# Patient Record
Sex: Female | Born: 1935 | Race: White | Hispanic: No | Smoking: Never smoker
Health system: Southern US, Community
[De-identification: ages and names within clinical notes are randomized; demographics above are authoritative.]

## PROBLEM LIST (undated history)

## (undated) DIAGNOSIS — I10 Essential (primary) hypertension: Secondary | ICD-10-CM

## (undated) DIAGNOSIS — L03116 Cellulitis of left lower limb: Secondary | ICD-10-CM

## (undated) DIAGNOSIS — C3490 Malignant neoplasm of unspecified part of unspecified bronchus or lung: Secondary | ICD-10-CM

## (undated) DIAGNOSIS — C801 Malignant (primary) neoplasm, unspecified: Secondary | ICD-10-CM

## (undated) HISTORY — DX: Essential (primary) hypertension: I10

## (undated) HISTORY — DX: Cellulitis of left lower limb: L03.116

## (undated) HISTORY — PX: OTHER SURGICAL HISTORY: SHX169

## (undated) HISTORY — DX: Malignant neoplasm of unspecified part of unspecified bronchus or lung: C34.90

## (undated) HISTORY — PX: CHOLECYSTECTOMY: SHX55

## (undated) HISTORY — PX: BREAST SURGERY: SHX581

## (undated) HISTORY — PX: ABDOMINAL HYSTERECTOMY: SHX81

---

## 1998-02-12 ENCOUNTER — Other Ambulatory Visit: Admission: RE | Admit: 1998-02-12 | Discharge: 1998-02-12 | Payer: Self-pay | Admitting: Gynecology

## 1998-08-17 ENCOUNTER — Ambulatory Visit (HOSPITAL_COMMUNITY): Admission: RE | Admit: 1998-08-17 | Discharge: 1998-08-17 | Payer: Self-pay | Admitting: Gastroenterology

## 1999-02-23 ENCOUNTER — Other Ambulatory Visit: Admission: RE | Admit: 1999-02-23 | Discharge: 1999-02-23 | Payer: Self-pay | Admitting: Gynecology

## 2000-03-09 ENCOUNTER — Other Ambulatory Visit: Admission: RE | Admit: 2000-03-09 | Discharge: 2000-03-09 | Payer: Self-pay | Admitting: Gynecology

## 2001-03-22 ENCOUNTER — Other Ambulatory Visit: Admission: RE | Admit: 2001-03-22 | Discharge: 2001-03-22 | Payer: Self-pay | Admitting: Gynecology

## 2002-03-25 ENCOUNTER — Other Ambulatory Visit: Admission: RE | Admit: 2002-03-25 | Discharge: 2002-03-25 | Payer: Self-pay | Admitting: Gynecology

## 2002-05-14 ENCOUNTER — Encounter: Admission: RE | Admit: 2002-05-14 | Discharge: 2002-05-14 | Payer: Self-pay | Admitting: Orthopedic Surgery

## 2002-05-14 ENCOUNTER — Encounter: Payer: Self-pay | Admitting: Orthopedic Surgery

## 2003-11-06 ENCOUNTER — Encounter (INDEPENDENT_AMBULATORY_CARE_PROVIDER_SITE_OTHER): Payer: Self-pay | Admitting: Specialist

## 2003-11-06 ENCOUNTER — Ambulatory Visit (HOSPITAL_COMMUNITY): Admission: RE | Admit: 2003-11-06 | Discharge: 2003-11-06 | Payer: Self-pay | Admitting: Gastroenterology

## 2004-03-29 ENCOUNTER — Other Ambulatory Visit: Admission: RE | Admit: 2004-03-29 | Discharge: 2004-03-29 | Payer: Self-pay | Admitting: Gynecology

## 2004-04-26 ENCOUNTER — Encounter: Admission: RE | Admit: 2004-04-26 | Discharge: 2004-05-25 | Payer: Self-pay | Admitting: Orthopedic Surgery

## 2004-07-15 ENCOUNTER — Emergency Department (HOSPITAL_COMMUNITY): Admission: EM | Admit: 2004-07-15 | Discharge: 2004-07-15 | Payer: Self-pay | Admitting: Emergency Medicine

## 2004-07-24 ENCOUNTER — Encounter: Admission: RE | Admit: 2004-07-24 | Discharge: 2004-07-24 | Payer: Self-pay | Admitting: Neurology

## 2004-07-29 ENCOUNTER — Encounter: Admission: RE | Admit: 2004-07-29 | Discharge: 2004-07-29 | Payer: Self-pay | Admitting: Neurology

## 2004-08-25 ENCOUNTER — Ambulatory Visit (HOSPITAL_COMMUNITY): Admission: RE | Admit: 2004-08-25 | Discharge: 2004-08-25 | Payer: Self-pay | Admitting: Family Medicine

## 2004-08-26 ENCOUNTER — Ambulatory Visit (HOSPITAL_COMMUNITY): Admission: RE | Admit: 2004-08-26 | Discharge: 2004-08-26 | Payer: Self-pay | Admitting: Interventional Radiology

## 2004-08-26 ENCOUNTER — Encounter (INDEPENDENT_AMBULATORY_CARE_PROVIDER_SITE_OTHER): Payer: Self-pay | Admitting: *Deleted

## 2004-09-02 ENCOUNTER — Ambulatory Visit: Admission: RE | Admit: 2004-09-02 | Discharge: 2004-09-24 | Payer: Self-pay | Admitting: Radiation Oncology

## 2004-09-02 ENCOUNTER — Ambulatory Visit: Payer: Self-pay | Admitting: Internal Medicine

## 2004-09-07 ENCOUNTER — Ambulatory Visit (HOSPITAL_COMMUNITY): Admission: RE | Admit: 2004-09-07 | Discharge: 2004-09-07 | Payer: Self-pay | Admitting: Internal Medicine

## 2004-10-07 ENCOUNTER — Ambulatory Visit (HOSPITAL_COMMUNITY): Admission: RE | Admit: 2004-10-07 | Discharge: 2004-10-07 | Payer: Self-pay | Admitting: Internal Medicine

## 2004-10-13 ENCOUNTER — Ambulatory Visit: Admission: RE | Admit: 2004-10-13 | Discharge: 2004-10-13 | Payer: Self-pay | Admitting: Radiation Oncology

## 2004-10-18 ENCOUNTER — Encounter (HOSPITAL_COMMUNITY): Admission: RE | Admit: 2004-10-18 | Discharge: 2004-10-18 | Payer: Self-pay | Admitting: Internal Medicine

## 2004-10-18 ENCOUNTER — Ambulatory Visit: Payer: Self-pay | Admitting: Internal Medicine

## 2004-11-10 ENCOUNTER — Ambulatory Visit (HOSPITAL_COMMUNITY): Admission: RE | Admit: 2004-11-10 | Discharge: 2004-11-10 | Payer: Self-pay | Admitting: Internal Medicine

## 2004-12-08 ENCOUNTER — Ambulatory Visit (HOSPITAL_COMMUNITY): Admission: RE | Admit: 2004-12-08 | Discharge: 2004-12-08 | Payer: Self-pay | Admitting: Internal Medicine

## 2004-12-08 ENCOUNTER — Ambulatory Visit: Payer: Self-pay | Admitting: Internal Medicine

## 2005-01-20 ENCOUNTER — Ambulatory Visit (HOSPITAL_COMMUNITY): Admission: RE | Admit: 2005-01-20 | Discharge: 2005-01-20 | Payer: Self-pay | Admitting: Internal Medicine

## 2005-01-26 ENCOUNTER — Ambulatory Visit: Payer: Self-pay | Admitting: Internal Medicine

## 2005-03-15 ENCOUNTER — Ambulatory Visit: Payer: Self-pay | Admitting: Internal Medicine

## 2005-03-17 ENCOUNTER — Ambulatory Visit (HOSPITAL_COMMUNITY): Admission: RE | Admit: 2005-03-17 | Discharge: 2005-03-17 | Payer: Self-pay | Admitting: Internal Medicine

## 2005-05-04 ENCOUNTER — Ambulatory Visit: Payer: Self-pay | Admitting: Internal Medicine

## 2005-06-17 ENCOUNTER — Ambulatory Visit (HOSPITAL_COMMUNITY): Admission: RE | Admit: 2005-06-17 | Discharge: 2005-06-17 | Payer: Self-pay | Admitting: Internal Medicine

## 2005-06-22 ENCOUNTER — Ambulatory Visit: Payer: Self-pay | Admitting: Internal Medicine

## 2005-08-25 ENCOUNTER — Ambulatory Visit: Payer: Self-pay | Admitting: Internal Medicine

## 2005-09-16 ENCOUNTER — Ambulatory Visit (HOSPITAL_COMMUNITY): Admission: RE | Admit: 2005-09-16 | Discharge: 2005-09-16 | Payer: Self-pay | Admitting: Internal Medicine

## 2005-09-22 LAB — BASIC METABOLIC PANEL
CO2: 28 mEq/L (ref 19–32)
Calcium: 8.5 mg/dL (ref 8.4–10.5)
Chloride: 105 mEq/L (ref 96–112)
Creatinine, Ser: 0.8 mg/dL (ref 0.4–1.2)
Glucose, Bld: 89 mg/dL (ref 70–99)

## 2005-09-22 LAB — CBC WITH DIFFERENTIAL/PLATELET
BASO%: 0.5 % (ref 0.0–2.0)
Eosinophils Absolute: 0 10*3/uL (ref 0.0–0.5)
HCT: 28.5 % — ABNORMAL LOW (ref 34.8–46.6)
HGB: 10.2 g/dL — ABNORMAL LOW (ref 11.6–15.9)
LYMPH%: 20.7 % (ref 14.0–48.0)
MCHC: 35.7 g/dL (ref 32.0–36.0)
MONO#: 0.4 10*3/uL (ref 0.1–0.9)
NEUT#: 2.9 10*3/uL (ref 1.5–6.5)
NEUT%: 68.3 % (ref 39.6–76.8)
Platelets: 210 10*3/uL (ref 145–400)
WBC: 4.3 10*3/uL (ref 3.9–10.0)
lymph#: 0.9 10*3/uL (ref 0.9–3.3)

## 2005-10-06 LAB — CBC WITH DIFFERENTIAL/PLATELET
Eosinophils Absolute: 0.1 10*3/uL (ref 0.0–0.5)
MONO#: 0.6 10*3/uL (ref 0.1–0.9)
MONO%: 8.7 % (ref 0.0–13.0)
NEUT#: 5 10*3/uL (ref 1.5–6.5)
RBC: 3.11 10*6/uL — ABNORMAL LOW (ref 3.70–5.32)
RDW: 15.8 % — ABNORMAL HIGH (ref 11.3–14.5)
WBC: 6.4 10*3/uL (ref 3.9–10.0)
lymph#: 0.8 10*3/uL — ABNORMAL LOW (ref 0.9–3.3)

## 2005-10-18 ENCOUNTER — Ambulatory Visit: Payer: Self-pay | Admitting: Internal Medicine

## 2005-10-20 LAB — CBC WITH DIFFERENTIAL/PLATELET
BASO%: 0.7 % (ref 0.0–2.0)
EOS%: 1.7 % (ref 0.0–7.0)
LYMPH%: 14.2 % (ref 14.0–48.0)
MCH: 33.9 pg (ref 26.0–34.0)
MCHC: 35.2 g/dL (ref 32.0–36.0)
MCV: 96.2 fL (ref 81.0–101.0)
MONO#: 0.5 10*3/uL (ref 0.1–0.9)
MONO%: 7.6 % (ref 0.0–13.0)
NEUT%: 75.8 % (ref 39.6–76.8)
Platelets: 201 10*3/uL (ref 145–400)
RBC: 3.9 10*6/uL (ref 3.70–5.32)
WBC: 6 10*3/uL (ref 3.9–10.0)

## 2005-10-20 LAB — COMPREHENSIVE METABOLIC PANEL
CO2: 23 mEq/L (ref 19–32)
Calcium: 8.6 mg/dL (ref 8.4–10.5)
Chloride: 107 mEq/L (ref 96–112)
Creatinine, Ser: 0.8 mg/dL (ref 0.4–1.2)
Glucose, Bld: 128 mg/dL — ABNORMAL HIGH (ref 70–99)
Total Bilirubin: 1 mg/dL (ref 0.3–1.2)

## 2005-11-03 LAB — CBC WITH DIFFERENTIAL/PLATELET
Eosinophils Absolute: 0.1 10*3/uL (ref 0.0–0.5)
HCT: 34.6 % — ABNORMAL LOW (ref 34.8–46.6)
LYMPH%: 14.6 % (ref 14.0–48.0)
MONO#: 0.4 10*3/uL (ref 0.1–0.9)
NEUT#: 4.9 10*3/uL (ref 1.5–6.5)
NEUT%: 76.6 % (ref 39.6–76.8)
Platelets: 213 10*3/uL (ref 145–400)
RBC: 3.63 10*6/uL — ABNORMAL LOW (ref 3.70–5.32)
WBC: 6.4 10*3/uL (ref 3.9–10.0)
lymph#: 0.9 10*3/uL (ref 0.9–3.3)

## 2005-11-17 LAB — CBC WITH DIFFERENTIAL/PLATELET
BASO%: 0.3 % (ref 0.0–2.0)
Basophils Absolute: 0 10*3/uL (ref 0.0–0.1)
HCT: 34.9 % (ref 34.8–46.6)
HGB: 11.7 g/dL (ref 11.6–15.9)
LYMPH%: 14.2 % (ref 14.0–48.0)
MCHC: 33.6 g/dL (ref 32.0–36.0)
MONO#: 0.5 10*3/uL (ref 0.1–0.9)
NEUT%: 72.7 % (ref 39.6–76.8)
Platelets: 224 10*3/uL (ref 145–400)
WBC: 5.1 10*3/uL (ref 3.9–10.0)
lymph#: 0.7 10*3/uL — ABNORMAL LOW (ref 0.9–3.3)

## 2005-11-17 LAB — COMPREHENSIVE METABOLIC PANEL
ALT: 11 U/L (ref 0–40)
BUN: 18 mg/dL (ref 6–23)
CO2: 27 mEq/L (ref 19–32)
Calcium: 8.4 mg/dL (ref 8.4–10.5)
Chloride: 105 mEq/L (ref 96–112)
Creatinine, Ser: 0.9 mg/dL (ref 0.4–1.2)
Glucose, Bld: 85 mg/dL (ref 70–99)
Total Bilirubin: 1.1 mg/dL (ref 0.3–1.2)

## 2005-12-01 LAB — CBC WITH DIFFERENTIAL/PLATELET
Basophils Absolute: 0 10*3/uL (ref 0.0–0.1)
Eosinophils Absolute: 0.1 10*3/uL (ref 0.0–0.5)
HGB: 12.9 g/dL (ref 11.6–15.9)
NEUT#: 4.2 10*3/uL (ref 1.5–6.5)
RDW: 14.7 % — ABNORMAL HIGH (ref 11.3–14.5)
WBC: 5.6 10*3/uL (ref 3.9–10.0)
lymph#: 0.9 10*3/uL (ref 0.9–3.3)

## 2005-12-12 ENCOUNTER — Ambulatory Visit: Payer: Self-pay | Admitting: Internal Medicine

## 2005-12-15 ENCOUNTER — Ambulatory Visit: Admission: RE | Admit: 2005-12-15 | Discharge: 2005-12-15 | Payer: Self-pay | Admitting: Internal Medicine

## 2005-12-15 LAB — CBC WITH DIFFERENTIAL/PLATELET
Basophils Absolute: 0.1 10*3/uL (ref 0.0–0.1)
Eosinophils Absolute: 0.1 10*3/uL (ref 0.0–0.5)
HGB: 12.1 g/dL (ref 11.6–15.9)
LYMPH%: 18.3 % (ref 14.0–48.0)
MCV: 95.6 fL (ref 81.0–101.0)
MONO#: 0.6 10*3/uL (ref 0.1–0.9)
MONO%: 11 % (ref 0.0–13.0)
NEUT#: 3.5 10*3/uL (ref 1.5–6.5)
Platelets: 155 10*3/uL (ref 145–400)
RDW: 13.4 % (ref 11.3–14.5)

## 2005-12-15 LAB — BASIC METABOLIC PANEL
BUN: 18 mg/dL (ref 6–23)
CO2: 27 mEq/L (ref 19–32)
Glucose, Bld: 80 mg/dL (ref 70–99)
Potassium: 4.1 mEq/L (ref 3.5–5.3)

## 2005-12-19 LAB — CBC WITH DIFFERENTIAL/PLATELET
Basophils Absolute: 0 10*3/uL (ref 0.0–0.1)
EOS%: 1.9 % (ref 0.0–7.0)
Eosinophils Absolute: 0.1 10*3/uL (ref 0.0–0.5)
LYMPH%: 15 % (ref 14.0–48.0)
MCH: 33.3 pg (ref 26.0–34.0)
MCV: 96 fL (ref 81.0–101.0)
MONO%: 8.3 % (ref 0.0–13.0)
NEUT#: 4.2 10*3/uL (ref 1.5–6.5)
Platelets: 201 10*3/uL (ref 145–400)
RBC: 3.53 10*6/uL — ABNORMAL LOW (ref 3.70–5.32)
RDW: 15.1 % — ABNORMAL HIGH (ref 11.3–14.5)

## 2005-12-19 LAB — COMPREHENSIVE METABOLIC PANEL
AST: 17 U/L (ref 0–37)
Alkaline Phosphatase: 63 U/L (ref 39–117)
BUN: 15 mg/dL (ref 6–23)
Glucose, Bld: 100 mg/dL — ABNORMAL HIGH (ref 70–99)
Potassium: 5.2 mEq/L (ref 3.5–5.3)
Sodium: 142 mEq/L (ref 135–145)
Total Bilirubin: 0.9 mg/dL (ref 0.3–1.2)

## 2006-01-12 LAB — CBC WITH DIFFERENTIAL/PLATELET
Basophils Absolute: 0 10*3/uL (ref 0.0–0.1)
EOS%: 1.7 % (ref 0.0–7.0)
Eosinophils Absolute: 0.1 10*3/uL (ref 0.0–0.5)
HGB: 10.4 g/dL — ABNORMAL LOW (ref 11.6–15.9)
LYMPH%: 14.6 % (ref 14.0–48.0)
MCH: 32.5 pg (ref 26.0–34.0)
MCV: 94.8 fL (ref 81.0–101.0)
MONO%: 7.1 % (ref 0.0–13.0)
NEUT#: 4.3 10*3/uL (ref 1.5–6.5)
NEUT%: 76.5 % (ref 39.6–76.8)
Platelets: 209 10*3/uL (ref 145–400)

## 2006-01-12 LAB — COMPREHENSIVE METABOLIC PANEL
Albumin: 3.9 g/dL (ref 3.5–5.2)
BUN: 21 mg/dL (ref 6–23)
Calcium: 8.6 mg/dL (ref 8.4–10.5)
Chloride: 106 mEq/L (ref 96–112)
Glucose, Bld: 103 mg/dL — ABNORMAL HIGH (ref 70–99)
Potassium: 3.5 mEq/L (ref 3.5–5.3)

## 2006-01-24 ENCOUNTER — Ambulatory Visit: Payer: Self-pay | Admitting: Internal Medicine

## 2006-02-09 LAB — CBC WITH DIFFERENTIAL/PLATELET
BASO%: 0.1 % (ref 0.0–2.0)
Basophils Absolute: 0 10*3/uL (ref 0.0–0.1)
EOS%: 1.5 % (ref 0.0–7.0)
HCT: 28.6 % — ABNORMAL LOW (ref 34.8–46.6)
HGB: 9.8 g/dL — ABNORMAL LOW (ref 11.6–15.9)
LYMPH%: 15.8 % (ref 14.0–48.0)
MCH: 33.4 pg (ref 26.0–34.0)
MCHC: 34.5 g/dL (ref 32.0–36.0)
MONO#: 0.4 10*3/uL (ref 0.1–0.9)
NEUT%: 75.1 % (ref 39.6–76.8)
Platelets: 231 10*3/uL (ref 145–400)

## 2006-02-09 LAB — COMPREHENSIVE METABOLIC PANEL
ALT: 12 U/L (ref 0–40)
Albumin: 4 g/dL (ref 3.5–5.2)
Alkaline Phosphatase: 53 U/L (ref 39–117)
Glucose, Bld: 95 mg/dL (ref 70–99)
Potassium: 3.9 mEq/L (ref 3.5–5.3)
Sodium: 142 mEq/L (ref 135–145)
Total Bilirubin: 0.9 mg/dL (ref 0.3–1.2)
Total Protein: 6.4 g/dL (ref 6.0–8.3)

## 2006-03-02 ENCOUNTER — Ambulatory Visit (HOSPITAL_COMMUNITY): Admission: RE | Admit: 2006-03-02 | Discharge: 2006-03-02 | Payer: Self-pay | Admitting: Internal Medicine

## 2006-03-09 ENCOUNTER — Ambulatory Visit: Payer: Self-pay | Admitting: Internal Medicine

## 2006-03-09 LAB — CBC WITH DIFFERENTIAL/PLATELET
BASO%: 0.1 % (ref 0.0–2.0)
EOS%: 1.9 % (ref 0.0–7.0)
LYMPH%: 13 % — ABNORMAL LOW (ref 14.0–48.0)
MCH: 34 pg (ref 26.0–34.0)
MCHC: 34.7 g/dL (ref 32.0–36.0)
MCV: 98.1 fL (ref 81.0–101.0)
MONO%: 7.5 % (ref 0.0–13.0)
NEUT#: 4.7 10*3/uL (ref 1.5–6.5)
Platelets: 227 10*3/uL (ref 145–400)
RBC: 2.85 10*6/uL — ABNORMAL LOW (ref 3.70–5.32)
RDW: 14.8 % — ABNORMAL HIGH (ref 11.3–14.5)

## 2006-03-09 LAB — COMPREHENSIVE METABOLIC PANEL
AST: 16 U/L (ref 0–37)
Albumin: 4 g/dL (ref 3.5–5.2)
Alkaline Phosphatase: 52 U/L (ref 39–117)
Potassium: 3.6 mEq/L (ref 3.5–5.3)
Sodium: 142 mEq/L (ref 135–145)
Total Bilirubin: 0.7 mg/dL (ref 0.3–1.2)
Total Protein: 6.5 g/dL (ref 6.0–8.3)

## 2006-04-06 LAB — CBC WITH DIFFERENTIAL/PLATELET
BASO%: 0.2 % (ref 0.0–2.0)
EOS%: 1.1 % (ref 0.0–7.0)
HCT: 34.3 % — ABNORMAL LOW (ref 34.8–46.6)
LYMPH%: 11.4 % — ABNORMAL LOW (ref 14.0–48.0)
MCH: 33.6 pg (ref 26.0–34.0)
MCHC: 33.6 g/dL (ref 32.0–36.0)
MCV: 99.9 fL (ref 81.0–101.0)
MONO%: 6.5 % (ref 0.0–13.0)
NEUT%: 80.8 % — ABNORMAL HIGH (ref 39.6–76.8)
Platelets: 234 10*3/uL (ref 145–400)
lymph#: 0.8 10*3/uL — ABNORMAL LOW (ref 0.9–3.3)

## 2006-04-06 LAB — COMPREHENSIVE METABOLIC PANEL
ALT: 9 U/L (ref 0–40)
AST: 15 U/L (ref 0–37)
Alkaline Phosphatase: 67 U/L (ref 39–117)
Creatinine, Ser: 0.85 mg/dL (ref 0.40–1.20)
Total Bilirubin: 1 mg/dL (ref 0.3–1.2)

## 2006-04-18 ENCOUNTER — Ambulatory Visit: Payer: Self-pay | Admitting: Internal Medicine

## 2006-04-20 LAB — CBC WITH DIFFERENTIAL/PLATELET
Basophils Absolute: 0 10*3/uL (ref 0.0–0.1)
Eosinophils Absolute: 0.1 10*3/uL (ref 0.0–0.5)
HCT: 37.7 % (ref 34.8–46.6)
HGB: 12.9 g/dL (ref 11.6–15.9)
LYMPH%: 13 % — ABNORMAL LOW (ref 14.0–48.0)
MCH: 32.8 pg (ref 26.0–34.0)
MCV: 96.2 fL (ref 81.0–101.0)
MONO%: 7.3 % (ref 0.0–13.0)
NEUT#: 4.6 10*3/uL (ref 1.5–6.5)
NEUT%: 76.8 % (ref 39.6–76.8)
Platelets: 181 10*3/uL (ref 145–400)
RDW: 13.7 % (ref 11.3–14.5)

## 2006-05-03 LAB — CBC WITH DIFFERENTIAL/PLATELET
BASO%: 0.1 % (ref 0.0–2.0)
Basophils Absolute: 0 10*3/uL (ref 0.0–0.1)
EOS%: 1.3 % (ref 0.0–7.0)
Eosinophils Absolute: 0.1 10*3/uL (ref 0.0–0.5)
HCT: 36.5 % (ref 34.8–46.6)
HGB: 12.4 g/dL (ref 11.6–15.9)
LYMPH%: 12.5 % — ABNORMAL LOW (ref 14.0–48.0)
MCH: 33.2 pg (ref 26.0–34.0)
MCHC: 34.1 g/dL (ref 32.0–36.0)
MCV: 97.3 fL (ref 81.0–101.0)
MONO#: 0.5 10*3/uL (ref 0.1–0.9)
MONO%: 7 % (ref 0.0–13.0)
NEUT#: 5.4 10*3/uL (ref 1.5–6.5)
NEUT%: 79.1 % — ABNORMAL HIGH (ref 39.6–76.8)
Platelets: 196 10*3/uL (ref 145–400)
RBC: 3.75 10*6/uL (ref 3.70–5.32)
RDW: 14.8 % — ABNORMAL HIGH (ref 11.3–14.5)
WBC: 6.8 10*3/uL (ref 3.9–10.0)
lymph#: 0.8 10*3/uL — ABNORMAL LOW (ref 0.9–3.3)

## 2006-05-03 LAB — URINALYSIS, MICROSCOPIC - CHCC
Bilirubin (Urine): NEGATIVE
Blood: NEGATIVE
Glucose: NEGATIVE g/dL
Ketones: NEGATIVE mg/dL
Nitrite: NEGATIVE
Protein: NEGATIVE mg/dL
RBC count: NEGATIVE (ref 0–2)
Specific Gravity, Urine: 1.005 (ref 1.003–1.035)
pH: 6 (ref 4.6–8.0)

## 2006-05-03 LAB — COMPREHENSIVE METABOLIC PANEL
ALT: 10 U/L (ref 0–35)
AST: 17 U/L (ref 0–37)
Albumin: 3.9 g/dL (ref 3.5–5.2)
Alkaline Phosphatase: 75 U/L (ref 39–117)
BUN: 12 mg/dL (ref 6–23)
CO2: 29 mEq/L (ref 19–32)
Calcium: 8.7 mg/dL (ref 8.4–10.5)
Chloride: 103 mEq/L (ref 96–112)
Creatinine, Ser: 0.8 mg/dL (ref 0.40–1.20)
Glucose, Bld: 101 mg/dL — ABNORMAL HIGH (ref 70–99)
Potassium: 3.7 mEq/L (ref 3.5–5.3)
Sodium: 140 mEq/L (ref 135–145)
Total Bilirubin: 0.9 mg/dL (ref 0.3–1.2)
Total Protein: 6.6 g/dL (ref 6.0–8.3)

## 2006-05-18 LAB — CBC WITH DIFFERENTIAL/PLATELET
BASO%: 1.2 % (ref 0.0–2.0)
HCT: 35.9 % (ref 34.8–46.6)
LYMPH%: 16.1 % (ref 14.0–48.0)
MCHC: 34.3 g/dL (ref 32.0–36.0)
MONO#: 0.6 10*3/uL (ref 0.1–0.9)
NEUT%: 71.6 % (ref 39.6–76.8)
Platelets: 166 10*3/uL (ref 145–400)
WBC: 6.6 10*3/uL (ref 3.9–10.0)

## 2006-06-02 ENCOUNTER — Ambulatory Visit: Payer: Self-pay | Admitting: Internal Medicine

## 2006-06-05 ENCOUNTER — Ambulatory Visit (HOSPITAL_COMMUNITY): Admission: RE | Admit: 2006-06-05 | Discharge: 2006-06-05 | Payer: Self-pay | Admitting: Internal Medicine

## 2006-06-07 LAB — COMPREHENSIVE METABOLIC PANEL
AST: 16 U/L (ref 0–37)
Alkaline Phosphatase: 72 U/L (ref 39–117)
BUN: 14 mg/dL (ref 6–23)
Creatinine, Ser: 0.82 mg/dL (ref 0.40–1.20)
Glucose, Bld: 105 mg/dL — ABNORMAL HIGH (ref 70–99)
Total Bilirubin: 0.6 mg/dL (ref 0.3–1.2)

## 2006-06-07 LAB — CBC WITH DIFFERENTIAL/PLATELET
Basophils Absolute: 0 10*3/uL (ref 0.0–0.1)
EOS%: 1.6 % (ref 0.0–7.0)
Eosinophils Absolute: 0.1 10*3/uL (ref 0.0–0.5)
HGB: 10.6 g/dL — ABNORMAL LOW (ref 11.6–15.9)
LYMPH%: 17 % (ref 14.0–48.0)
MCH: 32.3 pg (ref 26.0–34.0)
MCV: 95.2 fL (ref 81.0–101.0)
MONO%: 7.5 % (ref 0.0–13.0)
NEUT#: 3.7 10*3/uL (ref 1.5–6.5)
Platelets: 231 10*3/uL (ref 145–400)
RDW: 14.9 % — ABNORMAL HIGH (ref 11.3–14.5)

## 2006-07-05 LAB — COMPREHENSIVE METABOLIC PANEL
Albumin: 4.3 g/dL (ref 3.5–5.2)
CO2: 28 mEq/L (ref 19–32)
Calcium: 8.8 mg/dL (ref 8.4–10.5)
Glucose, Bld: 94 mg/dL (ref 70–99)
Sodium: 143 mEq/L (ref 135–145)
Total Bilirubin: 1.2 mg/dL (ref 0.3–1.2)
Total Protein: 6.9 g/dL (ref 6.0–8.3)

## 2006-07-05 LAB — CBC WITH DIFFERENTIAL/PLATELET
Eosinophils Absolute: 0.1 10*3/uL (ref 0.0–0.5)
HCT: 31.9 % — ABNORMAL LOW (ref 34.8–46.6)
LYMPH%: 14.7 % (ref 14.0–48.0)
MONO#: 0.5 10*3/uL (ref 0.1–0.9)
NEUT#: 4.2 10*3/uL (ref 1.5–6.5)
Platelets: 233 10*3/uL (ref 145–400)
RBC: 3.33 10*6/uL — ABNORMAL LOW (ref 3.70–5.32)
WBC: 5.6 10*3/uL (ref 3.9–10.0)

## 2006-07-18 ENCOUNTER — Ambulatory Visit: Payer: Self-pay | Admitting: Internal Medicine

## 2006-08-09 LAB — CBC WITH DIFFERENTIAL/PLATELET
Eosinophils Absolute: 0.1 10*3/uL (ref 0.0–0.5)
MONO#: 0.4 10*3/uL (ref 0.1–0.9)
NEUT#: 4.1 10*3/uL (ref 1.5–6.5)
Platelets: 225 10*3/uL (ref 145–400)
RBC: 3.11 10*6/uL — ABNORMAL LOW (ref 3.70–5.32)
RDW: 14.9 % — ABNORMAL HIGH (ref 11.3–14.5)
WBC: 5.5 10*3/uL (ref 3.9–10.0)
lymph#: 0.9 10*3/uL (ref 0.9–3.3)

## 2006-08-09 LAB — COMPREHENSIVE METABOLIC PANEL
ALT: 14 U/L (ref 0–35)
Albumin: 4.1 g/dL (ref 3.5–5.2)
CO2: 28 mEq/L (ref 19–32)
Chloride: 108 mEq/L (ref 96–112)
Glucose, Bld: 95 mg/dL (ref 70–99)
Potassium: 3.2 mEq/L — ABNORMAL LOW (ref 3.5–5.3)
Sodium: 145 mEq/L (ref 135–145)
Total Protein: 6.7 g/dL (ref 6.0–8.3)

## 2006-08-22 ENCOUNTER — Ambulatory Visit (HOSPITAL_COMMUNITY): Admission: RE | Admit: 2006-08-22 | Discharge: 2006-08-22 | Payer: Self-pay | Admitting: Internal Medicine

## 2006-08-24 LAB — COMPREHENSIVE METABOLIC PANEL
ALT: 11 U/L (ref 0–35)
AST: 16 U/L (ref 0–37)
CO2: 26 mEq/L (ref 19–32)
Chloride: 106 mEq/L (ref 96–112)
Sodium: 143 mEq/L (ref 135–145)
Total Bilirubin: 0.9 mg/dL (ref 0.3–1.2)
Total Protein: 7 g/dL (ref 6.0–8.3)

## 2006-08-24 LAB — CBC WITH DIFFERENTIAL/PLATELET
BASO%: 0.4 % (ref 0.0–2.0)
LYMPH%: 17.1 % (ref 14.0–48.0)
MCHC: 35.2 g/dL (ref 32.0–36.0)
MONO#: 0.4 10*3/uL (ref 0.1–0.9)
RBC: 3.18 10*6/uL — ABNORMAL LOW (ref 3.70–5.32)
WBC: 5.1 10*3/uL (ref 3.9–10.0)
lymph#: 0.9 10*3/uL (ref 0.9–3.3)

## 2006-09-18 ENCOUNTER — Ambulatory Visit: Payer: Self-pay | Admitting: Internal Medicine

## 2006-09-21 LAB — CBC WITH DIFFERENTIAL/PLATELET
BASO%: 0.3 % (ref 0.0–2.0)
Basophils Absolute: 0 10*3/uL (ref 0.0–0.1)
EOS%: 2.5 % (ref 0.0–7.0)
Eosinophils Absolute: 0.1 10*3/uL (ref 0.0–0.5)
HCT: 31 % — ABNORMAL LOW (ref 34.8–46.6)
HGB: 10.9 g/dL — ABNORMAL LOW (ref 11.6–15.9)
LYMPH%: 22.8 % (ref 14.0–48.0)
MCH: 34 pg (ref 26.0–34.0)
MCHC: 35.3 g/dL (ref 32.0–36.0)
MCV: 96.4 fL (ref 81.0–101.0)
MONO#: 0.4 10*3/uL (ref 0.1–0.9)
MONO%: 9.2 % (ref 0.0–13.0)
NEUT#: 3 10*3/uL (ref 1.5–6.5)
NEUT%: 65.2 % (ref 39.6–76.8)
Platelets: 232 10*3/uL (ref 145–400)
RBC: 3.22 10*6/uL — ABNORMAL LOW (ref 3.70–5.32)
RDW: 13.5 % (ref 11.3–14.5)
WBC: 4.5 10*3/uL (ref 3.9–10.0)
lymph#: 1 10*3/uL (ref 0.9–3.3)

## 2006-09-21 LAB — COMPREHENSIVE METABOLIC PANEL
ALT: 12 U/L (ref 0–35)
AST: 16 U/L (ref 0–37)
Albumin: 3.9 g/dL (ref 3.5–5.2)
Alkaline Phosphatase: 72 U/L (ref 39–117)
BUN: 23 mg/dL (ref 6–23)
CO2: 26 mEq/L (ref 19–32)
Calcium: 8.4 mg/dL (ref 8.4–10.5)
Chloride: 106 mEq/L (ref 96–112)
Creatinine, Ser: 0.89 mg/dL (ref 0.40–1.20)
Glucose, Bld: 90 mg/dL (ref 70–99)
Potassium: 3.4 mEq/L — ABNORMAL LOW (ref 3.5–5.3)
Sodium: 142 mEq/L (ref 135–145)
Total Bilirubin: 0.7 mg/dL (ref 0.3–1.2)
Total Protein: 6.5 g/dL (ref 6.0–8.3)

## 2006-10-19 LAB — CBC WITH DIFFERENTIAL/PLATELET
BASO%: 0.2 % (ref 0.0–2.0)
HCT: 31.1 % — ABNORMAL LOW (ref 34.8–46.6)
LYMPH%: 17.8 % (ref 14.0–48.0)
MCHC: 35.2 g/dL (ref 32.0–36.0)
MCV: 96.5 fL (ref 81.0–101.0)
MONO%: 7.6 % (ref 0.0–13.0)
NEUT%: 72.1 % (ref 39.6–76.8)
Platelets: 224 10*3/uL (ref 145–400)
RBC: 3.23 10*6/uL — ABNORMAL LOW (ref 3.70–5.32)

## 2006-10-19 LAB — COMPREHENSIVE METABOLIC PANEL
ALT: 10 U/L (ref 0–35)
Alkaline Phosphatase: 75 U/L (ref 39–117)
Creatinine, Ser: 0.81 mg/dL (ref 0.40–1.20)
Glucose, Bld: 83 mg/dL (ref 70–99)
Sodium: 143 mEq/L (ref 135–145)
Total Bilirubin: 0.7 mg/dL (ref 0.3–1.2)
Total Protein: 6.5 g/dL (ref 6.0–8.3)

## 2006-11-10 ENCOUNTER — Ambulatory Visit (HOSPITAL_COMMUNITY): Admission: RE | Admit: 2006-11-10 | Discharge: 2006-11-10 | Payer: Self-pay | Admitting: Internal Medicine

## 2006-11-14 ENCOUNTER — Ambulatory Visit: Payer: Self-pay | Admitting: Internal Medicine

## 2006-11-16 LAB — CBC WITH DIFFERENTIAL/PLATELET
BASO%: 0.2 % (ref 0.0–2.0)
EOS%: 1.7 % (ref 0.0–7.0)
HCT: 30.5 % — ABNORMAL LOW (ref 34.8–46.6)
LYMPH%: 15.1 % (ref 14.0–48.0)
MCH: 34 pg (ref 26.0–34.0)
MCHC: 35.2 g/dL (ref 32.0–36.0)
MCV: 96.7 fL (ref 81.0–101.0)
NEUT%: 76.7 % (ref 39.6–76.8)
Platelets: 222 10*3/uL (ref 145–400)

## 2006-11-16 LAB — COMPREHENSIVE METABOLIC PANEL
ALT: 11 U/L (ref 0–35)
AST: 16 U/L (ref 0–37)
CO2: 27 mEq/L (ref 19–32)
Creatinine, Ser: 0.88 mg/dL (ref 0.40–1.20)
Total Bilirubin: 0.7 mg/dL (ref 0.3–1.2)

## 2006-12-14 LAB — COMPREHENSIVE METABOLIC PANEL
ALT: 11 U/L (ref 0–35)
BUN: 18 mg/dL (ref 6–23)
CO2: 26 mEq/L (ref 19–32)
Calcium: 8.5 mg/dL (ref 8.4–10.5)
Creatinine, Ser: 0.79 mg/dL (ref 0.40–1.20)
Glucose, Bld: 108 mg/dL — ABNORMAL HIGH (ref 70–99)
Total Bilirubin: 0.8 mg/dL (ref 0.3–1.2)

## 2006-12-14 LAB — CBC WITH DIFFERENTIAL/PLATELET
BASO%: 0.8 % (ref 0.0–2.0)
Basophils Absolute: 0 10*3/uL (ref 0.0–0.1)
HCT: 31.5 % — ABNORMAL LOW (ref 34.8–46.6)
HGB: 11 g/dL — ABNORMAL LOW (ref 11.6–15.9)
LYMPH%: 16.9 % (ref 14.0–48.0)
MCHC: 34.8 g/dL (ref 32.0–36.0)
MONO#: 0.5 10*3/uL (ref 0.1–0.9)
NEUT%: 71.5 % (ref 39.6–76.8)
Platelets: 208 10*3/uL (ref 145–400)
WBC: 5.7 10*3/uL (ref 3.9–10.0)

## 2007-01-08 ENCOUNTER — Ambulatory Visit: Payer: Self-pay | Admitting: Internal Medicine

## 2007-01-11 LAB — COMPREHENSIVE METABOLIC PANEL
Albumin: 4 g/dL (ref 3.5–5.2)
Alkaline Phosphatase: 74 U/L (ref 39–117)
Calcium: 8.1 mg/dL — ABNORMAL LOW (ref 8.4–10.5)
Chloride: 105 mEq/L (ref 96–112)
Glucose, Bld: 92 mg/dL (ref 70–99)
Potassium: 4.5 mEq/L (ref 3.5–5.3)
Sodium: 139 mEq/L (ref 135–145)
Total Protein: 6.7 g/dL (ref 6.0–8.3)

## 2007-01-11 LAB — CBC WITH DIFFERENTIAL/PLATELET
Basophils Absolute: 0 10*3/uL (ref 0.0–0.1)
Eosinophils Absolute: 0.1 10*3/uL (ref 0.0–0.5)
HCT: 31.2 % — ABNORMAL LOW (ref 34.8–46.6)
HGB: 10.7 g/dL — ABNORMAL LOW (ref 11.6–15.9)
LYMPH%: 16.3 % (ref 14.0–48.0)
MONO#: 0.4 10*3/uL (ref 0.1–0.9)
NEUT#: 4.1 10*3/uL (ref 1.5–6.5)
NEUT%: 73.4 % (ref 39.6–76.8)
Platelets: 198 10*3/uL (ref 145–400)
WBC: 5.6 10*3/uL (ref 3.9–10.0)
lymph#: 0.9 10*3/uL (ref 0.9–3.3)

## 2007-02-05 ENCOUNTER — Ambulatory Visit (HOSPITAL_COMMUNITY): Admission: RE | Admit: 2007-02-05 | Discharge: 2007-02-05 | Payer: Self-pay | Admitting: Internal Medicine

## 2007-02-08 LAB — BASIC METABOLIC PANEL
BUN: 19 mg/dL (ref 6–23)
Creatinine, Ser: 0.78 mg/dL (ref 0.40–1.20)
Potassium: 4.4 mEq/L (ref 3.5–5.3)

## 2007-03-06 ENCOUNTER — Ambulatory Visit: Payer: Self-pay | Admitting: Internal Medicine

## 2007-03-08 LAB — CBC WITH DIFFERENTIAL/PLATELET
Eosinophils Absolute: 0.1 10*3/uL (ref 0.0–0.5)
HGB: 10.8 g/dL — ABNORMAL LOW (ref 11.6–15.9)
MONO#: 0.5 10*3/uL (ref 0.1–0.9)
NEUT#: 3.7 10*3/uL (ref 1.5–6.5)
Platelets: 201 10*3/uL (ref 145–400)
RBC: 3.27 10*6/uL — ABNORMAL LOW (ref 3.70–5.32)
RDW: 11.4 % (ref 11.3–14.5)
WBC: 5.2 10*3/uL (ref 3.9–10.0)

## 2007-03-08 LAB — COMPREHENSIVE METABOLIC PANEL
Albumin: 4 g/dL (ref 3.5–5.2)
CO2: 23 mEq/L (ref 19–32)
Calcium: 8.3 mg/dL — ABNORMAL LOW (ref 8.4–10.5)
Glucose, Bld: 87 mg/dL (ref 70–99)
Potassium: 4.1 mEq/L (ref 3.5–5.3)
Sodium: 141 mEq/L (ref 135–145)
Total Protein: 6.5 g/dL (ref 6.0–8.3)

## 2007-04-05 LAB — CBC WITH DIFFERENTIAL/PLATELET
BASO%: 0.6 % (ref 0.0–2.0)
Basophils Absolute: 0 10*3/uL (ref 0.0–0.1)
EOS%: 1.6 % (ref 0.0–7.0)
LYMPH%: 20.8 % (ref 14.0–48.0)
MCH: 33.4 pg (ref 26.0–34.0)
MCHC: 35.3 g/dL (ref 32.0–36.0)
NEUT#: 3.4 10*3/uL (ref 1.5–6.5)
NEUT%: 68.8 % (ref 39.6–76.8)
WBC: 4.9 10*3/uL (ref 3.9–10.0)
lymph#: 1 10*3/uL (ref 0.9–3.3)

## 2007-04-05 LAB — COMPREHENSIVE METABOLIC PANEL
ALT: 9 U/L (ref 0–35)
AST: 15 U/L (ref 0–37)
BUN: 19 mg/dL (ref 6–23)
CO2: 24 mEq/L (ref 19–32)
Calcium: 8.4 mg/dL (ref 8.4–10.5)
Chloride: 108 mEq/L (ref 96–112)
Creatinine, Ser: 0.76 mg/dL (ref 0.40–1.20)
Total Bilirubin: 0.7 mg/dL (ref 0.3–1.2)

## 2007-04-17 ENCOUNTER — Ambulatory Visit: Payer: Self-pay | Admitting: Internal Medicine

## 2007-04-19 LAB — CBC WITH DIFFERENTIAL/PLATELET
BASO%: 0.1 % (ref 0.0–2.0)
Eosinophils Absolute: 0.1 10*3/uL (ref 0.0–0.5)
LYMPH%: 16.6 % (ref 14.0–48.0)
MCHC: 34.4 g/dL (ref 32.0–36.0)
MCV: 97.8 fL (ref 81.0–101.0)
MONO%: 9.5 % (ref 0.0–13.0)
NEUT#: 3.2 10*3/uL (ref 1.5–6.5)
RBC: 3.27 10*6/uL — ABNORMAL LOW (ref 3.70–5.32)
RDW: 15.2 % — ABNORMAL HIGH (ref 11.3–14.5)
WBC: 4.5 10*3/uL (ref 3.9–10.0)

## 2007-04-27 ENCOUNTER — Ambulatory Visit (HOSPITAL_COMMUNITY): Admission: RE | Admit: 2007-04-27 | Discharge: 2007-04-27 | Payer: Self-pay | Admitting: Internal Medicine

## 2007-05-03 LAB — COMPREHENSIVE METABOLIC PANEL
ALT: 10 U/L (ref 0–35)
Albumin: 3.9 g/dL (ref 3.5–5.2)
CO2: 26 mEq/L (ref 19–32)
Calcium: 8.4 mg/dL (ref 8.4–10.5)
Chloride: 107 mEq/L (ref 96–112)
Glucose, Bld: 93 mg/dL (ref 70–99)
Potassium: 4.1 mEq/L (ref 3.5–5.3)
Sodium: 141 mEq/L (ref 135–145)
Total Bilirubin: 1 mg/dL (ref 0.3–1.2)
Total Protein: 6.5 g/dL (ref 6.0–8.3)

## 2007-05-03 LAB — CBC WITH DIFFERENTIAL/PLATELET
BASO%: 0.1 % (ref 0.0–2.0)
Eosinophils Absolute: 0.1 10*3/uL (ref 0.0–0.5)
LYMPH%: 17.3 % (ref 14.0–48.0)
MCHC: 34.5 g/dL (ref 32.0–36.0)
MONO#: 0.4 10*3/uL (ref 0.1–0.9)
NEUT#: 4.1 10*3/uL (ref 1.5–6.5)
Platelets: 254 10*3/uL (ref 145–400)
RBC: 3.52 10*6/uL — ABNORMAL LOW (ref 3.70–5.32)
RDW: 16.5 % — ABNORMAL HIGH (ref 11.3–14.5)
WBC: 5.6 10*3/uL (ref 3.9–10.0)
lymph#: 1 10*3/uL (ref 0.9–3.3)

## 2007-05-18 LAB — CBC WITH DIFFERENTIAL/PLATELET
BASO%: 0.2 % (ref 0.0–2.0)
Eosinophils Absolute: 0.1 10*3/uL (ref 0.0–0.5)
LYMPH%: 18 % (ref 14.0–48.0)
MCHC: 34.4 g/dL (ref 32.0–36.0)
MONO#: 0.4 10*3/uL (ref 0.1–0.9)
NEUT#: 4.1 10*3/uL (ref 1.5–6.5)
Platelets: 204 10*3/uL (ref 145–400)
RBC: 3.48 10*6/uL — ABNORMAL LOW (ref 3.70–5.32)
RDW: 15.2 % — ABNORMAL HIGH (ref 11.3–14.5)
WBC: 5.7 10*3/uL (ref 3.9–10.0)

## 2007-05-29 ENCOUNTER — Ambulatory Visit: Payer: Self-pay | Admitting: Internal Medicine

## 2007-05-31 LAB — CBC WITH DIFFERENTIAL/PLATELET
BASO%: 0.1 % (ref 0.0–2.0)
Eosinophils Absolute: 0.1 10*3/uL (ref 0.0–0.5)
HCT: 31.7 % — ABNORMAL LOW (ref 34.8–46.6)
MCHC: 34.8 g/dL (ref 32.0–36.0)
MONO#: 0.4 10*3/uL (ref 0.1–0.9)
NEUT#: 3.8 10*3/uL (ref 1.5–6.5)
RBC: 3.28 10*6/uL — ABNORMAL LOW (ref 3.70–5.32)
WBC: 5.3 10*3/uL (ref 3.9–10.0)
lymph#: 1 10*3/uL (ref 0.9–3.3)

## 2007-05-31 LAB — COMPREHENSIVE METABOLIC PANEL
ALT: 11 U/L (ref 0–35)
AST: 18 U/L (ref 0–37)
CO2: 27 mEq/L (ref 19–32)
Calcium: 8.2 mg/dL — ABNORMAL LOW (ref 8.4–10.5)
Chloride: 107 mEq/L (ref 96–112)
Creatinine, Ser: 0.79 mg/dL (ref 0.40–1.20)
Potassium: 3.7 mEq/L (ref 3.5–5.3)
Sodium: 143 mEq/L (ref 135–145)
Total Protein: 6.3 g/dL (ref 6.0–8.3)

## 2007-07-05 LAB — CBC WITH DIFFERENTIAL/PLATELET
BASO%: 0.1 % (ref 0.0–2.0)
EOS%: 1.1 % (ref 0.0–7.0)
HCT: 32.8 % — ABNORMAL LOW (ref 34.8–46.6)
LYMPH%: 17.3 % (ref 14.0–48.0)
MCH: 32.8 pg (ref 26.0–34.0)
MCHC: 33.7 g/dL (ref 32.0–36.0)
NEUT%: 74.7 % (ref 39.6–76.8)
RBC: 3.38 10*6/uL — ABNORMAL LOW (ref 3.70–5.32)
lymph#: 0.9 10*3/uL (ref 0.9–3.3)

## 2007-07-05 LAB — COMPREHENSIVE METABOLIC PANEL
ALT: 9 U/L (ref 0–35)
AST: 16 U/L (ref 0–37)
Chloride: 105 mEq/L (ref 96–112)
Creatinine, Ser: 0.83 mg/dL (ref 0.40–1.20)
Sodium: 142 mEq/L (ref 135–145)
Total Bilirubin: 0.7 mg/dL (ref 0.3–1.2)
Total Protein: 6.7 g/dL (ref 6.0–8.3)

## 2007-07-19 ENCOUNTER — Ambulatory Visit: Payer: Self-pay | Admitting: Surgery

## 2007-07-19 ENCOUNTER — Ambulatory Visit (HOSPITAL_COMMUNITY): Admission: RE | Admit: 2007-07-19 | Discharge: 2007-07-19 | Payer: Self-pay | Admitting: Family Medicine

## 2007-07-19 ENCOUNTER — Encounter: Payer: Self-pay | Admitting: Family Medicine

## 2007-07-19 ENCOUNTER — Ambulatory Visit: Admission: RE | Admit: 2007-07-19 | Discharge: 2007-07-19 | Payer: Self-pay | Admitting: Family Medicine

## 2007-07-31 ENCOUNTER — Ambulatory Visit (HOSPITAL_COMMUNITY): Admission: RE | Admit: 2007-07-31 | Discharge: 2007-07-31 | Payer: Self-pay | Admitting: Internal Medicine

## 2007-08-02 ENCOUNTER — Ambulatory Visit: Payer: Self-pay | Admitting: Internal Medicine

## 2007-08-06 LAB — CBC WITH DIFFERENTIAL/PLATELET
BASO%: 0.9 % (ref 0.0–2.0)
EOS%: 1.4 % (ref 0.0–7.0)
HCT: 31.6 % — ABNORMAL LOW (ref 34.8–46.6)
MCHC: 35.1 g/dL (ref 32.0–36.0)
MONO#: 0.3 10*3/uL (ref 0.1–0.9)
NEUT%: 68.9 % (ref 39.6–76.8)
RDW: 14.5 % (ref 11.3–14.5)
WBC: 4.4 10*3/uL (ref 3.9–10.0)
lymph#: 1 10*3/uL (ref 0.9–3.3)

## 2007-08-06 LAB — COMPREHENSIVE METABOLIC PANEL
ALT: 12 U/L (ref 0–35)
AST: 17 U/L (ref 0–37)
Albumin: 4.2 g/dL (ref 3.5–5.2)
CO2: 27 mEq/L (ref 19–32)
Calcium: 9 mg/dL (ref 8.4–10.5)
Chloride: 105 mEq/L (ref 96–112)
Creatinine, Ser: 0.89 mg/dL (ref 0.40–1.20)
Potassium: 4.3 mEq/L (ref 3.5–5.3)
Sodium: 141 mEq/L (ref 135–145)
Total Protein: 6.9 g/dL (ref 6.0–8.3)

## 2007-09-05 LAB — CBC WITH DIFFERENTIAL/PLATELET
BASO%: 0.5 % (ref 0.0–2.0)
Basophils Absolute: 0 10*3/uL (ref 0.0–0.1)
HCT: 30.1 % — ABNORMAL LOW (ref 34.8–46.6)
HGB: 10.7 g/dL — ABNORMAL LOW (ref 11.6–15.9)
MONO#: 0.4 10*3/uL (ref 0.1–0.9)
NEUT%: 66.7 % (ref 39.6–76.8)
WBC: 5.3 10*3/uL (ref 3.9–10.0)
lymph#: 1.2 10*3/uL (ref 0.9–3.3)

## 2007-09-05 LAB — COMPREHENSIVE METABOLIC PANEL
ALT: 12 U/L (ref 0–35)
Albumin: 4 g/dL (ref 3.5–5.2)
BUN: 22 mg/dL (ref 6–23)
CO2: 25 mEq/L (ref 19–32)
Calcium: 8.7 mg/dL (ref 8.4–10.5)
Chloride: 106 mEq/L (ref 96–112)
Creatinine, Ser: 0.74 mg/dL (ref 0.40–1.20)
Potassium: 4.1 mEq/L (ref 3.5–5.3)

## 2007-09-27 ENCOUNTER — Ambulatory Visit: Payer: Self-pay | Admitting: Internal Medicine

## 2007-10-03 LAB — COMPREHENSIVE METABOLIC PANEL
ALT: 11 U/L (ref 0–35)
AST: 15 U/L (ref 0–37)
Albumin: 3.9 g/dL (ref 3.5–5.2)
Alkaline Phosphatase: 61 U/L (ref 39–117)
BUN: 21 mg/dL (ref 6–23)
CO2: 26 mEq/L (ref 19–32)
Calcium: 8.4 mg/dL (ref 8.4–10.5)
Chloride: 107 mEq/L (ref 96–112)
Creatinine, Ser: 0.84 mg/dL (ref 0.40–1.20)
Glucose, Bld: 94 mg/dL (ref 70–99)
Potassium: 3.7 mEq/L (ref 3.5–5.3)
Sodium: 144 mEq/L (ref 135–145)
Total Bilirubin: 0.6 mg/dL (ref 0.3–1.2)
Total Protein: 6.5 g/dL (ref 6.0–8.3)

## 2007-10-03 LAB — CBC WITH DIFFERENTIAL/PLATELET
BASO%: 0.2 % (ref 0.0–2.0)
LYMPH%: 17.3 % (ref 14.0–48.0)
MCHC: 34.9 g/dL (ref 32.0–36.0)
MONO#: 0.4 10*3/uL (ref 0.1–0.9)
Platelets: 225 10*3/uL (ref 145–400)
RBC: 3.15 10*6/uL — ABNORMAL LOW (ref 3.70–5.32)
WBC: 5.5 10*3/uL (ref 3.9–10.0)

## 2007-10-25 ENCOUNTER — Ambulatory Visit (HOSPITAL_COMMUNITY): Admission: RE | Admit: 2007-10-25 | Discharge: 2007-10-25 | Payer: Self-pay | Admitting: Internal Medicine

## 2007-11-01 LAB — CBC WITH DIFFERENTIAL/PLATELET
BASO%: 0.7 % (ref 0.0–2.0)
Eosinophils Absolute: 0.1 10*3/uL (ref 0.0–0.5)
LYMPH%: 13.2 % — ABNORMAL LOW (ref 14.0–48.0)
MCH: 33.9 pg (ref 26.0–34.0)
MCHC: 34.2 g/dL (ref 32.0–36.0)
MCV: 98.9 fL (ref 81.0–101.0)
MONO%: 6.8 % (ref 0.0–13.0)
Platelets: 177 10*3/uL (ref 145–400)
RBC: 3.42 10*6/uL — ABNORMAL LOW (ref 3.70–5.32)

## 2007-12-03 ENCOUNTER — Ambulatory Visit: Payer: Self-pay | Admitting: Internal Medicine

## 2007-12-05 LAB — CBC WITH DIFFERENTIAL/PLATELET
Basophils Absolute: 0 10*3/uL (ref 0.0–0.1)
Eosinophils Absolute: 0.1 10*3/uL (ref 0.0–0.5)
HGB: 10.7 g/dL — ABNORMAL LOW (ref 11.6–15.9)
LYMPH%: 20.8 % (ref 14.0–48.0)
MCV: 96.6 fL (ref 81.0–101.0)
MONO%: 10 % (ref 0.0–13.0)
NEUT#: 3.5 10*3/uL (ref 1.5–6.5)
NEUT%: 66.5 % (ref 39.6–76.8)
Platelets: 196 10*3/uL (ref 145–400)

## 2007-12-05 LAB — COMPREHENSIVE METABOLIC PANEL
AST: 17 U/L (ref 0–37)
Alkaline Phosphatase: 56 U/L (ref 39–117)
BUN: 21 mg/dL (ref 6–23)
Glucose, Bld: 95 mg/dL (ref 70–99)
Sodium: 137 mEq/L (ref 135–145)
Total Bilirubin: 0.5 mg/dL (ref 0.3–1.2)
Total Protein: 6.4 g/dL (ref 6.0–8.3)

## 2008-01-03 LAB — CBC WITH DIFFERENTIAL/PLATELET
BASO%: 0.2 % (ref 0.0–2.0)
Eosinophils Absolute: 0.1 10*3/uL (ref 0.0–0.5)
LYMPH%: 15.5 % (ref 14.0–48.0)
MCHC: 34.3 g/dL (ref 32.0–36.0)
MONO#: 0.3 10*3/uL (ref 0.1–0.9)
NEUT#: 4 10*3/uL (ref 1.5–6.5)
Platelets: 238 10*3/uL (ref 145–400)
RBC: 3.1 10*6/uL — ABNORMAL LOW (ref 3.70–5.32)
RDW: 14.5 % (ref 11.3–14.5)
WBC: 5.3 10*3/uL (ref 3.9–10.0)
lymph#: 0.8 10*3/uL — ABNORMAL LOW (ref 0.9–3.3)

## 2008-01-03 LAB — COMPREHENSIVE METABOLIC PANEL
ALT: 12 U/L (ref 0–35)
Albumin: 3.8 g/dL (ref 3.5–5.2)
CO2: 27 mEq/L (ref 19–32)
Potassium: 4 mEq/L (ref 3.5–5.3)
Sodium: 143 mEq/L (ref 135–145)
Total Bilirubin: 0.8 mg/dL (ref 0.3–1.2)
Total Protein: 6.4 g/dL (ref 6.0–8.3)

## 2008-01-18 ENCOUNTER — Ambulatory Visit: Payer: Self-pay | Admitting: Internal Medicine

## 2008-01-29 ENCOUNTER — Ambulatory Visit (HOSPITAL_COMMUNITY): Admission: RE | Admit: 2008-01-29 | Discharge: 2008-01-29 | Payer: Self-pay | Admitting: Internal Medicine

## 2008-03-03 LAB — COMPREHENSIVE METABOLIC PANEL
AST: 18 U/L (ref 0–37)
Albumin: 4 g/dL (ref 3.5–5.2)
BUN: 17 mg/dL (ref 6–23)
CO2: 28 mEq/L (ref 19–32)
Calcium: 8.9 mg/dL (ref 8.4–10.5)
Chloride: 105 mEq/L (ref 96–112)
Creatinine, Ser: 0.94 mg/dL (ref 0.40–1.20)
Potassium: 3.8 mEq/L (ref 3.5–5.3)

## 2008-03-03 LAB — CBC WITH DIFFERENTIAL/PLATELET
Basophils Absolute: 0 10*3/uL (ref 0.0–0.1)
Eosinophils Absolute: 0.1 10*3/uL (ref 0.0–0.5)
HCT: 31.1 % — ABNORMAL LOW (ref 34.8–46.6)
HGB: 10.7 g/dL — ABNORMAL LOW (ref 11.6–15.9)
MCH: 33.8 pg (ref 26.0–34.0)
MONO#: 0.4 10*3/uL (ref 0.1–0.9)
NEUT#: 5.3 10*3/uL (ref 1.5–6.5)
NEUT%: 78.3 % — ABNORMAL HIGH (ref 39.6–76.8)
RDW: 13.9 % (ref 11.3–14.5)
WBC: 6.8 10*3/uL (ref 3.9–10.0)
lymph#: 1 10*3/uL (ref 0.9–3.3)

## 2008-03-14 ENCOUNTER — Ambulatory Visit: Payer: Self-pay | Admitting: Internal Medicine

## 2008-03-31 LAB — COMPREHENSIVE METABOLIC PANEL
AST: 19 U/L (ref 0–37)
Albumin: 3.8 g/dL (ref 3.5–5.2)
BUN: 20 mg/dL (ref 6–23)
Calcium: 8.7 mg/dL (ref 8.4–10.5)
Chloride: 106 mEq/L (ref 96–112)
Creatinine, Ser: 0.96 mg/dL (ref 0.40–1.20)
Glucose, Bld: 90 mg/dL (ref 70–99)

## 2008-03-31 LAB — CBC WITH DIFFERENTIAL/PLATELET
Basophils Absolute: 0 10*3/uL (ref 0.0–0.1)
EOS%: 1.7 % (ref 0.0–7.0)
Eosinophils Absolute: 0.1 10*3/uL (ref 0.0–0.5)
HCT: 30.2 % — ABNORMAL LOW (ref 34.8–46.6)
HGB: 10.4 g/dL — ABNORMAL LOW (ref 11.6–15.9)
MCH: 33.4 pg (ref 26.0–34.0)
MCV: 97.2 fL (ref 81.0–101.0)
NEUT#: 3.6 10*3/uL (ref 1.5–6.5)
NEUT%: 75.9 % (ref 39.6–76.8)
lymph#: 0.7 10*3/uL — ABNORMAL LOW (ref 0.9–3.3)

## 2008-04-28 ENCOUNTER — Ambulatory Visit (HOSPITAL_COMMUNITY): Admission: RE | Admit: 2008-04-28 | Discharge: 2008-04-28 | Payer: Self-pay | Admitting: Internal Medicine

## 2008-04-29 ENCOUNTER — Ambulatory Visit: Payer: Self-pay | Admitting: Internal Medicine

## 2008-05-01 LAB — CBC WITH DIFFERENTIAL/PLATELET
BASO%: 0.2 % (ref 0.0–2.0)
LYMPH%: 19.1 % (ref 14.0–48.0)
MCHC: 34.5 g/dL (ref 32.0–36.0)
MONO#: 0.3 10*3/uL (ref 0.1–0.9)
Platelets: 211 10*3/uL (ref 145–400)
RBC: 3.13 10*6/uL — ABNORMAL LOW (ref 3.70–5.32)
RDW: 14.2 % (ref 11.3–14.5)
WBC: 4.4 10*3/uL (ref 3.9–10.0)
lymph#: 0.8 10*3/uL — ABNORMAL LOW (ref 0.9–3.3)

## 2008-05-01 LAB — COMPREHENSIVE METABOLIC PANEL
ALT: 15 U/L (ref 0–35)
CO2: 28 mEq/L (ref 19–32)
Sodium: 142 mEq/L (ref 135–145)
Total Bilirubin: 0.8 mg/dL (ref 0.3–1.2)
Total Protein: 6.4 g/dL (ref 6.0–8.3)

## 2008-05-01 LAB — URINALYSIS, MICROSCOPIC - CHCC
Blood: NEGATIVE
Glucose: NEGATIVE g/dL
Leukocyte Esterase: NEGATIVE
Nitrite: NEGATIVE
pH: 6.5 (ref 4.6–8.0)

## 2008-05-06 LAB — BASIC METABOLIC PANEL
CO2: 24 mEq/L (ref 19–32)
Calcium: 8.6 mg/dL (ref 8.4–10.5)
Chloride: 107 mEq/L (ref 96–112)
Sodium: 139 mEq/L (ref 135–145)

## 2008-05-29 LAB — CBC WITH DIFFERENTIAL/PLATELET
BASO%: 0.3 % (ref 0.0–2.0)
Eosinophils Absolute: 0 10*3/uL (ref 0.0–0.5)
HCT: 31.1 % — ABNORMAL LOW (ref 34.8–46.6)
MCHC: 34.1 g/dL (ref 32.0–36.0)
MONO#: 0.3 10*3/uL (ref 0.1–0.9)
NEUT#: 3.3 10*3/uL (ref 1.5–6.5)
RBC: 3.18 10*6/uL — ABNORMAL LOW (ref 3.70–5.32)
WBC: 4.6 10*3/uL (ref 3.9–10.0)
lymph#: 0.9 10*3/uL (ref 0.9–3.3)

## 2008-05-29 LAB — COMPREHENSIVE METABOLIC PANEL
ALT: 11 U/L (ref 0–35)
Albumin: 3.9 g/dL (ref 3.5–5.2)
CO2: 26 mEq/L (ref 19–32)
Calcium: 8.3 mg/dL — ABNORMAL LOW (ref 8.4–10.5)
Chloride: 105 mEq/L (ref 96–112)
Potassium: 3.8 mEq/L (ref 3.5–5.3)
Sodium: 140 mEq/L (ref 135–145)
Total Protein: 6.5 g/dL (ref 6.0–8.3)

## 2008-06-24 ENCOUNTER — Ambulatory Visit: Payer: Self-pay | Admitting: Internal Medicine

## 2008-06-26 LAB — CBC WITH DIFFERENTIAL/PLATELET
Eosinophils Absolute: 0.1 10*3/uL (ref 0.0–0.5)
MONO#: 0.3 10*3/uL (ref 0.1–0.9)
NEUT#: 2.8 10*3/uL (ref 1.5–6.5)
RBC: 3.08 10*6/uL — ABNORMAL LOW (ref 3.70–5.32)
RDW: 14.6 % — ABNORMAL HIGH (ref 11.3–14.5)
WBC: 4 10*3/uL (ref 3.9–10.0)
lymph#: 0.8 10*3/uL — ABNORMAL LOW (ref 0.9–3.3)

## 2008-06-26 LAB — COMPREHENSIVE METABOLIC PANEL
Albumin: 3.8 g/dL (ref 3.5–5.2)
CO2: 25 mEq/L (ref 19–32)
Glucose, Bld: 110 mg/dL — ABNORMAL HIGH (ref 70–99)
Potassium: 3.6 mEq/L (ref 3.5–5.3)
Sodium: 143 mEq/L (ref 135–145)
Total Protein: 6.1 g/dL (ref 6.0–8.3)

## 2008-07-24 ENCOUNTER — Ambulatory Visit (HOSPITAL_COMMUNITY): Admission: RE | Admit: 2008-07-24 | Discharge: 2008-07-24 | Payer: Self-pay | Admitting: Internal Medicine

## 2008-07-28 LAB — CBC WITH DIFFERENTIAL/PLATELET
Eosinophils Absolute: 0.1 10*3/uL (ref 0.0–0.5)
HCT: 31.3 % — ABNORMAL LOW (ref 34.8–46.6)
HGB: 10.8 g/dL — ABNORMAL LOW (ref 11.6–15.9)
LYMPH%: 16.4 % (ref 14.0–48.0)
MONO#: 0.4 10*3/uL (ref 0.1–0.9)
NEUT#: 4 10*3/uL (ref 1.5–6.5)
Platelets: 185 10*3/uL (ref 145–400)
RBC: 3.22 10*6/uL — ABNORMAL LOW (ref 3.70–5.32)
WBC: 5.3 10*3/uL (ref 3.9–10.0)

## 2008-07-28 LAB — COMPREHENSIVE METABOLIC PANEL
Albumin: 3.5 g/dL (ref 3.5–5.2)
CO2: 29 mEq/L (ref 19–32)
Glucose, Bld: 109 mg/dL — ABNORMAL HIGH (ref 70–99)
Potassium: 3.7 mEq/L (ref 3.5–5.3)
Sodium: 142 mEq/L (ref 135–145)
Total Bilirubin: 1.1 mg/dL (ref 0.3–1.2)
Total Protein: 6.2 g/dL (ref 6.0–8.3)

## 2008-08-22 ENCOUNTER — Ambulatory Visit: Payer: Self-pay | Admitting: Internal Medicine

## 2008-08-26 LAB — CBC WITH DIFFERENTIAL/PLATELET
BASO%: 0.3 % (ref 0.0–2.0)
EOS%: 1 % (ref 0.0–7.0)
LYMPH%: 16.3 % (ref 14.0–49.7)
MCHC: 33.7 g/dL (ref 31.5–36.0)
MCV: 98.3 fL (ref 79.5–101.0)
MONO%: 7.3 % (ref 0.0–14.0)
Platelets: 246 10*3/uL (ref 145–400)
RBC: 3.11 10*6/uL — ABNORMAL LOW (ref 3.70–5.45)
WBC: 5.5 10*3/uL (ref 3.9–10.3)

## 2008-08-26 LAB — COMPREHENSIVE METABOLIC PANEL
ALT: 10 U/L (ref 0–35)
Alkaline Phosphatase: 63 U/L (ref 39–117)
Sodium: 144 mEq/L (ref 135–145)
Total Bilirubin: 0.6 mg/dL (ref 0.3–1.2)
Total Protein: 6.3 g/dL (ref 6.0–8.3)

## 2008-08-26 LAB — URINALYSIS, MICROSCOPIC - CHCC
Nitrite: POSITIVE
pH: 6 (ref 4.6–8.0)

## 2008-09-16 LAB — URINALYSIS, MICROSCOPIC - CHCC
Bilirubin (Urine): NEGATIVE
Glucose: NEGATIVE g/dL
Ketones: NEGATIVE mg/dL
pH: 6.5 (ref 4.6–8.0)

## 2008-09-23 LAB — COMPREHENSIVE METABOLIC PANEL
ALT: 10 U/L (ref 0–35)
Alkaline Phosphatase: 55 U/L (ref 39–117)
CO2: 26 mEq/L (ref 19–32)
Sodium: 142 mEq/L (ref 135–145)
Total Bilirubin: 0.7 mg/dL (ref 0.3–1.2)
Total Protein: 6.2 g/dL (ref 6.0–8.3)

## 2008-09-23 LAB — CBC WITH DIFFERENTIAL/PLATELET
Basophils Absolute: 0 10*3/uL (ref 0.0–0.1)
EOS%: 2.4 % (ref 0.0–7.0)
HGB: 10.3 g/dL — ABNORMAL LOW (ref 11.6–15.9)
MCH: 33.5 pg (ref 25.1–34.0)
MCV: 97.9 fL (ref 79.5–101.0)
MONO%: 7.6 % (ref 0.0–14.0)
RDW: 14.1 % (ref 11.2–14.5)

## 2008-10-16 ENCOUNTER — Ambulatory Visit (HOSPITAL_COMMUNITY): Admission: RE | Admit: 2008-10-16 | Discharge: 2008-10-16 | Payer: Self-pay | Admitting: Internal Medicine

## 2008-10-20 ENCOUNTER — Ambulatory Visit: Payer: Self-pay | Admitting: Internal Medicine

## 2008-10-22 LAB — CBC WITH DIFFERENTIAL/PLATELET
BASO%: 0.3 % (ref 0.0–2.0)
EOS%: 1.5 % (ref 0.0–7.0)
MCH: 33.3 pg (ref 25.1–34.0)
MCHC: 34.4 g/dL (ref 31.5–36.0)
MCV: 96.9 fL (ref 79.5–101.0)
MONO%: 7.1 % (ref 0.0–14.0)
RBC: 3.23 10*6/uL — ABNORMAL LOW (ref 3.70–5.45)
RDW: 13.8 % (ref 11.2–14.5)

## 2008-10-22 LAB — COMPREHENSIVE METABOLIC PANEL
ALT: 12 U/L (ref 0–35)
AST: 17 U/L (ref 0–37)
Albumin: 3.8 g/dL (ref 3.5–5.2)
Alkaline Phosphatase: 63 U/L (ref 39–117)
BUN: 20 mg/dL (ref 6–23)
Potassium: 3.5 mEq/L (ref 3.5–5.3)
Sodium: 144 mEq/L (ref 135–145)
Total Protein: 6.2 g/dL (ref 6.0–8.3)

## 2008-11-24 LAB — CBC WITH DIFFERENTIAL/PLATELET
EOS%: 2.8 % (ref 0.0–7.0)
Eosinophils Absolute: 0.1 10*3/uL (ref 0.0–0.5)
MCV: 97 fL (ref 79.5–101.0)
MONO%: 8.7 % (ref 0.0–14.0)
NEUT#: 2.3 10*3/uL (ref 1.5–6.5)
RBC: 3.08 10*6/uL — ABNORMAL LOW (ref 3.70–5.45)
RDW: 14.3 % (ref 11.2–14.5)
lymph#: 0.9 10*3/uL (ref 0.9–3.3)

## 2008-11-24 LAB — COMPREHENSIVE METABOLIC PANEL
AST: 16 U/L (ref 0–37)
Albumin: 3.8 g/dL (ref 3.5–5.2)
Alkaline Phosphatase: 70 U/L (ref 39–117)
Calcium: 8.3 mg/dL — ABNORMAL LOW (ref 8.4–10.5)
Chloride: 108 mEq/L (ref 96–112)
Glucose, Bld: 82 mg/dL (ref 70–99)
Potassium: 3.5 mEq/L (ref 3.5–5.3)
Sodium: 142 mEq/L (ref 135–145)
Total Protein: 6.2 g/dL (ref 6.0–8.3)

## 2008-12-05 ENCOUNTER — Ambulatory Visit: Payer: Self-pay | Admitting: Internal Medicine

## 2008-12-23 LAB — COMPREHENSIVE METABOLIC PANEL
AST: 18 U/L (ref 0–37)
Albumin: 3.8 g/dL (ref 3.5–5.2)
BUN: 23 mg/dL (ref 6–23)
Calcium: 8.4 mg/dL (ref 8.4–10.5)
Chloride: 107 mEq/L (ref 96–112)
Glucose, Bld: 90 mg/dL (ref 70–99)
Potassium: 3.7 mEq/L (ref 3.5–5.3)

## 2008-12-23 LAB — CBC WITH DIFFERENTIAL/PLATELET
Basophils Absolute: 0 10*3/uL (ref 0.0–0.1)
Eosinophils Absolute: 0.1 10*3/uL (ref 0.0–0.5)
HGB: 11 g/dL — ABNORMAL LOW (ref 11.6–15.9)
NEUT#: 3.7 10*3/uL (ref 1.5–6.5)
RDW: 14.1 % (ref 11.2–14.5)
lymph#: 1 10*3/uL (ref 0.9–3.3)

## 2009-01-16 ENCOUNTER — Ambulatory Visit (HOSPITAL_COMMUNITY): Admission: RE | Admit: 2009-01-16 | Discharge: 2009-01-16 | Payer: Self-pay | Admitting: Internal Medicine

## 2009-01-16 ENCOUNTER — Ambulatory Visit: Payer: Self-pay | Admitting: Internal Medicine

## 2009-01-20 LAB — CBC WITH DIFFERENTIAL/PLATELET
Basophils Absolute: 0 10*3/uL (ref 0.0–0.1)
Eosinophils Absolute: 0.1 10*3/uL (ref 0.0–0.5)
HCT: 30.8 % — ABNORMAL LOW (ref 34.8–46.6)
HGB: 10.6 g/dL — ABNORMAL LOW (ref 11.6–15.9)
MCV: 96.9 fL (ref 79.5–101.0)
MONO%: 7.6 % (ref 0.0–14.0)
NEUT#: 3.4 10*3/uL (ref 1.5–6.5)
RDW: 14.2 % (ref 11.2–14.5)
lymph#: 0.9 10*3/uL (ref 0.9–3.3)

## 2009-01-20 LAB — COMPREHENSIVE METABOLIC PANEL
Albumin: 3.9 g/dL (ref 3.5–5.2)
BUN: 20 mg/dL (ref 6–23)
Calcium: 8.5 mg/dL (ref 8.4–10.5)
Chloride: 106 mEq/L (ref 96–112)
Glucose, Bld: 88 mg/dL (ref 70–99)
Potassium: 4.1 mEq/L (ref 3.5–5.3)

## 2009-02-17 ENCOUNTER — Ambulatory Visit: Payer: Self-pay | Admitting: Internal Medicine

## 2009-02-19 LAB — CBC WITH DIFFERENTIAL/PLATELET
Basophils Absolute: 0 10*3/uL (ref 0.0–0.1)
Eosinophils Absolute: 0.1 10*3/uL (ref 0.0–0.5)
HCT: 29.7 % — ABNORMAL LOW (ref 34.8–46.6)
HGB: 10.3 g/dL — ABNORMAL LOW (ref 11.6–15.9)
LYMPH%: 18.4 % (ref 14.0–49.7)
MCH: 33.7 pg (ref 25.1–34.0)
MCV: 97.1 fL (ref 79.5–101.0)
MONO%: 6 % (ref 0.0–14.0)
NEUT#: 3.3 10*3/uL (ref 1.5–6.5)
NEUT%: 74.1 % (ref 38.4–76.8)
Platelets: 195 10*3/uL (ref 145–400)

## 2009-02-19 LAB — COMPREHENSIVE METABOLIC PANEL
Albumin: 3.8 g/dL (ref 3.5–5.2)
Alkaline Phosphatase: 57 U/L (ref 39–117)
BUN: 12 mg/dL (ref 6–23)
Creatinine, Ser: 0.95 mg/dL (ref 0.40–1.20)
Glucose, Bld: 95 mg/dL (ref 70–99)
Potassium: 3.9 mEq/L (ref 3.5–5.3)
Total Bilirubin: 0.6 mg/dL (ref 0.3–1.2)

## 2009-03-24 ENCOUNTER — Ambulatory Visit: Payer: Self-pay | Admitting: Internal Medicine

## 2009-03-26 LAB — CBC WITH DIFFERENTIAL/PLATELET
Eosinophils Absolute: 0.1 10*3/uL (ref 0.0–0.5)
LYMPH%: 18.1 % (ref 14.0–49.7)
MCHC: 34.3 g/dL (ref 31.5–36.0)
MCV: 98.7 fL (ref 79.5–101.0)
MONO%: 7.7 % (ref 0.0–14.0)
Platelets: 203 10*3/uL (ref 145–400)
RBC: 3.22 10*6/uL — ABNORMAL LOW (ref 3.70–5.45)

## 2009-03-26 LAB — COMPREHENSIVE METABOLIC PANEL
Alkaline Phosphatase: 58 U/L (ref 39–117)
Glucose, Bld: 98 mg/dL (ref 70–99)
Sodium: 142 mEq/L (ref 135–145)
Total Bilirubin: 0.8 mg/dL (ref 0.3–1.2)
Total Protein: 6.4 g/dL (ref 6.0–8.3)

## 2009-04-17 ENCOUNTER — Ambulatory Visit (HOSPITAL_COMMUNITY): Admission: RE | Admit: 2009-04-17 | Discharge: 2009-04-17 | Payer: Self-pay | Admitting: Internal Medicine

## 2009-04-21 LAB — COMPREHENSIVE METABOLIC PANEL
AST: 18 U/L (ref 0–37)
Albumin: 3.9 g/dL (ref 3.5–5.2)
BUN: 25 mg/dL — ABNORMAL HIGH (ref 6–23)
CO2: 22 mEq/L (ref 19–32)
Calcium: 8.3 mg/dL — ABNORMAL LOW (ref 8.4–10.5)
Chloride: 108 mEq/L (ref 96–112)
Glucose, Bld: 113 mg/dL — ABNORMAL HIGH (ref 70–99)
Potassium: 3.7 mEq/L (ref 3.5–5.3)

## 2009-04-21 LAB — CBC WITH DIFFERENTIAL/PLATELET
Basophils Absolute: 0 10*3/uL (ref 0.0–0.1)
EOS%: 1.6 % (ref 0.0–7.0)
Eosinophils Absolute: 0.1 10*3/uL (ref 0.0–0.5)
LYMPH%: 18.5 % (ref 14.0–49.7)
MCH: 32.6 pg (ref 25.1–34.0)
MCV: 98.6 fL (ref 79.5–101.0)
MONO%: 8.6 % (ref 0.0–14.0)
NEUT#: 4.5 10*3/uL (ref 1.5–6.5)
Platelets: 186 10*3/uL (ref 145–400)
RBC: 3.5 10*6/uL — ABNORMAL LOW (ref 3.70–5.45)

## 2009-05-12 ENCOUNTER — Ambulatory Visit: Payer: Self-pay | Admitting: Internal Medicine

## 2009-05-18 LAB — COMPREHENSIVE METABOLIC PANEL
ALT: 11 U/L (ref 0–35)
AST: 17 U/L (ref 0–37)
Alkaline Phosphatase: 63 U/L (ref 39–117)
Sodium: 143 mEq/L (ref 135–145)
Total Bilirubin: 0.6 mg/dL (ref 0.3–1.2)
Total Protein: 6.4 g/dL (ref 6.0–8.3)

## 2009-05-18 LAB — CBC WITH DIFFERENTIAL/PLATELET
BASO%: 0.4 % (ref 0.0–2.0)
LYMPH%: 14.3 % (ref 14.0–49.7)
MCHC: 33.6 g/dL (ref 31.5–36.0)
MCV: 99.7 fL (ref 79.5–101.0)
MONO%: 6.1 % (ref 0.0–14.0)
Platelets: 221 10*3/uL (ref 145–400)
RBC: 3.12 10*6/uL — ABNORMAL LOW (ref 3.70–5.45)
WBC: 5.3 10*3/uL (ref 3.9–10.3)

## 2009-06-10 ENCOUNTER — Ambulatory Visit: Payer: Self-pay | Admitting: Internal Medicine

## 2009-06-17 LAB — COMPREHENSIVE METABOLIC PANEL
Albumin: 3.8 g/dL (ref 3.5–5.2)
Alkaline Phosphatase: 66 U/L (ref 39–117)
BUN: 25 mg/dL — ABNORMAL HIGH (ref 6–23)
Creatinine, Ser: 0.92 mg/dL (ref 0.40–1.20)
Glucose, Bld: 103 mg/dL — ABNORMAL HIGH (ref 70–99)
Total Bilirubin: 0.6 mg/dL (ref 0.3–1.2)

## 2009-06-17 LAB — CBC WITH DIFFERENTIAL/PLATELET
Basophils Absolute: 0 10*3/uL (ref 0.0–0.1)
Eosinophils Absolute: 0 10*3/uL (ref 0.0–0.5)
HGB: 11.4 g/dL — ABNORMAL LOW (ref 11.6–15.9)
LYMPH%: 13.4 % — ABNORMAL LOW (ref 14.0–49.7)
MCH: 33.8 pg (ref 25.1–34.0)
MCV: 99.7 fL (ref 79.5–101.0)
MONO%: 6.7 % (ref 0.0–14.0)
NEUT#: 4.8 10*3/uL (ref 1.5–6.5)
NEUT%: 78.9 % — ABNORMAL HIGH (ref 38.4–76.8)
Platelets: 222 10*3/uL (ref 145–400)

## 2009-07-14 ENCOUNTER — Ambulatory Visit (HOSPITAL_COMMUNITY): Admission: RE | Admit: 2009-07-14 | Discharge: 2009-07-14 | Payer: Self-pay | Admitting: Internal Medicine

## 2009-07-17 ENCOUNTER — Ambulatory Visit: Payer: Self-pay | Admitting: Internal Medicine

## 2009-07-21 LAB — BASIC METABOLIC PANEL
CO2: 24 mEq/L (ref 19–32)
Chloride: 106 mEq/L (ref 96–112)
Sodium: 140 mEq/L (ref 135–145)

## 2009-08-14 ENCOUNTER — Ambulatory Visit: Payer: Self-pay | Admitting: Internal Medicine

## 2009-08-18 LAB — CBC WITH DIFFERENTIAL/PLATELET
BASO%: 0.1 % (ref 0.0–2.0)
Basophils Absolute: 0 10*3/uL (ref 0.0–0.1)
EOS%: 1.9 % (ref 0.0–7.0)
HCT: 31 % — ABNORMAL LOW (ref 34.8–46.6)
HGB: 10.7 g/dL — ABNORMAL LOW (ref 11.6–15.9)
LYMPH%: 17.1 % (ref 14.0–49.7)
MCH: 33.9 pg (ref 25.1–34.0)
MCHC: 34.5 g/dL (ref 31.5–36.0)
MCV: 98.1 fL (ref 79.5–101.0)
MONO%: 8.4 % (ref 0.0–14.0)
NEUT%: 72.5 % (ref 38.4–76.8)
Platelets: 232 10*3/uL (ref 145–400)

## 2009-08-18 LAB — COMPREHENSIVE METABOLIC PANEL
ALT: 9 U/L (ref 0–35)
AST: 19 U/L (ref 0–37)
BUN: 24 mg/dL — ABNORMAL HIGH (ref 6–23)
Calcium: 7.9 mg/dL — ABNORMAL LOW (ref 8.4–10.5)
Chloride: 106 mEq/L (ref 96–112)
Creatinine, Ser: 0.93 mg/dL (ref 0.40–1.20)
Total Bilirubin: 0.8 mg/dL (ref 0.3–1.2)

## 2009-09-11 ENCOUNTER — Ambulatory Visit: Payer: Self-pay | Admitting: Internal Medicine

## 2009-09-15 LAB — CBC WITH DIFFERENTIAL/PLATELET
Basophils Absolute: 0 10*3/uL (ref 0.0–0.1)
EOS%: 1 % (ref 0.0–7.0)
HCT: 31.3 % — ABNORMAL LOW (ref 34.8–46.6)
HGB: 10.7 g/dL — ABNORMAL LOW (ref 11.6–15.9)
LYMPH%: 16.6 % (ref 14.0–49.7)
MCH: 33.9 pg (ref 25.1–34.0)
MONO#: 0.3 10*3/uL (ref 0.1–0.9)
NEUT%: 76.3 % (ref 38.4–76.8)
Platelets: 217 10*3/uL (ref 145–400)
lymph#: 1 10*3/uL (ref 0.9–3.3)

## 2009-09-15 LAB — COMPREHENSIVE METABOLIC PANEL
BUN: 19 mg/dL (ref 6–23)
CO2: 23 mEq/L (ref 19–32)
Calcium: 8.4 mg/dL (ref 8.4–10.5)
Chloride: 106 mEq/L (ref 96–112)
Creatinine, Ser: 0.96 mg/dL (ref 0.40–1.20)
Total Bilirubin: 0.6 mg/dL (ref 0.3–1.2)

## 2009-10-08 ENCOUNTER — Ambulatory Visit (HOSPITAL_COMMUNITY): Admission: RE | Admit: 2009-10-08 | Discharge: 2009-10-08 | Payer: Self-pay | Admitting: Internal Medicine

## 2009-10-12 ENCOUNTER — Ambulatory Visit: Payer: Self-pay | Admitting: Internal Medicine

## 2009-10-13 LAB — CBC WITH DIFFERENTIAL/PLATELET
BASO%: 0.3 % (ref 0.0–2.0)
Basophils Absolute: 0 10*3/uL (ref 0.0–0.1)
EOS%: 1.6 % (ref 0.0–7.0)
HCT: 31.7 % — ABNORMAL LOW (ref 34.8–46.6)
MCH: 32.3 pg (ref 25.1–34.0)
MCHC: 32.8 g/dL (ref 31.5–36.0)
MCV: 98.4 fL (ref 79.5–101.0)
MONO%: 8.5 % (ref 0.0–14.0)
NEUT%: 70 % (ref 38.4–76.8)
RDW: 13.8 % (ref 11.2–14.5)
lymph#: 1.2 10*3/uL (ref 0.9–3.3)

## 2009-10-13 LAB — COMPREHENSIVE METABOLIC PANEL
ALT: 15 U/L (ref 0–35)
AST: 21 U/L (ref 0–37)
Alkaline Phosphatase: 61 U/L (ref 39–117)
BUN: 22 mg/dL (ref 6–23)
Calcium: 8.2 mg/dL — ABNORMAL LOW (ref 8.4–10.5)
Chloride: 105 mEq/L (ref 96–112)
Creatinine, Ser: 0.9 mg/dL (ref 0.40–1.20)

## 2009-10-27 LAB — COMPREHENSIVE METABOLIC PANEL
ALT: 11 U/L (ref 0–35)
AST: 17 U/L (ref 0–37)
Albumin: 3.8 g/dL (ref 3.5–5.2)
Alkaline Phosphatase: 69 U/L (ref 39–117)
Calcium: 8 mg/dL — ABNORMAL LOW (ref 8.4–10.5)
Chloride: 105 mEq/L (ref 96–112)
Potassium: 4 mEq/L (ref 3.5–5.3)

## 2009-10-27 LAB — CBC WITH DIFFERENTIAL/PLATELET
BASO%: 0.1 % (ref 0.0–2.0)
EOS%: 1.3 % (ref 0.0–7.0)
MCH: 33.9 pg (ref 25.1–34.0)
MCHC: 34.8 g/dL (ref 31.5–36.0)
MONO%: 8.2 % (ref 0.0–14.0)
RBC: 2.9 10*6/uL — ABNORMAL LOW (ref 3.70–5.45)
RDW: 13.9 % (ref 11.2–14.5)
lymph#: 1.2 10*3/uL (ref 0.9–3.3)

## 2009-11-17 ENCOUNTER — Ambulatory Visit: Payer: Self-pay | Admitting: Internal Medicine

## 2009-11-24 LAB — CBC WITH DIFFERENTIAL/PLATELET
Basophils Absolute: 0 10*3/uL (ref 0.0–0.1)
Eosinophils Absolute: 0.1 10*3/uL (ref 0.0–0.5)
HGB: 10.1 g/dL — ABNORMAL LOW (ref 11.6–15.9)
NEUT#: 5.7 10*3/uL (ref 1.5–6.5)
RDW: 13.9 % (ref 11.2–14.5)
lymph#: 0.9 10*3/uL (ref 0.9–3.3)

## 2009-11-24 LAB — COMPREHENSIVE METABOLIC PANEL
AST: 16 U/L (ref 0–37)
Albumin: 3.5 g/dL (ref 3.5–5.2)
BUN: 21 mg/dL (ref 6–23)
Calcium: 8 mg/dL — ABNORMAL LOW (ref 8.4–10.5)
Chloride: 106 mEq/L (ref 96–112)
Glucose, Bld: 108 mg/dL — ABNORMAL HIGH (ref 70–99)
Potassium: 3.5 mEq/L (ref 3.5–5.3)

## 2009-12-15 LAB — CBC WITH DIFFERENTIAL/PLATELET
Basophils Absolute: 0 10*3/uL (ref 0.0–0.1)
EOS%: 1.2 % (ref 0.0–7.0)
HGB: 10.3 g/dL — ABNORMAL LOW (ref 11.6–15.9)
MCH: 33.7 pg (ref 25.1–34.0)
NEUT#: 3.8 10*3/uL (ref 1.5–6.5)
RBC: 3.04 10*6/uL — ABNORMAL LOW (ref 3.70–5.45)
RDW: 13.8 % (ref 11.2–14.5)
lymph#: 1 10*3/uL (ref 0.9–3.3)

## 2009-12-15 LAB — COMPREHENSIVE METABOLIC PANEL
ALT: 12 U/L (ref 0–35)
AST: 18 U/L (ref 0–37)
Albumin: 3.6 g/dL (ref 3.5–5.2)
BUN: 16 mg/dL (ref 6–23)
Calcium: 8.2 mg/dL — ABNORMAL LOW (ref 8.4–10.5)
Chloride: 106 mEq/L (ref 96–112)
Potassium: 3.7 mEq/L (ref 3.5–5.3)
Sodium: 143 mEq/L (ref 135–145)
Total Protein: 6.3 g/dL (ref 6.0–8.3)

## 2010-01-06 ENCOUNTER — Ambulatory Visit (HOSPITAL_COMMUNITY): Admission: RE | Admit: 2010-01-06 | Discharge: 2010-01-06 | Payer: Self-pay | Admitting: Internal Medicine

## 2010-01-08 ENCOUNTER — Ambulatory Visit: Payer: Self-pay | Admitting: Internal Medicine

## 2010-01-12 LAB — CBC WITH DIFFERENTIAL/PLATELET
BASO%: 0.2 % (ref 0.0–2.0)
EOS%: 1.6 % (ref 0.0–7.0)
HGB: 10.5 g/dL — ABNORMAL LOW (ref 11.6–15.9)
MCH: 31.9 pg (ref 25.1–34.0)
MCHC: 32.9 g/dL (ref 31.5–36.0)
RDW: 13.9 % (ref 11.2–14.5)
lymph#: 1.2 10*3/uL (ref 0.9–3.3)

## 2010-01-12 LAB — COMPREHENSIVE METABOLIC PANEL
ALT: 8 U/L (ref 0–35)
AST: 19 U/L (ref 0–37)
Albumin: 3.8 g/dL (ref 3.5–5.2)
BUN: 23 mg/dL (ref 6–23)
CO2: 20 mEq/L (ref 19–32)
Calcium: 8.4 mg/dL (ref 8.4–10.5)
Chloride: 105 mEq/L (ref 96–112)
Potassium: 4.3 mEq/L (ref 3.5–5.3)

## 2010-02-08 ENCOUNTER — Ambulatory Visit: Payer: Self-pay | Admitting: Internal Medicine

## 2010-02-10 LAB — CBC WITH DIFFERENTIAL/PLATELET
Basophils Absolute: 0 10*3/uL (ref 0.0–0.1)
Eosinophils Absolute: 0.1 10*3/uL (ref 0.0–0.5)
HCT: 30.6 % — ABNORMAL LOW (ref 34.8–46.6)
HGB: 10.5 g/dL — ABNORMAL LOW (ref 11.6–15.9)
LYMPH%: 16.2 % (ref 14.0–49.7)
MCH: 33.5 pg (ref 25.1–34.0)
MCV: 97.4 fL (ref 79.5–101.0)
MONO%: 7.1 % (ref 0.0–14.0)
NEUT#: 3.7 10*3/uL (ref 1.5–6.5)
NEUT%: 74.7 % (ref 38.4–76.8)
Platelets: 219 10*3/uL (ref 145–400)

## 2010-02-10 LAB — COMPREHENSIVE METABOLIC PANEL
Albumin: 3.9 g/dL (ref 3.5–5.2)
Alkaline Phosphatase: 59 U/L (ref 39–117)
BUN: 17 mg/dL (ref 6–23)
Creatinine, Ser: 0.92 mg/dL (ref 0.40–1.20)
Glucose, Bld: 95 mg/dL (ref 70–99)
Potassium: 3.6 mEq/L (ref 3.5–5.3)
Total Bilirubin: 0.6 mg/dL (ref 0.3–1.2)

## 2010-03-10 ENCOUNTER — Ambulatory Visit: Payer: Self-pay | Admitting: Internal Medicine

## 2010-03-10 LAB — CBC WITH DIFFERENTIAL/PLATELET
Basophils Absolute: 0 10*3/uL (ref 0.0–0.1)
EOS%: 1.5 % (ref 0.0–7.0)
Eosinophils Absolute: 0.1 10*3/uL (ref 0.0–0.5)
HGB: 10.4 g/dL — ABNORMAL LOW (ref 11.6–15.9)
LYMPH%: 14.1 % (ref 14.0–49.7)
MCH: 32.4 pg (ref 25.1–34.0)
MCV: 98 fL (ref 79.5–101.0)
MONO%: 6.9 % (ref 0.0–14.0)
NEUT#: 4.8 10*3/uL (ref 1.5–6.5)
Platelets: 260 10*3/uL (ref 145–400)
RBC: 3.2 10*6/uL — ABNORMAL LOW (ref 3.70–5.45)
RDW: 14.4 % (ref 11.2–14.5)

## 2010-03-10 LAB — COMPREHENSIVE METABOLIC PANEL
AST: 20 U/L (ref 0–37)
Alkaline Phosphatase: 64 U/L (ref 39–117)
BUN: 19 mg/dL (ref 6–23)
Glucose, Bld: 90 mg/dL (ref 70–99)
Total Bilirubin: 0.7 mg/dL (ref 0.3–1.2)

## 2010-04-05 ENCOUNTER — Ambulatory Visit (HOSPITAL_COMMUNITY): Admission: RE | Admit: 2010-04-05 | Discharge: 2010-04-05 | Payer: Self-pay | Admitting: Internal Medicine

## 2010-04-12 ENCOUNTER — Ambulatory Visit: Payer: Self-pay | Admitting: Internal Medicine

## 2010-04-12 LAB — COMPREHENSIVE METABOLIC PANEL
AST: 20 U/L (ref 0–37)
Albumin: 3.7 g/dL (ref 3.5–5.2)
Alkaline Phosphatase: 67 U/L (ref 39–117)
Glucose, Bld: 103 mg/dL — ABNORMAL HIGH (ref 70–99)
Potassium: 3.9 mEq/L (ref 3.5–5.3)
Sodium: 139 mEq/L (ref 135–145)
Total Bilirubin: 0.6 mg/dL (ref 0.3–1.2)
Total Protein: 6.5 g/dL (ref 6.0–8.3)

## 2010-05-11 LAB — CBC WITH DIFFERENTIAL/PLATELET
BASO%: 0.2 % (ref 0.0–2.0)
Basophils Absolute: 0 10e3/uL (ref 0.0–0.1)
EOS%: 1.8 % (ref 0.0–7.0)
Eosinophils Absolute: 0.1 10e3/uL (ref 0.0–0.5)
HCT: 28.4 % — ABNORMAL LOW (ref 34.8–46.6)
HGB: 9.8 g/dL — ABNORMAL LOW (ref 11.6–15.9)
LYMPH%: 20 % (ref 14.0–49.7)
MCH: 33.6 pg (ref 25.1–34.0)
MCHC: 34.5 g/dL (ref 31.5–36.0)
MCV: 97.4 fL (ref 79.5–101.0)
MONO#: 0.4 10e3/uL (ref 0.1–0.9)
MONO%: 7.8 % (ref 0.0–14.0)
NEUT#: 3.5 10e3/uL (ref 1.5–6.5)
NEUT%: 70.2 % (ref 38.4–76.8)
Platelets: 186 10e3/uL (ref 145–400)
RBC: 2.92 10e6/uL — ABNORMAL LOW (ref 3.70–5.45)
RDW: 14.3 % (ref 11.2–14.5)
WBC: 5 10e3/uL (ref 3.9–10.3)
lymph#: 1 10e3/uL (ref 0.9–3.3)

## 2010-05-11 LAB — COMPREHENSIVE METABOLIC PANEL
BUN: 15 mg/dL (ref 6–23)
CO2: 28 mEq/L (ref 19–32)
Calcium: 8.4 mg/dL (ref 8.4–10.5)
Chloride: 107 mEq/L (ref 96–112)
Creatinine, Ser: 0.96 mg/dL (ref 0.40–1.20)

## 2010-05-14 ENCOUNTER — Ambulatory Visit: Payer: Self-pay | Admitting: Internal Medicine

## 2010-06-08 LAB — CBC WITH DIFFERENTIAL/PLATELET
Basophils Absolute: 0 10*3/uL (ref 0.0–0.1)
EOS%: 1.9 % (ref 0.0–7.0)
HCT: 31.4 % — ABNORMAL LOW (ref 34.8–46.6)
HGB: 10.8 g/dL — ABNORMAL LOW (ref 11.6–15.9)
LYMPH%: 17.8 % (ref 14.0–49.7)
MCH: 33.6 pg (ref 25.1–34.0)
MCV: 97.5 fL (ref 79.5–101.0)
MONO%: 7.7 % (ref 0.0–14.0)
NEUT%: 72.2 % (ref 38.4–76.8)
Platelets: 213 10*3/uL (ref 145–400)

## 2010-06-08 LAB — COMPREHENSIVE METABOLIC PANEL
AST: 19 U/L (ref 0–37)
BUN: 19 mg/dL (ref 6–23)
Calcium: 8.5 mg/dL (ref 8.4–10.5)
Chloride: 105 mEq/L (ref 96–112)
Creatinine, Ser: 0.93 mg/dL (ref 0.40–1.20)

## 2010-07-01 ENCOUNTER — Ambulatory Visit (HOSPITAL_COMMUNITY)
Admission: RE | Admit: 2010-07-01 | Discharge: 2010-07-01 | Payer: Self-pay | Source: Home / Self Care | Attending: Internal Medicine | Admitting: Internal Medicine

## 2010-07-06 ENCOUNTER — Ambulatory Visit: Payer: Self-pay | Admitting: Internal Medicine

## 2010-07-08 LAB — CBC WITH DIFFERENTIAL/PLATELET
BASO%: 0.3 % (ref 0.0–2.0)
Basophils Absolute: 0 10*3/uL (ref 0.0–0.1)
EOS%: 1.7 % (ref 0.0–7.0)
Eosinophils Absolute: 0.1 10*3/uL (ref 0.0–0.5)
HCT: 31.9 % — ABNORMAL LOW (ref 34.8–46.6)
HGB: 10.9 g/dL — ABNORMAL LOW (ref 11.6–15.9)
LYMPH%: 15.5 % (ref 14.0–49.7)
MCH: 33.1 pg (ref 25.1–34.0)
MCHC: 34.3 g/dL (ref 31.5–36.0)
MCV: 96.4 fL (ref 79.5–101.0)
MONO#: 0.3 10*3/uL (ref 0.1–0.9)
MONO%: 5.8 % (ref 0.0–14.0)
NEUT#: 4.3 10*3/uL (ref 1.5–6.5)
NEUT%: 76.7 % (ref 38.4–76.8)
Platelets: 232 10*3/uL (ref 145–400)
RBC: 3.31 10*6/uL — ABNORMAL LOW (ref 3.70–5.45)
RDW: 14 % (ref 11.2–14.5)
WBC: 5.7 10*3/uL (ref 3.9–10.3)
lymph#: 0.9 10*3/uL (ref 0.9–3.3)

## 2010-07-08 LAB — COMPREHENSIVE METABOLIC PANEL
ALT: 11 U/L (ref 0–35)
AST: 17 U/L (ref 0–37)
Albumin: 3.9 g/dL (ref 3.5–5.2)
Alkaline Phosphatase: 73 U/L (ref 39–117)
BUN: 19 mg/dL (ref 6–23)
CO2: 26 mEq/L (ref 19–32)
Calcium: 8.5 mg/dL (ref 8.4–10.5)
Chloride: 102 mEq/L (ref 96–112)
Creatinine, Ser: 1.04 mg/dL (ref 0.40–1.20)
Glucose, Bld: 108 mg/dL — ABNORMAL HIGH (ref 70–99)
Potassium: 3.5 mEq/L (ref 3.5–5.3)
Sodium: 140 mEq/L (ref 135–145)
Total Bilirubin: 0.6 mg/dL (ref 0.3–1.2)
Total Protein: 6.7 g/dL (ref 6.0–8.3)

## 2010-07-11 ENCOUNTER — Encounter: Payer: Self-pay | Admitting: Internal Medicine

## 2010-07-11 ENCOUNTER — Encounter: Payer: Self-pay | Admitting: Interventional Radiology

## 2010-08-19 ENCOUNTER — Other Ambulatory Visit: Payer: Self-pay | Admitting: Internal Medicine

## 2010-08-19 ENCOUNTER — Encounter (HOSPITAL_BASED_OUTPATIENT_CLINIC_OR_DEPARTMENT_OTHER): Payer: Medicare Other | Admitting: Internal Medicine

## 2010-08-19 DIAGNOSIS — J984 Other disorders of lung: Secondary | ICD-10-CM

## 2010-08-19 DIAGNOSIS — C7952 Secondary malignant neoplasm of bone marrow: Secondary | ICD-10-CM

## 2010-08-19 DIAGNOSIS — Z452 Encounter for adjustment and management of vascular access device: Secondary | ICD-10-CM

## 2010-08-19 DIAGNOSIS — C3410 Malignant neoplasm of upper lobe, unspecified bronchus or lung: Secondary | ICD-10-CM

## 2010-08-19 LAB — COMPREHENSIVE METABOLIC PANEL
ALT: 12 U/L (ref 0–35)
Albumin: 3.8 g/dL (ref 3.5–5.2)
BUN: 18 mg/dL (ref 6–23)
CO2: 28 mEq/L (ref 19–32)
Calcium: 8.4 mg/dL (ref 8.4–10.5)
Chloride: 103 mEq/L (ref 96–112)
Creatinine, Ser: 0.95 mg/dL (ref 0.40–1.20)

## 2010-08-19 LAB — CBC WITH DIFFERENTIAL/PLATELET
Basophils Absolute: 0 10*3/uL (ref 0.0–0.1)
HCT: 31.3 % — ABNORMAL LOW (ref 34.8–46.6)
HGB: 10.7 g/dL — ABNORMAL LOW (ref 11.6–15.9)
MONO#: 0.4 10*3/uL (ref 0.1–0.9)
NEUT#: 4 10*3/uL (ref 1.5–6.5)
NEUT%: 76.6 % (ref 38.4–76.8)
WBC: 5.2 10*3/uL (ref 3.9–10.3)
lymph#: 0.7 10*3/uL — ABNORMAL LOW (ref 0.9–3.3)

## 2010-09-30 ENCOUNTER — Other Ambulatory Visit: Payer: Self-pay | Admitting: Internal Medicine

## 2010-09-30 ENCOUNTER — Encounter (HOSPITAL_BASED_OUTPATIENT_CLINIC_OR_DEPARTMENT_OTHER): Payer: Medicare Other | Admitting: Internal Medicine

## 2010-09-30 DIAGNOSIS — J984 Other disorders of lung: Secondary | ICD-10-CM

## 2010-09-30 DIAGNOSIS — C3410 Malignant neoplasm of upper lobe, unspecified bronchus or lung: Secondary | ICD-10-CM

## 2010-09-30 DIAGNOSIS — C3490 Malignant neoplasm of unspecified part of unspecified bronchus or lung: Secondary | ICD-10-CM

## 2010-09-30 DIAGNOSIS — C7951 Secondary malignant neoplasm of bone: Secondary | ICD-10-CM

## 2010-09-30 LAB — COMPREHENSIVE METABOLIC PANEL
ALT: 12 U/L (ref 0–35)
AST: 18 U/L (ref 0–37)
Albumin: 3.7 g/dL (ref 3.5–5.2)
CO2: 26 mEq/L (ref 19–32)
Calcium: 8.6 mg/dL (ref 8.4–10.5)
Chloride: 105 mEq/L (ref 96–112)
Creatinine, Ser: 0.96 mg/dL (ref 0.40–1.20)
Potassium: 3.7 mEq/L (ref 3.5–5.3)
Sodium: 141 mEq/L (ref 135–145)
Total Protein: 6.9 g/dL (ref 6.0–8.3)

## 2010-09-30 LAB — CBC WITH DIFFERENTIAL/PLATELET
BASO%: 0.5 % (ref 0.0–2.0)
HCT: 31 % — ABNORMAL LOW (ref 34.8–46.6)
HGB: 10.7 g/dL — ABNORMAL LOW (ref 11.6–15.9)
MCHC: 34.6 g/dL (ref 31.5–36.0)
MONO#: 0.4 10*3/uL (ref 0.1–0.9)
NEUT%: 82.3 % — ABNORMAL HIGH (ref 38.4–76.8)
RDW: 14.1 % (ref 11.2–14.5)
WBC: 7.5 10*3/uL (ref 3.9–10.3)
lymph#: 0.8 10*3/uL — ABNORMAL LOW (ref 0.9–3.3)

## 2010-10-04 ENCOUNTER — Encounter (HOSPITAL_BASED_OUTPATIENT_CLINIC_OR_DEPARTMENT_OTHER): Payer: Medicare Other | Admitting: Internal Medicine

## 2010-10-04 DIAGNOSIS — C3410 Malignant neoplasm of upper lobe, unspecified bronchus or lung: Secondary | ICD-10-CM

## 2010-10-04 DIAGNOSIS — C7952 Secondary malignant neoplasm of bone marrow: Secondary | ICD-10-CM

## 2010-11-03 ENCOUNTER — Ambulatory Visit (HOSPITAL_COMMUNITY)
Admission: RE | Admit: 2010-11-03 | Discharge: 2010-11-03 | Disposition: A | Discharge status: Home / Self Care | Payer: Medicare Other | Source: Ambulatory Visit | Attending: Internal Medicine | Admitting: Internal Medicine

## 2010-11-03 DIAGNOSIS — S32009A Unspecified fracture of unspecified lumbar vertebra, initial encounter for closed fracture: Secondary | ICD-10-CM | POA: Insufficient documentation

## 2010-11-03 DIAGNOSIS — C3490 Malignant neoplasm of unspecified part of unspecified bronchus or lung: Secondary | ICD-10-CM | POA: Insufficient documentation

## 2010-11-03 DIAGNOSIS — Z90710 Acquired absence of both cervix and uterus: Secondary | ICD-10-CM | POA: Insufficient documentation

## 2010-11-03 DIAGNOSIS — Z9089 Acquired absence of other organs: Secondary | ICD-10-CM | POA: Insufficient documentation

## 2010-11-03 DIAGNOSIS — X58XXXA Exposure to other specified factors, initial encounter: Secondary | ICD-10-CM | POA: Insufficient documentation

## 2010-11-03 MED ORDER — IOHEXOL 300 MG/ML  SOLN
100.0000 mL | Freq: Once | INTRAMUSCULAR | Status: AC | PRN
  Administered 2010-11-03: 100 mL via INTRAVENOUS

## 2010-11-05 NOTE — Op Note (Signed)
NAME:  Jamie Munoz, Jamie Munoz                           ACCOUNT NO.:  [id number]   MEDICAL RECORD NO.:  [id number]                   PATIENT TYPE:  AMB   LOCATION:  ENDO                                 FACILITY:  [facility]   PHYSICIAN:  Petra Kuba, M.D.                 DATE OF BIRTH:  1926-12-26   DATE OF PROCEDURE:  11/06/2003  DATE OF DISCHARGE:                                 OPERATIVE REPORT   PROCEDURE:  Colonoscopy with biopsy.   INDICATIONS:  Family history of colon cancer.  Due for colonic screening.  Consent was signed after risks, benefits, methods, and options were  thoroughly discussed in the past.   PREMEDICATIONS:  Demerol 70 mg, Versed 10 mg.   DESCRIPTION OF PROCEDURE:  Rectal inspection pertinent for external  hemorrhoids, small.  Digital exam was negative.  Video pediatric adjustable  colonoscope was inserted with some difficulty due to a tortuous sigmoid,  advanced around the colon to the cecum.  This did require rolling her on her  back and some abdominal pressure.  On insertion some left-sided diverticula  were seen, but no other abnormality.  The cecum was identified by the  appendiceal orifice and the ileocecal valve.  The scope was slowly  withdrawn.  Prep was adequate.  There was some liquid stool that required  washing and suctioning.  In the cecum there was a questionable, tiny, 1 mm  polyp seen and was cold biopsied x2.  The scope was slowly withdrawn.  Other  than occasional left-sided diverticula and some tortuosity, no other  abnormalities were seen.  Once back in the rectum, anal rectal pull-through  and retroflexion confirmed some small hemorrhoids.  The scope was reinserted  a short ways up the left side of the colon.  Air was suctioned and the scope  removed.  The patient tolerated the procedure well.  There were no obvious  immediate complications.   ENDOSCOPIC DIAGNOSES:  1. Internal and external small hemorrhoids.  2. Left-sided diverticula and  tortuosity.  3. Cecal questionable tiny polyp.  4. Otherwise within normal limits to the cecum.   PLAN:  Await pathology.  Probably recheck colon screening in five years.  If  widely available at that time, may consider a virtual colonoscopy.  Happy to  see back p.r.n.  Otherwise return care to the above-mentioned doctors for  the customary health care maintenance to include yearly rectals and guaiacs.                                               Petra Kuba, M.D.    MEM/MEDQ  D:  11/06/2003  T:  11/06/2003  Job:  [id number]   cc:   Gloriajean Dell. Andrey Campanile, M.D.  [address]  [place]  [place] [address]  Fax: [phone]   Gretta Cool, M.D.  [address] [place]  [place] [address]  Fax: [phone]

## 2010-11-10 ENCOUNTER — Other Ambulatory Visit: Payer: Self-pay | Admitting: Internal Medicine

## 2010-11-10 ENCOUNTER — Encounter (HOSPITAL_BASED_OUTPATIENT_CLINIC_OR_DEPARTMENT_OTHER): Payer: Medicare Other | Admitting: Internal Medicine

## 2010-11-10 DIAGNOSIS — C3410 Malignant neoplasm of upper lobe, unspecified bronchus or lung: Secondary | ICD-10-CM

## 2010-11-10 DIAGNOSIS — J984 Other disorders of lung: Secondary | ICD-10-CM

## 2010-11-10 DIAGNOSIS — C7951 Secondary malignant neoplasm of bone: Secondary | ICD-10-CM

## 2010-11-10 LAB — CBC WITH DIFFERENTIAL/PLATELET
BASO%: 0.2 % (ref 0.0–2.0)
EOS%: 1.1 % (ref 0.0–7.0)
MCH: 32.8 pg (ref 25.1–34.0)
MCHC: 33.8 g/dL (ref 31.5–36.0)
MONO#: 0.5 10*3/uL (ref 0.1–0.9)
RBC: 3.04 10*6/uL — ABNORMAL LOW (ref 3.70–5.45)
RDW: 14.7 % — ABNORMAL HIGH (ref 11.2–14.5)
WBC: 6.4 10*3/uL (ref 3.9–10.3)
lymph#: 1.1 10*3/uL (ref 0.9–3.3)

## 2010-11-10 LAB — COMPREHENSIVE METABOLIC PANEL
ALT: 11 U/L (ref 0–35)
AST: 16 U/L (ref 0–37)
CO2: 23 mEq/L (ref 19–32)
Calcium: 8.3 mg/dL — ABNORMAL LOW (ref 8.4–10.5)
Chloride: 104 mEq/L (ref 96–112)
Creatinine, Ser: 0.8 mg/dL (ref 0.40–1.20)
Sodium: 140 mEq/L (ref 135–145)
Total Protein: 6.2 g/dL (ref 6.0–8.3)

## 2010-11-22 ENCOUNTER — Other Ambulatory Visit: Payer: Self-pay | Admitting: Radiation Oncology

## 2010-11-22 ENCOUNTER — Ambulatory Visit
Admission: RE | Admit: 2010-11-22 | Discharge: 2010-11-22 | Disposition: A | Discharge status: Home / Self Care | Payer: Medicare Other | Source: Ambulatory Visit | Attending: Radiation Oncology | Admitting: Radiation Oncology

## 2010-11-22 DIAGNOSIS — C3410 Malignant neoplasm of upper lobe, unspecified bronchus or lung: Secondary | ICD-10-CM | POA: Insufficient documentation

## 2010-11-22 DIAGNOSIS — Z510 Encounter for antineoplastic radiation therapy: Secondary | ICD-10-CM | POA: Insufficient documentation

## 2010-11-22 DIAGNOSIS — C3490 Malignant neoplasm of unspecified part of unspecified bronchus or lung: Secondary | ICD-10-CM

## 2010-11-26 ENCOUNTER — Encounter (HOSPITAL_COMMUNITY)
Admission: RE | Admit: 2010-11-26 | Discharge: 2010-11-26 | Disposition: A | Discharge status: Home / Self Care | Payer: Medicare Other | Source: Ambulatory Visit | Attending: Radiation Oncology | Admitting: Radiation Oncology

## 2010-11-26 ENCOUNTER — Encounter (HOSPITAL_COMMUNITY): Payer: Self-pay

## 2010-11-26 DIAGNOSIS — C3490 Malignant neoplasm of unspecified part of unspecified bronchus or lung: Secondary | ICD-10-CM | POA: Insufficient documentation

## 2010-11-26 DIAGNOSIS — C7951 Secondary malignant neoplasm of bone: Secondary | ICD-10-CM | POA: Insufficient documentation

## 2010-11-26 DIAGNOSIS — C7952 Secondary malignant neoplasm of bone marrow: Secondary | ICD-10-CM | POA: Insufficient documentation

## 2010-11-26 DIAGNOSIS — Z9221 Personal history of antineoplastic chemotherapy: Secondary | ICD-10-CM | POA: Insufficient documentation

## 2010-11-26 HISTORY — DX: Malignant (primary) neoplasm, unspecified: C80.1

## 2010-11-26 MED ORDER — FLUDEOXYGLUCOSE F - 18 (FDG) INJECTION
18.3000 | Freq: Once | INTRAVENOUS | Status: AC | PRN
  Administered 2010-11-26: 18.3 via INTRAVENOUS

## 2010-12-23 ENCOUNTER — Other Ambulatory Visit: Payer: Self-pay | Admitting: Internal Medicine

## 2010-12-23 ENCOUNTER — Encounter (HOSPITAL_BASED_OUTPATIENT_CLINIC_OR_DEPARTMENT_OTHER): Payer: Medicare Other | Admitting: Internal Medicine

## 2010-12-23 DIAGNOSIS — J984 Other disorders of lung: Secondary | ICD-10-CM

## 2010-12-23 DIAGNOSIS — C7952 Secondary malignant neoplasm of bone marrow: Secondary | ICD-10-CM

## 2010-12-23 DIAGNOSIS — C3410 Malignant neoplasm of upper lobe, unspecified bronchus or lung: Secondary | ICD-10-CM

## 2010-12-23 DIAGNOSIS — Z452 Encounter for adjustment and management of vascular access device: Secondary | ICD-10-CM

## 2010-12-23 LAB — COMPREHENSIVE METABOLIC PANEL
ALT: 13 U/L (ref 0–35)
AST: 17 U/L (ref 0–37)
Alkaline Phosphatase: 67 U/L (ref 39–117)
CO2: 26 mEq/L (ref 19–32)
Creatinine, Ser: 0.98 mg/dL (ref 0.50–1.10)
Total Bilirubin: 0.5 mg/dL (ref 0.3–1.2)

## 2010-12-23 LAB — CBC WITH DIFFERENTIAL/PLATELET
BASO%: 0.3 % (ref 0.0–2.0)
EOS%: 1.1 % (ref 0.0–7.0)
HCT: 29.5 % — ABNORMAL LOW (ref 34.8–46.6)
LYMPH%: 18.1 % (ref 14.0–49.7)
MCH: 33.5 pg (ref 25.1–34.0)
MCHC: 34.8 g/dL (ref 31.5–36.0)
MCV: 96.3 fL (ref 79.5–101.0)
MONO%: 12 % (ref 0.0–14.0)
NEUT%: 68.5 % (ref 38.4–76.8)
Platelets: 182 10*3/uL (ref 145–400)

## 2010-12-24 ENCOUNTER — Encounter (HOSPITAL_BASED_OUTPATIENT_CLINIC_OR_DEPARTMENT_OTHER): Payer: Medicare Other | Admitting: Internal Medicine

## 2010-12-24 DIAGNOSIS — C3410 Malignant neoplasm of upper lobe, unspecified bronchus or lung: Secondary | ICD-10-CM

## 2010-12-24 DIAGNOSIS — C7952 Secondary malignant neoplasm of bone marrow: Secondary | ICD-10-CM

## 2010-12-24 DIAGNOSIS — C7951 Secondary malignant neoplasm of bone: Secondary | ICD-10-CM

## 2011-01-24 ENCOUNTER — Other Ambulatory Visit: Payer: Self-pay | Admitting: Internal Medicine

## 2011-01-24 ENCOUNTER — Encounter (HOSPITAL_BASED_OUTPATIENT_CLINIC_OR_DEPARTMENT_OTHER): Payer: Medicare Other | Admitting: Internal Medicine

## 2011-01-24 DIAGNOSIS — C7951 Secondary malignant neoplasm of bone: Secondary | ICD-10-CM

## 2011-01-24 DIAGNOSIS — C3410 Malignant neoplasm of upper lobe, unspecified bronchus or lung: Secondary | ICD-10-CM

## 2011-01-24 LAB — CBC WITH DIFFERENTIAL/PLATELET
EOS%: 0.8 % (ref 0.0–7.0)
Eosinophils Absolute: 0 10*3/uL (ref 0.0–0.5)
LYMPH%: 8.8 % — ABNORMAL LOW (ref 14.0–49.7)
MCH: 34.1 pg — ABNORMAL HIGH (ref 25.1–34.0)
MCV: 97.5 fL (ref 79.5–101.0)
MONO%: 6.2 % (ref 0.0–14.0)
NEUT#: 4.9 10*3/uL (ref 1.5–6.5)
Platelets: 218 10*3/uL (ref 145–400)
RBC: 2.99 10*6/uL — ABNORMAL LOW (ref 3.70–5.45)
RDW: 14.4 % (ref 11.2–14.5)

## 2011-01-24 LAB — COMPREHENSIVE METABOLIC PANEL
AST: 17 U/L (ref 0–37)
Alkaline Phosphatase: 66 U/L (ref 39–117)
BUN: 21 mg/dL (ref 6–23)
Glucose, Bld: 107 mg/dL — ABNORMAL HIGH (ref 70–99)
Sodium: 139 mEq/L (ref 135–145)
Total Bilirubin: 0.7 mg/dL (ref 0.3–1.2)
Total Protein: 6.4 g/dL (ref 6.0–8.3)

## 2011-02-10 ENCOUNTER — Ambulatory Visit
Admission: RE | Admit: 2011-02-10 | Discharge: 2011-02-10 | Disposition: A | Discharge status: Home / Self Care | Payer: Medicare Other | Source: Ambulatory Visit | Attending: Radiation Oncology | Admitting: Radiation Oncology

## 2011-02-10 ENCOUNTER — Other Ambulatory Visit: Payer: Self-pay | Admitting: Radiation Oncology

## 2011-02-10 DIAGNOSIS — C3490 Malignant neoplasm of unspecified part of unspecified bronchus or lung: Secondary | ICD-10-CM

## 2011-02-22 ENCOUNTER — Other Ambulatory Visit: Payer: Self-pay | Admitting: Internal Medicine

## 2011-02-22 ENCOUNTER — Encounter (HOSPITAL_BASED_OUTPATIENT_CLINIC_OR_DEPARTMENT_OTHER): Payer: Medicare Other | Admitting: Internal Medicine

## 2011-02-22 DIAGNOSIS — C7951 Secondary malignant neoplasm of bone: Secondary | ICD-10-CM

## 2011-02-22 DIAGNOSIS — C3410 Malignant neoplasm of upper lobe, unspecified bronchus or lung: Secondary | ICD-10-CM

## 2011-02-22 LAB — COMPREHENSIVE METABOLIC PANEL
AST: 22 U/L (ref 0–37)
Alkaline Phosphatase: 63 U/L (ref 39–117)
BUN: 16 mg/dL (ref 6–23)
Calcium: 8.3 mg/dL — ABNORMAL LOW (ref 8.4–10.5)
Creatinine, Ser: 0.88 mg/dL (ref 0.50–1.10)
Total Bilirubin: 0.7 mg/dL (ref 0.3–1.2)

## 2011-02-22 LAB — CBC WITH DIFFERENTIAL/PLATELET
BASO%: 0.2 % (ref 0.0–2.0)
EOS%: 2.6 % (ref 0.0–7.0)
HCT: 30.1 % — ABNORMAL LOW (ref 34.8–46.6)
LYMPH%: 16.5 % (ref 14.0–49.7)
MCH: 31.8 pg (ref 25.1–34.0)
MCHC: 33.2 g/dL (ref 31.5–36.0)
MONO%: 10 % (ref 0.0–14.0)
NEUT%: 70.7 % (ref 38.4–76.8)
Platelets: 179 10*3/uL (ref 145–400)

## 2011-03-14 ENCOUNTER — Ambulatory Visit (HOSPITAL_COMMUNITY)
Admission: RE | Admit: 2011-03-14 | Discharge: 2011-03-14 | Disposition: A | Discharge status: Home / Self Care | Payer: Medicare Other | Source: Ambulatory Visit | Attending: Radiation Oncology | Admitting: Radiation Oncology

## 2011-03-14 DIAGNOSIS — C3410 Malignant neoplasm of upper lobe, unspecified bronchus or lung: Secondary | ICD-10-CM | POA: Insufficient documentation

## 2011-03-14 DIAGNOSIS — C3490 Malignant neoplasm of unspecified part of unspecified bronchus or lung: Secondary | ICD-10-CM

## 2011-03-17 ENCOUNTER — Ambulatory Visit
Admission: RE | Admit: 2011-03-17 | Discharge: 2011-03-17 | Disposition: A | Discharge status: Home / Self Care | Payer: Medicare Other | Source: Ambulatory Visit | Attending: Radiation Oncology | Admitting: Radiation Oncology

## 2011-03-22 ENCOUNTER — Encounter (HOSPITAL_BASED_OUTPATIENT_CLINIC_OR_DEPARTMENT_OTHER): Payer: Medicare Other | Admitting: Internal Medicine

## 2011-03-22 ENCOUNTER — Other Ambulatory Visit: Payer: Self-pay | Admitting: Internal Medicine

## 2011-03-22 DIAGNOSIS — C7951 Secondary malignant neoplasm of bone: Secondary | ICD-10-CM

## 2011-03-22 DIAGNOSIS — C3410 Malignant neoplasm of upper lobe, unspecified bronchus or lung: Secondary | ICD-10-CM

## 2011-03-22 LAB — CBC WITH DIFFERENTIAL/PLATELET
BASO%: 0.2 % (ref 0.0–2.0)
HCT: 31.6 % — ABNORMAL LOW (ref 34.8–46.6)
LYMPH%: 11.7 % — ABNORMAL LOW (ref 14.0–49.7)
MCHC: 32.9 g/dL (ref 31.5–36.0)
MCV: 97.2 fL (ref 79.5–101.0)
MONO#: 0.6 10*3/uL (ref 0.1–0.9)
MONO%: 9.8 % (ref 0.0–14.0)
NEUT%: 76.7 % (ref 38.4–76.8)
Platelets: 154 10*3/uL (ref 145–400)
RBC: 3.25 10*6/uL — ABNORMAL LOW (ref 3.70–5.45)
WBC: 5.6 10*3/uL (ref 3.9–10.3)
nRBC: 0 % (ref 0–0)

## 2011-03-22 LAB — COMPREHENSIVE METABOLIC PANEL
AST: 18 U/L (ref 0–37)
BUN: 14 mg/dL (ref 6–23)
CO2: 25 mEq/L (ref 19–32)
Calcium: 8.5 mg/dL (ref 8.4–10.5)
Chloride: 104 mEq/L (ref 96–112)
Creatinine, Ser: 0.93 mg/dL (ref 0.50–1.10)
Total Bilirubin: 0.6 mg/dL (ref 0.3–1.2)

## 2011-04-04 ENCOUNTER — Encounter: Payer: Self-pay | Admitting: Internal Medicine

## 2011-04-26 NOTE — Progress Notes (Signed)
This encounter was created in error - please disregard.

## 2011-05-05 ENCOUNTER — Encounter: Payer: Self-pay | Admitting: Physician Assistant

## 2011-05-05 ENCOUNTER — Other Ambulatory Visit: Payer: Self-pay | Admitting: Internal Medicine

## 2011-05-05 ENCOUNTER — Other Ambulatory Visit (HOSPITAL_BASED_OUTPATIENT_CLINIC_OR_DEPARTMENT_OTHER): Payer: Medicare Other

## 2011-05-05 ENCOUNTER — Ambulatory Visit (HOSPITAL_BASED_OUTPATIENT_CLINIC_OR_DEPARTMENT_OTHER): Payer: Medicare Other | Admitting: Physician Assistant

## 2011-05-05 ENCOUNTER — Telehealth: Payer: Self-pay | Admitting: Internal Medicine

## 2011-05-05 VITALS — BP 127/58 | HR 75 | Temp 97.9°F | Ht 66.0 in | Wt 140.0 lb

## 2011-05-05 DIAGNOSIS — C3490 Malignant neoplasm of unspecified part of unspecified bronchus or lung: Secondary | ICD-10-CM

## 2011-05-05 DIAGNOSIS — C3410 Malignant neoplasm of upper lobe, unspecified bronchus or lung: Secondary | ICD-10-CM

## 2011-05-05 DIAGNOSIS — C7951 Secondary malignant neoplasm of bone: Secondary | ICD-10-CM

## 2011-05-05 DIAGNOSIS — C7952 Secondary malignant neoplasm of bone marrow: Secondary | ICD-10-CM

## 2011-05-05 DIAGNOSIS — R918 Other nonspecific abnormal finding of lung field: Secondary | ICD-10-CM

## 2011-05-05 DIAGNOSIS — R52 Pain, unspecified: Secondary | ICD-10-CM

## 2011-05-05 LAB — COMPREHENSIVE METABOLIC PANEL
ALT: 12 U/L (ref 0–35)
AST: 19 U/L (ref 0–37)
Albumin: 3.7 g/dL (ref 3.5–5.2)
Alkaline Phosphatase: 65 U/L (ref 39–117)
Glucose, Bld: 109 mg/dL — ABNORMAL HIGH (ref 70–99)
Potassium: 3.8 mEq/L (ref 3.5–5.3)
Sodium: 142 mEq/L (ref 135–145)
Total Bilirubin: 0.5 mg/dL (ref 0.3–1.2)
Total Protein: 6.3 g/dL (ref 6.0–8.3)

## 2011-05-05 LAB — CBC WITH DIFFERENTIAL/PLATELET
EOS%: 1.4 % (ref 0.0–7.0)
Eosinophils Absolute: 0.1 10*3/uL (ref 0.0–0.5)
LYMPH%: 12.9 % — ABNORMAL LOW (ref 14.0–49.7)
MCH: 31.7 pg (ref 25.1–34.0)
MCHC: 33.1 g/dL (ref 31.5–36.0)
MCV: 95.8 fL (ref 79.5–101.0)
MONO%: 9 % (ref 0.0–14.0)
Platelets: 188 10*3/uL (ref 145–400)
RBC: 3.12 10*6/uL — ABNORMAL LOW (ref 3.70–5.45)
RDW: 13.6 % (ref 11.2–14.5)
nRBC: 0 % (ref 0–0)

## 2011-05-05 MED ORDER — OXYCODONE HCL 20 MG PO TB12: 20.0000 mg | ORAL_TABLET | Freq: Two times a day (BID) | ORAL | Status: DC

## 2011-05-05 NOTE — Progress Notes (Signed)
No images are attached to the encounter. No scans are attached to the encounter. No scans are attached to the encounter. [facility] OFFICE PROGRESS NOTE   DIAGNOSIS: Metastatic non-small cell lung cancer (T2 NX M1). Presented with right upper lobe lung mass as well as multiple pulmonary nodules and bone metastasis at L3 vertebral body. This was diagnosed in March of 2006.  PRIOR THERAPY: 1. Status post palliative radiotherapy to the L3 lesion under the care of Dr. Kathrynn Running completed September 17, 2004. 2. Status post 6 cycles of systemic chemotherapy with carboplatin and paclitaxel.  Last dose was given January 01, 2005. 3. Status post palliative radiotherapy to the right upper lobe lung mass under the care of Dr. Kathrynn Running completed on 12/29/2010. 4. Status post treatment with Tarceva 150 mg p.o. daily started April 2007 through July 2012.   CURRENT THERAPY: 1. Tarceva 100 mg p.o. daily started July 2012. 2.  Zometa 4 mg IV q. 3 months for bone metastasis  INTERVAL HISTORY: Jamie Munoz 77 y.o. female returns for scheduled regular six-week visit for followup of metastatic non-small cell lung cancer. Overall she's tolerating her Tarceva at 100 mg by mouth daily relatively well. She continues to have some scalp lesions as well as mild facial rash related to the Tarceva. She is getting reasonable control with a topical clindamycin. She does occasionally have an odd feeling in her chest when bending over. She describes it as a fleeting choking or tightness type sensation. She requests a refill for her OxyContin.   MEDICAL HISTORY: Past Medical History  Diagnosis Date  . Cancer   . Lung cancer   . Hypertension     ALLERGIES:  is allergic to codeine.  MEDICATIONS:  Current Outpatient Prescriptions  Medication Sig Dispense Refill  . docusate sodium (COLACE) 100 MG capsule Take 100 mg by mouth 2 (two) times daily.        Marland Kitchen erlotinib (TARCEVA) 100 MG tablet Take 100 mg by mouth daily.  Take on an empty stomach 1 hour before meals or 2 hours after       . Ferrous Fum-Iron Polysacch-FA 162-115.2-1 MG CAPS Take 1 capsule by mouth as directed.        . Gabapentin (NEURONTIN PO) Take 200 mg by mouth 3 (three) times daily.        Marland Kitchen lidocaine-prilocaine (EMLA) cream Apply topically as needed.        Marland Kitchen olmesartan (BENICAR) 20 MG tablet Take 20 mg by mouth daily.        Marland Kitchen oxyCODONE (OXYCONTIN) 20 MG 12 hr tablet Take 1 tablet (20 mg total) by mouth every 12 (twelve) hours.  60 tablet  0  . pantoprazole (PROTONIX) 40 MG tablet Take 40 mg by mouth daily.        . prochlorperazine (COMPAZINE) 10 MG tablet Take 10 mg by mouth every 6 (six) hours as needed.        . temazepam (RESTORIL) 15 MG capsule Take 15 mg by mouth at bedtime as needed.        . clindamycin (CLEOCIN T) 1 % lotion         SURGICAL HISTORY: History reviewed. No pertinent past surgical history.  REVIEW OF SYSTEMS:  A comprehensive review of systems was negative except for: Grade 1 skin rash associated with the Tarceva affecting the scalp and face, brief tightness in the chest when bending over.   PHYSICAL EXAMINATION: General appearance: alert, cooperative, appears stated age and no  distress Head: Normocephalic, without obvious abnormality, atraumatic Neck: no adenopathy, no carotid bruit, no JVD, supple, symmetrical, trachea midline and thyroid not enlarged, symmetric, no tenderness/mass/nodules Lymph nodes: Cervical, supraclavicular, and axillary nodes normal. Resp: clear to auscultation bilaterally Cardio: regular rate and rhythm, S1, S2 normal, no murmur, click, rub or gallop GI: soft, non-tender; bowel sounds normal; no masses,  no organomegaly Extremities: extremities normal, atraumatic, no cyanosis or edema  ECOG PERFORMANCE STATUS: 1 - Symptomatic but completely ambulatory  Blood pressure 127/58, pulse 75, temperature 97.9 F (36.6 C), temperature source Oral, height 5\' 6"  (1.676 m), weight 140 lb (63.504  kg).  LABORATORY DATA: Lab Results  Component Value Date   WBC 5.0 05/05/2011   HGB 9.9* 05/05/2011   HCT 29.9* 05/05/2011   MCV 95.8 05/05/2011   PLT 188 05/05/2011      Chemistry      Component Value Date/Time   NA 140 03/22/2011 0859   K 4.1 03/22/2011 0859   CL 104 03/22/2011 0859   CO2 25 03/22/2011 0859   BUN 14 03/22/2011 0859   CREATININE 0.93 03/22/2011 0859      Component Value Date/Time   CALCIUM 8.5 03/22/2011 0859   ALKPHOS 66 03/22/2011 0859   AST 18 03/22/2011 0859   ALT 12 03/22/2011 0859   BILITOT 0.6 03/22/2011 0859       RADIOGRAPHIC STUDIES:  No results found.  ASSESSMENT/PLAN: This is a very pleasant 77 year old white female with metastatic non-small cell lung cancer currently on treatment with Tarceva at 100 mg by mouth daily. Overall she's tolerating this therapy without difficulty. The patient was discussed with Dr. Gwenyth Bouillon. She will be due for Zometa as well as restaging scans when she returns in 6 weeks with repeat CBC differential and C. met. She will have a restaging CT of the chest abdomen and pelvis with contrast when she returns in 6 weeks. She will see Dr. Gwenyth Bouillon at that visit she will discuss the results of the restaging CT scans. She is given a prescription for her OxyContin 20 mg tablets one by mouth every 12 hours as needed for pain a total of 60 with no refill. Of note she usually only takes 1 oxycodone tablet at bedtime.     Conni Slipper, PA-C     All questions were answered. The patient knows to call the clinic with any problems, questions or concerns. We can certainly see the patient much sooner if necessary.

## 2011-05-05 NOTE — Telephone Encounter (Signed)
gve the pt her dec 2012 appt calendar along with the ct scan appt °

## 2011-05-09 ENCOUNTER — Other Ambulatory Visit: Payer: Self-pay | Admitting: Physician Assistant

## 2011-05-09 DIAGNOSIS — C3490 Malignant neoplasm of unspecified part of unspecified bronchus or lung: Secondary | ICD-10-CM

## 2011-05-15 ENCOUNTER — Other Ambulatory Visit: Payer: Self-pay | Admitting: Internal Medicine

## 2011-05-15 DIAGNOSIS — C3490 Malignant neoplasm of unspecified part of unspecified bronchus or lung: Secondary | ICD-10-CM

## 2011-05-16 ENCOUNTER — Ambulatory Visit: Payer: Medicare Other

## 2011-05-17 ENCOUNTER — Other Ambulatory Visit: Payer: Self-pay | Admitting: Internal Medicine

## 2011-05-25 ENCOUNTER — Encounter: Payer: Self-pay | Admitting: *Deleted

## 2011-05-25 ENCOUNTER — Other Ambulatory Visit: Payer: Self-pay | Admitting: *Deleted

## 2011-05-25 DIAGNOSIS — C7951 Secondary malignant neoplasm of bone: Secondary | ICD-10-CM

## 2011-05-25 DIAGNOSIS — C3410 Malignant neoplasm of upper lobe, unspecified bronchus or lung: Secondary | ICD-10-CM

## 2011-05-25 MED ORDER — TEMAZEPAM 15 MG PO CAPS: 15.0000 mg | ORAL_CAPSULE | Freq: Every evening | ORAL | Status: DC | PRN

## 2011-05-25 NOTE — Progress Notes (Signed)
Refill request for Temazepam received from [facility].  Request to providers for review.

## 2011-06-10 ENCOUNTER — Ambulatory Visit (HOSPITAL_COMMUNITY)
Admission: RE | Admit: 2011-06-10 | Discharge: 2011-06-10 | Disposition: A | Discharge status: Home / Self Care | Payer: Medicare Other | Source: Ambulatory Visit | Attending: Physician Assistant | Admitting: Physician Assistant

## 2011-06-10 ENCOUNTER — Other Ambulatory Visit (HOSPITAL_BASED_OUTPATIENT_CLINIC_OR_DEPARTMENT_OTHER): Payer: Medicare Other | Admitting: Lab

## 2011-06-10 ENCOUNTER — Ambulatory Visit (HOSPITAL_BASED_OUTPATIENT_CLINIC_OR_DEPARTMENT_OTHER): Payer: Medicare Other

## 2011-06-10 ENCOUNTER — Other Ambulatory Visit: Payer: Self-pay | Admitting: Physician Assistant

## 2011-06-10 VITALS — BP 146/58 | HR 89 | Temp 96.5°F

## 2011-06-10 DIAGNOSIS — Q441 Other congenital malformations of gallbladder: Secondary | ICD-10-CM | POA: Insufficient documentation

## 2011-06-10 DIAGNOSIS — C3490 Malignant neoplasm of unspecified part of unspecified bronchus or lung: Secondary | ICD-10-CM

## 2011-06-10 DIAGNOSIS — J984 Other disorders of lung: Secondary | ICD-10-CM | POA: Insufficient documentation

## 2011-06-10 DIAGNOSIS — Z9089 Acquired absence of other organs: Secondary | ICD-10-CM | POA: Insufficient documentation

## 2011-06-10 DIAGNOSIS — K5730 Diverticulosis of large intestine without perforation or abscess without bleeding: Secondary | ICD-10-CM | POA: Insufficient documentation

## 2011-06-10 DIAGNOSIS — M899 Disorder of bone, unspecified: Secondary | ICD-10-CM | POA: Insufficient documentation

## 2011-06-10 DIAGNOSIS — Q4479 Other congenital malformations of liver: Secondary | ICD-10-CM | POA: Insufficient documentation

## 2011-06-10 DIAGNOSIS — M949 Disorder of cartilage, unspecified: Secondary | ICD-10-CM | POA: Insufficient documentation

## 2011-06-10 LAB — CBC WITH DIFFERENTIAL/PLATELET
EOS%: 1.3 % (ref 0.0–7.0)
Eosinophils Absolute: 0.1 10*3/uL (ref 0.0–0.5)
LYMPH%: 16.3 % (ref 14.0–49.7)
MCH: 32.3 pg (ref 25.1–34.0)
MCHC: 34.1 g/dL (ref 31.5–36.0)
MCV: 94.9 fL (ref 79.5–101.0)
MONO%: 7.7 % (ref 0.0–14.0)
NEUT#: 4 10*3/uL (ref 1.5–6.5)
Platelets: 190 10*3/uL (ref 145–400)
RBC: 3.15 10*6/uL — ABNORMAL LOW (ref 3.70–5.45)
RDW: 14.4 % (ref 11.2–14.5)

## 2011-06-10 LAB — CMP (CANCER CENTER ONLY)
ALT(SGPT): 19 U/L (ref 10–47)
CO2: 30 mEq/L (ref 18–33)
Chloride: 105 mEq/L (ref 98–108)
Sodium: 140 mEq/L (ref 128–145)
Total Bilirubin: 0.6 mg/dl (ref 0.20–1.60)
Total Protein: 6.6 g/dL (ref 6.4–8.1)

## 2011-06-10 MED ORDER — HEPARIN SOD (PORK) LOCK FLUSH 100 UNIT/ML IV SOLN
500.0000 [IU] | Freq: Once | INTRAVENOUS | Status: AC
  Administered 2011-06-10: 500 [IU] via INTRAVENOUS
  Filled 2011-06-10: qty 5

## 2011-06-10 MED ORDER — SODIUM CHLORIDE 0.9 % IJ SOLN
10.0000 mL | INTRAMUSCULAR | Status: DC | PRN
  Administered 2011-06-10: 10 mL via INTRAVENOUS
  Filled 2011-06-10: qty 10

## 2011-06-10 MED ORDER — IOHEXOL 300 MG/ML  SOLN
100.0000 mL | Freq: Once | INTRAMUSCULAR | Status: AC | PRN
  Administered 2011-06-10: 100 mL via INTRAVENOUS

## 2011-06-10 NOTE — Patient Instructions (Signed)
Call MD for problems 

## 2011-06-10 NOTE — Progress Notes (Signed)
Lab drawn for patient's CT for today. Did not leave accessed since patient does not have a power port. (checked by 2 RN's

## 2011-06-15 ENCOUNTER — Telehealth: Payer: Self-pay | Admitting: Internal Medicine

## 2011-06-15 ENCOUNTER — Encounter: Payer: Self-pay | Admitting: Internal Medicine

## 2011-06-15 ENCOUNTER — Ambulatory Visit (HOSPITAL_BASED_OUTPATIENT_CLINIC_OR_DEPARTMENT_OTHER): Payer: Medicare Other | Admitting: Internal Medicine

## 2011-06-15 DIAGNOSIS — C3490 Malignant neoplasm of unspecified part of unspecified bronchus or lung: Secondary | ICD-10-CM

## 2011-06-15 DIAGNOSIS — Z79899 Other long term (current) drug therapy: Secondary | ICD-10-CM

## 2011-06-15 DIAGNOSIS — F411 Generalized anxiety disorder: Secondary | ICD-10-CM

## 2011-06-15 DIAGNOSIS — F419 Anxiety disorder, unspecified: Secondary | ICD-10-CM

## 2011-06-15 MED ORDER — ALPRAZOLAM 0.25 MG PO TABS: 0.2500 mg | ORAL_TABLET | Freq: Every evening | ORAL | Status: AC | PRN

## 2011-06-15 NOTE — Progress Notes (Signed)
Talked to patient about skin care and tarceva and gave gave her a notebook including managing side effects of tarceva.

## 2011-06-15 NOTE — Telephone Encounter (Signed)
appt made  and printed for 07/14/11    aom

## 2011-06-15 NOTE — Progress Notes (Signed)
Jamie Munoz completed September 17, 2004. 2. Status post 6 cycles of systemic chemotherapy with carboplatin and paclitaxel. Last dose was given January 01, 2005. 3. Status post palliative radiotherapy to the right upper lobe lung mass under the care of Dr. Kathrynn Munoz completed on 12/29/2010. 4. Status post treatment with Tarceva 150 mg p.o. daily started April 2007 through July 2012. 5.  CURRENT THERAPY:  1. Tarceva 100 mg p.o. daily started July 2012. 2. Zometa 4 mg IV q. 3 months for bone metastasis   INTERVAL HISTORY: Jamie Munoz 77 y.o. female returns to the clinic today for followup visit. The patient has no complaints today except for some anxiety. She denied having any significant chest pain or shortness of breath, no cough or hemoptysis, no nausea or vomiting. She is tolerating her treatment was Tarceva fairly well. She has repeat CT scan of the chest, abdomen and pelvis performed recently and she is here for evaluation and discussion of her scan results.  MEDICAL HISTORY: Past Medical History  Diagnosis Date  . Cancer   . Lung cancer   . Hypertension     ALLERGIES:  is allergic to codeine.  MEDICATIONS:  Current Outpatient Prescriptions  Medication Sig Dispense Refill  . CLEOCIN-T 1 % lotion APPLY TO AFFECTED AREAS AS NEEDED AS DIRECTED  1 Bottle  3  . docusate sodium (COLACE) 100 MG capsule Take 100 mg by mouth 2 (two) times daily.        Marland Kitchen erlotinib (TARCEVA) 100 MG tablet Take 100 mg by mouth daily. Take on an empty stomach 1 hour before meals or 2 hours after       . Ferrous Fum-Iron Polysacch-FA 162-115.2-1 MG CAPS Take  1 capsule by mouth as directed.        . Gabapentin (NEURONTIN PO) Take 200 mg by mouth 3 (three) times daily.        Marland Kitchen lidocaine-prilocaine (EMLA) cream Apply topically as needed.        Marland Kitchen NEURONTIN 100 MG capsule TAKE (2) CAPSULES THREE TIMES DAILY.  180 each  1  . olmesartan (BENICAR) 20 MG tablet Take 20 mg by mouth daily.        Marland Kitchen oxyCODONE (OXYCONTIN) 20 MG 12 hr tablet Take 1 tablet (20 mg total) by mouth every 12 (twelve) hours.  60 tablet  0  . pantoprazole (PROTONIX) 40 MG tablet Take 40 mg by mouth daily.        . prochlorperazine (COMPAZINE) 10 MG tablet Take 10 mg by mouth every 6 (six) hours as needed.        . temazepam (RESTORIL) 15 MG capsule Take 1 capsule (15 mg total) by mouth at bedtime as needed.  30 capsule  0  . ALPRAZolam (XANAX) 0.25 MG tablet Take 1 tablet (0.25 mg total) by mouth at bedtime as needed for sleep.  30 tablet  0    REVIEW OF SYSTEMS:  A comprehensive review of systems was negative except for: Constitutional: positive for fatigue Behavioral/Psych: positive for anxiety   PHYSICAL EXAMINATION: General appearance: alert, cooperative and no distress Head: Normocephalic, without obvious abnormality, atraumatic Neck:  no adenopathy Lymph nodes: Cervical, supraclavicular, and axillary nodes normal. Resp: clear to auscultation bilaterally Cardio: regular rate and rhythm, S1, S2 normal, no murmur, click, rub or gallop GI: soft, non-tender; bowel sounds normal; no masses,  no organomegaly Extremities: extremities normal, atraumatic, no cyanosis or edema Neurologic: Alert and oriented X 3, normal strength and tone. Normal symmetric reflexes. Normal coordination and gait  ECOG PERFORMANCE STATUS: 1 - Symptomatic but completely ambulatory  Blood pressure 146/74, pulse 86, temperature 97.1 F (36.2 C), temperature source Oral, height 5\' 6"  (1.676 m), weight 141 lb 3.2 oz (64.048 kg).  LABORATORY DATA: Lab Results  Component Value Date   WBC 5.4 06/10/2011    HGB 10.2* 06/10/2011   HCT 29.9* 06/10/2011   MCV 94.9 06/10/2011   PLT 190 06/10/2011      Chemistry      Component Value Date/Time   NA 142 05/05/2011 0930   NA 142 05/05/2011 0930   K 3.8 05/05/2011 0930   K 3.8 05/05/2011 0930   CL 106 05/05/2011 0930   CL 106 05/05/2011 0930   CO2 28 05/05/2011 0930   CO2 28 05/05/2011 0930   BUN 15 05/05/2011 0930   BUN 15 05/05/2011 0930   CREATININE 0.93 05/05/2011 0930   CREATININE 0.93 05/05/2011 0930      Component Value Date/Time   CALCIUM 8.4 05/05/2011 0930   CALCIUM 8.4 05/05/2011 0930   ALKPHOS 65 05/05/2011 0930   ALKPHOS 65 05/05/2011 0930   AST 19 05/05/2011 0930   AST 19 05/05/2011 0930   ALT 12 05/05/2011 0930   ALT 12 05/05/2011 0930   BILITOT 0.5 05/05/2011 0930   BILITOT 0.5 05/05/2011 0930       RADIOGRAPHIC STUDIES: Ct Chest W Contrast  06/10/2011  *RADIOLOGY REPORT*  Clinical Data:  Restaging lung cancer  CT CHEST, ABDOMEN AND PELVIS WITH CONTRAST  Technique:  Multidetector CT imaging of the chest, abdomen and pelvis was performed following the standard protocol during bolus administration of intravenous contrast.  Contrast: OMNIPAQUE IOHEXOL 300 MG/ML IV SOLN  Comparison:  11/26/2010   CT CHEST  Findings:  No enlarged axillary or supraclavicular lymph nodes.  No enlarged mediastinal or hilar lymph nodes identified.  No pericardial or pleural effusions identified.  This measures 3.2 x 2.6 cm, image 52 of the coronal series. Previously this measured 3.0 x 2.9 cm.  Surrounding scarring and bronchiectasis is identified which may reflect changes of external beam radiation.  Left lung appears clear.  Review of the visualized osseous structures demonstrates a peripherally sclerotic lesion within the proximal sternum measuring 1.1 cm, image 14.  Unchanged from previous exam.  Chronic lesion involving the L1 vertebra is stable measuring 1.4 cm.  IMPRESSION: 1.  Little change compared with previous exam.  Lesion within  the right upper lobe is not significantly changed in size. 2.  Right upper lobe radiation change. 3.  Stable bone lesions.   CT ABDOMEN AND PELVIS  Findings:  No focal liver abnormality identified.  Prior cholecystectomy.  Increased caliber of the CBD appears similar to previous exam.  There is mild intrahepatic biliary dilatation.  The pancreas appears within normal limits.  The adrenal glands are both normal.  Spleen is normal.  Normal appearance of the right kidney.  The left kidney is also normal.  No upper abdominal adenopathy.  There is no pelvic or inguinal adenopathy.  Urinary bladder appears normal.  The stomach and the small bowel loops are all negative.  Sigmoid diverticula noted without acute inflammation.  Review of the visualized osseous structures is significant for mild scoliosis and degenerative disc disease.  Compression fracture involving the L3 vertebra is again noted.  This has been treated with bone cement.  IMPRESSION:  1.  No specific features identified to suggest metastatic disease within the abdomen or pelvis.  Original Report Authenticated By: Rosealee Albee, M.D.     ASSESSMENT: This is a very pleasant 77 years old white female with metastatic non-small cell lung cancer currently on treatment with Tarceva 100 mg by mouth daily. The patient is tolerating her treatment well and she has no evidence for disease progression. I discussed the scan results with the patient.   PLAN: I recommend for her to continue her treatment was Tarceva at the same dose. She would also continue on Zometa 4 mg IV every 3 months. She would come back for followup visit in one month's for reevaluation. For anxiety I started the patient on Xanax 0.25 mg by mouth each bedtime as needed.   All questions were answered. The patient knows to call the clinic with any problems, questions or concerns. We can certainly see the patient much sooner if necessary.

## 2011-06-16 ENCOUNTER — Ambulatory Visit: Payer: Medicare Other

## 2011-07-14 ENCOUNTER — Encounter: Payer: Self-pay | Admitting: Physician Assistant

## 2011-07-14 ENCOUNTER — Other Ambulatory Visit (HOSPITAL_BASED_OUTPATIENT_CLINIC_OR_DEPARTMENT_OTHER): Payer: Medicare Other | Admitting: Lab

## 2011-07-14 ENCOUNTER — Ambulatory Visit (HOSPITAL_BASED_OUTPATIENT_CLINIC_OR_DEPARTMENT_OTHER): Payer: Medicare Other | Admitting: Physician Assistant

## 2011-07-14 VITALS — BP 129/64 | HR 73 | Temp 96.8°F | Ht 66.0 in | Wt 140.6 lb

## 2011-07-14 DIAGNOSIS — C7951 Secondary malignant neoplasm of bone: Secondary | ICD-10-CM

## 2011-07-14 DIAGNOSIS — C3490 Malignant neoplasm of unspecified part of unspecified bronchus or lung: Secondary | ICD-10-CM

## 2011-07-14 DIAGNOSIS — F419 Anxiety disorder, unspecified: Secondary | ICD-10-CM

## 2011-07-14 LAB — COMPREHENSIVE METABOLIC PANEL
Albumin: 3.7 g/dL (ref 3.5–5.2)
BUN: 21 mg/dL (ref 6–23)
Calcium: 8.1 mg/dL — ABNORMAL LOW (ref 8.4–10.5)
Chloride: 108 mEq/L (ref 96–112)
Creatinine, Ser: 1.07 mg/dL (ref 0.50–1.10)
Glucose, Bld: 89 mg/dL (ref 70–99)
Potassium: 3.8 mEq/L (ref 3.5–5.3)

## 2011-07-14 LAB — CBC WITH DIFFERENTIAL/PLATELET
Basophils Absolute: 0 10*3/uL (ref 0.0–0.1)
Eosinophils Absolute: 0.1 10*3/uL (ref 0.0–0.5)
HCT: 28.2 % — ABNORMAL LOW (ref 34.8–46.6)
HGB: 9.6 g/dL — ABNORMAL LOW (ref 11.6–15.9)
MCH: 32.6 pg (ref 25.1–34.0)
MCV: 95.5 fL (ref 79.5–101.0)
NEUT#: 3.6 10*3/uL (ref 1.5–6.5)
NEUT%: 76.8 % (ref 38.4–76.8)
RDW: 15.5 % — ABNORMAL HIGH (ref 11.2–14.5)
lymph#: 0.6 10*3/uL — ABNORMAL LOW (ref 0.9–3.3)

## 2011-07-14 MED ORDER — OXYCODONE HCL 20 MG PO TB12: 20.0000 mg | ORAL_TABLET | Freq: Two times a day (BID) | ORAL | Status: DC

## 2011-07-17 NOTE — Progress Notes (Signed)
[facility] OFFICE PROGRESS NOTE   DIAGNOSIS: Metastatic non-small cell lung cancer (T2 NX M1). Presented with right upper lobe lung mass as well as multiple pulmonary nodules and bone metastasis at L3 vertebral body. This was diagnosed in March of 2006.   PRIOR THERAPY:  1. Status post palliative radiotherapy to the L3 lesion under the care of Dr. Kathrynn Running completed September 17, 2004. 2. Status post 6 cycles of systemic chemotherapy with carboplatin and paclitaxel. Last dose was given January 01, 2005. 3. Status post palliative radiotherapy to the right upper lobe lung mass under the care of Dr. Kathrynn Running completed on 12/29/2010. 4. Status post treatment with Tarceva 150 mg p.o. daily started April 2007 through July 2012. 5.  CURRENT THERAPY:  1. Tarceva 100 mg p.o. daily started July 2012. 2. Zometa 4 mg IV q. 3 months for bone metastasis   INTERVAL HISTORY: Jamie Munoz 78 y.o. female returns to the clinic today for followup visit. The patient has no complaints today except for right thumb pain related to some arthritic changes.. She denied having any significant chest pain or shortness of breath, no cough or hemoptysis, no nausea or vomiting. She is tolerating her treatment was Tarceva fairly well except for mild skin rash affecting her face and scalp. She does get good relief from clindamycin topical solution however may have problems getting this covered by her insurance in the future. She will let us know if we need to come up with an alternative for her. She's had some issues with constipation but not with diarrhea. She requests a refill for her OxyContin.  MEDICAL HISTORY: Past Medical History  Diagnosis Date  . Cancer   . Lung cancer   . Hypertension     ALLERGIES:  is allergic to codeine.  MEDICATIONS:  Current Outpatient Prescriptions  Medication Sig Dispense Refill  . CLEOCIN-T 1 % lotion APPLY TO AFFECTED AREAS AS NEEDED AS DIRECTED  1 Bottle  3  . docusate  sodium (COLACE) 100 MG capsule Take 100 mg by mouth 2 (two) times daily.        Marland Kitchen erlotinib (TARCEVA) 100 MG tablet Take 100 mg by mouth daily. Take on an empty stomach 1 hour before meals or 2 hours after       . Ferrous Fum-Iron Polysacch-FA 162-115.2-1 MG CAPS Take 1 capsule by mouth as directed.        . Gabapentin (NEURONTIN PO) Take 200 mg by mouth 3 (three) times daily.        Marland Kitchen lidocaine-prilocaine (EMLA) cream Apply topically as needed.        Marland Kitchen NEURONTIN 100 MG capsule TAKE (2) CAPSULES THREE TIMES DAILY.  180 each  1  . olmesartan (BENICAR) 20 MG tablet Take 20 mg by mouth daily.        Marland Kitchen oxyCODONE (OXYCONTIN) 20 MG 12 hr tablet Take 1 tablet (20 mg total) by mouth every 12 (twelve) hours.  60 tablet  0  . pantoprazole (PROTONIX) 40 MG tablet Take 40 mg by mouth daily.        . prochlorperazine (COMPAZINE) 10 MG tablet Take 10 mg by mouth every 6 (six) hours as needed.        . temazepam (RESTORIL) 15 MG capsule Take 1 capsule (15 mg total) by mouth at bedtime as needed.  30 capsule  0    REVIEW OF SYSTEMS:  A comprehensive review of systems was negative except for: Constitutional: positive for fatigue Gastrointestinal: positive  for constipation Musculoskeletal: positive for arthralgias and stiff joints   PHYSICAL EXAMINATION: General appearance: alert, cooperative and no distress Head: Normocephalic, without obvious abnormality, atraumatic Neck: no adenopathy Lymph nodes: Cervical, supraclavicular, and axillary nodes normal. Resp: clear to auscultation bilaterally Cardio: regular rate and rhythm, S1, S2 normal, no murmur, click, rub or gallop GI: soft, non-tender; bowel sounds normal; no masses,  no organomegaly Extremities: extremities normal, atraumatic, no cyanosis or edema , right thumb with DJD type deformities Neurologic: Alert and oriented X 3, normal strength and tone. Normal symmetric reflexes. Normal coordination and gait  ECOG PERFORMANCE STATUS: 1 - Symptomatic but  completely ambulatory  Blood pressure 129/64, pulse 73, temperature 96.8 F (36 C), height 5\' 6"  (1.676 m), weight 140 lb 9.6 oz (63.776 kg).  LABORATORY DATA: Lab Results  Component Value Date   WBC 4.7 07/14/2011   HGB 9.6* 07/14/2011   HCT 28.2* 07/14/2011   MCV 95.5 07/14/2011   PLT 188 07/14/2011      Chemistry      Component Value Date/Time   NA 143 07/14/2011 0959   NA 140 06/10/2011 1051   K 3.8 07/14/2011 0959   K 4.1 06/10/2011 1051   CL 108 07/14/2011 0959   CL 105 06/10/2011 1051   CO2 27 07/14/2011 0959   CO2 30 06/10/2011 1051   BUN 21 07/14/2011 0959   BUN 23* 06/10/2011 1051   CREATININE 1.07 07/14/2011 0959   CREATININE 1.1 06/10/2011 1051      Component Value Date/Time   CALCIUM 8.1* 07/14/2011 0959   CALCIUM 7.9* 06/10/2011 1051   ALKPHOS 61 07/14/2011 0959   ALKPHOS 72 06/10/2011 1051   AST 21 07/14/2011 0959   AST 29 06/10/2011 1051   ALT 11 07/14/2011 0959   BILITOT 0.5 07/14/2011 0959   BILITOT 0.60 06/10/2011 1051       RADIOGRAPHIC STUDIES: Ct Chest W Contrast  06/10/2011  *RADIOLOGY REPORT*  Clinical Data:  Restaging lung cancer  CT CHEST, ABDOMEN AND PELVIS WITH CONTRAST  Technique:  Multidetector CT imaging of the chest, abdomen and pelvis was performed following the standard protocol during bolus administration of intravenous contrast.  Contrast: OMNIPAQUE IOHEXOL 300 MG/ML IV SOLN  Comparison:  11/26/2010   CT CHEST  Findings:  No enlarged axillary or supraclavicular lymph nodes.  No enlarged mediastinal or hilar lymph nodes identified.  No pericardial or pleural effusions identified.  This measures 3.2 x 2.6 cm, image 52 of the coronal series. Previously this measured 3.0 x 2.9 cm.  Surrounding scarring and bronchiectasis is identified which may reflect changes of external beam radiation.  Left lung appears clear.  Review of the visualized osseous structures demonstrates a peripherally sclerotic lesion within the proximal sternum measuring 1.1 cm,  image 14.  Unchanged from previous exam.  Chronic lesion involving the L1 vertebra is stable measuring 1.4 cm.  IMPRESSION: 1.  Little change compared with previous exam.  Lesion within the right upper lobe is not significantly changed in size. 2.  Right upper lobe radiation change. 3.  Stable bone lesions.   CT ABDOMEN AND PELVIS  Findings:  No focal liver abnormality identified.  Prior cholecystectomy.  Increased caliber of the CBD appears similar to previous exam.  There is mild intrahepatic biliary dilatation.  The pancreas appears within normal limits.  The adrenal glands are both normal.  Spleen is normal.  Normal appearance of the right kidney.  The left kidney is also normal.  No upper abdominal adenopathy.  There is no pelvic or inguinal adenopathy.  Urinary bladder appears normal.  The stomach and the small bowel loops are all negative.  Sigmoid diverticula noted without acute inflammation.  Review of the visualized osseous structures is significant for mild scoliosis and degenerative disc disease.  Compression fracture involving the L3 vertebra is again noted.  This has been treated with bone cement.  IMPRESSION:  1.  No specific features identified to suggest metastatic disease within the abdomen or pelvis.  Original Report Authenticated By: Rosealee Albee, M.D.     ASSESSMENT: This is a very pleasant 78 years old white female with metastatic non-small cell lung cancer currently on treatment with Tarceva 100 mg by mouth daily. The patient is tolerating her treatment well. She does not have any evidence of disease progression as of her last CT scan. The patient was discussed with Dr. Arbutus Ped. She will continue on Tarceva at 100 mg by mouth daily. She will be due for Zometa when she returns in one month with a repeat symptom management visit with a CBC differential and C. met. She will let us know if we need to change her topical skin treatment from clindamycin to an alternative to comply with her  insurance coverage. To address her joint discomfort she may take ibuprofen or Aleve in the evenings with her PPI.  Conni Slipper, PA-C    All questions were answered. The patient knows to call the clinic with any problems, questions or concerns. We can certainly see the patient much sooner if necessary.

## 2011-07-20 ENCOUNTER — Other Ambulatory Visit: Payer: Self-pay | Admitting: Physician Assistant

## 2011-07-20 DIAGNOSIS — C3490 Malignant neoplasm of unspecified part of unspecified bronchus or lung: Secondary | ICD-10-CM

## 2011-07-22 ENCOUNTER — Telehealth: Payer: Self-pay | Admitting: Internal Medicine

## 2011-07-22 NOTE — Telephone Encounter (Signed)
Pt states she was checking her cell phone and received a voice mail from [place] , but did not understand the message. I told her i do not know why she received call and I will forward to [facility]

## 2011-07-25 ENCOUNTER — Telehealth: Payer: Self-pay | Admitting: *Deleted

## 2011-07-25 NOTE — Telephone Encounter (Signed)
Called patient last week due to warning with Tarceva refill.  Interaction with protonix appeared so this nurse called patient for clarification.  Ms. Sochacki reviewed her med bottles and does have protonix.  Instructed her o stop this and to try zantac or pepcid but she may not take protonix due to being on Tarceva.  Reports Dr. Arlyce Dice prescribed the protonix and she will stop this.  Does not recall any reflux or indigestion problems.

## 2011-07-26 ENCOUNTER — Other Ambulatory Visit: Payer: Self-pay | Admitting: Internal Medicine

## 2011-07-26 DIAGNOSIS — C7951 Secondary malignant neoplasm of bone: Secondary | ICD-10-CM

## 2011-08-11 ENCOUNTER — Ambulatory Visit (HOSPITAL_BASED_OUTPATIENT_CLINIC_OR_DEPARTMENT_OTHER): Payer: Medicare Other

## 2011-08-11 ENCOUNTER — Ambulatory Visit: Payer: Medicare Other | Admitting: Physician Assistant

## 2011-08-11 ENCOUNTER — Other Ambulatory Visit: Payer: Medicare Other

## 2011-08-11 ENCOUNTER — Encounter: Payer: Self-pay | Admitting: Physician Assistant

## 2011-08-11 VITALS — BP 118/59 | HR 78 | Temp 96.8°F | Ht 66.0 in | Wt 139.0 lb

## 2011-08-11 DIAGNOSIS — C3410 Malignant neoplasm of upper lobe, unspecified bronchus or lung: Secondary | ICD-10-CM

## 2011-08-11 DIAGNOSIS — C3490 Malignant neoplasm of unspecified part of unspecified bronchus or lung: Secondary | ICD-10-CM

## 2011-08-11 LAB — COMPREHENSIVE METABOLIC PANEL
Albumin: 3.7 g/dL (ref 3.5–5.2)
Alkaline Phosphatase: 72 U/L (ref 39–117)
CO2: 25 mEq/L (ref 19–32)
Calcium: 8.4 mg/dL (ref 8.4–10.5)
Chloride: 105 mEq/L (ref 96–112)
Glucose, Bld: 87 mg/dL (ref 70–99)
Potassium: 4.3 mEq/L (ref 3.5–5.3)
Sodium: 139 mEq/L (ref 135–145)
Total Protein: 6.4 g/dL (ref 6.0–8.3)

## 2011-08-11 LAB — CBC WITH DIFFERENTIAL/PLATELET
Basophils Absolute: 0 10*3/uL (ref 0.0–0.1)
EOS%: 1.2 % (ref 0.0–7.0)
Eosinophils Absolute: 0.1 10*3/uL (ref 0.0–0.5)
HCT: 31.1 % — ABNORMAL LOW (ref 34.8–46.6)
HGB: 10.4 g/dL — ABNORMAL LOW (ref 11.6–15.9)
MONO#: 0.5 10*3/uL (ref 0.1–0.9)
NEUT#: 5.8 10*3/uL (ref 1.5–6.5)
RDW: 14.5 % (ref 11.2–14.5)
WBC: 7.2 10*3/uL (ref 3.9–10.3)
lymph#: 0.8 10*3/uL — ABNORMAL LOW (ref 0.9–3.3)

## 2011-08-11 MED ORDER — HEPARIN SOD (PORK) LOCK FLUSH 100 UNIT/ML IV SOLN
250.0000 [IU] | Freq: Once | INTRAVENOUS | Status: DC | PRN
  Filled 2011-08-11: qty 5

## 2011-08-11 MED ORDER — ZOLEDRONIC ACID 4 MG/100ML IV SOLN
4.0000 mg | Freq: Once | INTRAVENOUS | Status: AC
  Administered 2011-08-11: 4 mg via INTRAVENOUS
  Filled 2011-08-11: qty 100

## 2011-08-11 MED ORDER — SODIUM CHLORIDE 0.9 % IV SOLN
Freq: Once | INTRAVENOUS | Status: AC
  Administered 2011-08-11: 12:00:00 via INTRAVENOUS

## 2011-08-11 MED ORDER — SODIUM CHLORIDE 0.9 % IJ SOLN
10.0000 mL | INTRAMUSCULAR | Status: DC | PRN
  Filled 2011-08-11: qty 10

## 2011-08-11 MED ORDER — HEPARIN SOD (PORK) LOCK FLUSH 100 UNIT/ML IV SOLN
500.0000 [IU] | Freq: Once | INTRAVENOUS | Status: AC | PRN
  Administered 2011-08-11: 500 [IU]
  Filled 2011-08-11: qty 5

## 2011-08-12 ENCOUNTER — Ambulatory Visit: Payer: Medicare Other | Admitting: Physician Assistant

## 2011-08-12 ENCOUNTER — Other Ambulatory Visit: Payer: Medicare Other | Admitting: Lab

## 2011-08-12 NOTE — Progress Notes (Signed)
[facility] OFFICE PROGRESS NOTE   DIAGNOSIS: Metastatic non-small cell lung cancer (T2 NX M1). Presented with right upper lobe lung mass as well as multiple pulmonary nodules and bone metastasis at L3 vertebral body. This was diagnosed in March of 2006.   PRIOR THERAPY:  1. Status post palliative radiotherapy to the L3 lesion under the care of Dr. Kathrynn Running completed September 17, 2004. 2. Status post 6 cycles of systemic chemotherapy with carboplatin and paclitaxel. Last dose was given January 01, 2005. 3. Status post palliative radiotherapy to the right upper lobe lung mass under the care of Dr. Kathrynn Running completed on 12/29/2010. 4. Status post treatment with Tarceva 150 mg p.o. daily started April 2007 through July 2012. 5.  CURRENT THERAPY:  1. Tarceva 100 mg p.o. daily started July 2012. 2. Zometa 4 mg IV q. 3 months for bone metastasis   INTERVAL HISTORY: Jamie Munoz 78 y.o. female returns to the clinic today for followup visit. The patient has no complaints today except for some lesions in her scalp again related to the Tarceva.. She denied having any significant chest pain or shortness of breath, no cough or hemoptysis, no nausea or vomiting. She is tolerating her treatment was Tarceva fairly well except for mild skin rash affecting her face and scalp. She does get good relief from clindamycin topical solution.  MEDICAL HISTORY: Past Medical History  Diagnosis Date  . Cancer   . Lung cancer   . Hypertension     ALLERGIES:  is allergic to codeine.  MEDICATIONS:  Current Outpatient Prescriptions  Medication Sig Dispense Refill  . CLEOCIN-T 1 % lotion APPLY TO AFFECTED AREAS AS NEEDED AS DIRECTED  1 Bottle  3  . docusate sodium (COLACE) 100 MG capsule Take 100 mg by mouth 2 (two) times daily.        . Ferrous Fum-Iron Polysacch-FA 162-115.2-1 MG CAPS Take 1 capsule by mouth as directed.        . Gabapentin (NEURONTIN PO) Take 200 mg by mouth 3 (three) times daily.         Marland Kitchen lidocaine-prilocaine (EMLA) cream Apply topically as needed.        Marland Kitchen NEURONTIN 100 MG capsule TAKE (2) CAPSULES THREE TIMES DAILY.  180 each  2  . olmesartan (BENICAR) 20 MG tablet Take 20 mg by mouth daily.        Marland Kitchen oxyCODONE (OXYCONTIN) 20 MG 12 hr tablet Take 1 tablet (20 mg total) by mouth every 12 (twelve) hours.  60 tablet  0  . prochlorperazine (COMPAZINE) 10 MG tablet Take 10 mg by mouth every 6 (six) hours as needed.        Marland Kitchen TARCEVA 100 MG tablet TAKE 1 TABLET DAILY 1 TO 2 HOURS BEFORE A MEAL  30 each  1  . temazepam (RESTORIL) 15 MG capsule Take 1 capsule (15 mg total) by mouth at bedtime as needed.  30 capsule  0    REVIEW OF SYSTEMS:  A comprehensive review of systems was negative except for: Constitutional: positive for fatigue Integument/breast: positive for rash   PHYSICAL EXAMINATION: General appearance: alert, cooperative and no distress Head: Normocephalic, without obvious abnormality, atraumatic Neck: no adenopathy Lymph nodes: Cervical, supraclavicular, and axillary nodes normal. Resp: clear to auscultation bilaterally Cardio: regular rate and rhythm, S1, S2 normal, no murmur, click, rub or gallop GI: soft, non-tender; bowel sounds normal; no masses,  no organomegaly Extremities: extremities normal, atraumatic, no cyanosis or edema , right thumb with  DJD type deformities Neurologic: Alert and oriented X 3, normal strength and tone. Normal symmetric reflexes. Normal coordination and gait  ECOG PERFORMANCE STATUS: 1 - Symptomatic but completely ambulatory  Blood pressure 118/59, pulse 78, temperature 96.8 F (36 C), temperature source Oral, height 5\' 6"  (1.676 m), weight 139 lb (63.05 kg).  LABORATORY DATA: Lab Results  Component Value Date   WBC 7.2 08/11/2011   HGB 10.4* 08/11/2011   HCT 31.1* 08/11/2011   MCV 94.2 08/11/2011   PLT 197 08/11/2011      Chemistry      Component Value Date/Time   NA 139 08/11/2011 1000   NA 140 06/10/2011 1051   K 4.3  08/11/2011 1000   K 4.1 06/10/2011 1051   CL 105 08/11/2011 1000   CL 105 06/10/2011 1051   CO2 25 08/11/2011 1000   CO2 30 06/10/2011 1051   BUN 25* 08/11/2011 1000   BUN 23* 06/10/2011 1051   CREATININE 1.03 08/11/2011 1000   CREATININE 1.1 06/10/2011 1051      Component Value Date/Time   CALCIUM 8.4 08/11/2011 1000   CALCIUM 7.9* 06/10/2011 1051   ALKPHOS 72 08/11/2011 1000   ALKPHOS 72 06/10/2011 1051   AST 16 08/11/2011 1000   AST 29 06/10/2011 1051   ALT 10 08/11/2011 1000   BILITOT 0.6 08/11/2011 1000   BILITOT 0.60 06/10/2011 1051       RADIOGRAPHIC STUDIES: Ct Chest W Contrast  06/10/2011  *RADIOLOGY REPORT*  Clinical Data:  Restaging lung cancer  CT CHEST, ABDOMEN AND PELVIS WITH CONTRAST  Technique:  Multidetector CT imaging of the chest, abdomen and pelvis was performed following the standard protocol during bolus administration of intravenous contrast.  Contrast: OMNIPAQUE IOHEXOL 300 MG/ML IV SOLN  Comparison:  11/26/2010   CT CHEST  Findings:  No enlarged axillary or supraclavicular lymph nodes.  No enlarged mediastinal or hilar lymph nodes identified.  No pericardial or pleural effusions identified.  This measures 3.2 x 2.6 cm, image 52 of the coronal series. Previously this measured 3.0 x 2.9 cm.  Surrounding scarring and bronchiectasis is identified which may reflect changes of external beam radiation.  Left lung appears clear.  Review of the visualized osseous structures demonstrates a peripherally sclerotic lesion within the proximal sternum measuring 1.1 cm, image 14.  Unchanged from previous exam.  Chronic lesion involving the L1 vertebra is stable measuring 1.4 cm.  IMPRESSION: 1.  Little change compared with previous exam.  Lesion within the right upper lobe is not significantly changed in size. 2.  Right upper lobe radiation change. 3.  Stable bone lesions.   CT ABDOMEN AND PELVIS  Findings:  No focal liver abnormality identified.  Prior cholecystectomy.  Increased  caliber of the CBD appears similar to previous exam.  There is mild intrahepatic biliary dilatation.  The pancreas appears within normal limits.  The adrenal glands are both normal.  Spleen is normal.  Normal appearance of the right kidney.  The left kidney is also normal.  No upper abdominal adenopathy.  There is no pelvic or inguinal adenopathy.  Urinary bladder appears normal.  The stomach and the small bowel loops are all negative.  Sigmoid diverticula noted without acute inflammation.  Review of the visualized osseous structures is significant for mild scoliosis and degenerative disc disease.  Compression fracture involving the L3 vertebra is again noted.  This has been treated with bone cement.  IMPRESSION:  1.  No specific features identified to suggest metastatic disease  within the abdomen or pelvis.  Original Report Authenticated By: Rosealee Albee, M.D.     ASSESSMENT: This is a very pleasant 78 years old white female with metastatic non-small cell lung cancer currently on treatment with Tarceva 100 mg by mouth daily. The patient is tolerating her treatment well. She does not have any evidence of disease progression as of her last CT scan. The patient was discussed with Dr. Arbutus Ped. She will continue on Tarceva at 100 mg by mouth daily. She will proceed with her scheduled  Zometa infusion today. She will followup with Dr. Arbutus Ped in one month with a repeat CBC differential C. met and CT of the chest abdomen and pelvis with contrast to reevaluate her disease. She's not scarring prescription refills today   Conni Slipper, PA-C    All questions were answered. The patient knows to call the clinic with any problems, questions or concerns. We can certainly see the patient much sooner if necessary.

## 2011-09-02 ENCOUNTER — Ambulatory Visit (HOSPITAL_COMMUNITY)
Admission: RE | Admit: 2011-09-02 | Discharge: 2011-09-02 | Disposition: A | Discharge status: Home / Self Care | Payer: Medicare Other | Source: Ambulatory Visit | Attending: Physician Assistant | Admitting: Physician Assistant

## 2011-09-02 ENCOUNTER — Other Ambulatory Visit (HOSPITAL_BASED_OUTPATIENT_CLINIC_OR_DEPARTMENT_OTHER): Payer: Medicare Other

## 2011-09-02 DIAGNOSIS — C3490 Malignant neoplasm of unspecified part of unspecified bronchus or lung: Secondary | ICD-10-CM

## 2011-09-02 DIAGNOSIS — C7951 Secondary malignant neoplasm of bone: Secondary | ICD-10-CM | POA: Insufficient documentation

## 2011-09-02 DIAGNOSIS — C7952 Secondary malignant neoplasm of bone marrow: Secondary | ICD-10-CM | POA: Insufficient documentation

## 2011-09-02 DIAGNOSIS — Z90710 Acquired absence of both cervix and uterus: Secondary | ICD-10-CM | POA: Insufficient documentation

## 2011-09-02 DIAGNOSIS — C3410 Malignant neoplasm of upper lobe, unspecified bronchus or lung: Secondary | ICD-10-CM

## 2011-09-02 LAB — CMP (CANCER CENTER ONLY)
AST: 25 U/L (ref 11–38)
Albumin: 3.1 g/dL — ABNORMAL LOW (ref 3.3–5.5)
Alkaline Phosphatase: 71 U/L (ref 26–84)
Glucose, Bld: 109 mg/dL (ref 73–118)
Potassium: 3.9 mEq/L (ref 3.3–4.7)
Sodium: 139 mEq/L (ref 128–145)
Total Protein: 6.7 g/dL (ref 6.4–8.1)

## 2011-09-02 LAB — CBC WITH DIFFERENTIAL/PLATELET
EOS%: 2.3 % (ref 0.0–7.0)
Eosinophils Absolute: 0.1 10*3/uL (ref 0.0–0.5)
MCV: 96.7 fL (ref 79.5–101.0)
MONO%: 8.9 % (ref 0.0–14.0)
NEUT#: 3.3 10*3/uL (ref 1.5–6.5)
RBC: 3.18 10*6/uL — ABNORMAL LOW (ref 3.70–5.45)
RDW: 15.4 % — ABNORMAL HIGH (ref 11.2–14.5)

## 2011-09-02 MED ORDER — IOHEXOL 300 MG/ML  SOLN
100.0000 mL | Freq: Once | INTRAMUSCULAR | Status: AC | PRN
  Administered 2011-09-02: 100 mL via INTRAVENOUS

## 2011-09-08 ENCOUNTER — Ambulatory Visit: Payer: Medicare Other

## 2011-09-08 ENCOUNTER — Other Ambulatory Visit: Payer: Self-pay | Admitting: Medical Oncology

## 2011-09-08 ENCOUNTER — Ambulatory Visit (HOSPITAL_BASED_OUTPATIENT_CLINIC_OR_DEPARTMENT_OTHER): Payer: Medicare Other | Admitting: Internal Medicine

## 2011-09-08 VITALS — BP 189/64 | HR 89 | Temp 97.0°F | Ht 66.0 in | Wt 139.8 lb

## 2011-09-08 DIAGNOSIS — C3490 Malignant neoplasm of unspecified part of unspecified bronchus or lung: Secondary | ICD-10-CM

## 2011-09-08 DIAGNOSIS — R911 Solitary pulmonary nodule: Secondary | ICD-10-CM

## 2011-09-08 DIAGNOSIS — C7951 Secondary malignant neoplasm of bone: Secondary | ICD-10-CM

## 2011-09-08 DIAGNOSIS — C3410 Malignant neoplasm of upper lobe, unspecified bronchus or lung: Secondary | ICD-10-CM

## 2011-09-08 NOTE — Telephone Encounter (Signed)
gv pt appt schedule for April thru nov.

## 2011-09-08 NOTE — Progress Notes (Signed)
Pt saw MD today but did not receive a flush.

## 2011-09-09 NOTE — Progress Notes (Signed)
[facility] [phone]   Fax:([phone]  OFFICE PROGRESS NOTE  [name], MD, MD [address] [place] [address]  DIAGNOSIS: Metastatic non-small cell lung cancer (T2 NX M1). Presented with right upper lobe lung mass as well as multiple pulmonary nodules and bone metastasis at L3 vertebral body. This was diagnosed in March of 2006.   PRIOR THERAPY:  1. Status post palliative radiotherapy to the L3 lesion under the care of Dr. Kathrynn Running completed September 17, 2004. 2. Status post 6 cycles of systemic chemotherapy with carboplatin and paclitaxel. Last dose was given January 01, 2005. 3. Status post palliative radiotherapy to the right upper lobe lung mass under the care of Dr. Kathrynn Running completed on 12/29/2010. 4. Status post treatment with Tarceva 150 mg p.o. daily started April 2007 through July 2012. CUCURRENT THERAPY:  1. Tarceva 100 mg p.o. daily started July 2012. 2. Zometa 4 mg IV q. 3 months for bone metastasis  INTERVAL HISTORY: Jamie Munoz 78 y.o. female returns to the clinic today for followup visit. The patient is tolerating her treatment with Tarceva fairly well. She denied having any significant skin rash except for a few areas in her scalp. She denied having any significant diarrhea. The patient denied having any significant chest pain or shortness of breath, no cough or hemoptysis. No weight loss or night sweats. She has repeat CT scan of the chest, abdomen and pelvis performed recently and she is here today for evaluation and discussion of her scan results.  MEDICAL HISTORY: Past Medical History  Diagnosis Date  . Cancer   . Lung cancer   . Hypertension     ALLERGIES:  is allergic to codeine.  MEDICATIONS:  Current Outpatient Prescriptions  Medication Sig Dispense Refill  . ALPRAZolam (XANAX) 0.25 MG tablet Take 0.25 mg by mouth at bedtime as needed.      Marland Kitchen CLEOCIN-T 1 % lotion APPLY TO AFFECTED AREAS AS NEEDED AS DIRECTED  1  Bottle  3  . docusate sodium (COLACE) 100 MG capsule Take 100 mg by mouth 2 (two) times daily.        . Gabapentin (NEURONTIN PO) Take 200 mg by mouth 3 (three) times daily.        Marland Kitchen lidocaine-prilocaine (EMLA) cream Apply topically as needed.        Marland Kitchen NEURONTIN 100 MG capsule TAKE (2) CAPSULES THREE TIMES DAILY.  180 each  2  . olmesartan (BENICAR) 20 MG tablet Take 20 mg by mouth daily.        Marland Kitchen oxyCODONE (OXYCONTIN) 20 MG 12 hr tablet Take 1 tablet (20 mg total) by mouth every 12 (twelve) hours.  60 tablet  0  . TARCEVA 100 MG tablet TAKE 1 TABLET DAILY 1 TO 2 HOURS BEFORE A MEAL  30 each  1  . temazepam (RESTORIL) 15 MG capsule Take 1 capsule (15 mg total) by mouth at bedtime as needed.  30 capsule  0    REVIEW OF SYSTEMS:  A comprehensive review of systems was negative.   PHYSICAL EXAMINATION: General appearance: alert, cooperative and no distress Head: Normocephalic, without obvious abnormality, atraumatic Neck: no adenopathy Lymph nodes: Cervical, supraclavicular, and axillary nodes normal. Resp: clear to auscultation bilaterally Cardio: regular rate and rhythm, S1, S2 normal, no murmur, click, rub or gallop GI: soft, non-tender; bowel sounds normal; no masses,  no organomegaly Extremities: extremities normal, atraumatic, no cyanosis or edema Neurologic: Alert and oriented X 3, normal  strength and tone. Normal symmetric reflexes. Normal coordination and gait  ECOG PERFORMANCE STATUS: 0 - Asymptomatic  Blood pressure 189/64, pulse 89, temperature 97 F (36.1 C), height 5\' 6"  (1.676 m), weight 139 lb 12.8 oz (63.413 kg).  LABORATORY DATA: Lab Results  Component Value Date   WBC 4.6 09/02/2011   HGB 10.5* 09/02/2011   HCT 30.7* 09/02/2011   MCV 96.7 09/02/2011   PLT 224 09/02/2011      Chemistry      Component Value Date/Time   NA 139 09/02/2011 1406   NA 139 08/11/2011 1000   K 3.9 09/02/2011 1406   K 4.3 08/11/2011 1000   CL 97* 09/02/2011 1406   CL 105 08/11/2011 1000   CO2  29 09/02/2011 1406   CO2 25 08/11/2011 1000   BUN 18 09/02/2011 1406   BUN 25* 08/11/2011 1000   CREATININE 1.2 09/02/2011 1406   CREATININE 1.03 08/11/2011 1000      Component Value Date/Time   CALCIUM 8.0 09/02/2011 1406   CALCIUM 8.4 08/11/2011 1000   ALKPHOS 71 09/02/2011 1406   ALKPHOS 72 08/11/2011 1000   AST 25 09/02/2011 1406   AST 16 08/11/2011 1000   ALT 10 08/11/2011 1000   BILITOT 0.60 09/02/2011 1406   BILITOT 0.6 08/11/2011 1000       RADIOGRAPHIC STUDIES: Ct Chest W Contrast  09/02/2011  *RADIOLOGY REPORT*  Clinical Data:  Lung cancer.  Oral chemotherapy ongoing.  CT CHEST, ABDOMEN AND PELVIS WITH CONTRAST  Technique:  Multidetector CT imaging of the chest, abdomen and pelvis was performed following the standard protocol during bolus administration of intravenous contrast.  Contrast: OMNIPAQUE IOHEXOL 300 MG/ML IJ SOLN  Comparison:  CT 06/10/2011, PET CT 11/26/2010  CT CHEST  Findings:  In the right upper lobe, the lobular mass measures 37 mm in craniocaudad dimension compared to 35 mm on prior.  The inferior portion of this mass appears increased in size measuring 27 x 17 mm compared to 23 x 16 mm on prior.  The superior portion is similar measuring 29 x 15 mm (image 12) compared to 26 by 15 mm on prior.  There is no new pulmonary lesions are present.  No axillary or supraclavicular lymphadenopathy.  No mediastinal lymphadenopathy.  No pericardial fluid.  Esophagus is normal.  IMPRESSION:  1. Concern for an increase in size of the inferior portion of the lobular right upper lobe mass suggesting mild disease progression. 2.  No new lesions in the thorax. 3.  No evidence of mediastinal metastasis.  CT ABDOMEN AND PELVIS  Findings:  No focal hepatic lesion.  Mild biliary ductal dilatation is  similar to prior and likely relates to cholecystectomy.  The pancreas, spleen, adrenal glands, and kidneys are normal.  The stomach, small bowel, and colon are normal.  There is a moderate volume  stool right colon is similar to prior.  No evidence of obstructing lesion.  No retroperitoneal periportal lymphadenopathy.  No free fluid the pelvis.  Post hysterectomy anatomy.  No pelvic lymphadenopathy.  There is a sclerotic lesions within the sacrum and spine which are not changed from prior.  IMPRESSION:  1.  No evidence metastasis within the abdomen or pelvis soft tissues. 2.  Stable sclerotic metastasis in the spine and sternum.  Original Report Authenticated By: Genevive Bi, M.D.   Ct Abdomen Pelvis W Contrast  09/02/2011  *RADIOLOGY REPORT*  Clinical Data:  Lung cancer.  Oral chemotherapy ongoing.  CT CHEST, ABDOMEN AND PELVIS  WITH CONTRAST  Technique:  Multidetector CT imaging of the chest, abdomen and pelvis was performed following the standard protocol during bolus administration of intravenous contrast.  Contrast: OMNIPAQUE IOHEXOL 300 MG/ML IJ SOLN  Comparison:  CT 06/10/2011, PET CT 11/26/2010  CT CHEST  Findings:  In the right upper lobe, the lobular mass measures 37 mm in craniocaudad dimension compared to 35 mm on prior.  The inferior portion of this mass appears increased in size measuring 27 x 17 mm compared to 23 x 16 mm on prior.  The superior portion is similar measuring 29 x 15 mm (image 12) compared to 26 by 15 mm on prior.  There is no new pulmonary lesions are present.  No axillary or supraclavicular lymphadenopathy.  No mediastinal lymphadenopathy.  No pericardial fluid.  Esophagus is normal.  IMPRESSION:  1. Concern for an increase in size of the inferior portion of the lobular right upper lobe mass suggesting mild disease progression. 2.  No new lesions in the thorax. 3.  No evidence of mediastinal metastasis.  CT ABDOMEN AND PELVIS  Findings:  No focal hepatic lesion.  Mild biliary ductal dilatation is  similar to prior and likely relates to cholecystectomy.  The pancreas, spleen, adrenal glands, and kidneys are normal.  The stomach, small bowel, and colon are normal.   There is a moderate volume stool right colon is similar to prior.  No evidence of obstructing lesion.  No retroperitoneal periportal lymphadenopathy.  No free fluid the pelvis.  Post hysterectomy anatomy.  No pelvic lymphadenopathy.  There is a sclerotic lesions within the sacrum and spine which are not changed from prior.  IMPRESSION:  1.  No evidence metastasis within the abdomen or pelvis soft tissues. 2.  Stable sclerotic metastasis in the spine and sternum.  Original Report Authenticated By: Genevive Bi, M.D.    ASSESSMENT: This is a very pleasant 78 years old white female with metastatic non-small cell lung cancer currently on treatment with oral Tarceva 100 mg by mouth daily. The patient is tolerating her treatment fairly well she has no significant evidence for disease progression but mild increase in the right upper lobe mass.   PLAN: I discussed the scan results with the patient. I recommended for her to continue on treatment with Tarceva at the current dose for now. I would monitor her closely with repeat CT scan of the chest, abdomen and pelvis in 3 months.  She will also continue her treatment with Zometa every 3 months. The patient will have a flush of her Port-A-Cath every 6 weeks. She would come back for followup visit in one month for reevaluation. All questions were answered. The patient knows to call the clinic with any problems, questions or concerns. We can certainly see the patient much sooner if necessary.

## 2011-09-12 ENCOUNTER — Other Ambulatory Visit: Payer: Self-pay | Admitting: Medical Oncology

## 2011-09-12 DIAGNOSIS — C3490 Malignant neoplasm of unspecified part of unspecified bronchus or lung: Secondary | ICD-10-CM

## 2011-09-12 MED ORDER — OXYCODONE HCL 20 MG PO TB12: 20.0000 mg | ORAL_TABLET | Freq: Two times a day (BID) | ORAL | Status: DC

## 2011-09-12 NOTE — Telephone Encounter (Signed)
Pt called to mail her oxycontin refill. Done

## 2011-09-16 ENCOUNTER — Telehealth: Payer: Self-pay | Admitting: *Deleted

## 2011-09-16 NOTE — Telephone Encounter (Signed)
Pt called stating she has a head cold and wants to know what Dr Donnald Garre recommends while on tarceva.  Per AJ, okay to take tylenol cold and sinus or robitussen.  Pt verbalized understanding.  SLJ

## 2011-09-19 ENCOUNTER — Other Ambulatory Visit: Payer: Self-pay | Admitting: Physician Assistant

## 2011-09-19 DIAGNOSIS — C3490 Malignant neoplasm of unspecified part of unspecified bronchus or lung: Secondary | ICD-10-CM

## 2011-10-06 ENCOUNTER — Ambulatory Visit (HOSPITAL_BASED_OUTPATIENT_CLINIC_OR_DEPARTMENT_OTHER): Payer: Medicare Other | Admitting: Physician Assistant

## 2011-10-06 ENCOUNTER — Telehealth: Payer: Self-pay | Admitting: Internal Medicine

## 2011-10-06 ENCOUNTER — Other Ambulatory Visit (HOSPITAL_BASED_OUTPATIENT_CLINIC_OR_DEPARTMENT_OTHER): Payer: Medicare Other | Admitting: Lab

## 2011-10-06 ENCOUNTER — Encounter: Payer: Self-pay | Admitting: Physician Assistant

## 2011-10-06 VITALS — BP 150/62 | HR 76 | Temp 96.9°F | Ht 66.0 in | Wt 142.8 lb

## 2011-10-06 DIAGNOSIS — C3490 Malignant neoplasm of unspecified part of unspecified bronchus or lung: Secondary | ICD-10-CM

## 2011-10-06 DIAGNOSIS — C7951 Secondary malignant neoplasm of bone: Secondary | ICD-10-CM

## 2011-10-06 DIAGNOSIS — K5900 Constipation, unspecified: Secondary | ICD-10-CM

## 2011-10-06 LAB — CBC WITH DIFFERENTIAL/PLATELET
EOS%: 2 % (ref 0.0–7.0)
MCH: 32.8 pg (ref 25.1–34.0)
MCHC: 33.6 g/dL (ref 31.5–36.0)
MCV: 97.6 fL (ref 79.5–101.0)
MONO%: 8.8 % (ref 0.0–14.0)
RBC: 3.07 10*6/uL — ABNORMAL LOW (ref 3.70–5.45)
RDW: 14.7 % — ABNORMAL HIGH (ref 11.2–14.5)
nRBC: 0 % (ref 0–0)

## 2011-10-06 LAB — COMPREHENSIVE METABOLIC PANEL
ALT: 12 U/L (ref 0–35)
Alkaline Phosphatase: 64 U/L (ref 39–117)
Glucose, Bld: 91 mg/dL (ref 70–99)
Sodium: 144 mEq/L (ref 135–145)
Total Bilirubin: 0.7 mg/dL (ref 0.3–1.2)
Total Protein: 6.3 g/dL (ref 6.0–8.3)

## 2011-10-06 NOTE — Telephone Encounter (Signed)
Pt given appt schedule for may thru nov prior to leaving today (rose).

## 2011-10-06 NOTE — Progress Notes (Signed)
[facility] [phone]   Fax:([phone]  OFFICE PROGRESS NOTE  [name], MD, MD [address] [place] [address]  DIAGNOSIS: Metastatic non-small cell lung cancer (T2 NX M1). Presented with right upper lobe lung mass as well as multiple pulmonary nodules and bone metastasis at L3 vertebral body. This was diagnosed in March of 2006.   PRIOR THERAPY:  1. Status post palliative radiotherapy to the L3 lesion under the care of Dr. Kathrynn Running completed September 17, 2004. 2. Status post 6 cycles of systemic chemotherapy with carboplatin and paclitaxel. Last dose was given January 01, 2005. 3. Status post palliative radiotherapy to the right upper lobe lung mass under the care of Dr. Kathrynn Running completed on 12/29/2010. 4. Status post treatment with Tarceva 150 mg p.o. daily started April 2007 through July 2012. CUCURRENT THERAPY:  1. Tarceva 100 mg p.o. daily started July 2012. 2. Zometa 4 mg IV q. 3 months for bone metastasis  INTERVAL HISTORY: Jamie Munoz 78 y.o. female returns to the clinic today for followup visit. She is tolerating her treatment with Tarceva fairly well, except for a few areas of skin rash in her scalp. She denied having any significant diarrhea but is having some constipation. The patient denied having any significant chest pain or shortness of breath, no cough or hemoptysis. No weight loss or night sweats. Her Port-A-Cath was last flushed 09/08/2011.  MEDICAL HISTORY: Past Medical History  Diagnosis Date  . Cancer   . Lung cancer   . Hypertension     ALLERGIES:  is allergic to codeine.  MEDICATIONS:  Current Outpatient Prescriptions  Medication Sig Dispense Refill  . ALPRAZolam (XANAX) 0.25 MG tablet Take 0.25 mg by mouth at bedtime as needed.      Marland Kitchen CLEOCIN-T 1 % lotion APPLY TO AFFECTED AREAS AS NEEDED AS DIRECTED  1 Bottle  3  . docusate sodium (COLACE) 100 MG capsule Take 100 mg by mouth 2 (two) times daily.        .  Gabapentin (NEURONTIN PO) Take 200 mg by mouth 3 (three) times daily.        Marland Kitchen lidocaine-prilocaine (EMLA) cream Apply topically as needed.        Marland Kitchen NEURONTIN 100 MG capsule TAKE (2) CAPSULES THREE TIMES DAILY.  180 each  2  . olmesartan (BENICAR) 20 MG tablet Take 20 mg by mouth daily.        Marland Kitchen oxyCODONE (OXYCONTIN) 20 MG 12 hr tablet Take 1 tablet (20 mg total) by mouth every 12 (twelve) hours.  60 tablet  0  . TARCEVA 100 MG tablet TAKE 1 TABLET DAILY 1 TO 2 HOURS BEFORE A MEAL  30 each  2  . temazepam (RESTORIL) 15 MG capsule Take 1 capsule (15 mg total) by mouth at bedtime as needed.  30 capsule  0  . DISCONTD: prochlorperazine (COMPAZINE) 10 MG tablet Take 10 mg by mouth every 6 (six) hours as needed.          REVIEW OF SYSTEMS:  A comprehensive review of systems was negative.   PHYSICAL EXAMINATION: General appearance: alert, cooperative and no distress Head: Normocephalic, without obvious abnormality, atraumatic Neck: no adenopathy Lymph nodes: Cervical, supraclavicular, and axillary nodes normal. Resp: clear to auscultation bilaterally Cardio: regular rate and rhythm, S1, S2 normal, no murmur, click, rub or gallop GI: soft, non-tender; bowel sounds normal; no masses,  no organomegaly Extremities: extremities normal, atraumatic, no cyanosis or edema Neurologic: Alert  and oriented X 3, normal strength and tone. Normal symmetric reflexes. Normal coordination and gait  ECOG PERFORMANCE STATUS: 0 - Asymptomatic  Blood pressure 150/62, pulse 76, temperature 96.9 F (36.1 C), temperature source Oral, height 5\' 6"  (1.676 m), weight 142 lb 12.8 oz (64.774 kg).  LABORATORY DATA: Lab Results  Component Value Date   WBC 4.4 10/06/2011   HGB 10.1* 10/06/2011   HCT 30.0* 10/06/2011   MCV 97.6 10/06/2011   PLT 192 10/06/2011      Chemistry      Component Value Date/Time   NA 139 09/02/2011 1406   NA 139 08/11/2011 1000   K 3.9 09/02/2011 1406   K 4.3 08/11/2011 1000   CL 97* 09/02/2011  1406   CL 105 08/11/2011 1000   CO2 29 09/02/2011 1406   CO2 25 08/11/2011 1000   BUN 18 09/02/2011 1406   BUN 25* 08/11/2011 1000   CREATININE 1.2 09/02/2011 1406   CREATININE 1.03 08/11/2011 1000      Component Value Date/Time   CALCIUM 8.0 09/02/2011 1406   CALCIUM 8.4 08/11/2011 1000   ALKPHOS 71 09/02/2011 1406   ALKPHOS 72 08/11/2011 1000   AST 25 09/02/2011 1406   AST 16 08/11/2011 1000   ALT 10 08/11/2011 1000   BILITOT 0.60 09/02/2011 1406   BILITOT 0.6 08/11/2011 1000       RADIOGRAPHIC STUDIES: Ct Chest W Contrast  09/02/2011  *RADIOLOGY REPORT*  Clinical Data:  Lung cancer.  Oral chemotherapy ongoing.  CT CHEST, ABDOMEN AND PELVIS WITH CONTRAST  Technique:  Multidetector CT imaging of the chest, abdomen and pelvis was performed following the standard protocol during bolus administration of intravenous contrast.  Contrast: OMNIPAQUE IOHEXOL 300 MG/ML IJ SOLN  Comparison:  CT 06/10/2011, PET CT 11/26/2010  CT CHEST  Findings:  In the right upper lobe, the lobular mass measures 37 mm in craniocaudad dimension compared to 35 mm on prior.  The inferior portion of this mass appears increased in size measuring 27 x 17 mm compared to 23 x 16 mm on prior.  The superior portion is similar measuring 29 x 15 mm (image 12) compared to 26 by 15 mm on prior.  There is no new pulmonary lesions are present.  No axillary or supraclavicular lymphadenopathy.  No mediastinal lymphadenopathy.  No pericardial fluid.  Esophagus is normal.  IMPRESSION:  1. Concern for an increase in size of the inferior portion of the lobular right upper lobe mass suggesting mild disease progression. 2.  No new lesions in the thorax. 3.  No evidence of mediastinal metastasis.  CT ABDOMEN AND PELVIS  Findings:  No focal hepatic lesion.  Mild biliary ductal dilatation is  similar to prior and likely relates to cholecystectomy.  The pancreas, spleen, adrenal glands, and kidneys are normal.  The stomach, small bowel, and colon are  normal.  There is a moderate volume stool right colon is similar to prior.  No evidence of obstructing lesion.  No retroperitoneal periportal lymphadenopathy.  No free fluid the pelvis.  Post hysterectomy anatomy.  No pelvic lymphadenopathy.  There is a sclerotic lesions within the sacrum and spine which are not changed from prior.  IMPRESSION:  1.  No evidence metastasis within the abdomen or pelvis soft tissues. 2.  Stable sclerotic metastasis in the spine and sternum.  Original Report Authenticated By: Genevive Bi, M.D.   Ct Abdomen Pelvis W Contrast  09/02/2011  *RADIOLOGY REPORT*  Clinical Data:  Lung cancer.  Oral  chemotherapy ongoing.  CT CHEST, ABDOMEN AND PELVIS WITH CONTRAST  Technique:  Multidetector CT imaging of the chest, abdomen and pelvis was performed following the standard protocol during bolus administration of intravenous contrast.  Contrast: OMNIPAQUE IOHEXOL 300 MG/ML IJ SOLN  Comparison:  CT 06/10/2011, PET CT 11/26/2010  CT CHEST  Findings:  In the right upper lobe, the lobular mass measures 37 mm in craniocaudad dimension compared to 35 mm on prior.  The inferior portion of this mass appears increased in size measuring 27 x 17 mm compared to 23 x 16 mm on prior.  The superior portion is similar measuring 29 x 15 mm (image 12) compared to 26 by 15 mm on prior.  There is no new pulmonary lesions are present.  No axillary or supraclavicular lymphadenopathy.  No mediastinal lymphadenopathy.  No pericardial fluid.  Esophagus is normal.  IMPRESSION:  1. Concern for an increase in size of the inferior portion of the lobular right upper lobe mass suggesting mild disease progression. 2.  No new lesions in the thorax. 3.  No evidence of mediastinal metastasis.  CT ABDOMEN AND PELVIS  Findings:  No focal hepatic lesion.  Mild biliary ductal dilatation is  similar to prior and likely relates to cholecystectomy.  The pancreas, spleen, adrenal glands, and kidneys are normal.  The stomach,  small bowel, and colon are normal.  There is a moderate volume stool right colon is similar to prior.  No evidence of obstructing lesion.  No retroperitoneal periportal lymphadenopathy.  No free fluid the pelvis.  Post hysterectomy anatomy.  No pelvic lymphadenopathy.  There is a sclerotic lesions within the sacrum and spine which are not changed from prior.  IMPRESSION:  1.  No evidence metastasis within the abdomen or pelvis soft tissues. 2.  Stable sclerotic metastasis in the spine and sternum.  Original Report Authenticated By: Genevive Bi, M.D.    ASSESSMENT/PLAN: This is a very pleasant 77 years old white female with metastatic non-small cell lung cancer currently on treatment with oral Tarceva 100 mg by mouth daily. The patient is tolerating her treatment fairly well she has no significant evidence for disease progression but mild increase in the right upper lobe mass. The patient was discussed with Dr. Arbutus Ped. She will continue on Tarceva at 100 mg by mouth daily. She will also continue her treatment with Zometa given every 3 months. We will arrange to have her Port-A-Cath flushed again when she returns in one month. She does not requiring prescription refills today. She'll return in one month with a repeat CBC differential and C. met as well as another symptom management visit  Jamie Munoz, Dalyah Pla E, PA-C   All questions were answered. The patient knows to call the clinic with any problems, questions or concerns. We can certainly see the patient much sooner if necessary.

## 2011-11-03 ENCOUNTER — Other Ambulatory Visit (HOSPITAL_BASED_OUTPATIENT_CLINIC_OR_DEPARTMENT_OTHER): Payer: Medicare Other | Admitting: Lab

## 2011-11-03 ENCOUNTER — Ambulatory Visit: Payer: Medicare Other

## 2011-11-03 ENCOUNTER — Encounter: Payer: Self-pay | Admitting: Physician Assistant

## 2011-11-03 ENCOUNTER — Ambulatory Visit (HOSPITAL_BASED_OUTPATIENT_CLINIC_OR_DEPARTMENT_OTHER): Payer: Medicare Other | Admitting: Physician Assistant

## 2011-11-03 ENCOUNTER — Telehealth: Payer: Self-pay | Admitting: Internal Medicine

## 2011-11-03 VITALS — BP 145/65 | HR 78 | Temp 98.5°F | Ht 66.0 in | Wt 136.1 lb

## 2011-11-03 DIAGNOSIS — R21 Rash and other nonspecific skin eruption: Secondary | ICD-10-CM

## 2011-11-03 DIAGNOSIS — C3490 Malignant neoplasm of unspecified part of unspecified bronchus or lung: Secondary | ICD-10-CM

## 2011-11-03 DIAGNOSIS — C3410 Malignant neoplasm of upper lobe, unspecified bronchus or lung: Secondary | ICD-10-CM

## 2011-11-03 DIAGNOSIS — C7951 Secondary malignant neoplasm of bone: Secondary | ICD-10-CM

## 2011-11-03 DIAGNOSIS — R911 Solitary pulmonary nodule: Secondary | ICD-10-CM

## 2011-11-03 LAB — COMPREHENSIVE METABOLIC PANEL
ALT: <8 U/L (ref 0–35)
BUN: 15 mg/dL (ref 6–23)
CO2: 25 mEq/L (ref 19–32)
Creatinine, Ser: 0.86 mg/dL (ref 0.50–1.10)
Glucose, Bld: 85 mg/dL (ref 70–99)
Total Bilirubin: 0.6 mg/dL (ref 0.3–1.2)

## 2011-11-03 LAB — CBC WITH DIFFERENTIAL/PLATELET
Eosinophils Absolute: 0.1 10*3/uL (ref 0.0–0.5)
LYMPH%: 13.7 % — ABNORMAL LOW (ref 14.0–49.7)
MCV: 95.1 fL (ref 79.5–101.0)
MONO%: 9.7 % (ref 0.0–14.0)
NEUT#: 4.7 10*3/uL (ref 1.5–6.5)
NEUT%: 75 % (ref 38.4–76.8)
Platelets: 248 10*3/uL (ref 145–400)
RBC: 3.29 10*6/uL — ABNORMAL LOW (ref 3.70–5.45)
nRBC: 0 % (ref 0–0)

## 2011-11-03 MED ORDER — HEPARIN SOD (PORK) LOCK FLUSH 100 UNIT/ML IV SOLN
500.0000 [IU] | Freq: Once | INTRAVENOUS | Status: AC
  Administered 2011-11-03: 500 [IU] via INTRAVENOUS
  Filled 2011-11-03: qty 5

## 2011-11-03 MED ORDER — OXYCODONE HCL 20 MG PO TB12: 20.0000 mg | ORAL_TABLET | Freq: Two times a day (BID) | ORAL | Status: DC

## 2011-11-03 MED ORDER — SODIUM CHLORIDE 0.9 % IJ SOLN
10.0000 mL | INTRAMUSCULAR | Status: DC | PRN
  Administered 2011-11-03: 10 mL via INTRAVENOUS
  Filled 2011-11-03: qty 10

## 2011-11-03 MED ORDER — CLINDAMYCIN PHOSPHATE 1 % EX LOTN: TOPICAL_LOTION | Freq: Two times a day (BID) | CUTANEOUS | Status: DC

## 2011-11-03 NOTE — Progress Notes (Signed)
[facility] [phone]   [phone]  OFFICE PROGRESS NOTE  [name], MD, MD [address] [place] [address]  DIAGNOSIS: Metastatic non-small cell lung cancer (T2 NX M1). Presented with right upper lobe lung mass as well as multiple pulmonary nodules and bone metastasis at L3 vertebral body. This was diagnosed in March of 2006.   PRIOR THERAPY:  1. Status post palliative radiotherapy to the L3 lesion under the care of Dr. Kathrynn Running completed September 17, 2004. 2. Status post 6 cycles of systemic chemotherapy with carboplatin and paclitaxel. Last dose was given January 01, 2005. 3. Status post palliative radiotherapy to the right upper lobe lung mass under the care of Dr. Kathrynn Running completed on 12/29/2010. 4. Status post treatment with Tarceva 150 mg p.o. daily started April 2007 through July 2012. CUCURRENT THERAPY:  1. Tarceva 100 mg p.o. daily started July 2012. 2. Zometa 4 mg IV q. 3 months for bone metastasis  INTERVAL HISTORY: Jamie Munoz 78 y.o. female returns to the clinic today for followup visit. Today she is accompanied by family member. She reports that she fell over a step into her house and broke her right lower extremity. She was seen at [facility] and she is currently in a cast for approximately 6 weeks. She fell approximately one week after her last office visit here. She also reports increased rash affecting her scalp to a greater degree than usual as well as now affecting her chest back and arms. She denied any diarrhea and in fact is having some difficulty with constipation. She usually takes 2 Colace tablets daily. She'll sometimes take this twice a day if she is having more constipation than usual with good results. She is otherwise tolerating her treatment with Tarceva fairly well. The patient denied having any significant chest pain or shortness of breath, no cough or hemoptysis. No weight loss or night sweats. She is due for  Port-A-Cath flush today.  MEDICAL HISTORY: Past Medical History  Diagnosis Date  . Cancer   . Lung cancer   . Hypertension     ALLERGIES:  is allergic to codeine.  MEDICATIONS:  Current Outpatient Prescriptions  Medication Sig Dispense Refill  . ALPRAZolam (XANAX) 0.25 MG tablet Take 0.25 mg by mouth at bedtime as needed.      . clindamycin (CLEOCIN-T) 1 % lotion Apply topically 2 (two) times daily.  60 mL  3  . docusate sodium (COLACE) 100 MG capsule Take 100 mg by mouth 2 (two) times daily.        . Gabapentin (NEURONTIN PO) Take 200 mg by mouth 3 (three) times daily.        Marland Kitchen lidocaine-prilocaine (EMLA) cream Apply topically as needed.        Marland Kitchen NEURONTIN 100 MG capsule TAKE (2) CAPSULES THREE TIMES DAILY.  180 each  2  . olmesartan (BENICAR) 20 MG tablet Take 20 mg by mouth daily.        Marland Kitchen oxyCODONE (OXYCONTIN) 20 MG 12 hr tablet Take 1 tablet (20 mg total) by mouth every 12 (twelve) hours.  60 tablet  0  . TARCEVA 100 MG tablet TAKE 1 TABLET DAILY 1 TO 2 HOURS BEFORE A MEAL  30 each  2  . temazepam (RESTORIL) 15 MG capsule Take 1 capsule (15 mg total) by mouth at bedtime as needed.  30 capsule  0  . DISCONTD: prochlorperazine (COMPAZINE) 10 MG tablet Take 10 mg by mouth every 6 (  six) hours as needed.          REVIEW OF SYSTEMS:  A comprehensive review of systems was negative except for: Gastrointestinal: positive for constipation Integument/breast: positive for rash Musculoskeletal: positive for Recent fracture, right lower extremity. Currently in a below the knee cast.   PHYSICAL EXAMINATION: General appearance: alert, cooperative and no distress Head: Normocephalic, without obvious abnormality, atraumatic Neck: no adenopathy Lymph nodes: Cervical, supraclavicular, and axillary nodes normal. Resp: clear to auscultation bilaterally Cardio: regular rate and rhythm, S1, S2 normal, no murmur, click, rub or gallop GI: soft, non-tender; bowel sounds normal; no masses,  no  organomegaly Extremities: Right mANA below the knee cast. Left lower extremity no point tenderness pitting edema or palpable cords Neurologic: Alert and oriented X 3, normal strength and tone. Normal symmetric reflexes. Normal coordination and gait Skin: Scattered erythematous acneform lesions on the anterior chest upper back and upper extremities as well as scalp. No evidence of superinfection.  ECOG PERFORMANCE STATUS: 0 - Asymptomatic  Blood pressure 145/65, pulse 78, temperature 98.5 F (36.9 C), temperature source Oral, height 5\' 6"  (1.676 m), weight 136 lb 1.6 oz (61.735 kg).  LABORATORY DATA: Lab Results  Component Value Date   WBC 6.3 11/03/2011   HGB 10.3* 11/03/2011   HCT 31.3* 11/03/2011   MCV 95.1 11/03/2011   PLT 248 11/03/2011      Chemistry      Component Value Date/Time   NA 144 10/06/2011 0905   NA 139 09/02/2011 1406   K 3.7 10/06/2011 0905   K 3.9 09/02/2011 1406   CL 108 10/06/2011 0905   CL 97* 09/02/2011 1406   CO2 29 10/06/2011 0905   CO2 29 09/02/2011 1406   BUN 19 10/06/2011 0905   BUN 18 09/02/2011 1406   CREATININE 0.92 10/06/2011 0905   CREATININE 1.2 09/02/2011 1406      Component Value Date/Time   CALCIUM 7.9* 10/06/2011 0905   CALCIUM 8.0 09/02/2011 1406   ALKPHOS 64 10/06/2011 0905   ALKPHOS 71 09/02/2011 1406   AST 19 10/06/2011 0905   AST 25 09/02/2011 1406   ALT 12 10/06/2011 0905   BILITOT 0.7 10/06/2011 0905   BILITOT 0.60 09/02/2011 1406       RADIOGRAPHIC STUDIES: Ct Chest W Contrast  09/02/2011  *RADIOLOGY REPORT*  Clinical Data:  Lung cancer.  Oral chemotherapy ongoing.  CT CHEST, ABDOMEN AND PELVIS WITH CONTRAST  Technique:  Multidetector CT imaging of the chest, abdomen and pelvis was performed following the standard protocol during bolus administration of intravenous contrast.  Contrast: OMNIPAQUE IOHEXOL 300 MG/ML IJ SOLN  Comparison:  CT 06/10/2011, PET CT 11/26/2010  CT CHEST  Findings:  In the right upper lobe, the lobular mass measures  37 mm in craniocaudad dimension compared to 35 mm on prior.  The inferior portion of this mass appears increased in size measuring 27 x 17 mm compared to 23 x 16 mm on prior.  The superior portion is similar measuring 29 x 15 mm (image 12) compared to 26 by 15 mm on prior.  There is no new pulmonary lesions are present.  No axillary or supraclavicular lymphadenopathy.  No mediastinal lymphadenopathy.  No pericardial fluid.  Esophagus is normal.  IMPRESSION:  1. Concern for an increase in size of the inferior portion of the lobular right upper lobe mass suggesting mild disease progression. 2.  No new lesions in the thorax. 3.  No evidence of mediastinal metastasis.  CT ABDOMEN AND PELVIS  Findings:  No focal hepatic lesion.  Mild biliary ductal dilatation is  similar to prior and likely relates to cholecystectomy.  The pancreas, spleen, adrenal glands, and kidneys are normal.  The stomach, small bowel, and colon are normal.  There is a moderate volume stool right colon is similar to prior.  No evidence of obstructing lesion.  No retroperitoneal periportal lymphadenopathy.  No free fluid the pelvis.  Post hysterectomy anatomy.  No pelvic lymphadenopathy.  There is a sclerotic lesions within the sacrum and spine which are not changed from prior.  IMPRESSION:  1.  No evidence metastasis within the abdomen or pelvis soft tissues. 2.  Stable sclerotic metastasis in the spine and sternum.  Original Report Authenticated By: Genevive Bi, M.D.   Ct Abdomen Pelvis W Contrast  09/02/2011  *RADIOLOGY REPORT*  Clinical Data:  Lung cancer.  Oral chemotherapy ongoing.  CT CHEST, ABDOMEN AND PELVIS WITH CONTRAST  Technique:  Multidetector CT imaging of the chest, abdomen and pelvis was performed following the standard protocol during bolus administration of intravenous contrast.  Contrast: OMNIPAQUE IOHEXOL 300 MG/ML IJ SOLN  Comparison:  CT 06/10/2011, PET CT 11/26/2010  CT CHEST  Findings:  In the right upper lobe,  the lobular mass measures 37 mm in craniocaudad dimension compared to 35 mm on prior.  The inferior portion of this mass appears increased in size measuring 27 x 17 mm compared to 23 x 16 mm on prior.  The superior portion is similar measuring 29 x 15 mm (image 12) compared to 26 by 15 mm on prior.  There is no new pulmonary lesions are present.  No axillary or supraclavicular lymphadenopathy.  No mediastinal lymphadenopathy.  No pericardial fluid.  Esophagus is normal.  IMPRESSION:  1. Concern for an increase in size of the inferior portion of the lobular right upper lobe mass suggesting mild disease progression. 2.  No new lesions in the thorax. 3.  No evidence of mediastinal metastasis.  CT ABDOMEN AND PELVIS  Findings:  No focal hepatic lesion.  Mild biliary ductal dilatation is  similar to prior and likely relates to cholecystectomy.  The pancreas, spleen, adrenal glands, and kidneys are normal.  The stomach, small bowel, and colon are normal.  There is a moderate volume stool right colon is similar to prior.  No evidence of obstructing lesion.  No retroperitoneal periportal lymphadenopathy.  No free fluid the pelvis.  Post hysterectomy anatomy.  No pelvic lymphadenopathy.  There is a sclerotic lesions within the sacrum and spine which are not changed from prior.  IMPRESSION:  1.  No evidence metastasis within the abdomen or pelvis soft tissues. 2.  Stable sclerotic metastasis in the spine and sternum.  Original Report Authenticated By: Genevive Bi, M.D.    ASSESSMENT/PLAN: This is a very pleasant 77 years old white female with metastatic non-small cell lung cancer currently on treatment with oral Tarceva 100 mg by mouth daily. The patient is tolerating her treatment fairly well she has no significant evidence for disease progression but mild increase in the right upper lobe mass on the last restaging CT scan. The patient was discussed with Dr. Arbutus Ped. She will continue on Tarceva at 100 mg by mouth  daily. She will also continue her treatment with Zometa given every 3 months however due to transportation issues this will be scheduled for her to receive and she returns next month.. She'll have a Port-A-Cath flush today. Since you will be used again next month for her Zometa  she will not be due for another flush until 8 weeks after that time. She is to continue on her clindamycin lotion for the Tarceva-related acneform rash. Refill for this medication with 3 subsequent refills was sent via [facility]. scribed to her pharmacy of record. She was also given her a prescription for her OxyContin 20 mg tablets one tablet by mouth every 12 hours for pain a total of 60 tablets with no refill. She'll return in one month with repeat CBC differential C. met and CT of the chest abdomen and pelvis with contrast to reevaluate her disease. She'll be seen by Dr. Arbutus Ped at that visit we will review the results of the restaging CT scans. As previously stated she will also receive Zometa infusion at that visit.   Jamie Munoz, Jamie Haffey E, PA-C   All questions were answered. The patient knows to call the clinic with any problems, questions or concerns. We can certainly see the patient much sooner if necessary.

## 2011-11-03 NOTE — Patient Instructions (Signed)
Call MD for problems 

## 2011-11-03 NOTE — Telephone Encounter (Signed)
appts made and printed for pt aom °

## 2011-11-04 ENCOUNTER — Other Ambulatory Visit: Payer: Self-pay | Admitting: Internal Medicine

## 2011-11-25 ENCOUNTER — Ambulatory Visit (HOSPITAL_COMMUNITY)
Admission: RE | Admit: 2011-11-25 | Discharge: 2011-11-25 | Disposition: A | Discharge status: Home / Self Care | Payer: Medicare Other | Source: Ambulatory Visit | Attending: Physician Assistant | Admitting: Physician Assistant

## 2011-11-25 DIAGNOSIS — C3490 Malignant neoplasm of unspecified part of unspecified bronchus or lung: Secondary | ICD-10-CM | POA: Insufficient documentation

## 2011-11-25 DIAGNOSIS — Z9089 Acquired absence of other organs: Secondary | ICD-10-CM | POA: Insufficient documentation

## 2011-11-25 DIAGNOSIS — Z79899 Other long term (current) drug therapy: Secondary | ICD-10-CM | POA: Insufficient documentation

## 2011-11-25 DIAGNOSIS — Z90710 Acquired absence of both cervix and uterus: Secondary | ICD-10-CM | POA: Insufficient documentation

## 2011-11-25 DIAGNOSIS — R222 Localized swelling, mass and lump, trunk: Secondary | ICD-10-CM | POA: Insufficient documentation

## 2011-11-25 DIAGNOSIS — C7951 Secondary malignant neoplasm of bone: Secondary | ICD-10-CM | POA: Insufficient documentation

## 2011-11-25 DIAGNOSIS — K5730 Diverticulosis of large intestine without perforation or abscess without bleeding: Secondary | ICD-10-CM | POA: Insufficient documentation

## 2011-11-25 MED ORDER — IOHEXOL 300 MG/ML  SOLN
100.0000 mL | Freq: Once | INTRAMUSCULAR | Status: AC | PRN
  Administered 2011-11-25: 100 mL via INTRAVENOUS

## 2011-11-30 ENCOUNTER — Ambulatory Visit (HOSPITAL_BASED_OUTPATIENT_CLINIC_OR_DEPARTMENT_OTHER): Payer: Medicare Other

## 2011-11-30 ENCOUNTER — Other Ambulatory Visit (HOSPITAL_BASED_OUTPATIENT_CLINIC_OR_DEPARTMENT_OTHER): Payer: Medicare Other

## 2011-11-30 ENCOUNTER — Telehealth: Payer: Self-pay | Admitting: Internal Medicine

## 2011-11-30 ENCOUNTER — Ambulatory Visit (HOSPITAL_BASED_OUTPATIENT_CLINIC_OR_DEPARTMENT_OTHER): Payer: Medicare Other | Admitting: Internal Medicine

## 2011-11-30 VITALS — BP 147/66 | HR 73 | Temp 97.3°F | Ht 66.0 in | Wt 132.5 lb

## 2011-11-30 DIAGNOSIS — C3490 Malignant neoplasm of unspecified part of unspecified bronchus or lung: Secondary | ICD-10-CM

## 2011-11-30 DIAGNOSIS — C3410 Malignant neoplasm of upper lobe, unspecified bronchus or lung: Secondary | ICD-10-CM

## 2011-11-30 DIAGNOSIS — C7952 Secondary malignant neoplasm of bone marrow: Secondary | ICD-10-CM

## 2011-11-30 DIAGNOSIS — C7951 Secondary malignant neoplasm of bone: Secondary | ICD-10-CM

## 2011-11-30 LAB — CBC WITH DIFFERENTIAL/PLATELET
Basophils Absolute: 0 10*3/uL (ref 0.0–0.1)
Eosinophils Absolute: 0.1 10*3/uL (ref 0.0–0.5)
HCT: 29.2 % — ABNORMAL LOW (ref 34.8–46.6)
HGB: 9.7 g/dL — ABNORMAL LOW (ref 11.6–15.9)
LYMPH%: 16.7 % (ref 14.0–49.7)
MONO#: 0.5 10*3/uL (ref 0.1–0.9)
NEUT#: 3.7 10*3/uL (ref 1.5–6.5)
NEUT%: 71 % (ref 38.4–76.8)
Platelets: 198 10*3/uL (ref 145–400)
WBC: 5.2 10*3/uL (ref 3.9–10.3)

## 2011-11-30 LAB — COMPREHENSIVE METABOLIC PANEL
CO2: 25 mEq/L (ref 19–32)
Creatinine, Ser: 0.87 mg/dL (ref 0.50–1.10)
Glucose, Bld: 83 mg/dL (ref 70–99)
Sodium: 138 mEq/L (ref 135–145)
Total Bilirubin: 0.7 mg/dL (ref 0.3–1.2)
Total Protein: 6.6 g/dL (ref 6.0–8.3)

## 2011-11-30 MED ORDER — ZOLEDRONIC ACID 4 MG/100ML IV SOLN
4.0000 mg | Freq: Once | INTRAVENOUS | Status: DC
  Filled 2011-11-30: qty 100

## 2011-11-30 MED ORDER — HEPARIN SOD (PORK) LOCK FLUSH 100 UNIT/ML IV SOLN
500.0000 [IU] | Freq: Once | INTRAVENOUS | Status: AC | PRN
  Administered 2011-11-30: 500 [IU]
  Filled 2011-11-30: qty 5

## 2011-11-30 MED ORDER — SODIUM CHLORIDE 0.9 % IJ SOLN
10.0000 mL | INTRAMUSCULAR | Status: DC | PRN
  Administered 2011-11-30: 10 mL
  Filled 2011-11-30: qty 10

## 2011-11-30 MED ORDER — SODIUM CHLORIDE 0.9 % IV SOLN
Freq: Once | INTRAVENOUS | Status: AC
  Administered 2011-11-30: 12:00:00 via INTRAVENOUS

## 2011-11-30 MED ORDER — ZOLEDRONIC ACID 4 MG/5ML IV CONC
4.0000 mg | Freq: Once | INTRAVENOUS | Status: DC
  Administered 2011-11-30: 4 mg via INTRAVENOUS

## 2011-11-30 NOTE — Progress Notes (Signed)
[facility] [phone]   Fax:(336) [phone]  OFFICE PROGRESS NOTE  [name], MD [address] [place] [address]  DIAGNOSIS: Metastatic non-small cell lung cancer (T2 NX M1). Presented with right upper lobe lung mass as well as multiple pulmonary nodules and bone metastasis at L3 vertebral body. This was diagnosed in March of 2006.   PRIOR THERAPY:  1. Status post palliative radiotherapy to the L3 lesion under the care of Dr. Kathrynn Running completed September 17, 2004. 2. Status post 6 cycles of systemic chemotherapy with carboplatin and paclitaxel. Last dose was given January 01, 2005. 3. Status post palliative radiotherapy to the right upper lobe lung mass under the care of Dr. Kathrynn Running completed on 12/29/2010. 4. Status post treatment with Tarceva 150 mg p.o. daily started April 2007 through July 2012. CURRENT THERAPY:  1. Tarceva 100 mg p.o. daily started July 2012. 2. Zometa 4 mg IV q. 3 months for bone metastasis  INTERVAL HISTORY: Jamie Munoz 78 y.o. female returns to the clinic today for followup visit. The patient is doing fine with no specific complaints. She did performed recently and broke her right fibula. She is feeling fine otherwise. She is tolerating her treatment with Tarceva fairly well was no significant adverse effects. She denied having any significant chest pain or shortness of breath, no cough or hemoptysis. The patient denied having any significant weight loss or night sweats, no nausea or vomiting. She has repeat CT scan of the chest, abdomen and pelvis performed recently and she is here today for evaluation and discussion of her scan results.  MEDICAL HISTORY: Past Medical History  Diagnosis Date  . Cancer   . Lung cancer   . Hypertension     ALLERGIES:  is allergic to codeine.  MEDICATIONS:  Current Outpatient Prescriptions  Medication Sig Dispense Refill  . ALPRAZolam (XANAX) 0.25 MG tablet Take 0.25 mg by mouth at  bedtime as needed.      . clindamycin (CLEOCIN-T) 1 % lotion Apply topically 2 (two) times daily.  60 mL  3  . docusate sodium (COLACE) 100 MG capsule Take 100 mg by mouth 2 (two) times daily.        Marland Kitchen lidocaine-prilocaine (EMLA) cream Apply topically as needed.        Marland Kitchen NEURONTIN 100 MG capsule TAKE (2) CAPSULES THREE TIMES DAILY.  180 each  2  . olmesartan (BENICAR) 20 MG tablet Take 20 mg by mouth daily.        Marland Kitchen oxyCODONE (OXYCONTIN) 20 MG 12 hr tablet Take 1 tablet (20 mg total) by mouth every 12 (twelve) hours.  60 tablet  0  . TARCEVA 100 MG tablet TAKE 1 TABLET DAILY 1 TO 2 HOURS BEFORE A MEAL  30 each  2  . temazepam (RESTORIL) 15 MG capsule Take 1 capsule (15 mg total) by mouth at bedtime as needed.  30 capsule  0  . DISCONTD: prochlorperazine (COMPAZINE) 10 MG tablet Take 10 mg by mouth every 6 (six) hours as needed.          REVIEW OF SYSTEMS:  A comprehensive review of systems was negative.   PHYSICAL EXAMINATION: General appearance: alert, cooperative and no distress Head: Normocephalic, without obvious abnormality, atraumatic Neck: no adenopathy Lymph nodes: Cervical, supraclavicular, and axillary nodes normal. Resp: clear to auscultation bilaterally Cardio: regular rate and rhythm, S1, S2 normal, no murmur, click, rub or gallop GI: soft, non-tender; bowel sounds normal; no masses,  no organomegaly Extremities: extremities normal, atraumatic, no cyanosis or edema Neurologic: Alert and oriented X 3, normal strength and tone. Normal symmetric reflexes. Normal coordination and gait  ECOG PERFORMANCE STATUS: 1 - Symptomatic but completely ambulatory  Blood pressure 147/66, pulse 73, temperature 97.3 F (36.3 C), temperature source Oral, height 5\' 6"  (1.676 m), weight 132 lb 8 oz (60.102 kg).  LABORATORY DATA: Lab Results  Component Value Date   WBC 5.2 11/30/2011   HGB 9.7* 11/30/2011   HCT 29.2* 11/30/2011   MCV 94.5 11/30/2011   PLT 198 11/30/2011      Chemistry        Component Value Date/Time   NA 136 11/03/2011 0939   NA 139 09/02/2011 1406   K 4.0 11/03/2011 0939   K 3.9 09/02/2011 1406   CL 104 11/03/2011 0939   CL 97* 09/02/2011 1406   CO2 25 11/03/2011 0939   CO2 29 09/02/2011 1406   BUN 15 11/03/2011 0939   BUN 18 09/02/2011 1406   CREATININE 0.86 11/03/2011 0939   CREATININE 1.2 09/02/2011 1406      Component Value Date/Time   CALCIUM 8.0* 11/03/2011 0939   CALCIUM 8.0 09/02/2011 1406   ALKPHOS 76 11/03/2011 0939   ALKPHOS 71 09/02/2011 1406   AST 16 11/03/2011 0939   AST 25 09/02/2011 1406   ALT <8 11/03/2011 0939   BILITOT 0.6 11/03/2011 0939   BILITOT 0.60 09/02/2011 1406       RADIOGRAPHIC STUDIES: Ct Chest W Contrast  11/25/2011  *RADIOLOGY REPORT*  Clinical Data:  Follow-up lung carcinoma.  Currently undergoing chemotherapy.  Restaging.   CT CHEST, ABDOMEN AND PELVIS WITH CONTRAST  Technique:  Multidetector CT imaging of the chest, abdomen and pelvis was performed following the standard protocol during bolus administration of intravenous contrast.  Contrast: OMNIPAQUE IOHEXOL 300 MG/ML  SOLN  Comparison:  09/02/2011  CT CHEST  Findings:  Posterior right upper lobe mass shows interval increase in size since previous study, with the inferior portion now measuring 2.6 x 3.1 cm compared to 1.7 x 2.7 cm previously.  No other suspicious pulmonary nodules or masses are identified.  No evidence of acute infiltrate.  No evidence of pleural or pericardial effusion.  No evidence of hilar or mediastinal lymphadenopathy.  No adenopathy seen elsewhere within the thorax. Small mixed lytic and sclerotic lesion in the sternal manubrium remains stable.  IMPRESSION:  1.  Mild increase in size of inferior portion of lobular right upper lobe mass. 2.  Stable small sternal bone metastasis. 3.  No new sites of metastatic disease identified.   CT ABDOMEN AND PELVIS  Findings:  Prior cholecystectomy again noted and mild biliary duct dilatation is stable.  No liver masses  are identified.  The pancreas, spleen, adrenal glands, and kidneys are normal appearance.  No soft tissue masses or lymphadenopathy identified within the abdomen or pelvis.  Prior hysterectomy again demonstrated.  Diverticulosis is again seen involving the sigmoid colon, however there is no evidence of diverticulitis.  No other inflammatory process or abnormal fluid collections are identified. L1 lucent lesion and old fracture deformity of the L3 vertebral body remains stable.  IMPRESSION:  1.  Stable appearance of bone metastases. 2.  No soft tissue metastatic disease identified within the abdomen or pelvis.  Original Report Authenticated By: Danae Orleans, M.D.   ASSESSMENT: This is a very pleasant 78 years old white female with metastatic non-small cell lung cancer, adenocarcinoma. The patient is currently on treatment  with Tarceva for almost more than 5 years now. She is tolerating it fine with no significant evidence for disease progression except for the mildly enlarged inferior portion of the lobular right upper lobe mass.  PLAN: I discussed the scan results with the patient today. I recommended for her to continue on the current treatment with Tarceva. I will continue to monitor the right upper lobe lung mass closely on the upcoming scan. If you chose any further evidence for disease progression, I may refer the patient to Dr. Kathrynn Running for consideration of stereotactic radiotherapy to this area or empties her dose of Tarceva at 150 mg by mouth daily. The patient agreed to the current plan. She would also continue on treatment with Zometa every 3 months as scheduled. She would come back for followup visit in one month. She was advised to call me immediately if she has any concerning symptoms in the interval. All questions were answered. The patient knows to call the clinic with any problems, questions or concerns. We can certainly see the patient much sooner if necessary.  I spent 15 minutes  counseling the patient face to face. The total time spent in the appointment was 25 minutes.

## 2011-11-30 NOTE — Patient Instructions (Signed)
Patient aware of next appointment; discharged home with neighbor and no complaints.

## 2011-11-30 NOTE — Telephone Encounter (Signed)
gv pt appt schedule for July thru December. Pt is to have port flush q6-8wks but port is also being accessed w/tx q98mo. Port was last accessed today so flush appts were moved accordingly also taking into account q74mo tx appts.

## 2011-12-05 ENCOUNTER — Other Ambulatory Visit: Payer: Self-pay | Admitting: Internal Medicine

## 2011-12-19 ENCOUNTER — Other Ambulatory Visit: Payer: Self-pay | Admitting: Physician Assistant

## 2011-12-19 DIAGNOSIS — C3490 Malignant neoplasm of unspecified part of unspecified bronchus or lung: Secondary | ICD-10-CM

## 2011-12-20 ENCOUNTER — Encounter: Payer: Self-pay | Admitting: Internal Medicine

## 2011-12-20 NOTE — Progress Notes (Addendum)
Call from [facility] stating patient had called to see how to get her Tarceva.  I received a [employer] form which Dr. Arbutus Ped signed and I faxed back.  They called and needed additional information.  Resent fax with additional information.    12/21/11 Call from [facility] and the soonest they can get it to her by Friday.  Andy at the [facility] said they could dispense it and had it in stock.  I spoke to Fancy and her nephew will pick it up this afternoon.  Judeth Cornfield, RN will call it in.

## 2011-12-21 ENCOUNTER — Other Ambulatory Visit: Payer: Self-pay | Admitting: *Deleted

## 2011-12-21 DIAGNOSIS — C3490 Malignant neoplasm of unspecified part of unspecified bronchus or lung: Secondary | ICD-10-CM

## 2011-12-21 MED ORDER — ERLOTINIB HCL 100 MG PO TABS: 100.0000 mg | ORAL_TABLET | Freq: Every day | ORAL | Status: DC

## 2011-12-28 ENCOUNTER — Encounter: Payer: Self-pay | Admitting: Physician Assistant

## 2011-12-28 ENCOUNTER — Other Ambulatory Visit (HOSPITAL_BASED_OUTPATIENT_CLINIC_OR_DEPARTMENT_OTHER): Payer: Medicare Other | Admitting: Lab

## 2011-12-28 ENCOUNTER — Ambulatory Visit (HOSPITAL_BASED_OUTPATIENT_CLINIC_OR_DEPARTMENT_OTHER): Payer: Medicare Other | Admitting: Physician Assistant

## 2011-12-28 ENCOUNTER — Telehealth: Payer: Self-pay | Admitting: Internal Medicine

## 2011-12-28 VITALS — BP 125/59 | HR 69 | Temp 97.5°F | Ht 66.0 in | Wt 136.0 lb

## 2011-12-28 DIAGNOSIS — C7951 Secondary malignant neoplasm of bone: Secondary | ICD-10-CM

## 2011-12-28 DIAGNOSIS — C3410 Malignant neoplasm of upper lobe, unspecified bronchus or lung: Secondary | ICD-10-CM

## 2011-12-28 DIAGNOSIS — C3490 Malignant neoplasm of unspecified part of unspecified bronchus or lung: Secondary | ICD-10-CM

## 2011-12-28 LAB — COMPREHENSIVE METABOLIC PANEL
AST: 17 U/L (ref 0–37)
Alkaline Phosphatase: 65 U/L (ref 39–117)
Glucose, Bld: 101 mg/dL — ABNORMAL HIGH (ref 70–99)
Potassium: 3.9 mEq/L (ref 3.5–5.3)
Sodium: 140 mEq/L (ref 135–145)
Total Bilirubin: 0.5 mg/dL (ref 0.3–1.2)
Total Protein: 6.1 g/dL (ref 6.0–8.3)

## 2011-12-28 LAB — CBC WITH DIFFERENTIAL/PLATELET
BASO%: 0.3 % (ref 0.0–2.0)
EOS%: 1.7 % (ref 0.0–7.0)
Eosinophils Absolute: 0.1 10*3/uL (ref 0.0–0.5)
LYMPH%: 13.6 % — ABNORMAL LOW (ref 14.0–49.7)
MCH: 32.2 pg (ref 25.1–34.0)
MCHC: 33.2 g/dL (ref 31.5–36.0)
MCV: 97 fL (ref 79.5–101.0)
MONO%: 7.9 % (ref 0.0–14.0)
Platelets: 204 10*3/uL (ref 145–400)
RBC: 2.89 10*6/uL — ABNORMAL LOW (ref 3.70–5.45)
RDW: 14.8 % — ABNORMAL HIGH (ref 11.2–14.5)

## 2011-12-28 MED ORDER — OXYCODONE HCL 20 MG PO TB12: 20.0000 mg | ORAL_TABLET | Freq: Two times a day (BID) | ORAL | Status: DC

## 2011-12-28 NOTE — Telephone Encounter (Signed)
Gave pt appt calendar for August 2013 lab, ML and flush and calerndar for September 2013

## 2012-01-01 NOTE — Progress Notes (Signed)
[facility] Telephone:([phone]) [phone]   [phone]  OFFICE PROGRESS NOTE  [name], MD [address] [place] [address]  DIAGNOSIS: Metastatic non-small cell lung cancer (T2 NX M1). Presented with right upper lobe lung mass as well as multiple pulmonary nodules and bone metastasis at L3 vertebral body. This was diagnosed in March of 2006.   PRIOR THERAPY:  1. Status post palliative radiotherapy to the L3 lesion under the care of Dr. Kathrynn Running completed September 17, 2004. 2. Status post 6 cycles of systemic chemotherapy with carboplatin and paclitaxel. Last dose was given January 01, 2005. 3. Status post palliative radiotherapy to the right upper lobe lung mass under the care of Dr. Kathrynn Running completed on 12/29/2010. 4. Status post treatment with Tarceva 150 mg p.o. daily started April 2007 through July 2012. CURRENT THERAPY:  1. Tarceva 100 mg p.o. daily started July 2012. 2. Zometa 4 mg IV q. 3 months for bone metastasis  INTERVAL HISTORY: Jamie Munoz 78 y.o. female returns to the clinic today for followup visit. The patient is doing fine with no specific complaints. She is now on of her walking cast and ambulating well status post right fibula fracture.  She is feeling fine otherwise. She is tolerating her treatment with Tarceva fairly well was no significant adverse effects. She's not had any issues with diarrhea. She has a rash primarily on her scalp. She requests a refill for her OxyContin 20 mg tablets. She denied having any significant chest pain or shortness of breath, no cough or hemoptysis.The patient denied having any significant weight loss or night sweats, no nausea or vomiting.   MEDICAL HISTORY: Past Medical History  Diagnosis Date  . Cancer   . Lung cancer   . Hypertension     ALLERGIES:  is allergic to codeine.  MEDICATIONS:  Current Outpatient Prescriptions  Medication Sig Dispense Refill  . ALPRAZolam (XANAX) 0.25 MG tablet Take  0.25 mg by mouth at bedtime as needed.      . clindamycin (CLEOCIN-T) 1 % lotion Apply topically 2 (two) times daily.  60 mL  3  . docusate sodium (COLACE) 100 MG capsule Take 100 mg by mouth 2 (two) times daily.        Marland Kitchen erlotinib (TARCEVA) 100 MG tablet Take 1 tablet (100 mg total) by mouth daily. Take on an empty stomach 1 hour before meals or 2 hours after meals  30 tablet  2  . lidocaine-prilocaine (EMLA) cream Apply topically as needed.        Marland Kitchen NEURONTIN 100 MG capsule TAKE (2) CAPSULES THREE TIMES DAILY.  180 each  2  . olmesartan (BENICAR) 20 MG tablet Take 20 mg by mouth daily.        Marland Kitchen oxyCODONE (OXYCONTIN) 20 MG 12 hr tablet Take 1 tablet (20 mg total) by mouth every 12 (twelve) hours.  60 tablet  0  . TANDEM 162-115.2 MG CAPS TAKE (1) CAPSULE AS DIR- ECTED.  30 each  2  . temazepam (RESTORIL) 15 MG capsule Take 1 capsule (15 mg total) by mouth at bedtime as needed.  30 capsule  0  . DISCONTD: prochlorperazine (COMPAZINE) 10 MG tablet Take 10 mg by mouth every 6 (six) hours as needed.          REVIEW OF SYSTEMS:  A comprehensive review of systems was negative.   PHYSICAL EXAMINATION: General appearance: alert, cooperative and no distress Head: Normocephalic, without obvious abnormality, atraumatic Neck:  no adenopathy Lymph nodes: Cervical, supraclavicular, and axillary nodes normal. Resp: clear to auscultation bilaterally Cardio: regular rate and rhythm, S1, S2 normal, no murmur, click, rub or gallop GI: soft, non-tender; bowel sounds normal; no masses,  no organomegaly Extremities: extremities normal, atraumatic, no cyanosis or edema Neurologic: Alert and oriented X 3, normal strength and tone. Normal symmetric reflexes. Normal coordination and gait  ECOG PERFORMANCE STATUS: 1 - Symptomatic but completely ambulatory  Blood pressure 125/59, pulse 69, temperature 97.5 F (36.4 C), temperature source Oral, height 5\' 6"  (1.676 m), weight 136 lb (61.689 kg).  LABORATORY  DATA: Lab Results  Component Value Date   WBC 5.2 12/28/2011   HGB 9.3* 12/28/2011   HCT 28.1* 12/28/2011   MCV 97.0 12/28/2011   PLT 204 12/28/2011      Chemistry      Component Value Date/Time   NA 140 12/28/2011 1020   NA 139 09/02/2011 1406   K 3.9 12/28/2011 1020   K 3.9 09/02/2011 1406   CL 106 12/28/2011 1020   CL 97* 09/02/2011 1406   CO2 28 12/28/2011 1020   CO2 29 09/02/2011 1406   BUN 18 12/28/2011 1020   BUN 18 09/02/2011 1406   CREATININE 1.01 12/28/2011 1020   CREATININE 1.2 09/02/2011 1406      Component Value Date/Time   CALCIUM 8.1* 12/28/2011 1020   CALCIUM 8.0 09/02/2011 1406   ALKPHOS 65 12/28/2011 1020   ALKPHOS 71 09/02/2011 1406   AST 17 12/28/2011 1020   AST 25 09/02/2011 1406   ALT 10 12/28/2011 1020   BILITOT 0.5 12/28/2011 1020   BILITOT 0.60 09/02/2011 1406       RADIOGRAPHIC STUDIES: Ct Chest W Contrast  11/25/2011  *RADIOLOGY REPORT*  Clinical Data:  Follow-up lung carcinoma.  Currently undergoing chemotherapy.  Restaging.   CT CHEST, ABDOMEN AND PELVIS WITH CONTRAST  Technique:  Multidetector CT imaging of the chest, abdomen and pelvis was performed following the standard protocol during bolus administration of intravenous contrast.  Contrast: OMNIPAQUE IOHEXOL 300 MG/ML  SOLN  Comparison:  09/02/2011  CT CHEST  Findings:  Posterior right upper lobe mass shows interval increase in size since previous study, with the inferior portion now measuring 2.6 x 3.1 cm compared to 1.7 x 2.7 cm previously.  No other suspicious pulmonary nodules or masses are identified.  No evidence of acute infiltrate.  No evidence of pleural or pericardial effusion.  No evidence of hilar or mediastinal lymphadenopathy.  No adenopathy seen elsewhere within the thorax. Small mixed lytic and sclerotic lesion in the sternal manubrium remains stable.  IMPRESSION:  1.  Mild increase in size of inferior portion of lobular right upper lobe mass. 2.  Stable small sternal bone metastasis. 3.  No new  sites of metastatic disease identified.   CT ABDOMEN AND PELVIS  Findings:  Prior cholecystectomy again noted and mild biliary duct dilatation is stable.  No liver masses are identified.  The pancreas, spleen, adrenal glands, and kidneys are normal appearance.  No soft tissue masses or lymphadenopathy identified within the abdomen or pelvis.  Prior hysterectomy again demonstrated.  Diverticulosis is again seen involving the sigmoid colon, however there is no evidence of diverticulitis.  No other inflammatory process or abnormal fluid collections are identified. L1 lucent lesion and old fracture deformity of the L3 vertebral body remains stable.  IMPRESSION:  1.  Stable appearance of bone metastases. 2.  No soft tissue metastatic disease identified within the abdomen or pelvis.  Original Report Authenticated By: Danae Orleans, M.D.   ASSESSMENT/PLAN: This is a very pleasant 78 years old white female with metastatic non-small cell lung cancer, adenocarcinoma. The patient is currently on treatment with Tarceva for almost more than 5 years now. She is tolerating it fine with no significant evidence for disease progression except for the mildly enlarged inferior portion of the lobular right upper lobe mass. The patient was discussed with Dr. Arbutus Ped. She will continue on her Tarceva at 100 mg by mouth daily. She'll return in one month for a symptom management visit with a repeat CBC differential and C. met. She was given a prescription for OxyContin 20 mg tablets to be taken every 12 hours for pain, total of 60 tablets with no refill.  Laural Benes, Emera Bussie E, PA-C   All questions were answered. The patient knows to call the clinic with any problems, questions or concerns. We can certainly see the patient much sooner if necessary.  I spent 20 minutes counseling the patient face to face. The total time spent in the appointment was 30 minutes.

## 2012-01-04 ENCOUNTER — Telehealth: Payer: Self-pay | Admitting: Internal Medicine

## 2012-01-04 NOTE — Telephone Encounter (Signed)
s/w pt and moved appt to allow md tx pt  aom

## 2012-01-25 ENCOUNTER — Encounter: Payer: Self-pay | Admitting: Physician Assistant

## 2012-01-25 ENCOUNTER — Ambulatory Visit (HOSPITAL_BASED_OUTPATIENT_CLINIC_OR_DEPARTMENT_OTHER): Payer: Medicare Other | Admitting: Physician Assistant

## 2012-01-25 ENCOUNTER — Ambulatory Visit: Payer: Medicare Other

## 2012-01-25 ENCOUNTER — Telehealth: Payer: Self-pay | Admitting: Internal Medicine

## 2012-01-25 ENCOUNTER — Other Ambulatory Visit (HOSPITAL_BASED_OUTPATIENT_CLINIC_OR_DEPARTMENT_OTHER): Payer: Medicare Other | Admitting: Lab

## 2012-01-25 VITALS — BP 155/69 | HR 73 | Temp 97.7°F

## 2012-01-25 VITALS — BP 141/60 | HR 71 | Temp 97.0°F | Resp 20 | Ht 66.0 in | Wt 137.8 lb

## 2012-01-25 DIAGNOSIS — C3410 Malignant neoplasm of upper lobe, unspecified bronchus or lung: Secondary | ICD-10-CM

## 2012-01-25 DIAGNOSIS — C3490 Malignant neoplasm of unspecified part of unspecified bronchus or lung: Secondary | ICD-10-CM

## 2012-01-25 DIAGNOSIS — C7951 Secondary malignant neoplasm of bone: Secondary | ICD-10-CM

## 2012-01-25 LAB — CBC WITH DIFFERENTIAL/PLATELET
Basophils Absolute: 0 10*3/uL (ref 0.0–0.1)
EOS%: 1.3 % (ref 0.0–7.0)
Eosinophils Absolute: 0.1 10*3/uL (ref 0.0–0.5)
HGB: 9.6 g/dL — ABNORMAL LOW (ref 11.6–15.9)
LYMPH%: 13.4 % — ABNORMAL LOW (ref 14.0–49.7)
MCH: 32.4 pg (ref 25.1–34.0)
MCV: 96.8 fL (ref 79.5–101.0)
MONO%: 8.3 % (ref 0.0–14.0)
NEUT#: 4.4 10*3/uL (ref 1.5–6.5)
Platelets: 200 10*3/uL (ref 145–400)
RBC: 2.96 10*6/uL — ABNORMAL LOW (ref 3.70–5.45)

## 2012-01-25 LAB — COMPREHENSIVE METABOLIC PANEL
Alkaline Phosphatase: 72 U/L (ref 39–117)
BUN: 18 mg/dL (ref 6–23)
Glucose, Bld: 85 mg/dL (ref 70–99)
Total Bilirubin: 0.7 mg/dL (ref 0.3–1.2)

## 2012-01-25 MED ORDER — SODIUM CHLORIDE 0.9 % IJ SOLN
10.0000 mL | INTRAMUSCULAR | Status: DC | PRN
  Administered 2012-01-25: 10 mL via INTRAVENOUS
  Filled 2012-01-25: qty 10

## 2012-01-25 MED ORDER — HEPARIN SOD (PORK) LOCK FLUSH 100 UNIT/ML IV SOLN
500.0000 [IU] | Freq: Once | INTRAVENOUS | Status: AC
  Administered 2012-01-25: 500 [IU] via INTRAVENOUS
  Filled 2012-01-25: qty 5

## 2012-01-25 NOTE — Telephone Encounter (Signed)
appts made and printed for pt aom °

## 2012-01-25 NOTE — Patient Instructions (Signed)
Call MD if any problems

## 2012-01-29 NOTE — Progress Notes (Signed)
[facility] [phone]   Fax:(336) [phone]  OFFICE PROGRESS NOTE  [name], MD [address] [place] [address]  DIAGNOSIS: Metastatic non-small cell lung cancer (T2 NX M1). Presented with right upper lobe lung mass as well as multiple pulmonary nodules and bone metastasis at L3 vertebral body. This was diagnosed in March of 2006.   PRIOR THERAPY:  1. Status post palliative radiotherapy to the L3 lesion under the care of Dr. Kathrynn Running completed September 17, 2004. 2. Status post 6 cycles of systemic chemotherapy with carboplatin and paclitaxel. Last dose was given January 01, 2005. 3. Status post palliative radiotherapy to the right upper lobe lung mass under the care of Dr. Kathrynn Running completed on 12/29/2010. 4. Status post treatment with Tarceva 150 mg p.o. daily started April 2007 through July 2012. CURRENT THERAPY:  1. Tarceva 100 mg p.o. daily started July 2012. 2. Zometa 4 mg IV q. 3 months for bone metastasis  INTERVAL HISTORY: Jamie Munoz 78 y.o. female returns to the clinic today for followup visit. The patient is doing fine with no specific complaints. She has a rash related to the Tarceva that affects her scalp, chest and shoulders. She is feeling fine otherwise. She is tolerating her treatment with Tarceva fairly well was no significant adverse effects. She's not had any issues with diarrhea.  She denied having any significant chest pain or shortness of breath, no cough or hemoptysis.The patient denied having any significant weight loss or night sweats, no nausea or vomiting.   MEDICAL HISTORY: Past Medical History  Diagnosis Date  . Cancer   . Lung cancer   . Hypertension     ALLERGIES:  is allergic to codeine.  MEDICATIONS:  Current Outpatient Prescriptions  Medication Sig Dispense Refill  . ALPRAZolam (XANAX) 0.25 MG tablet Take 0.25 mg by mouth at bedtime as needed.      . clindamycin (CLEOCIN-T) 1 % lotion Apply topically 2  (two) times daily.  60 mL  3  . docusate sodium (COLACE) 100 MG capsule Take 100 mg by mouth 2 (two) times daily.        Marland Kitchen erlotinib (TARCEVA) 100 MG tablet Take 1 tablet (100 mg total) by mouth daily. Take on an empty stomach 1 hour before meals or 2 hours after meals  30 tablet  2  . lidocaine-prilocaine (EMLA) cream Apply topically as needed.        Marland Kitchen NEURONTIN 100 MG capsule TAKE (2) CAPSULES THREE TIMES DAILY.  180 each  2  . olmesartan (BENICAR) 20 MG tablet Take 20 mg by mouth daily.        Marland Kitchen oxyCODONE (OXYCONTIN) 20 MG 12 hr tablet Take 1 tablet (20 mg total) by mouth every 12 (twelve) hours.  60 tablet  0  . TANDEM 162-115.2 MG CAPS TAKE (1) CAPSULE AS DIR- ECTED.  30 each  2  . temazepam (RESTORIL) 15 MG capsule Take 1 capsule (15 mg total) by mouth at bedtime as needed.  30 capsule  0  . DISCONTD: prochlorperazine (COMPAZINE) 10 MG tablet Take 10 mg by mouth every 6 (six) hours as needed.          REVIEW OF SYSTEMS:  A comprehensive review of systems was negative.   PHYSICAL EXAMINATION: General appearance: alert, cooperative and no distress Head: Normocephalic, without obvious abnormality, atraumatic Neck: no adenopathy Lymph nodes: Cervical, supraclavicular, and axillary nodes normal. Resp: clear to auscultation bilaterally Cardio: regular rate and  rhythm, S1, S2 normal, no murmur, click, rub or gallop GI: soft, non-tender; bowel sounds normal; no masses,  no organomegaly Extremities: extremities normal, atraumatic, no cyanosis or edema Neurologic: Alert and oriented X 3, normal strength and tone. Normal symmetric reflexes. Normal coordination and gait  ECOG PERFORMANCE STATUS: 1 - Symptomatic but completely ambulatory  Blood pressure 141/60, pulse 71, temperature 97 F (36.1 C), temperature source Oral, resp. rate 20, height 5\' 6"  (1.676 m), weight 137 lb 12.8 oz (62.506 kg).  LABORATORY DATA: Lab Results  Component Value Date   WBC 5.7 01/25/2012   HGB 9.6* 01/25/2012    HCT 28.6* 01/25/2012   MCV 96.8 01/25/2012   PLT 200 01/25/2012      Chemistry      Component Value Date/Time   NA 139 01/25/2012 1048   NA 139 09/02/2011 1406   K 4.5 01/25/2012 1048   K 3.9 09/02/2011 1406   CL 104 01/25/2012 1048   CL 97* 09/02/2011 1406   CO2 28 01/25/2012 1048   CO2 29 09/02/2011 1406   BUN 18 01/25/2012 1048   BUN 18 09/02/2011 1406   CREATININE 0.92 01/25/2012 1048   CREATININE 1.2 09/02/2011 1406      Component Value Date/Time   CALCIUM 8.5 01/25/2012 1048   CALCIUM 8.0 09/02/2011 1406   ALKPHOS 72 01/25/2012 1048   ALKPHOS 71 09/02/2011 1406   AST 19 01/25/2012 1048   AST 25 09/02/2011 1406   ALT 11 01/25/2012 1048   BILITOT 0.7 01/25/2012 1048   BILITOT 0.60 09/02/2011 1406       RADIOGRAPHIC STUDIES: Ct Chest W Contrast  11/25/2011  *RADIOLOGY REPORT*  Clinical Data:  Follow-up lung carcinoma.  Currently undergoing chemotherapy.  Restaging.   CT CHEST, ABDOMEN AND PELVIS WITH CONTRAST  Technique:  Multidetector CT imaging of the chest, abdomen and pelvis was performed following the standard protocol during bolus administration of intravenous contrast.  Contrast: OMNIPAQUE IOHEXOL 300 MG/ML  SOLN  Comparison:  09/02/2011  CT CHEST  Findings:  Posterior right upper lobe mass shows interval increase in size since previous study, with the inferior portion now measuring 2.6 x 3.1 cm compared to 1.7 x 2.7 cm previously.  No other suspicious pulmonary nodules or masses are identified.  No evidence of acute infiltrate.  No evidence of pleural or pericardial effusion.  No evidence of hilar or mediastinal lymphadenopathy.  No adenopathy seen elsewhere within the thorax. Small mixed lytic and sclerotic lesion in the sternal manubrium remains stable.  IMPRESSION:  1.  Mild increase in size of inferior portion of lobular right upper lobe mass. 2.  Stable small sternal bone metastasis. 3.  No new sites of metastatic disease identified.   CT ABDOMEN AND PELVIS  Findings:  Prior cholecystectomy  again noted and mild biliary duct dilatation is stable.  No liver masses are identified.  The pancreas, spleen, adrenal glands, and kidneys are normal appearance.  No soft tissue masses or lymphadenopathy identified within the abdomen or pelvis.  Prior hysterectomy again demonstrated.  Diverticulosis is again seen involving the sigmoid colon, however there is no evidence of diverticulitis.  No other inflammatory process or abnormal fluid collections are identified. L1 lucent lesion and old fracture deformity of the L3 vertebral body remains stable.  IMPRESSION:  1.  Stable appearance of bone metastases. 2.  No soft tissue metastatic disease identified within the abdomen or pelvis.  Original Report Authenticated By: Danae Orleans, M.D.   ASSESSMENT/PLAN: This is  a very pleasant 78 years old white female with metastatic non-small cell lung cancer, adenocarcinoma. The patient is currently on treatment with Tarceva for almost more than 5 years now. She is tolerating it fine with no significant evidence for disease progression except for the mildly enlarged inferior portion of the lobular right upper lobe mass. The patient was discussed with Dr. Arbutus Ped. She will continue on her Tarceva at 100 mg by mouth daily. She'll follow up with Dr. Arbutus Ped in one month for a symptom management visit with a repeat CBC differential and C. Met, and CT of the chest , abdomen and pelvis with contrast to re-evaluate her disease. She will be due for her Zometa infusion when she returns in one month.  Laural Benes, Brinden Kincheloe E, PA-C   All questions were answered. The patient knows to call the clinic with any problems, questions or concerns. We can certainly see the patient much sooner if necessary.  I spent 20 minutes counseling the patient face to face. The total time spent in the appointment was 30 minutes.

## 2012-02-07 ENCOUNTER — Other Ambulatory Visit: Payer: Self-pay | Admitting: Internal Medicine

## 2012-02-24 ENCOUNTER — Other Ambulatory Visit: Payer: Self-pay | Admitting: Oncology

## 2012-02-24 ENCOUNTER — Ambulatory Visit (HOSPITAL_COMMUNITY)
Admission: RE | Admit: 2012-02-24 | Discharge: 2012-02-24 | Disposition: A | Discharge status: Home / Self Care | Payer: Medicare Other | Source: Ambulatory Visit | Attending: Physician Assistant | Admitting: Physician Assistant

## 2012-02-24 DIAGNOSIS — C7951 Secondary malignant neoplasm of bone: Secondary | ICD-10-CM | POA: Insufficient documentation

## 2012-02-24 DIAGNOSIS — C3490 Malignant neoplasm of unspecified part of unspecified bronchus or lung: Secondary | ICD-10-CM | POA: Insufficient documentation

## 2012-02-24 MED ORDER — IOHEXOL 300 MG/ML  SOLN
100.0000 mL | Freq: Once | INTRAMUSCULAR | Status: AC | PRN
  Administered 2012-02-24: 100 mL via INTRAVENOUS

## 2012-02-29 ENCOUNTER — Ambulatory Visit (HOSPITAL_BASED_OUTPATIENT_CLINIC_OR_DEPARTMENT_OTHER): Payer: Medicare Other

## 2012-02-29 ENCOUNTER — Other Ambulatory Visit (HOSPITAL_BASED_OUTPATIENT_CLINIC_OR_DEPARTMENT_OTHER): Payer: Medicare Other | Admitting: Lab

## 2012-02-29 ENCOUNTER — Telehealth: Payer: Self-pay | Admitting: *Deleted

## 2012-02-29 ENCOUNTER — Telehealth: Payer: Self-pay | Admitting: Internal Medicine

## 2012-02-29 ENCOUNTER — Ambulatory Visit (HOSPITAL_BASED_OUTPATIENT_CLINIC_OR_DEPARTMENT_OTHER): Payer: Medicare Other | Admitting: Internal Medicine

## 2012-02-29 VITALS — BP 135/69 | HR 79 | Temp 97.0°F | Resp 18 | Ht 66.0 in | Wt 136.9 lb

## 2012-02-29 DIAGNOSIS — C3490 Malignant neoplasm of unspecified part of unspecified bronchus or lung: Secondary | ICD-10-CM

## 2012-02-29 DIAGNOSIS — C7951 Secondary malignant neoplasm of bone: Secondary | ICD-10-CM

## 2012-02-29 DIAGNOSIS — D649 Anemia, unspecified: Secondary | ICD-10-CM

## 2012-02-29 DIAGNOSIS — C3410 Malignant neoplasm of upper lobe, unspecified bronchus or lung: Secondary | ICD-10-CM

## 2012-02-29 LAB — COMPREHENSIVE METABOLIC PANEL (CC13)
ALT: 11 U/L (ref 0–55)
AST: 19 U/L (ref 5–34)
Albumin: 3.2 g/dL — ABNORMAL LOW (ref 3.5–5.0)
BUN: 18 mg/dL (ref 7.0–26.0)
Calcium: 8.4 mg/dL (ref 8.4–10.4)
Chloride: 104 mEq/L (ref 98–107)
Potassium: 4.1 mEq/L (ref 3.5–5.1)

## 2012-02-29 LAB — CBC WITH DIFFERENTIAL/PLATELET
BASO%: 0.3 % (ref 0.0–2.0)
HCT: 30.1 % — ABNORMAL LOW (ref 34.8–46.6)
HGB: 10 g/dL — ABNORMAL LOW (ref 11.6–15.9)
MCHC: 33.2 g/dL (ref 31.5–36.0)
MONO#: 0.6 10*3/uL (ref 0.1–0.9)
NEUT#: 6 10*3/uL (ref 1.5–6.5)
NEUT%: 78.2 % — ABNORMAL HIGH (ref 38.4–76.8)
WBC: 7.6 10*3/uL (ref 3.9–10.3)
lymph#: 0.9 10*3/uL (ref 0.9–3.3)

## 2012-02-29 MED ORDER — HEPARIN SOD (PORK) LOCK FLUSH 100 UNIT/ML IV SOLN
500.0000 [IU] | Freq: Once | INTRAVENOUS | Status: AC | PRN
  Administered 2012-02-29: 500 [IU]
  Filled 2012-02-29: qty 5

## 2012-02-29 MED ORDER — DEXAMETHASONE 4 MG PO TABS: 4.0000 mg | ORAL_TABLET | Freq: Two times a day (BID) | ORAL | Status: AC

## 2012-02-29 MED ORDER — SODIUM CHLORIDE 0.9 % IJ SOLN
10.0000 mL | INTRAMUSCULAR | Status: DC | PRN
Start: 2012-02-29 — End: 2012-02-29
  Administered 2012-02-29: 10 mL
  Filled 2012-02-29: qty 10

## 2012-02-29 MED ORDER — PROCHLORPERAZINE MALEATE 10 MG PO TABS: 10.0000 mg | ORAL_TABLET | Freq: Four times a day (QID) | ORAL | Status: DC | PRN

## 2012-02-29 MED ORDER — CYANOCOBALAMIN 1000 MCG/ML IJ SOLN
1000.0000 ug | Freq: Once | INTRAMUSCULAR | Status: AC
  Administered 2012-02-29: 1000 ug via INTRAMUSCULAR

## 2012-02-29 MED ORDER — FOLIC ACID 1 MG PO TABS: 1.0000 mg | ORAL_TABLET | Freq: Every day | ORAL | Status: DC

## 2012-02-29 MED ORDER — SODIUM CHLORIDE 0.9 % IV SOLN
Freq: Once | INTRAVENOUS | Status: AC
  Administered 2012-02-29: 11:00:00 via INTRAVENOUS

## 2012-02-29 MED ORDER — ZOLEDRONIC ACID 4 MG/5ML IV CONC
4.0000 mg | Freq: Once | INTRAVENOUS | Status: AC
  Administered 2012-02-29: 4 mg via INTRAVENOUS
  Filled 2012-02-29: qty 5

## 2012-02-29 MED ORDER — OXYCODONE HCL 20 MG PO TB12: 20.0000 mg | ORAL_TABLET | Freq: Two times a day (BID) | ORAL | Status: DC

## 2012-02-29 NOTE — Progress Notes (Signed)
[facility] [phone]   [phone]  OFFICE PROGRESS NOTE  Jamie Hoit, MD [address] [place] [address]  DIAGNOSIS: Metastatic non-small cell lung cancer (T2 NX M1). Presented with right upper lobe lung mass as well as multiple pulmonary nodules and bone metastasis at L3 vertebral body. This was diagnosed in March of 2006.   PRIOR THERAPY:  1. Status post palliative radiotherapy to the L3 lesion under the care of Dr. Kathrynn Munoz completed September 17, 2004. 2. Status post 6 cycles of systemic chemotherapy with carboplatin and paclitaxel. Last dose was given January 01, 2005. 3. Status post palliative radiotherapy to the right upper lobe lung mass under the care of Dr. Kathrynn Munoz completed on 12/29/2010. 4. Status post treatment with Tarceva 150 mg p.o. daily started April 2007 through July 2012.  CURRENT THERAPY:  1. Tarceva 100 mg p.o. daily started July 2012. 2. Zometa 4 mg IV q. 3 months for bone metastasis 3.   INTERVAL HISTORY: Jamie Munoz 78 y.o. female returns to the clinic today for followup visit accompanied by her sister. The patient is feeling fine today with no specific complaints. She denied having any significant chest pain or shortness breath, no cough or hemoptysis. She denied having any significant weight loss or night sweats. She continues to have mild skin rash and occasional diarrhea but tolerating her treatment with Tarceva fairly well. The patient has repeat CT scan of the chest, abdomen and pelvis performed recently and she is here for evaluation and discussion of her scan results.  MEDICAL HISTORY: Past Medical History  Diagnosis Date  . Cancer   . Lung cancer   . Hypertension     ALLERGIES:  is allergic to codeine.  MEDICATIONS:  Current Outpatient Prescriptions  Medication Sig Dispense Refill  . ALPRAZolam (XANAX) 0.25 MG tablet Take 0.25 mg by mouth at bedtime as needed.      . clindamycin (CLEOCIN-T) 1 %  lotion Apply topically 2 (two) times daily.  60 mL  3  . docusate sodium (COLACE) 100 MG capsule Take 100 mg by mouth 2 (two) times daily.        Marland Kitchen erlotinib (TARCEVA) 100 MG tablet Take 1 tablet (100 mg total) by mouth daily. Take on an empty stomach 1 hour before meals or 2 hours after meals  30 tablet  2  . lidocaine-prilocaine (EMLA) cream Apply topically as needed.        Marland Kitchen NEURONTIN 100 MG capsule TAKE (2) CAPSULES THREE TIMES DAILY.  180 each  1  . olmesartan (BENICAR) 20 MG tablet Take 20 mg by mouth daily.        Marland Kitchen oxyCODONE (OXYCONTIN) 20 MG 12 hr tablet Take 1 tablet (20 mg total) by mouth every 12 (twelve) hours.  60 tablet  0  . TANDEM 162-115.2 MG CAPS TAKE (1) CAPSULE AS DIR- ECTED.  30 each  2  . temazepam (RESTORIL) 15 MG capsule Take 1 capsule (15 mg total) by mouth at bedtime as needed.  30 capsule  0  . DISCONTD: prochlorperazine (COMPAZINE) 10 MG tablet Take 10 mg by mouth every 6 (six) hours as needed.          REVIEW OF SYSTEMS:  A comprehensive review of systems was negative.   PHYSICAL EXAMINATION: General appearance: alert, cooperative and no distress Head: Normocephalic, without obvious abnormality, atraumatic Neck: no adenopathy Lymph nodes: Cervical, supraclavicular, and axillary nodes normal. Resp: clear to auscultation bilaterally  Back: symmetric, no curvature. ROM normal. No CVA tenderness. Cardio: regular rate and rhythm, S1, S2 normal, no murmur, click, rub or gallop GI: soft, non-tender; bowel sounds normal; no masses,  no organomegaly Extremities: extremities normal, atraumatic, no cyanosis or edema Neurologic: Alert and oriented X 3, normal strength and tone. Normal symmetric reflexes. Normal coordination and gait  ECOG PERFORMANCE STATUS: 1 - Symptomatic but completely ambulatory  There were no vitals taken for this visit.  LABORATORY DATA: Lab Results  Component Value Date   WBC 5.7 01/25/2012   HGB 9.6* 01/25/2012   HCT 28.6* 01/25/2012   MCV 96.8  01/25/2012   PLT 200 01/25/2012      Chemistry      Component Value Date/Time   NA 139 01/25/2012 1048   NA 139 09/02/2011 1406   K 4.5 01/25/2012 1048   K 3.9 09/02/2011 1406   CL 104 01/25/2012 1048   CL 97* 09/02/2011 1406   CO2 28 01/25/2012 1048   CO2 29 09/02/2011 1406   BUN 18 01/25/2012 1048   BUN 18 09/02/2011 1406   CREATININE 0.92 01/25/2012 1048   CREATININE 1.2 09/02/2011 1406      Component Value Date/Time   CALCIUM 8.5 01/25/2012 1048   CALCIUM 8.0 09/02/2011 1406   ALKPHOS 72 01/25/2012 1048   ALKPHOS 71 09/02/2011 1406   AST 19 01/25/2012 1048   AST 25 09/02/2011 1406   ALT 11 01/25/2012 1048   BILITOT 0.7 01/25/2012 1048   BILITOT 0.60 09/02/2011 1406       RADIOGRAPHIC STUDIES: Ct Chest W Contrast  02/24/2012  *RADIOLOGY REPORT*  Clinical Data:  Lung cancer restaging  CT CHEST, ABDOMEN AND PELVIS WITH CONTRAST  Technique:  Multidetector CT imaging of the chest, abdomen and pelvis was performed following the standard protocol during bolus administration of intravenous contrast.  Contrast: OMNIPAQUE IOHEXOL 300 MG/ML  SOLN  Comparison:  CT 11/25/2011   CT CHEST  Findings:  The bilobed lesion in the right upper lobe is increased in size compared to prior.  The upper portion the lesion measures 18 mm in diameter (image #14) increased from 12 mm on prior.  The inferior portion measures 32 x 29 mm increased from 27 x 25 mm on prior.  No new pulmonary nodules on the left or right.  There is a port in the right anterior chest wall.  No axillary supraclavicular lymphadenopathy.  Right lower paratracheal lymph nodes measures 14 mm similar 14 mm on prior.  No pericardial fluid. Esophagus is gas filled.  IMPRESSION:  1.  Continued enlargement of the right upper lobe mass. 2.  Right lower paratracheal lymph node is not changed in size but concerning for metastasis.   CT ABDOMEN AND PELVIS  Findings:  There is no focal hepatic lesion.  There is a biliary duct dilatation in the left greater than right  hepatic lobe similar to the multiple comparison exams.  Pancreas is normal.  The spleen, adrenal glands, kidneys are unchanged.  The stomach, small bowel, colon are normal.  Abdominal aorta is normal caliber.  No retroperitoneal periportal lymphadenopathy.  No free fluid the pelvis.  No pelvic lymphadenopathy.  Post hysterectomy anatomy.  Stable metastasis within the manubrium and L1 vertebral body.  The chronic compression deformity at L3  IMPRESSION:  1.  No evidence metastasis in the soft tissue abdomen or  pelvis.  2.  Stable bone metastasis.   Original Report Authenticated By: Genevive Bi, M.D.  ASSESSMENT: This is a very pleasant 78 years old white female with metastatic non-small cell lung cancer diagnosed in March of 2006 and has been on several treatment in the past including Tarceva for more than 6 years. Unfortunately the patient has evidence for disease progression especially in the chest.   PLAN: I have a lengthy discussion with the patient and her sister today about her current disease status and treatment options. I recommend for the patient to discontinue treatment was Tarceva at this point. I would consider her for systemic chemotherapy with carboplatin for AUC of 5 and Alimta 500 mg/M2 every 3 weeks. I discussed with the patient adverse effect of this treatment including but not limited to alopecia, myelosuppression, nausea and vomiting, peripheral neuropathy, liver or renal dysfunction. I will arrange for the patient to have vitamin B12 injection today. I would also call her pharmacy with prescription for Compazine 10 mg by mouth every 6 hours as needed for nausea, Decadron 4 mg by mouth twice a day the day before, day of and day after the chemotherapy in addition to folic acid 1 mg by mouth daily. I expect the patient to start the first cycle of this treatment next week. The patient agreed to the current plan. She will continue on Zometa 4 mg IV every 3 months for her bone  metastasis. The patient was given a refill of OxyContin 20 mg by mouth and she usually take it only at nighttime. She would come back for followup visit in one month for reevaluation and management any adverse effect of her chemotherapy. She was advised to call me immediately if she has any concerning symptoms in the interval.  All questions were answered. The patient knows to call the clinic with any problems, questions or concerns. We can certainly see the patient much sooner if necessary.  I spent 20 minutes counseling the patient face to face. The total time spent in the appointment was 30 minutes.

## 2012-02-29 NOTE — Telephone Encounter (Signed)
gve the pt her sept,oct 2013 appt calendar. Sent michelle a staff message to add the chemo appts

## 2012-02-29 NOTE — Telephone Encounter (Signed)
Per staff message and POf I have scheduled appts.  JMW  

## 2012-02-29 NOTE — Patient Instructions (Signed)
The scan showed evidence for disease progression. We will discontinue treatment with Tarceva at this point. Other treatment options were discussed including systemic chemotherapy with carboplatin and Alimta. You're to start the first dose of this treatment next week. Followup in one month.

## 2012-03-07 ENCOUNTER — Other Ambulatory Visit: Payer: Medicare Other | Admitting: Lab

## 2012-03-08 ENCOUNTER — Ambulatory Visit (HOSPITAL_BASED_OUTPATIENT_CLINIC_OR_DEPARTMENT_OTHER): Payer: Medicare Other

## 2012-03-08 ENCOUNTER — Other Ambulatory Visit: Payer: Self-pay | Admitting: Internal Medicine

## 2012-03-08 ENCOUNTER — Other Ambulatory Visit (HOSPITAL_BASED_OUTPATIENT_CLINIC_OR_DEPARTMENT_OTHER): Payer: Medicare Other | Admitting: Lab

## 2012-03-08 VITALS — BP 114/55 | HR 61 | Temp 98.5°F

## 2012-03-08 DIAGNOSIS — C3410 Malignant neoplasm of upper lobe, unspecified bronchus or lung: Secondary | ICD-10-CM

## 2012-03-08 DIAGNOSIS — C3490 Malignant neoplasm of unspecified part of unspecified bronchus or lung: Secondary | ICD-10-CM

## 2012-03-08 DIAGNOSIS — C7951 Secondary malignant neoplasm of bone: Secondary | ICD-10-CM

## 2012-03-08 DIAGNOSIS — Z5111 Encounter for antineoplastic chemotherapy: Secondary | ICD-10-CM

## 2012-03-08 LAB — CBC WITH DIFFERENTIAL/PLATELET
BASO%: 0.1 % (ref 0.0–2.0)
LYMPH%: 4.7 % — ABNORMAL LOW (ref 14.0–49.7)
MCHC: 34.1 g/dL (ref 31.5–36.0)
MCV: 94.3 fL (ref 79.5–101.0)
MONO%: 3.8 % (ref 0.0–14.0)
Platelets: 230 10*3/uL (ref 145–400)
RBC: 2.8 10*6/uL — ABNORMAL LOW (ref 3.70–5.45)

## 2012-03-08 LAB — COMPREHENSIVE METABOLIC PANEL (CC13)
ALT: 14 U/L (ref 0–55)
Alkaline Phosphatase: 82 U/L (ref 40–150)
Sodium: 136 mEq/L (ref 136–145)
Total Bilirubin: 0.3 mg/dL (ref 0.20–1.20)
Total Protein: 6.9 g/dL (ref 6.4–8.3)

## 2012-03-08 MED ORDER — ONDANSETRON 16 MG/50ML IVPB (CHCC)
16.0000 mg | Freq: Once | INTRAVENOUS | Status: AC
  Administered 2012-03-08: 16 mg via INTRAVENOUS

## 2012-03-08 MED ORDER — SODIUM CHLORIDE 0.9 % IJ SOLN
10.0000 mL | INTRAMUSCULAR | Status: DC | PRN
  Filled 2012-03-08: qty 10

## 2012-03-08 MED ORDER — SODIUM CHLORIDE 0.9 % IV SOLN
357.0000 mg | Freq: Once | INTRAVENOUS | Status: AC
  Administered 2012-03-08: 360 mg via INTRAVENOUS
  Filled 2012-03-08: qty 36

## 2012-03-08 MED ORDER — HEPARIN SOD (PORK) LOCK FLUSH 100 UNIT/ML IV SOLN
500.0000 [IU] | Freq: Once | INTRAVENOUS | Status: DC | PRN
  Filled 2012-03-08: qty 5

## 2012-03-08 MED ORDER — DEXAMETHASONE SODIUM PHOSPHATE 4 MG/ML IJ SOLN
20.0000 mg | Freq: Once | INTRAMUSCULAR | Status: AC
  Administered 2012-03-08: 20 mg via INTRAVENOUS

## 2012-03-08 MED ORDER — SODIUM CHLORIDE 0.9 % IV SOLN
500.0000 mg/m2 | Freq: Once | INTRAVENOUS | Status: AC
  Administered 2012-03-08: 850 mg via INTRAVENOUS
  Filled 2012-03-08: qty 34

## 2012-03-08 MED ORDER — SODIUM CHLORIDE 0.9 % IV SOLN
Freq: Once | INTRAVENOUS | Status: AC
  Administered 2012-03-08: 09:00:00 via INTRAVENOUS

## 2012-03-08 NOTE — Patient Instructions (Signed)
[facility] Discharge Instructions for Patients Receiving Chemotherapy  Today you received the following chemotherapy agents Alimta/Carboplatin To help prevent nausea and vomiting after your treatment, we encourage you to take your nausea medication as prescribed.  If you develop nausea and vomiting that is not controlled by your nausea medication, call the clinic. If it is after clinic hours your family physician or the after hours number for the clinic or go to the Emergency Department.   BELOW ARE SYMPTOMS THAT SHOULD BE REPORTED IMMEDIATELY:  *FEVER GREATER THAN 100.5 F  *CHILLS WITH OR WITHOUT FEVER  NAUSEA AND VOMITING THAT IS NOT CONTROLLED WITH YOUR NAUSEA MEDICATION  *UNUSUAL SHORTNESS OF BREATH  *UNUSUAL BRUISING OR BLEEDING  TENDERNESS IN MOUTH AND THROAT WITH OR WITHOUT PRESENCE OF ULCERS  *URINARY PROBLEMS  *BOWEL PROBLEMS  UNUSUAL RASH Items with * indicate a potential emergency and should be followed up as soon as possible.  One of the nurses will contact you 24 hours after your treatment. Please let the nurse know about any problems that you may have experienced. Feel free to call the clinic you have any questions or concerns. The clinic phone number is [phone].   I have been informed and understand all the instructions given to me. I know to contact the clinic, my physician, or go to the Emergency Department if any problems should occur. I do not have any questions at this time, but understand that I may call the clinic during office hours   should I have any questions or need assistance in obtaining follow up care.    __________________________________________  _____________  __________ Signature of Patient or Authorized Representative            Date                   Time    __________________________________________ Nurse's Signature    

## 2012-03-12 ENCOUNTER — Encounter: Payer: Self-pay | Admitting: Pharmacist

## 2012-03-14 ENCOUNTER — Other Ambulatory Visit: Payer: Self-pay | Admitting: Medical Oncology

## 2012-03-14 ENCOUNTER — Other Ambulatory Visit: Payer: Medicare Other | Admitting: Lab

## 2012-03-14 DIAGNOSIS — K121 Other forms of stomatitis: Secondary | ICD-10-CM

## 2012-03-14 MED ORDER — MAGIC MOUTHWASH: 5.0000 mL | Freq: Four times a day (QID) | ORAL | Status: DC | PRN

## 2012-03-14 NOTE — Telephone Encounter (Signed)
reports stomatitis and splotchy rash that started on her back last night and on chest today . + itching ,  denies dyspnea or any other allergic symptoms. Per Dr Donnald Garre I called in magic mouthwash and told pt to take benadryl for the rash

## 2012-03-15 ENCOUNTER — Other Ambulatory Visit (HOSPITAL_BASED_OUTPATIENT_CLINIC_OR_DEPARTMENT_OTHER): Payer: Medicare Other | Admitting: Lab

## 2012-03-15 DIAGNOSIS — C3490 Malignant neoplasm of unspecified part of unspecified bronchus or lung: Secondary | ICD-10-CM

## 2012-03-15 DIAGNOSIS — C3410 Malignant neoplasm of upper lobe, unspecified bronchus or lung: Secondary | ICD-10-CM

## 2012-03-15 LAB — CBC WITH DIFFERENTIAL/PLATELET
BASO%: 0.2 % (ref 0.0–2.0)
EOS%: 5.3 % (ref 0.0–7.0)
Eosinophils Absolute: 0.1 10*3/uL (ref 0.0–0.5)
LYMPH%: 21.8 % (ref 14.0–49.7)
MCH: 32.7 pg (ref 25.1–34.0)
MCHC: 34.4 g/dL (ref 31.5–36.0)
MCV: 95.2 fL (ref 79.5–101.0)
MONO%: 7.5 % (ref 0.0–14.0)
NEUT#: 1.2 10*3/uL — ABNORMAL LOW (ref 1.5–6.5)
RBC: 2.87 10*6/uL — ABNORMAL LOW (ref 3.70–5.45)
RDW: 13.7 % (ref 11.2–14.5)

## 2012-03-15 LAB — COMPREHENSIVE METABOLIC PANEL (CC13)
ALT: 24 U/L (ref 0–55)
AST: 31 U/L (ref 5–34)
BUN: 18 mg/dL (ref 7.0–26.0)
Calcium: 8.2 mg/dL — ABNORMAL LOW (ref 8.4–10.4)
Chloride: 103 mEq/L (ref 98–107)
Creatinine: 1.1 mg/dL (ref 0.6–1.1)
Total Bilirubin: 0.7 mg/dL (ref 0.20–1.20)

## 2012-03-21 ENCOUNTER — Other Ambulatory Visit: Payer: Medicare Other | Admitting: Lab

## 2012-03-22 ENCOUNTER — Other Ambulatory Visit (HOSPITAL_BASED_OUTPATIENT_CLINIC_OR_DEPARTMENT_OTHER): Payer: Medicare Other | Admitting: Lab

## 2012-03-22 DIAGNOSIS — C3490 Malignant neoplasm of unspecified part of unspecified bronchus or lung: Secondary | ICD-10-CM

## 2012-03-22 DIAGNOSIS — C3410 Malignant neoplasm of upper lobe, unspecified bronchus or lung: Secondary | ICD-10-CM

## 2012-03-22 LAB — CBC WITH DIFFERENTIAL/PLATELET
Basophils Absolute: 0 10*3/uL (ref 0.0–0.1)
HCT: 24.3 % — ABNORMAL LOW (ref 34.8–46.6)
HGB: 8.5 g/dL — ABNORMAL LOW (ref 11.6–15.9)
LYMPH%: 21.4 % (ref 14.0–49.7)
MCHC: 34.8 g/dL (ref 31.5–36.0)
MONO#: 0.5 10*3/uL (ref 0.1–0.9)
NEUT%: 59.1 % (ref 38.4–76.8)
Platelets: 91 10*3/uL — ABNORMAL LOW (ref 145–400)
WBC: 3 10*3/uL — ABNORMAL LOW (ref 3.9–10.3)
lymph#: 0.6 10*3/uL — ABNORMAL LOW (ref 0.9–3.3)

## 2012-03-22 LAB — COMPREHENSIVE METABOLIC PANEL (CC13)
BUN: 17 mg/dL (ref 7.0–26.0)
CO2: 25 mEq/L (ref 22–29)
Calcium: 8.4 mg/dL (ref 8.4–10.4)
Chloride: 105 mEq/L (ref 98–107)
Creatinine: 0.8 mg/dL (ref 0.6–1.1)
Glucose: 85 mg/dl (ref 70–99)
Total Bilirubin: 0.3 mg/dL (ref 0.20–1.20)

## 2012-03-26 ENCOUNTER — Other Ambulatory Visit: Payer: Self-pay | Admitting: *Deleted

## 2012-03-26 DIAGNOSIS — C7951 Secondary malignant neoplasm of bone: Secondary | ICD-10-CM

## 2012-03-26 DIAGNOSIS — C3410 Malignant neoplasm of upper lobe, unspecified bronchus or lung: Secondary | ICD-10-CM

## 2012-03-26 MED ORDER — TEMAZEPAM 15 MG PO CAPS: 15.0000 mg | ORAL_CAPSULE | Freq: Every evening | ORAL | Status: DC | PRN

## 2012-03-28 ENCOUNTER — Other Ambulatory Visit: Payer: Medicare Other | Admitting: Lab

## 2012-03-28 ENCOUNTER — Ambulatory Visit: Payer: Medicare Other | Admitting: Physician Assistant

## 2012-03-29 ENCOUNTER — Telehealth: Payer: Self-pay | Admitting: Internal Medicine

## 2012-03-29 ENCOUNTER — Ambulatory Visit (HOSPITAL_BASED_OUTPATIENT_CLINIC_OR_DEPARTMENT_OTHER): Payer: Medicare Other

## 2012-03-29 ENCOUNTER — Telehealth: Payer: Self-pay | Admitting: *Deleted

## 2012-03-29 ENCOUNTER — Ambulatory Visit (HOSPITAL_BASED_OUTPATIENT_CLINIC_OR_DEPARTMENT_OTHER): Payer: Medicare Other | Admitting: Physician Assistant

## 2012-03-29 ENCOUNTER — Encounter: Payer: Self-pay | Admitting: Physician Assistant

## 2012-03-29 ENCOUNTER — Other Ambulatory Visit (HOSPITAL_BASED_OUTPATIENT_CLINIC_OR_DEPARTMENT_OTHER): Payer: Medicare Other

## 2012-03-29 VITALS — BP 151/66 | HR 75 | Temp 99.3°F | Resp 18 | Ht 66.0 in | Wt 139.7 lb

## 2012-03-29 DIAGNOSIS — C7951 Secondary malignant neoplasm of bone: Secondary | ICD-10-CM

## 2012-03-29 DIAGNOSIS — C3490 Malignant neoplasm of unspecified part of unspecified bronchus or lung: Secondary | ICD-10-CM

## 2012-03-29 DIAGNOSIS — C3410 Malignant neoplasm of upper lobe, unspecified bronchus or lung: Secondary | ICD-10-CM

## 2012-03-29 DIAGNOSIS — C7952 Secondary malignant neoplasm of bone marrow: Secondary | ICD-10-CM

## 2012-03-29 DIAGNOSIS — Z5111 Encounter for antineoplastic chemotherapy: Secondary | ICD-10-CM

## 2012-03-29 LAB — COMPREHENSIVE METABOLIC PANEL (CC13)
AST: 20 U/L (ref 5–34)
Albumin: 3.5 g/dL (ref 3.5–5.0)
BUN: 18 mg/dL (ref 7.0–26.0)
CO2: 20 mEq/L — ABNORMAL LOW (ref 22–29)
Calcium: 8.3 mg/dL — ABNORMAL LOW (ref 8.4–10.4)
Chloride: 106 mEq/L (ref 98–107)
Potassium: 3.9 mEq/L (ref 3.5–5.1)

## 2012-03-29 LAB — CBC WITH DIFFERENTIAL/PLATELET
BASO%: 0.1 % (ref 0.0–2.0)
Basophils Absolute: 0 10*3/uL (ref 0.0–0.1)
Eosinophils Absolute: 0 10*3/uL (ref 0.0–0.5)
HCT: 26 % — ABNORMAL LOW (ref 34.8–46.6)
HGB: 8.7 g/dL — ABNORMAL LOW (ref 11.6–15.9)
MCHC: 33.5 g/dL (ref 31.5–36.0)
MONO#: 0.8 10*3/uL (ref 0.1–0.9)
NEUT#: 5.5 10*3/uL (ref 1.5–6.5)
NEUT%: 79.6 % — ABNORMAL HIGH (ref 38.4–76.8)
Platelets: 221 10*3/uL (ref 145–400)
WBC: 6.9 10*3/uL (ref 3.9–10.3)
lymph#: 0.6 10*3/uL — ABNORMAL LOW (ref 0.9–3.3)

## 2012-03-29 MED ORDER — DEXAMETHASONE SODIUM PHOSPHATE 4 MG/ML IJ SOLN
20.0000 mg | Freq: Once | INTRAMUSCULAR | Status: AC
  Administered 2012-03-29: 20 mg via INTRAVENOUS

## 2012-03-29 MED ORDER — SODIUM CHLORIDE 0.9 % IJ SOLN
10.0000 mL | INTRAMUSCULAR | Status: DC | PRN
  Administered 2012-03-29: 10 mL
  Filled 2012-03-29: qty 10

## 2012-03-29 MED ORDER — ONDANSETRON 16 MG/50ML IVPB (CHCC)
16.0000 mg | Freq: Once | INTRAVENOUS | Status: AC
  Administered 2012-03-29: 16 mg via INTRAVENOUS

## 2012-03-29 MED ORDER — SODIUM CHLORIDE 0.9 % IV SOLN
500.0000 mg/m2 | Freq: Once | INTRAVENOUS | Status: AC
  Administered 2012-03-29: 850 mg via INTRAVENOUS
  Filled 2012-03-29: qty 34

## 2012-03-29 MED ORDER — SODIUM CHLORIDE 0.9 % IV SOLN
Freq: Once | INTRAVENOUS | Status: AC
  Administered 2012-03-29: 12:00:00 via INTRAVENOUS

## 2012-03-29 MED ORDER — HEPARIN SOD (PORK) LOCK FLUSH 100 UNIT/ML IV SOLN
500.0000 [IU] | Freq: Once | INTRAVENOUS | Status: AC | PRN
  Administered 2012-03-29: 500 [IU]
  Filled 2012-03-29: qty 5

## 2012-03-29 MED ORDER — SODIUM CHLORIDE 0.9 % IV SOLN
360.0000 mg | Freq: Once | INTRAVENOUS | Status: AC
  Administered 2012-03-29: 360 mg via INTRAVENOUS
  Filled 2012-03-29: qty 36

## 2012-03-29 MED ORDER — SODIUM CHLORIDE 0.9 % IJ SOLN
100.0000 ug | Freq: Once | INTRAVENOUS | Status: AC
  Administered 2012-03-29: 0.1 mg via INTRADERMAL
  Filled 2012-03-29: qty 0.01

## 2012-03-29 NOTE — Patient Instructions (Addendum)
Continue weekly labs Follow up in 3 weeks prior to your next cycle of chemotherapy 

## 2012-03-29 NOTE — Telephone Encounter (Signed)
gv and printed an appt schedule for Oct for pt

## 2012-03-29 NOTE — Progress Notes (Signed)
Carbo test dose placed on left inner arm at 1240. Site read at 1245, 1255 and 1310, all readings were negative.

## 2012-03-29 NOTE — Telephone Encounter (Signed)
Per staff message and POF I have scheduled appts.  JMW  

## 2012-03-29 NOTE — Patient Instructions (Addendum)
[facility] Discharge Instructions for Patients Receiving Chemotherapy  Today you received the following chemotherapy agents Alimta and Carboplatin.  To help prevent nausea and vomiting after your treatment, we encourage you to take your nausea medication as prescribed.   If you develop nausea and vomiting that is not controlled by your nausea medication, call the clinic. If it is after clinic hours your family physician or the after hours number for the clinic or go to the Emergency Department.   BELOW ARE SYMPTOMS THAT SHOULD BE REPORTED IMMEDIATELY:  *FEVER GREATER THAN 100.5 F  *CHILLS WITH OR WITHOUT FEVER  NAUSEA AND VOMITING THAT IS NOT CONTROLLED WITH YOUR NAUSEA MEDICATION  *UNUSUAL SHORTNESS OF BREATH  *UNUSUAL BRUISING OR BLEEDING  TENDERNESS IN MOUTH AND THROAT WITH OR WITHOUT PRESENCE OF ULCERS  *URINARY PROBLEMS  *BOWEL PROBLEMS  UNUSUAL RASH Items with * indicate a potential emergency and should be followed up as soon as possible.  One of the nurses will contact you 24 hours after your treatment. Please let the nurse know about any problems that you may have experienced. Feel free to call the clinic you have any questions or concerns. The clinic phone number is [phone].   I have been informed and understand all the instructions given to me. I know to contact the clinic, my physician, or go to the Emergency Department if any problems should occur. I do not have any questions at this time, but understand that I may call the clinic during office hours   should I have any questions or need assistance in obtaining follow up care.    __________________________________________  _____________  __________ Signature of Patient or Authorized Representative            Date                   Time    __________________________________________ Nurse's Signature

## 2012-04-02 ENCOUNTER — Other Ambulatory Visit: Payer: Self-pay | Admitting: *Deleted

## 2012-04-02 NOTE — Progress Notes (Signed)
Open by error

## 2012-04-04 ENCOUNTER — Other Ambulatory Visit: Payer: Medicare Other | Admitting: Lab

## 2012-04-05 ENCOUNTER — Other Ambulatory Visit (HOSPITAL_BASED_OUTPATIENT_CLINIC_OR_DEPARTMENT_OTHER): Payer: Medicare Other | Admitting: Lab

## 2012-04-05 DIAGNOSIS — C3410 Malignant neoplasm of upper lobe, unspecified bronchus or lung: Secondary | ICD-10-CM

## 2012-04-05 DIAGNOSIS — C3490 Malignant neoplasm of unspecified part of unspecified bronchus or lung: Secondary | ICD-10-CM

## 2012-04-05 LAB — COMPREHENSIVE METABOLIC PANEL (CC13)
ALT: 19 U/L (ref 0–55)
AST: 26 U/L (ref 5–34)
Alkaline Phosphatase: 83 U/L (ref 40–150)
CO2: 22 mEq/L (ref 22–29)
Creatinine: 0.8 mg/dL (ref 0.6–1.1)
Sodium: 135 mEq/L — ABNORMAL LOW (ref 136–145)
Total Bilirubin: 0.7 mg/dL (ref 0.20–1.20)
Total Protein: 6.2 g/dL — ABNORMAL LOW (ref 6.4–8.3)

## 2012-04-05 LAB — CBC WITH DIFFERENTIAL/PLATELET
BASO%: 0.3 % (ref 0.0–2.0)
EOS%: 1.6 % (ref 0.0–7.0)
LYMPH%: 36.6 % (ref 14.0–49.7)
MCH: 33.8 pg (ref 25.1–34.0)
MCHC: 35.1 g/dL (ref 31.5–36.0)
MONO#: 0.1 10*3/uL (ref 0.1–0.9)
NEUT%: 54.6 % (ref 38.4–76.8)
Platelets: 170 10*3/uL (ref 145–400)
RBC: 2.54 10*6/uL — ABNORMAL LOW (ref 3.70–5.45)
WBC: 1.1 10*3/uL — ABNORMAL LOW (ref 3.9–10.3)

## 2012-04-05 NOTE — Progress Notes (Signed)
[facility] Telephone:([phone]) [phone]   [phone]  OFFICE PROGRESS NOTE  [name], MD [address] [place] [address]  DIAGNOSIS: Metastatic non-small cell lung cancer (T2 NX M1). Presented with right upper lobe lung mass as well as multiple pulmonary nodules and bone metastasis at L3 vertebral body. This was diagnosed in March of 2006.   PRIOR THERAPY:  1. Status post palliative radiotherapy to the L3 lesion under the care of Dr. Kathrynn Running completed September 17, 2004. 2. Status post 6 cycles of systemic chemotherapy with carboplatin and paclitaxel. Last dose was given January 01, 2005. 3. Status post palliative radiotherapy to the right upper lobe lung mass under the care of Dr. Kathrynn Running completed on 12/29/2010. 4. Status post treatment with Tarceva 150 mg p.o. daily started April 2007 through July 2012. 5. Tarceva 100 mg p.o. daily started July 2012.  CURRENT THERAPY:  1. Systemic chemotherapy with Carboplatin for AUC 5 and Alimta 500 mg/m2 given every 3 weeks. Status post 1 cycle. 2.  Zometa 4 mg IV q. 3 months for bone metastasis  INTERVAL HISTORY: Jamie Munoz 78 y.o. female returns to the clinic today for followup visit accompanied by her sister. The patient is feeling fine today with no specific complaints. She felt weak and had nausea and mucositis after her first cycle of chemotherapy. Magic mouthwash helped a little with the mucositis. She denied having any significant chest pain or shortness breath, no cough or hemoptysis. She denied having any significant weight loss or night sweats. She continues to have mild skin rash and occasional diarrhea but tolerating her treatment with Tarceva fairly well. The patient has repeat CT scan of the chest, abdomen and pelvis performed recently and she is here for evaluation and discussion of her scan results.  MEDICAL HISTORY: Past Medical History  Diagnosis Date  . Cancer   . Lung cancer   . Hypertension       ALLERGIES:  is allergic to codeine.  MEDICATIONS:  Current Outpatient Prescriptions  Medication Sig Dispense Refill  . ALPRAZolam (XANAX) 0.25 MG tablet Take 0.25 mg by mouth at bedtime as needed.      . Alum & Mag Hydroxide-Simeth (MAGIC MOUTHWASH) SOLN Take 5 mLs by mouth 4 (four) times daily as needed.  120 mL  0  . clindamycin (CLEOCIN-T) 1 % lotion Apply topically 2 (two) times daily.  60 mL  3  . dexamethasone (DECADRON) 4 MG tablet       . docusate sodium (COLACE) 100 MG capsule Take 100 mg by mouth 2 (two) times daily.        . folic acid (FOLVITE) 1 MG tablet Take 1 tablet (1 mg total) by mouth daily.  30 tablet  4  . lidocaine-prilocaine (EMLA) cream Apply topically as needed.        Marland Kitchen NEURONTIN 100 MG capsule TAKE (2) CAPSULES THREE TIMES DAILY.  180 each  1  . olmesartan (BENICAR) 20 MG tablet Take 20 mg by mouth daily.        Marland Kitchen oxyCODONE (OXYCONTIN) 20 MG 12 hr tablet Take 1 tablet (20 mg total) by mouth every 12 (twelve) hours.  60 tablet  0  . prochlorperazine (COMPAZINE) 10 MG tablet Take 1 tablet (10 mg total) by mouth every 6 (six) hours as needed.  60 tablet  0  . TANDEM 162-115.2 MG CAPS TAKE (1) CAPSULE AS DIR- ECTED.  30 each  2  . temazepam (RESTORIL)  15 MG capsule Take 1 capsule (15 mg total) by mouth at bedtime as needed.  30 capsule  0    REVIEW OF SYSTEMS:  A comprehensive review of systems was negative.   PHYSICAL EXAMINATION: General appearance: alert, cooperative and no distress Head: Normocephalic, without obvious abnormality, atraumatic Neck: no adenopathy Lymph nodes: Cervical, supraclavicular, and axillary nodes normal. Resp: clear to auscultation bilaterally Back: symmetric, no curvature. ROM normal. No CVA tenderness. Cardio: regular rate and rhythm, S1, S2 normal, no murmur, click, rub or gallop GI: soft, non-tender; bowel sounds normal; no masses,  no organomegaly Extremities: extremities normal, atraumatic, no cyanosis or edema Neurologic:  Alert and oriented X 3, normal strength and tone. Normal symmetric reflexes. Normal coordination and gait  ECOG PERFORMANCE STATUS: 1 - Symptomatic but completely ambulatory  Blood pressure 151/66, pulse 75, temperature 99.3 F (37.4 C), temperature source Oral, resp. rate 18, height 5\' 6"  (1.676 m), weight 139 lb 11.2 oz (63.368 kg).  LABORATORY DATA: Lab Results  Component Value Date   WBC 6.9 03/29/2012   HGB 8.7* 03/29/2012   HCT 26.0* 03/29/2012   MCV 94.9 03/29/2012   PLT 221 03/29/2012      Chemistry      Component Value Date/Time   NA 137 03/29/2012 1046   NA 139 01/25/2012 1048   NA 139 09/02/2011 1406   K 3.9 03/29/2012 1046   K 4.5 01/25/2012 1048   K 3.9 09/02/2011 1406   CL 106 03/29/2012 1046   CL 104 01/25/2012 1048   CL 97* 09/02/2011 1406   CO2 20* 03/29/2012 1046   CO2 28 01/25/2012 1048   CO2 29 09/02/2011 1406   BUN 18.0 03/29/2012 1046   BUN 18 01/25/2012 1048   BUN 18 09/02/2011 1406   CREATININE 0.8 03/29/2012 1046   CREATININE 0.92 01/25/2012 1048   CREATININE 1.2 09/02/2011 1406      Component Value Date/Time   CALCIUM 8.3* 03/29/2012 1046   CALCIUM 8.5 01/25/2012 1048   CALCIUM 8.0 09/02/2011 1406   ALKPHOS 79 03/29/2012 1046   ALKPHOS 72 01/25/2012 1048   ALKPHOS 71 09/02/2011 1406   AST 20 03/29/2012 1046   AST 19 01/25/2012 1048   AST 25 09/02/2011 1406   ALT 17 03/29/2012 1046   ALT 11 01/25/2012 1048   BILITOT 0.30 03/29/2012 1046   BILITOT 0.7 01/25/2012 1048   BILITOT 0.60 09/02/2011 1406       RADIOGRAPHIC STUDIES: Ct Chest W Contrast  02/24/2012  *RADIOLOGY REPORT*  Clinical Data:  Lung cancer restaging  CT CHEST, ABDOMEN AND PELVIS WITH CONTRAST  Technique:  Multidetector CT imaging of the chest, abdomen and pelvis was performed following the standard protocol during bolus administration of intravenous contrast.  Contrast: OMNIPAQUE IOHEXOL 300 MG/ML  SOLN  Comparison:  CT 11/25/2011   CT CHEST  Findings:  The bilobed lesion in the right upper  lobe is increased in size compared to prior.  The upper portion the lesion measures 18 mm in diameter (image #14) increased from 12 mm on prior.  The inferior portion measures 32 x 29 mm increased from 27 x 25 mm on prior.  No new pulmonary nodules on the left or right.  There is a port in the right anterior chest wall.  No axillary supraclavicular lymphadenopathy.  Right lower paratracheal lymph nodes measures 14 mm similar 14 mm on prior.  No pericardial fluid. Esophagus is gas filled.  IMPRESSION:  1.  Continued enlargement of the  right upper lobe mass. 2.  Right lower paratracheal lymph node is not changed in size but concerning for metastasis.   CT ABDOMEN AND PELVIS  Findings:  There is no focal hepatic lesion.  There is a biliary duct dilatation in the left greater than right hepatic lobe similar to the multiple comparison exams.  Pancreas is normal.  The spleen, adrenal glands, kidneys are unchanged.  The stomach, small bowel, colon are normal.  Abdominal aorta is normal caliber.  No retroperitoneal periportal lymphadenopathy.  No free fluid the pelvis.  No pelvic lymphadenopathy.  Post hysterectomy anatomy.  Stable metastasis within the manubrium and L1 vertebral body.  The chronic compression deformity at L3  IMPRESSION:  1.  No evidence metastasis in the soft tissue abdomen or  pelvis.  2.  Stable bone metastasis.   Original Report Authenticated By: Genevive Bi, M.D.      ASSESSMENT/PLAN: This is a very pleasant 78 years old white female with metastatic non-small cell lung cancer diagnosed in March of 2006 and has been on several treatment in the past including Tarceva for more than 6 years. Unfortunately the patient has evidence for disease progression especially in the chest.Tarceva therapy has been discontinued. She is now being treated with systemic chemotherapy in the form of Carboplatin for AUC of 5 and Alimta at 500 mg /m2 given every 3 weeks, Status post 1 cycle. The patient was discussed  with Dr. Arbutus Ped. She will proceed with cycle #2 of her systemic chemotherapy with carboplatin and Alimta. She will continue with weekly labs consisting of a CBC differential and CMET. She will return in 3 weeks with a repeat CBC differential and CMET prior to cycle # 3.She will continue on Zometa 4 mg IV every 3 months for her bone metastasis.  Jamie Munoz, Jamie Brau E, PA-C   All questions were answered. The patient knows to call the clinic with any problems, questions or concerns. We can certainly see the patient much sooner if necessary.  I spent 20 minutes counseling the patient face to face. The total time spent in the appointment was 30 minutes.

## 2012-04-12 ENCOUNTER — Telehealth: Payer: Self-pay | Admitting: Medical Oncology

## 2012-04-12 ENCOUNTER — Other Ambulatory Visit (HOSPITAL_BASED_OUTPATIENT_CLINIC_OR_DEPARTMENT_OTHER): Payer: Medicare Other | Admitting: Lab

## 2012-04-12 ENCOUNTER — Encounter (HOSPITAL_COMMUNITY)
Admission: RE | Admit: 2012-04-12 | Discharge: 2012-04-12 | Disposition: A | Discharge status: Home / Self Care | Payer: Medicare Other | Source: Ambulatory Visit | Attending: Internal Medicine | Admitting: Internal Medicine

## 2012-04-12 ENCOUNTER — Other Ambulatory Visit: Payer: Self-pay | Admitting: Medical Oncology

## 2012-04-12 ENCOUNTER — Other Ambulatory Visit: Payer: Medicare Other

## 2012-04-12 ENCOUNTER — Other Ambulatory Visit: Payer: Self-pay | Admitting: Oncology

## 2012-04-12 DIAGNOSIS — C3490 Malignant neoplasm of unspecified part of unspecified bronchus or lung: Secondary | ICD-10-CM

## 2012-04-12 DIAGNOSIS — D649 Anemia, unspecified: Secondary | ICD-10-CM

## 2012-04-12 LAB — CBC WITH DIFFERENTIAL/PLATELET
BASO%: 0 % (ref 0.0–2.0)
EOS%: 0.7 % (ref 0.0–7.0)
HCT: 22.4 % — ABNORMAL LOW (ref 34.8–46.6)
LYMPH%: 23.2 % (ref 14.0–49.7)
MCH: 31.8 pg (ref 25.1–34.0)
MCHC: 33 g/dL (ref 31.5–36.0)
MCV: 96.1 fL (ref 79.5–101.0)
MONO#: 0.7 10*3/uL (ref 0.1–0.9)
MONO%: 24.3 % — ABNORMAL HIGH (ref 0.0–14.0)
NEUT%: 51.8 % (ref 38.4–76.8)
Platelets: 63 10*3/uL — ABNORMAL LOW (ref 145–400)
RBC: 2.33 10*6/uL — ABNORMAL LOW (ref 3.70–5.45)
WBC: 2.8 10*3/uL — ABNORMAL LOW (ref 3.9–10.3)
nRBC: 0 % (ref 0–0)

## 2012-04-12 LAB — COMPREHENSIVE METABOLIC PANEL (CC13)
ALT: 15 U/L (ref 0–55)
AST: 20 U/L (ref 5–34)
BUN: 16 mg/dL (ref 7.0–26.0)
Calcium: 8.6 mg/dL (ref 8.4–10.4)
Chloride: 106 mEq/L (ref 98–107)
Creatinine: 0.7 mg/dL (ref 0.6–1.1)
Total Bilirubin: 0.3 mg/dL (ref 0.20–1.20)

## 2012-04-12 NOTE — Telephone Encounter (Signed)
Pt notified of blood transfusion appt for tomorrow and to keep blue armband on.

## 2012-04-12 NOTE — Telephone Encounter (Signed)
Pt symptomatic from anemia-orders entered for blood transfusion and pt notified

## 2012-04-12 NOTE — Telephone Encounter (Signed)
Per staff phone voicemail I have scheduled appt for tomorrow. Desk RN aware.  JMW

## 2012-04-13 ENCOUNTER — Ambulatory Visit (HOSPITAL_BASED_OUTPATIENT_CLINIC_OR_DEPARTMENT_OTHER): Payer: Medicare Other

## 2012-04-13 VITALS — BP 130/63 | HR 68 | Temp 98.5°F | Resp 18

## 2012-04-13 DIAGNOSIS — D649 Anemia, unspecified: Secondary | ICD-10-CM

## 2012-04-13 MED ORDER — DIPHENHYDRAMINE HCL 50 MG/ML IJ SOLN
25.0000 mg | Freq: Once | INTRAMUSCULAR | Status: AC
  Administered 2012-04-13: 25 mg via INTRAVENOUS

## 2012-04-13 MED ORDER — ACETAMINOPHEN 325 MG PO TABS
650.0000 mg | ORAL_TABLET | Freq: Once | ORAL | Status: AC
  Administered 2012-04-13: 650 mg via ORAL

## 2012-04-13 NOTE — Patient Instructions (Signed)
Blood Transfusion Information WHAT IS A BLOOD TRANSFUSION? A transfusion is the replacement of blood or some of its parts. Blood is made up of multiple cells which provide different functions.  Red blood cells carry oxygen and are used for blood loss replacement.  White blood cells fight against infection.  Platelets control bleeding.  Plasma helps clot blood.  Other blood products are available for specialized needs, such as hemophilia or other clotting disorders. BEFORE THE TRANSFUSION  Who gives blood for transfusions?   You may be able to donate blood to be used at a later date on yourself (autologous donation).  Relatives can be asked to donate blood. This is generally not any safer than if you have received blood from a stranger. The same precautions are taken to ensure safety when a relative's blood is donated.  Healthy volunteers who are fully evaluated to make sure their blood is safe. This is blood bank blood. Transfusion therapy is the safest it has ever been in the practice of medicine. Before blood is taken from a donor, a complete history is taken to make sure that person has no history of diseases nor engages in risky social behavior (examples are intravenous drug use or sexual activity with multiple partners). The donor's travel history is screened to minimize risk of transmitting infections, such as malaria. The donated blood is tested for signs of infectious diseases, such as HIV and hepatitis. The blood is then tested to be sure it is compatible with you in order to minimize the chance of a transfusion reaction. If you or a relative donates blood, this is often done in anticipation of surgery and is not appropriate for emergency situations. It takes many days to process the donated blood. RISKS AND COMPLICATIONS Although transfusion therapy is very safe and saves many lives, the main dangers of transfusion include:   Getting an infectious disease.  Developing a  transfusion reaction. This is an allergic reaction to something in the blood you were given. Every precaution is taken to prevent this. The decision to have a blood transfusion has been considered carefully by your caregiver before blood is given. Blood is not given unless the benefits outweigh the risks. AFTER THE TRANSFUSION  Right after receiving a blood transfusion, you will usually feel much better and more energetic. This is especially true if your red blood cells have gotten low (anemic). The transfusion raises the level of the red blood cells which carry oxygen, and this usually causes an energy increase.  The nurse administering the transfusion will monitor you carefully for complications. HOME CARE INSTRUCTIONS  No special instructions are needed after a transfusion. You may find your energy is better. Speak with your caregiver about any limitations on activity for underlying diseases you may have. SEEK MEDICAL CARE IF:   Your condition is not improving after your transfusion.  You develop redness or irritation at the intravenous (IV) site. SEEK IMMEDIATE MEDICAL CARE IF:  Any of the following symptoms occur over the next 12 hours:  Shaking chills.  You have a temperature by mouth above 102 F (38.9 C), not controlled by medicine.  Chest, back, or muscle pain.  People around you feel you are not acting correctly or are confused.  Shortness of breath or difficulty breathing.  Dizziness and fainting.  You get a rash or develop hives.  You have a decrease in urine output.  Your urine turns a dark color or changes to pink, red, or brown. Any of the following   symptoms occur over the next 10 days:  You have a temperature by mouth above 102 F (38.9 C), not controlled by medicine.  Shortness of breath.  Weakness after normal activity.  The white part of the eye turns yellow (jaundice).  You have a decrease in the amount of urine or are urinating less often.  Your  urine turns a dark color or changes to pink, red, or brown. Document Released: 06/03/2000 Document Revised: 08/29/2011 Document Reviewed: 01/21/2008 [employer] Patient Information 2013 [employer].  

## 2012-04-14 LAB — TYPE AND SCREEN: Unit division: 0

## 2012-04-19 ENCOUNTER — Ambulatory Visit (HOSPITAL_BASED_OUTPATIENT_CLINIC_OR_DEPARTMENT_OTHER): Payer: Medicare Other

## 2012-04-19 ENCOUNTER — Telehealth: Payer: Self-pay | Admitting: Internal Medicine

## 2012-04-19 ENCOUNTER — Ambulatory Visit: Payer: Medicare Other | Admitting: Physician Assistant

## 2012-04-19 ENCOUNTER — Telehealth: Payer: Self-pay | Admitting: *Deleted

## 2012-04-19 ENCOUNTER — Other Ambulatory Visit (HOSPITAL_BASED_OUTPATIENT_CLINIC_OR_DEPARTMENT_OTHER): Payer: Medicare Other | Admitting: Lab

## 2012-04-19 ENCOUNTER — Encounter: Payer: Self-pay | Admitting: Physician Assistant

## 2012-04-19 VITALS — BP 144/70 | HR 71 | Temp 97.4°F | Resp 20 | Ht 66.0 in | Wt 136.6 lb

## 2012-04-19 DIAGNOSIS — Z23 Encounter for immunization: Secondary | ICD-10-CM

## 2012-04-19 DIAGNOSIS — C7952 Secondary malignant neoplasm of bone marrow: Secondary | ICD-10-CM

## 2012-04-19 DIAGNOSIS — C3490 Malignant neoplasm of unspecified part of unspecified bronchus or lung: Secondary | ICD-10-CM

## 2012-04-19 DIAGNOSIS — C7951 Secondary malignant neoplasm of bone: Secondary | ICD-10-CM

## 2012-04-19 DIAGNOSIS — C3410 Malignant neoplasm of upper lobe, unspecified bronchus or lung: Secondary | ICD-10-CM

## 2012-04-19 DIAGNOSIS — Z5111 Encounter for antineoplastic chemotherapy: Secondary | ICD-10-CM

## 2012-04-19 LAB — COMPREHENSIVE METABOLIC PANEL (CC13)
ALT: 15 U/L (ref 0–55)
AST: 20 U/L (ref 5–34)
BUN: 18 mg/dL (ref 7.0–26.0)
CO2: 25 mEq/L (ref 22–29)
Calcium: 8.8 mg/dL (ref 8.4–10.4)
Chloride: 105 mEq/L (ref 98–107)
Creatinine: 0.8 mg/dL (ref 0.6–1.1)
Total Bilirubin: 0.57 mg/dL (ref 0.20–1.20)

## 2012-04-19 LAB — CBC WITH DIFFERENTIAL/PLATELET
BASO%: 0 % (ref 0.0–2.0)
HCT: 31.7 % — ABNORMAL LOW (ref 34.8–46.6)
LYMPH%: 7.3 % — ABNORMAL LOW (ref 14.0–49.7)
MCHC: 33.1 g/dL (ref 31.5–36.0)
MCV: 91.1 fL (ref 79.5–101.0)
MONO%: 13 % (ref 0.0–14.0)
NEUT%: 79.7 % — ABNORMAL HIGH (ref 38.4–76.8)
Platelets: 120 10*3/uL — ABNORMAL LOW (ref 145–400)
RBC: 3.48 10*6/uL — ABNORMAL LOW (ref 3.70–5.45)
nRBC: 0 % (ref 0–0)

## 2012-04-19 MED ORDER — OXYCODONE HCL 20 MG PO TB12: 20.0000 mg | ORAL_TABLET | Freq: Two times a day (BID) | ORAL | Status: DC

## 2012-04-19 MED ORDER — CARBOPLATIN CHEMO INTRADERMAL TEST DOSE 100MCG/0.02ML
100.0000 ug | Freq: Once | INTRADERMAL | Status: AC
  Administered 2012-04-19: 100 ug via INTRADERMAL
  Filled 2012-04-19: qty 0.01

## 2012-04-19 MED ORDER — DEXAMETHASONE SODIUM PHOSPHATE 4 MG/ML IJ SOLN
20.0000 mg | Freq: Once | INTRAMUSCULAR | Status: AC
  Administered 2012-04-19: 20 mg via INTRAVENOUS

## 2012-04-19 MED ORDER — SODIUM CHLORIDE 0.9 % IV SOLN
360.0000 mg | Freq: Once | INTRAVENOUS | Status: AC
  Administered 2012-04-19: 360 mg via INTRAVENOUS
  Filled 2012-04-19: qty 36

## 2012-04-19 MED ORDER — SODIUM CHLORIDE 0.9 % IV SOLN
500.0000 mg/m2 | Freq: Once | INTRAVENOUS | Status: AC
  Administered 2012-04-19: 850 mg via INTRAVENOUS
  Filled 2012-04-19: qty 34

## 2012-04-19 MED ORDER — INFLUENZA VIRUS VACC SPLIT PF IM SUSP
0.5000 mL | Freq: Once | INTRAMUSCULAR | Status: AC
  Filled 2012-04-19: qty 0.5

## 2012-04-19 MED ORDER — HEPARIN SOD (PORK) LOCK FLUSH 100 UNIT/ML IV SOLN
500.0000 [IU] | Freq: Once | INTRAVENOUS | Status: AC | PRN
  Administered 2012-04-19: 500 [IU]
  Filled 2012-04-19: qty 5

## 2012-04-19 MED ORDER — SODIUM CHLORIDE 0.9 % IJ SOLN
10.0000 mL | INTRAMUSCULAR | Status: DC | PRN
  Administered 2012-04-19: 10 mL
  Filled 2012-04-19: qty 10

## 2012-04-19 MED ORDER — INFLUENZA VIRUS VACC SPLIT PF IM SUSP
0.5000 mL | Freq: Once | INTRAMUSCULAR | Status: AC
  Administered 2012-04-19: 0.5 mL via INTRAMUSCULAR
  Filled 2012-04-19: qty 0.5

## 2012-04-19 MED ORDER — SODIUM CHLORIDE 0.9 % IV SOLN: Freq: Once | INTRAVENOUS | Status: DC

## 2012-04-19 MED ORDER — ZOLEDRONIC ACID 4 MG/5ML IV CONC: 4.0000 mg | Freq: Once | INTRAVENOUS | Status: DC

## 2012-04-19 MED ORDER — CYANOCOBALAMIN 1000 MCG/ML IJ SOLN
1000.0000 ug | Freq: Once | INTRAMUSCULAR | Status: AC
  Administered 2012-04-19: 1000 ug via INTRAMUSCULAR

## 2012-04-19 MED ORDER — SODIUM CHLORIDE 0.9 % IJ SOLN: 100.0000 ug | Freq: Once | INTRAVENOUS | Status: DC

## 2012-04-19 MED ORDER — ONDANSETRON 16 MG/50ML IVPB (CHCC)
16.0000 mg | Freq: Once | INTRAVENOUS | Status: AC
  Administered 2012-04-19: 16 mg via INTRAVENOUS

## 2012-04-19 NOTE — Patient Instructions (Addendum)
Continue weekly labs Follow up with Dr. Arbutus Ped in 3 weeks with a restaging Ct scan of your chest, abdomen and  Pelvis to re-evaluate your disease

## 2012-04-19 NOTE — Progress Notes (Unsigned)
[facility] [phone]   [phone]  OFFICE PROGRESS NOTE  Jamie Hoit, MD [address] [place] [address]  DIAGNOSIS: Metastatic non-small cell lung cancer (T2 NX M1). Presented with right upper lobe lung mass as well as multiple pulmonary nodules and bone metastasis at L3 vertebral body. This was diagnosed in March of 2006.   PRIOR THERAPY:  1. Status post palliative radiotherapy to the L3 lesion under the care of Dr. Kathrynn Running completed September 17, 2004. 2. Status post 6 cycles of systemic chemotherapy with carboplatin and paclitaxel. Last dose was given January 01, 2005. 3. Status post palliative radiotherapy to the right upper lobe lung mass under the care of Dr. Kathrynn Running completed on 12/29/2010. 4. Status post treatment with Tarceva 150 mg p.o. daily started April 2007 through July 2012. 5. Tarceva 100 mg p.o. daily started July 2012.  CURRENT THERAPY:  1. Systemic chemotherapy with Carboplatin for AUC 5 and Alimta 500 mg/m2 given every 3 weeks. Status post 2 cycles. 2.  Zometa 4 mg IV q. 3 months for bone metastasis  INTERVAL HISTORY: Jamie Munoz 78 y.o. female returns to the clinic today for followup visit. She tolerated her first 2 cycles of systemic chemotherapy with carboplatin and Alimta relatively well. She reports a significant problem with constipation to the point (should. She has a friend who is a Designer, jewellery who helped her with her impaction. She is now taking Dulcolax as well as for stool softeners daily, increasing her water intake and using Benefiber twice daily. She requests a flu vaccine today. She voiced no other complaints today. She requests a refill for her OxyContin today. She denied having any significant chest pain or shortness breath, no cough or hemoptysis. She denied having any significant weight loss or night sweats.   MEDICAL HISTORY: Past Medical History  Diagnosis Date  . Cancer   . Lung cancer   .  Hypertension     ALLERGIES:  is allergic to codeine.  MEDICATIONS:  Current Outpatient Prescriptions  Medication Sig Dispense Refill  . ALPRAZolam (XANAX) 0.25 MG tablet Take 0.25 mg by mouth at bedtime as needed.      . Alum & Mag Hydroxide-Simeth (MAGIC MOUTHWASH) SOLN Take 5 mLs by mouth 4 (four) times daily as needed.  120 mL  0  . clindamycin (CLEOCIN-T) 1 % lotion Apply topically 2 (two) times daily.  60 mL  3  . dexamethasone (DECADRON) 4 MG tablet       . docusate sodium (COLACE) 100 MG capsule Take 100 mg by mouth 2 (two) times daily.        . folic acid (FOLVITE) 1 MG tablet Take 1 tablet (1 mg total) by mouth daily.  30 tablet  4  . lidocaine-prilocaine (EMLA) cream Apply topically as needed.        Marland Kitchen NEURONTIN 100 MG capsule TAKE (2) CAPSULES THREE TIMES DAILY.  180 each  1  . olmesartan (BENICAR) 20 MG tablet Take 20 mg by mouth daily.        Marland Kitchen oxyCODONE (OXYCONTIN) 20 MG 12 hr tablet Take 1 tablet (20 mg total) by mouth every 12 (twelve) hours.  60 tablet  0  . prochlorperazine (COMPAZINE) 10 MG tablet Take 1 tablet (10 mg total) by mouth every 6 (six) hours as needed.  60 tablet  0  . TANDEM 162-115.2 MG CAPS TAKE (1) CAPSULE AS DIR- ECTED.  30 each  2  .  temazepam (RESTORIL) 15 MG capsule Take 1 capsule (15 mg total) by mouth at bedtime as needed.  30 capsule  0   Current Facility-Administered Medications  Medication Dose Route Frequency Provider Last Rate Last Dose  . influenza  inactive virus vaccine (FLUZONE/FLUARIX) injection 0.5 mL  0.5 mL Intramuscular Once Si Gaul, MD        REVIEW OF SYSTEMS:  A comprehensive review of systems was negative except for: Gastrointestinal: positive for constipation   PHYSICAL EXAMINATION: General appearance: alert, cooperative and no distress Head: Normocephalic, without obvious abnormality, atraumatic Neck: no adenopathy Lymph nodes: Cervical, supraclavicular, and axillary nodes normal. Resp: clear to auscultation  bilaterally Back: symmetric, no curvature. ROM normal. No CVA tenderness. Cardio: regular rate and rhythm, S1, S2 normal, no murmur, click, rub or gallop GI: soft, non-tender; bowel sounds normal; no masses,  no organomegaly Extremities: extremities normal, atraumatic, no cyanosis or edema Neurologic: Alert and oriented X 3, normal strength and tone. Normal symmetric reflexes. Normal coordination and gait  ECOG PERFORMANCE STATUS: 1 - Symptomatic but completely ambulatory  Blood pressure 144/70, pulse 71, temperature 97.4 F (36.3 C), temperature source Oral, resp. rate 20, height 5\' 6"  (1.676 m), weight 136 lb 9.6 oz (61.961 kg).  LABORATORY DATA: Lab Results  Component Value Date   WBC 6.7 04/19/2012   HGB 10.5* 04/19/2012   HCT 31.7* 04/19/2012   MCV 91.1 04/19/2012   PLT 120* 04/19/2012      Chemistry      Component Value Date/Time   NA 140 04/12/2012 1011   NA 139 01/25/2012 1048   NA 139 09/02/2011 1406   K 4.0 04/12/2012 1011   K 4.5 01/25/2012 1048   K 3.9 09/02/2011 1406   CL 106 04/12/2012 1011   CL 104 01/25/2012 1048   CL 97* 09/02/2011 1406   CO2 24 04/12/2012 1011   CO2 28 01/25/2012 1048   CO2 29 09/02/2011 1406   BUN 16.0 04/12/2012 1011   BUN 18 01/25/2012 1048   BUN 18 09/02/2011 1406   CREATININE 0.7 04/12/2012 1011   CREATININE 0.92 01/25/2012 1048   CREATININE 1.2 09/02/2011 1406      Component Value Date/Time   CALCIUM 8.6 04/12/2012 1011   CALCIUM 8.5 01/25/2012 1048   CALCIUM 8.0 09/02/2011 1406   ALKPHOS 64 04/12/2012 1011   ALKPHOS 72 01/25/2012 1048   ALKPHOS 71 09/02/2011 1406   AST 20 04/12/2012 1011   AST 19 01/25/2012 1048   AST 25 09/02/2011 1406   ALT 15 04/12/2012 1011   ALT 11 01/25/2012 1048   BILITOT 0.30 04/12/2012 1011   BILITOT 0.7 01/25/2012 1048   BILITOT 0.60 09/02/2011 1406       RADIOGRAPHIC STUDIES: Ct Chest W Contrast  02/24/2012  *RADIOLOGY REPORT*  Clinical Data:  Lung cancer restaging  CT CHEST, ABDOMEN AND PELVIS WITH CONTRAST   Technique:  Multidetector CT imaging of the chest, abdomen and pelvis was performed following the standard protocol during bolus administration of intravenous contrast.  Contrast: OMNIPAQUE IOHEXOL 300 MG/ML  SOLN  Comparison:  CT 11/25/2011   CT CHEST  Findings:  The bilobed lesion in the right upper lobe is increased in size compared to prior.  The upper portion the lesion measures 18 mm in diameter (image #14) increased from 12 mm on prior.  The inferior portion measures 32 x 29 mm increased from 27 x 25 mm on prior.  No new pulmonary nodules on the left or right.  There is a port in the right anterior chest wall.  No axillary supraclavicular lymphadenopathy.  Right lower paratracheal lymph nodes measures 14 mm similar 14 mm on prior.  No pericardial fluid. Esophagus is gas filled.  IMPRESSION:  1.  Continued enlargement of the right upper lobe mass. 2.  Right lower paratracheal lymph node is not changed in size but concerning for metastasis.   CT ABDOMEN AND PELVIS  Findings:  There is no focal hepatic lesion.  There is a biliary duct dilatation in the left greater than right hepatic lobe similar to the multiple comparison exams.  Pancreas is normal.  The spleen, adrenal glands, kidneys are unchanged.  The stomach, small bowel, colon are normal.  Abdominal aorta is normal caliber.  No retroperitoneal periportal lymphadenopathy.  No free fluid the pelvis.  No pelvic lymphadenopathy.  Post hysterectomy anatomy.  Stable metastasis within the manubrium and L1 vertebral body.  The chronic compression deformity at L3  IMPRESSION:  1.  No evidence metastasis in the soft tissue abdomen or  pelvis.  2.  Stable bone metastasis.   Original Report Authenticated By: Genevive Bi, M.D.      ASSESSMENT/PLAN: This is a very pleasant 78 years old white female with metastatic non-small cell lung cancer diagnosed in March of 2006 and has been on several treatment in the past including Tarceva for more than 6 years.  Unfortunately the patient has evidence for disease progression especially in the chest.Tarceva therapy has been discontinued. She is now being treated with systemic chemotherapy in the form of Carboplatin for AUC of 5 and Alimta at 500 mg /m2 given every 3 weeks, Status post 2 cycles. The patient was discussed with Dr. Arbutus Ped. She will proceed with cycle #3 of her systemic chemotherapy with carboplatin and Alimta. She will continue with weekly labs consisting of a CBC differential and CMET. She'll followup with Dr. Arbutus Ped in 3 weeks with repeat CBC differential, C. met and CT of the chest abdomen and pelvis with contrast to reevaluate her disease. She'll receive her flu vaccine today as requested. She is given a prescription for her OxyContin 20 mg tablets a total of 60 with no refill. She will continue on Zometa 4 mg IV every 3 months for her bone metastasis.  Jamie Munoz, Alda Gaultney E, PA-C   All questions were answered. The patient knows to call the clinic with any problems, questions or concerns. We can certainly see the patient much sooner if necessary.  I spent 20 minutes counseling the patient face to face. The total time spent in the appointment was 30 minutes.

## 2012-04-19 NOTE — Progress Notes (Signed)
Carbo skin test.  Administered 1143.  5 minutes/1148 - negative.  15 minutes/1158 -     , 30 minutes/1203-

## 2012-04-19 NOTE — Telephone Encounter (Signed)
gv and printed pt appt schedule for NOV and DEC

## 2012-04-19 NOTE — Telephone Encounter (Signed)
Per staff phone call and POF I have scheduled appt. JMW  

## 2012-04-23 ENCOUNTER — Other Ambulatory Visit: Payer: Self-pay | Admitting: Internal Medicine

## 2012-04-23 DIAGNOSIS — C3490 Malignant neoplasm of unspecified part of unspecified bronchus or lung: Secondary | ICD-10-CM

## 2012-04-26 ENCOUNTER — Other Ambulatory Visit (HOSPITAL_BASED_OUTPATIENT_CLINIC_OR_DEPARTMENT_OTHER): Payer: Medicare Other | Admitting: Lab

## 2012-04-26 DIAGNOSIS — C3490 Malignant neoplasm of unspecified part of unspecified bronchus or lung: Secondary | ICD-10-CM

## 2012-04-26 LAB — CBC WITH DIFFERENTIAL/PLATELET
BASO%: 0.7 % (ref 0.0–2.0)
Basophils Absolute: 0 10*3/uL (ref 0.0–0.1)
HCT: 28.6 % — ABNORMAL LOW (ref 34.8–46.6)
HGB: 9.9 g/dL — ABNORMAL LOW (ref 11.6–15.9)
LYMPH%: 28.9 % (ref 14.0–49.7)
MCHC: 34.7 g/dL (ref 31.5–36.0)
MONO#: 0.2 10*3/uL (ref 0.1–0.9)
NEUT%: 53.2 % (ref 38.4–76.8)
Platelets: 128 10*3/uL — ABNORMAL LOW (ref 145–400)
WBC: 1.2 10*3/uL — ABNORMAL LOW (ref 3.9–10.3)
lymph#: 0.4 10*3/uL — ABNORMAL LOW (ref 0.9–3.3)

## 2012-04-26 LAB — COMPREHENSIVE METABOLIC PANEL (CC13)
ALT: 17 U/L (ref 0–55)
BUN: 18 mg/dL (ref 7.0–26.0)
CO2: 26 mEq/L (ref 22–29)
Calcium: 8 mg/dL — ABNORMAL LOW (ref 8.4–10.4)
Chloride: 106 mEq/L (ref 98–107)
Creatinine: 0.8 mg/dL (ref 0.6–1.1)
Glucose: 118 mg/dl — ABNORMAL HIGH (ref 70–99)
Total Bilirubin: 0.71 mg/dL (ref 0.20–1.20)

## 2012-05-03 ENCOUNTER — Other Ambulatory Visit (HOSPITAL_BASED_OUTPATIENT_CLINIC_OR_DEPARTMENT_OTHER): Payer: Medicare Other | Admitting: Lab

## 2012-05-03 DIAGNOSIS — C3490 Malignant neoplasm of unspecified part of unspecified bronchus or lung: Secondary | ICD-10-CM

## 2012-05-03 LAB — CBC WITH DIFFERENTIAL/PLATELET
BASO%: 0 % (ref 0.0–2.0)
Eosinophils Absolute: 0 10*3/uL (ref 0.0–0.5)
HCT: 26 % — ABNORMAL LOW (ref 34.8–46.6)
HGB: 8.6 g/dL — ABNORMAL LOW (ref 11.6–15.9)
LYMPH%: 22.8 % (ref 14.0–49.7)
MCHC: 33.1 g/dL (ref 31.5–36.0)
MONO#: 0.6 10*3/uL (ref 0.1–0.9)
NEUT#: 1.6 10*3/uL (ref 1.5–6.5)
NEUT%: 53.7 % (ref 38.4–76.8)
Platelets: 68 10*3/uL — ABNORMAL LOW (ref 145–400)
WBC: 2.9 10*3/uL — ABNORMAL LOW (ref 3.9–10.3)
lymph#: 0.7 10*3/uL — ABNORMAL LOW (ref 0.9–3.3)

## 2012-05-03 LAB — COMPREHENSIVE METABOLIC PANEL (CC13)
ALT: 14 U/L (ref 0–55)
CO2: 27 mEq/L (ref 22–29)
Calcium: 8.3 mg/dL — ABNORMAL LOW (ref 8.4–10.4)
Chloride: 107 mEq/L (ref 98–107)
Creatinine: 0.9 mg/dL (ref 0.6–1.1)
Glucose: 116 mg/dl — ABNORMAL HIGH (ref 70–99)
Total Protein: 5.9 g/dL — ABNORMAL LOW (ref 6.4–8.3)

## 2012-05-07 ENCOUNTER — Ambulatory Visit (HOSPITAL_COMMUNITY)
Admission: RE | Admit: 2012-05-07 | Discharge: 2012-05-07 | Disposition: A | Discharge status: Home / Self Care | Payer: Medicare Other | Source: Ambulatory Visit | Attending: Physician Assistant | Admitting: Physician Assistant

## 2012-05-07 DIAGNOSIS — C3490 Malignant neoplasm of unspecified part of unspecified bronchus or lung: Secondary | ICD-10-CM | POA: Insufficient documentation

## 2012-05-07 DIAGNOSIS — C7951 Secondary malignant neoplasm of bone: Secondary | ICD-10-CM | POA: Insufficient documentation

## 2012-05-07 DIAGNOSIS — Z923 Personal history of irradiation: Secondary | ICD-10-CM | POA: Insufficient documentation

## 2012-05-07 MED ORDER — IOHEXOL 300 MG/ML  SOLN
100.0000 mL | Freq: Once | INTRAMUSCULAR | Status: AC | PRN
  Administered 2012-05-07: 100 mL via INTRAVENOUS

## 2012-05-10 ENCOUNTER — Telehealth: Payer: Self-pay | Admitting: Internal Medicine

## 2012-05-10 ENCOUNTER — Other Ambulatory Visit (HOSPITAL_BASED_OUTPATIENT_CLINIC_OR_DEPARTMENT_OTHER): Payer: Medicare Other | Admitting: Lab

## 2012-05-10 ENCOUNTER — Ambulatory Visit (HOSPITAL_BASED_OUTPATIENT_CLINIC_OR_DEPARTMENT_OTHER): Payer: Medicare Other | Admitting: Internal Medicine

## 2012-05-10 ENCOUNTER — Ambulatory Visit (HOSPITAL_BASED_OUTPATIENT_CLINIC_OR_DEPARTMENT_OTHER): Payer: Medicare Other

## 2012-05-10 VITALS — BP 153/67 | HR 76 | Temp 98.1°F | Resp 18 | Ht 66.0 in | Wt 136.9 lb

## 2012-05-10 DIAGNOSIS — C7952 Secondary malignant neoplasm of bone marrow: Secondary | ICD-10-CM

## 2012-05-10 DIAGNOSIS — C3410 Malignant neoplasm of upper lobe, unspecified bronchus or lung: Secondary | ICD-10-CM

## 2012-05-10 DIAGNOSIS — D6481 Anemia due to antineoplastic chemotherapy: Secondary | ICD-10-CM

## 2012-05-10 DIAGNOSIS — C7951 Secondary malignant neoplasm of bone: Secondary | ICD-10-CM

## 2012-05-10 DIAGNOSIS — Z5111 Encounter for antineoplastic chemotherapy: Secondary | ICD-10-CM

## 2012-05-10 DIAGNOSIS — C3490 Malignant neoplasm of unspecified part of unspecified bronchus or lung: Secondary | ICD-10-CM

## 2012-05-10 LAB — CBC WITH DIFFERENTIAL/PLATELET
BASO%: 0.2 % (ref 0.0–2.0)
Eosinophils Absolute: 0 10*3/uL (ref 0.0–0.5)
HCT: 27.1 % — ABNORMAL LOW (ref 34.8–46.6)
MCHC: 33.2 g/dL (ref 31.5–36.0)
MONO#: 0.6 10*3/uL (ref 0.1–0.9)
NEUT#: 5.4 10*3/uL (ref 1.5–6.5)
RBC: 2.85 10*6/uL — ABNORMAL LOW (ref 3.70–5.45)
WBC: 6.5 10*3/uL (ref 3.9–10.3)
lymph#: 0.5 10*3/uL — ABNORMAL LOW (ref 0.9–3.3)
nRBC: 0 % (ref 0–0)

## 2012-05-10 LAB — COMPREHENSIVE METABOLIC PANEL (CC13)
ALT: 15 U/L (ref 0–55)
AST: 21 U/L (ref 5–34)
Albumin: 3.6 g/dL (ref 3.5–5.0)
Calcium: 8.4 mg/dL (ref 8.4–10.4)
Chloride: 109 mEq/L — ABNORMAL HIGH (ref 98–107)
Potassium: 3.7 mEq/L (ref 3.5–5.1)
Total Protein: 6.6 g/dL (ref 6.4–8.3)

## 2012-05-10 MED ORDER — SODIUM CHLORIDE 0.9 % IJ SOLN
100.0000 ug | Freq: Once | INTRAVENOUS | Status: AC
  Administered 2012-05-10: 10 mg via INTRADERMAL
  Filled 2012-05-10: qty 1

## 2012-05-10 MED ORDER — ONDANSETRON 16 MG/50ML IVPB (CHCC)
16.0000 mg | Freq: Once | INTRAVENOUS | Status: AC
  Administered 2012-05-10: 16 mg via INTRAVENOUS

## 2012-05-10 MED ORDER — SODIUM CHLORIDE 0.9 % IV SOLN
500.0000 mg/m2 | Freq: Once | INTRAVENOUS | Status: AC
  Administered 2012-05-10: 850 mg via INTRAVENOUS
  Filled 2012-05-10: qty 34

## 2012-05-10 MED ORDER — HEPARIN SOD (PORK) LOCK FLUSH 100 UNIT/ML IV SOLN
500.0000 [IU] | Freq: Once | INTRAVENOUS | Status: AC | PRN
  Administered 2012-05-10: 500 [IU]
  Filled 2012-05-10: qty 5

## 2012-05-10 MED ORDER — DEXAMETHASONE SODIUM PHOSPHATE 4 MG/ML IJ SOLN
20.0000 mg | Freq: Once | INTRAMUSCULAR | Status: AC
  Administered 2012-05-10: 20 mg via INTRAVENOUS

## 2012-05-10 MED ORDER — SODIUM CHLORIDE 0.9 % IV SOLN
321.3000 mg | Freq: Once | INTRAVENOUS | Status: AC
  Administered 2012-05-10: 320 mg via INTRAVENOUS
  Filled 2012-05-10: qty 32

## 2012-05-10 MED ORDER — SODIUM CHLORIDE 0.9 % IV SOLN
Freq: Once | INTRAVENOUS | Status: AC
  Administered 2012-05-10: 12:00:00 via INTRAVENOUS

## 2012-05-10 MED ORDER — SODIUM CHLORIDE 0.9 % IJ SOLN
10.0000 mL | INTRAMUSCULAR | Status: DC | PRN
  Administered 2012-05-10: 10 mL
  Filled 2012-05-10: qty 10

## 2012-05-10 NOTE — Patient Instructions (Addendum)
Carboplatin injection What is this medicine? CARBOPLATIN (KAR boe pla tin) is a chemotherapy drug. It targets fast dividing cells, like cancer cells, and causes these cells to die. This medicine is used to treat ovarian cancer and many other cancers. This medicine may be used for other purposes; ask your health care provider or pharmacist if you have questions. What should I tell my health care provider before I take this medicine? They need to know if you have any of these conditions: -blood disorders -hearing problems -kidney disease -recent or ongoing radiation therapy -an unusual or allergic reaction to carboplatin, cisplatin, other chemotherapy, other medicines, foods, dyes, or preservatives -pregnant or trying to get pregnant -breast-feeding How should I use this medicine? This drug is usually given as an infusion into a vein. It is administered in a hospital or clinic by a specially trained health care professional. Talk to your pediatrician regarding the use of this medicine in children. Special care may be needed. Overdosage: If you think you have taken too much of this medicine contact a poison control center or emergency room at once. NOTE: This medicine is only for you. Do not share this medicine with others. What if I miss a dose? It is important not to miss a dose. Call your doctor or health care professional if you are unable to keep an appointment. What may interact with this medicine? -medicines for seizures -medicines to increase blood counts like filgrastim, pegfilgrastim, sargramostim -some antibiotics like amikacin, gentamicin, neomycin, streptomycin, tobramycin -vaccines Talk to your doctor or health care professional before taking any of these medicines: -acetaminophen -aspirin -ibuprofen -ketoprofen -naproxen This list may not describe all possible interactions. Give your health care provider a list of all the medicines, herbs, non-prescription drugs, or dietary  supplements you use. Also tell them if you smoke, drink alcohol, or use illegal drugs. Some items may interact with your medicine. What should I watch for while using this medicine? Your condition will be monitored carefully while you are receiving this medicine. You will need important blood work done while you are taking this medicine. This drug may make you feel generally unwell. This is not uncommon, as chemotherapy can affect healthy cells as well as cancer cells. Report any side effects. Continue your course of treatment even though you feel ill unless your doctor tells you to stop. In some cases, you may be given additional medicines to help with side effects. Follow all directions for their use. Call your doctor or health care professional for advice if you get a fever, chills or sore throat, or other symptoms of a cold or flu. Do not treat yourself. This drug decreases your body's ability to fight infections. Try to avoid being around people who are sick. This medicine may increase your risk to bruise or bleed. Call your doctor or health care professional if you notice any unusual bleeding. Be careful brushing and flossing your teeth or using a toothpick because you may get an infection or bleed more easily. If you have any dental work done, tell your dentist you are receiving this medicine. Avoid taking products that contain aspirin, acetaminophen, ibuprofen, naproxen, or ketoprofen unless instructed by your doctor. These medicines may hide a fever. Do not become pregnant while taking this medicine. Women should inform their doctor if they wish to become pregnant or think they might be pregnant. There is a potential for serious side effects to an unborn child. Talk to your health care professional or pharmacist for more information.   Do not breast-feed an infant while taking this medicine. What side effects may I notice from receiving this medicine? Side effects that you should report to your  doctor or health care professional as soon as possible: -allergic reactions like skin rash, itching or hives, swelling of the face, lips, or tongue -signs of infection - fever or chills, cough, sore throat, pain or difficulty passing urine -signs of decreased platelets or bleeding - bruising, pinpoint red spots on the skin, black, tarry stools, nosebleeds -signs of decreased red blood cells - unusually weak or tired, fainting spells, lightheadedness -breathing problems -changes in hearing -changes in vision -chest pain -high blood pressure -low blood counts - This drug may decrease the number of white blood cells, red blood cells and platelets. You may be at increased risk for infections and bleeding. -nausea and vomiting -pain, swelling, redness or irritation at the injection site -pain, tingling, numbness in the hands or feet -problems with balance, talking, walking -trouble passing urine or change in the amount of urine Side effects that usually do not require medical attention (report to your doctor or health care professional if they continue or are bothersome): -hair loss -loss of appetite -metallic taste in the mouth or changes in taste This list may not describe all possible side effects. Call your doctor for medical advice about side effects. You may report side effects to FDA at 1-800-FDA-1088. Where should I keep my medicine? This drug is given in a hospital or clinic and will not be stored at home. NOTE: This sheet is a summary. It may not cover all possible information. If you have questions about this medicine, talk to your doctor, pharmacist, or health care provider.  2012, Elsevier/Gold Standard. (09/11/2007 2:38:05 PM)Pemetrexed injection What is this medicine? PEMETREXED (PEM e TREX ed) is a chemotherapy drug. This medicine affects cells that are rapidly growing, such as cancer cells and cells in your mouth and stomach. It is usually used to treat lung cancers like  non-small cell lung cancer and mesothelioma. It may also be used to treat other cancers. This medicine may be used for other purposes; ask your health care provider or pharmacist if you have questions. What should I tell my health care provider before I take this medicine? They need to know if you have any of these conditions: -if you frequently drink alcohol containing beverages -infection (especially a virus infection such as chickenpox, cold sores, or herpes) -kidney disease -liver disease -low blood counts, like low platelets, red bloods, or white blood cells -an unusual or allergic reaction to pemetrexed, mannitol, other medicines, foods, dyes, or preservatives -pregnant or trying to get pregnant -breast-feeding How should I use this medicine? This drug is given as an infusion into a vein. It is administered in a hospital or clinic by a specially trained health care professional. Talk to your pediatrician regarding the use of this medicine in children. Special care may be needed. Overdosage: If you think you have taken too much of this medicine contact a poison control center or emergency room at once. NOTE: This medicine is only for you. Do not share this medicine with others. What if I miss a dose? It is important not to miss your dose. Call your doctor or health care professional if you are unable to keep an appointment. What may interact with this medicine? -aspirin and aspirin-like medicines -medicines to increase blood counts like filgrastim, pegfilgrastim, sargramostim -methotrexate -NSAIDS, medicines for pain and inflammation, like ibuprofen or naproxen -probenecid -pyrimethamine -  vaccines Talk to your doctor or health care professional before taking any of these medicines: -acetaminophen -aspirin -ibuprofen -ketoprofen -naproxen This list may not describe all possible interactions. Give your health care provider a list of all the medicines, herbs, non-prescription  drugs, or dietary supplements you use. Also tell them if you smoke, drink alcohol, or use illegal drugs. Some items may interact with your medicine. What should I watch for while using this medicine? Visit your doctor for checks on your progress. This drug may make you feel generally unwell. This is not uncommon, as chemotherapy can affect healthy cells as well as cancer cells. Report any side effects. Continue your course of treatment even though you feel ill unless your doctor tells you to stop. In some cases, you may be given additional medicines to help with side effects. Follow all directions for their use. Call your doctor or health care professional for advice if you get a fever, chills or sore throat, or other symptoms of a cold or flu. Do not treat yourself. This drug decreases your body's ability to fight infections. Try to avoid being around people who are sick. This medicine may increase your risk to bruise or bleed. Call your doctor or health care professional if you notice any unusual bleeding. Be careful brushing and flossing your teeth or using a toothpick because you may get an infection or bleed more easily. If you have any dental work done, tell your dentist you are receiving this medicine. Avoid taking products that contain aspirin, acetaminophen, ibuprofen, naproxen, or ketoprofen unless instructed by your doctor. These medicines may hide a fever. Call your doctor or health care professional if you get diarrhea or mouth sores. Do not treat yourself. To protect your kidneys, drink water or other fluids as directed while you are taking this medicine. Men and women must use effective birth control while taking this medicine. You may also need to continue using effective birth control for a time after stopping this medicine. Do not become pregnant while taking this medicine. Tell your doctor right away if you think that you or your partner might be pregnant. There is a potential for  serious side effects to an unborn child. Talk to your health care professional or pharmacist for more information. Do not breast-feed an infant while taking this medicine. This medicine may lower sperm counts. What side effects may I notice from receiving this medicine? Side effects that you should report to your doctor or health care professional as soon as possible: -allergic reactions like skin rash, itching or hives, swelling of the face, lips, or tongue -low blood counts - this medicine may decrease the number of white blood cells, red blood cells and platelets. You may be at increased risk for infections and bleeding. -signs of infection - fever or chills, cough, sore throat, pain or difficulty passing urine -signs of decreased platelets or bleeding - bruising, pinpoint red spots on the skin, black, tarry stools, blood in the urine -signs of decreased red blood cells - unusually weak or tired, fainting spells, lightheadedness -breathing problems, like a dry cough -changes in emotions or moods -chest pain -confusion -diarrhea -high blood pressure -mouth or throat sores or ulcers -pain, swelling, warmth in the leg -pain on swallowing -swelling of the ankles, feet, hands -trouble passing urine or change in the amount of urine -vomiting -yellowing of the eyes or skin Side effects that usually do not require medical attention (report to your doctor or health care professional if they   continue or are bothersome): -hair loss -loss of appetite -nausea -stomach upset This list may not describe all possible side effects. Call your doctor for medical advice about side effects. You may report side effects to FDA at 1-800-FDA-1088. Where should I keep my medicine? This drug is given in a hospital or clinic and will not be stored at home. NOTE: This sheet is a summary. It may not cover all possible information. If you have questions about this medicine, talk to your doctor, pharmacist, or health  care provider.  2013, Elsevier/Gold Standard. (01/08/2008 1:24:03 PM)  

## 2012-05-10 NOTE — Telephone Encounter (Signed)
gv and printed appt schedule for pt for NOV and DEc

## 2012-05-10 NOTE — Progress Notes (Signed)
[facility] [phone]   Fax:([phone]  OFFICE PROGRESS NOTE  [name], MD [address] [place] [address]  DIAGNOSIS: Metastatic non-small cell lung cancer (T2 NX M1). Presented with right upper lobe lung mass as well as multiple pulmonary nodules and bone metastasis at L3 vertebral body. This was diagnosed in March of 2006.   PRIOR THERAPY:  1. Status post palliative radiotherapy to the L3 lesion under the care of Dr. Kathrynn Running completed September 17, 2004. 2. Status post 6 cycles of systemic chemotherapy with carboplatin and paclitaxel. Last dose was given January 01, 2005. 3. Status post palliative radiotherapy to the right upper lobe lung mass under the care of Dr. Kathrynn Running completed on 12/29/2010. 4. Status post treatment with Tarceva 150 mg p.o. daily started April 2007 through July 2012. 5. Tarceva 100 mg p.o. daily started July 2012.  CURRENT THERAPY:  1. Systemic chemotherapy with Carboplatin for AUC 5 and Alimta 500 mg/m2 given every 3 weeks. Status post 2 cycles. 2. Zometa 4 mg IV q. 3 months for bone metastasis  INTERVAL HISTORY: Jamie Munoz 78 y.o. female returns to the clinic today for followup visit accompanied her friend. The patient is feeling fine today with no specific complaints except for mild fatigue. She is tolerating her treatment was carboplatin and Alimta fairly well except for fatigue from chemotherapy-induced anemia. The patient received 2 units of packed rbc's transfusion recently. She denied having any significant chest pain, shortness breath, cough or hemoptysis. She denied having any significant weight loss or night sweats. The patient has repeat CT scan of the chest, abdomen and pelvis performed recently and she is here today for evaluation and discussion of her scan results.  MEDICAL HISTORY: Past Medical History  Diagnosis Date  . Cancer   . Lung cancer   . Hypertension     ALLERGIES:  is allergic to  codeine.  MEDICATIONS:  Current Outpatient Prescriptions  Medication Sig Dispense Refill  . ALPRAZolam (XANAX) 0.25 MG tablet Take 0.25 mg by mouth at bedtime as needed.      . Alum & Mag Hydroxide-Simeth (MAGIC MOUTHWASH) SOLN Take 5 mLs by mouth 4 (four) times daily as needed.  120 mL  0  . bisacodyl (DULCOLAX) 5 MG EC tablet Take 5 mg by mouth daily as needed.      . clindamycin (CLEOCIN-T) 1 % lotion Apply topically 2 (two) times daily.  60 mL  3  . dexamethasone (DECADRON) 4 MG tablet Take 4 mg by mouth as directed. 1 tab BID day before of and after chemo      . docusate sodium (COLACE) 100 MG capsule Take 100 mg by mouth 2 (two) times daily.        . folic acid (FOLVITE) 1 MG tablet Take 1 tablet (1 mg total) by mouth daily.  30 tablet  4  . gabapentin (NEURONTIN) 100 MG capsule TAKE (2) CAPSULES THREE TIMES DAILY.  180 capsule  2  . lidocaine-prilocaine (EMLA) cream Apply topically as needed.        Marland Kitchen olmesartan (BENICAR) 20 MG tablet Take 20 mg by mouth daily.        Marland Kitchen oxyCODONE (OXYCONTIN) 20 MG 12 hr tablet Take 1 tablet (20 mg total) by mouth every 12 (twelve) hours.  60 tablet  0  . prochlorperazine (COMPAZINE) 10 MG tablet Take 1 tablet (10 mg total) by mouth every 6 (six) hours as needed.  60 tablet  0  . TANDEM 162-115.2 MG CAPS TAKE (1) CAPSULE AS DIR- ECTED.  30 each  2  . temazepam (RESTORIL) 15 MG capsule Take 1 capsule (15 mg total) by mouth at bedtime as needed.  30 capsule  0   No current facility-administered medications for this visit.   Facility-Administered Medications Ordered in Other Visits  Medication Dose Route Frequency Provider Last Rate Last Dose  . influenza  inactive virus vaccine (FLUZONE/FLUARIX) injection 0.5 mL  0.5 mL Intramuscular Once Si Gaul, MD        REVIEW OF SYSTEMS:  A comprehensive review of systems was negative except for: Constitutional: positive for fatigue   PHYSICAL EXAMINATION: General appearance: alert, cooperative, fatigued  and no distress Head: Normocephalic, without obvious abnormality, atraumatic Neck: no adenopathy Lymph nodes: Cervical, supraclavicular, and axillary nodes normal. Resp: clear to auscultation bilaterally Cardio: regular rate and rhythm, S1, S2 normal, no murmur, click, rub or gallop GI: soft, non-tender; bowel sounds normal; no masses,  no organomegaly Extremities: extremities normal, atraumatic, no cyanosis or edema Neurologic: Alert and oriented X 3, normal strength and tone. Normal symmetric reflexes. Normal coordination and gait  ECOG PERFORMANCE STATUS: 1 - Symptomatic but completely ambulatory  Blood pressure 153/67, pulse 76, temperature 98.1 F (36.7 C), temperature source Oral, resp. rate 18, height 5\' 6"  (1.676 m), weight 136 lb 14.4 oz (62.097 kg).  LABORATORY DATA: Lab Results  Component Value Date   WBC 6.5 05/10/2012   HGB 9.0* 05/10/2012   HCT 27.1* 05/10/2012   MCV 95.1 05/10/2012   PLT 134* 05/10/2012      Chemistry      Component Value Date/Time   NA 140 05/03/2012 0957   NA 139 01/25/2012 1048   NA 139 09/02/2011 1406   K 3.6 05/03/2012 0957   K 4.5 01/25/2012 1048   K 3.9 09/02/2011 1406   CL 107 05/03/2012 0957   CL 104 01/25/2012 1048   CL 97* 09/02/2011 1406   CO2 27 05/03/2012 0957   CO2 28 01/25/2012 1048   CO2 29 09/02/2011 1406   BUN 22.0 05/03/2012 0957   BUN 18 01/25/2012 1048   BUN 18 09/02/2011 1406   CREATININE 0.9 05/03/2012 0957   CREATININE 0.92 01/25/2012 1048   CREATININE 1.2 09/02/2011 1406      Component Value Date/Time   CALCIUM 8.3* 05/03/2012 0957   CALCIUM 8.5 01/25/2012 1048   CALCIUM 8.0 09/02/2011 1406   ALKPHOS 57 05/03/2012 0957   ALKPHOS 72 01/25/2012 1048   ALKPHOS 71 09/02/2011 1406   AST 21 05/03/2012 0957   AST 19 01/25/2012 1048   AST 25 09/02/2011 1406   ALT 14 05/03/2012 0957   ALT 11 01/25/2012 1048   BILITOT 0.36 05/03/2012 0957   BILITOT 0.7 01/25/2012 1048   BILITOT 0.60 09/02/2011 1406       RADIOGRAPHIC STUDIES: Ct Chest  W Contrast  05/07/2012  *RADIOLOGY REPORT*  Clinical Data:  History of lung cancer.  Status post radiation therapy.  Currently undergoing chemotherapy.  CT CHEST, ABDOMEN AND PELVIS WITH CONTRAST  Technique:  Multidetector CT imaging of the chest, abdomen and pelvis was performed following the standard protocol during bolus administration of intravenous contrast.  Contrast: OMNIPAQUE IOHEXOL 300 MG/ML  SOLN  Comparison:   None.  CT CHEST  Findings:  Right paratracheal node on image 21 measures 1.1 cm short axis dimension compared to 1.4 cm on the most recent examination.  No new or enlarging lymph nodes are  identified in the hila, axilla or mediastinum.  There is no pleural or pericardial effusion.  Heart size is mildly enlarged. The upper esophagus appears patulous with a short air fluid level present within it. The appearance is unchanged. Metastatic deposit in the manubrium is again seen.  Lungs again demonstrate a bilobed lesion in the right upper lobe. The more superior, anterior component measures 1.2 cm on image 15 in diameter compared to 1.8 cm.  The more inferior component with calcifications and areas of low attenuation measures 2.2 x 1.9 cm on image 17 compared to 3.2 x 2.9 cm.  No new or enlarging pulmonary nodule or mass is identified.  No consolidative process is seen.  IMPRESSION:  1.  Interval decrease in the size of a right upper lobe mass. Right paratracheal lymph nodes also decreased in size. 2.  No new abnormality.  CT ABDOMEN AND PELVIS  Findings:  The patient is status post cholecystectomy and hysterectomy.  Mild intra and extrahepatic biliary ductal prominence is unchanged.  There is no focal liver lesion.  The adrenal glands, spleen, pancreas and kidneys are unremarkable. Retroaortic left renal vein is noted.  No lymphadenopathy or fluid is identified.  Diverticulosis without diverticulitis is seen.  The colon is otherwise unremarkable.  The stomach and small bowel appear normal.   There is no lymphadenopathy or fluid.  Bones again demonstrate an L3 compression fracture where the patient is status post vertebral augmentation. L1 metastatic deposit and compression fracture deformity of L3 where the patient is status post vertebral augmentation again seen.  IMPRESSION:  L1 and L3 metastases are unchanged.  No new metastatic disease identified.  The appearance of the abdomen and pelvis is stable.   Original Report Authenticated By: Holley Dexter, M.D.    Ct Abdomen Pelvis W Contrast  05/07/2012  *RADIOLOGY REPORT*  Clinical Data:  History of lung cancer.  Status post radiation therapy.  Currently undergoing chemotherapy.  CT CHEST, ABDOMEN AND PELVIS WITH CONTRAST  Technique:  Multidetector CT imaging of the chest, abdomen and pelvis was performed following the standard protocol during bolus administration of intravenous contrast.  Contrast: OMNIPAQUE IOHEXOL 300 MG/ML  SOLN  Comparison:   None.  CT CHEST  Findings:  Right paratracheal node on image 21 measures 1.1 cm short axis dimension compared to 1.4 cm on the most recent examination.  No new or enlarging lymph nodes are identified in the hila, axilla or mediastinum.  There is no pleural or pericardial effusion.  Heart size is mildly enlarged. The upper esophagus appears patulous with a short air fluid level present within it. The appearance is unchanged. Metastatic deposit in the manubrium is again seen.  Lungs again demonstrate a bilobed lesion in the right upper lobe. The more superior, anterior component measures 1.2 cm on image 15 in diameter compared to 1.8 cm.  The more inferior component with calcifications and areas of low attenuation measures 2.2 x 1.9 cm on image 17 compared to 3.2 x 2.9 cm.  No new or enlarging pulmonary nodule or mass is identified.  No consolidative process is seen.  IMPRESSION:  1.  Interval decrease in the size of a right upper lobe mass. Right paratracheal lymph nodes also decreased in size. 2.   No new abnormality.  CT ABDOMEN AND PELVIS  Findings:  The patient is status post cholecystectomy and hysterectomy.  Mild intra and extrahepatic biliary ductal prominence is unchanged.  There is no focal liver lesion.  The adrenal glands, spleen, pancreas  and kidneys are unremarkable. Retroaortic left renal vein is noted.  No lymphadenopathy or fluid is identified.  Diverticulosis without diverticulitis is seen.  The colon is otherwise unremarkable.  The stomach and small bowel appear normal.  There is no lymphadenopathy or fluid.  Bones again demonstrate an L3 compression fracture where the patient is status post vertebral augmentation. L1 metastatic deposit and compression fracture deformity of L3 where the patient is status post vertebral augmentation again seen.  IMPRESSION:  L1 and L3 metastases are unchanged.  No new metastatic disease identified.  The appearance of the abdomen and pelvis is stable.   Original Report Authenticated By: Holley Dexter, M.D.     ASSESSMENT: This is a very pleasant 78 years old white female with metastatic non-small cell lung cancer currently on treatment with carboplatin and Alimta status post 3 cycles with partial response.   PLAN: I discussed the scan results with the patient and her friend. I recommended for her to continue on the same treatment regimen with carboplatin and Alimta for 3 more cycles Because of the chemotherapy-induced anemia, I would reduce her dose of carboplatin to AUC of 4.5. The patient would come back for followup visit in 3 weeks with the next cycle of her systemic chemotherapy. She was advised to call me immediately if she has any concerning symptoms in the interval.  All questions were answered. The patient knows to call the clinic with any problems, questions or concerns. We can certainly see the patient much sooner if necessary.

## 2012-05-11 NOTE — Patient Instructions (Signed)
CT scan of the chest, abdomen and pelvis showed improvement in her disease. We will continue with the same chemotherapy regimen. Followup in 3 week.

## 2012-05-14 ENCOUNTER — Other Ambulatory Visit: Payer: Self-pay | Admitting: Internal Medicine

## 2012-05-16 ENCOUNTER — Other Ambulatory Visit: Payer: Medicare Other | Admitting: Lab

## 2012-05-18 ENCOUNTER — Other Ambulatory Visit (HOSPITAL_BASED_OUTPATIENT_CLINIC_OR_DEPARTMENT_OTHER): Payer: Medicare Other | Admitting: Lab

## 2012-05-18 DIAGNOSIS — C3410 Malignant neoplasm of upper lobe, unspecified bronchus or lung: Secondary | ICD-10-CM

## 2012-05-18 DIAGNOSIS — C3490 Malignant neoplasm of unspecified part of unspecified bronchus or lung: Secondary | ICD-10-CM

## 2012-05-18 LAB — COMPREHENSIVE METABOLIC PANEL (CC13)
ALT: 20 U/L (ref 0–55)
AST: 21 U/L (ref 5–34)
Calcium: 8.1 mg/dL — ABNORMAL LOW (ref 8.4–10.4)
Chloride: 108 mEq/L — ABNORMAL HIGH (ref 98–107)
Creatinine: 0.9 mg/dL (ref 0.6–1.1)
Total Bilirubin: 0.56 mg/dL (ref 0.20–1.20)

## 2012-05-18 LAB — CBC WITH DIFFERENTIAL/PLATELET
BASO%: 0.4 % (ref 0.0–2.0)
EOS%: 3.6 % (ref 0.0–7.0)
HCT: 25.1 % — ABNORMAL LOW (ref 34.8–46.6)
MCH: 33 pg (ref 25.1–34.0)
MCHC: 34.4 g/dL (ref 31.5–36.0)
NEUT%: 53.6 % (ref 38.4–76.8)
lymph#: 0.4 10*3/uL — ABNORMAL LOW (ref 0.9–3.3)

## 2012-05-24 ENCOUNTER — Other Ambulatory Visit (HOSPITAL_BASED_OUTPATIENT_CLINIC_OR_DEPARTMENT_OTHER): Payer: Medicare Other | Admitting: Lab

## 2012-05-24 ENCOUNTER — Other Ambulatory Visit: Payer: Self-pay | Admitting: *Deleted

## 2012-05-24 ENCOUNTER — Telehealth: Payer: Self-pay | Admitting: *Deleted

## 2012-05-24 ENCOUNTER — Encounter (HOSPITAL_COMMUNITY)
Admission: RE | Admit: 2012-05-24 | Discharge: 2012-05-24 | Disposition: A | Discharge status: Home / Self Care | Payer: Medicare Other | Source: Ambulatory Visit | Attending: Internal Medicine | Admitting: Internal Medicine

## 2012-05-24 DIAGNOSIS — D649 Anemia, unspecified: Secondary | ICD-10-CM | POA: Insufficient documentation

## 2012-05-24 DIAGNOSIS — C3410 Malignant neoplasm of upper lobe, unspecified bronchus or lung: Secondary | ICD-10-CM

## 2012-05-24 DIAGNOSIS — C3490 Malignant neoplasm of unspecified part of unspecified bronchus or lung: Secondary | ICD-10-CM

## 2012-05-24 LAB — COMPREHENSIVE METABOLIC PANEL (CC13)
AST: 21 U/L (ref 5–34)
Albumin: 3.1 g/dL — ABNORMAL LOW (ref 3.5–5.0)
BUN: 12 mg/dL (ref 7.0–26.0)
CO2: 26 mEq/L (ref 22–29)
Calcium: 7.7 mg/dL — ABNORMAL LOW (ref 8.4–10.4)
Chloride: 109 mEq/L — ABNORMAL HIGH (ref 98–107)
Creatinine: 0.9 mg/dL (ref 0.6–1.1)
Glucose: 127 mg/dl — ABNORMAL HIGH (ref 70–99)
Potassium: 3.6 mEq/L (ref 3.5–5.1)

## 2012-05-24 LAB — CBC WITH DIFFERENTIAL/PLATELET
Basophils Absolute: 0 10*3/uL (ref 0.0–0.1)
EOS%: 1.4 % (ref 0.0–7.0)
Eosinophils Absolute: 0 10*3/uL (ref 0.0–0.5)
HCT: 21.2 % — ABNORMAL LOW (ref 34.8–46.6)
HGB: 7.4 g/dL — ABNORMAL LOW (ref 11.6–15.9)
MCH: 34.1 pg — ABNORMAL HIGH (ref 25.1–34.0)
MCV: 97.6 fL (ref 79.5–101.0)
NEUT#: 1.2 10*3/uL — ABNORMAL LOW (ref 1.5–6.5)
NEUT%: 58.1 % (ref 38.4–76.8)
RDW: 21.6 % — ABNORMAL HIGH (ref 11.2–14.5)
lymph#: 0.4 10*3/uL — ABNORMAL LOW (ref 0.9–3.3)

## 2012-05-24 NOTE — Telephone Encounter (Signed)
Per Dr Donnald Garre, hbg 7.4 and pt needs 2 units PRBC's.  Pt aware of appt date and time for blood transfusion.  SLJ

## 2012-05-25 ENCOUNTER — Ambulatory Visit (HOSPITAL_BASED_OUTPATIENT_CLINIC_OR_DEPARTMENT_OTHER): Payer: Medicare Other

## 2012-05-25 ENCOUNTER — Other Ambulatory Visit: Payer: Medicare Other | Admitting: Lab

## 2012-05-25 VITALS — BP 154/65 | HR 67 | Temp 97.8°F | Resp 20

## 2012-05-25 DIAGNOSIS — D649 Anemia, unspecified: Secondary | ICD-10-CM

## 2012-05-25 DIAGNOSIS — T451X5A Adverse effect of antineoplastic and immunosuppressive drugs, initial encounter: Secondary | ICD-10-CM

## 2012-05-25 MED ORDER — ACETAMINOPHEN 325 MG PO TABS
650.0000 mg | ORAL_TABLET | Freq: Once | ORAL | Status: AC
  Administered 2012-05-25: 650 mg via ORAL

## 2012-05-25 MED ORDER — SODIUM CHLORIDE 0.9 % IJ SOLN
10.0000 mL | INTRAMUSCULAR | Status: AC | PRN
  Administered 2012-05-25: 10 mL
  Filled 2012-05-25: qty 10

## 2012-05-25 MED ORDER — HEPARIN SOD (PORK) LOCK FLUSH 100 UNIT/ML IV SOLN
500.0000 [IU] | Freq: Every day | INTRAVENOUS | Status: AC | PRN
  Administered 2012-05-25: 500 [IU]
  Filled 2012-05-25: qty 5

## 2012-05-25 MED ORDER — DIPHENHYDRAMINE HCL 50 MG/ML IJ SOLN
25.0000 mg | Freq: Once | INTRAMUSCULAR | Status: AC
  Administered 2012-05-25: 25 mg via INTRAVENOUS

## 2012-05-25 MED ORDER — SODIUM CHLORIDE 0.9 % IV SOLN
250.0000 mL | Freq: Once | INTRAVENOUS | Status: AC
  Administered 2012-05-25: 250 mL via INTRAVENOUS

## 2012-05-25 NOTE — Patient Instructions (Signed)
Blood Transfusion Information WHAT IS A BLOOD TRANSFUSION? A transfusion is the replacement of blood or some of its parts. Blood is made up of multiple cells which provide different functions.  Red blood cells carry oxygen and are used for blood loss replacement.  White blood cells fight against infection.  Platelets control bleeding.  Plasma helps clot blood.  Other blood products are available for specialized needs, such as hemophilia or other clotting disorders. BEFORE THE TRANSFUSION  Who gives blood for transfusions?   You may be able to donate blood to be used at a later date on yourself (autologous donation).  Relatives can be asked to donate blood. This is generally not any safer than if you have received blood from a stranger. The same precautions are taken to ensure safety when a relative's blood is donated.  Healthy volunteers who are fully evaluated to make sure their blood is safe. This is blood bank blood. Transfusion therapy is the safest it has ever been in the practice of medicine. Before blood is taken from a donor, a complete history is taken to make sure that person has no history of diseases nor engages in risky social behavior (examples are intravenous drug use or sexual activity with multiple partners). The donor's travel history is screened to minimize risk of transmitting infections, such as malaria. The donated blood is tested for signs of infectious diseases, such as HIV and hepatitis. The blood is then tested to be sure it is compatible with you in order to minimize the chance of a transfusion reaction. If you or a relative donates blood, this is often done in anticipation of surgery and is not appropriate for emergency situations. It takes many days to process the donated blood. RISKS AND COMPLICATIONS Although transfusion therapy is very safe and saves many lives, the main dangers of transfusion include:   Getting an infectious disease.  Developing a  transfusion reaction. This is an allergic reaction to something in the blood you were given. Every precaution is taken to prevent this. The decision to have a blood transfusion has been considered carefully by your caregiver before blood is given. Blood is not given unless the benefits outweigh the risks. AFTER THE TRANSFUSION  Right after receiving a blood transfusion, you will usually feel much better and more energetic. This is especially true if your red blood cells have gotten low (anemic). The transfusion raises the level of the red blood cells which carry oxygen, and this usually causes an energy increase.  The nurse administering the transfusion will monitor you carefully for complications. HOME CARE INSTRUCTIONS  No special instructions are needed after a transfusion. You may find your energy is better. Speak with your caregiver about any limitations on activity for underlying diseases you may have. SEEK MEDICAL CARE IF:   Your condition is not improving after your transfusion.  You develop redness or irritation at the intravenous (IV) site. SEEK IMMEDIATE MEDICAL CARE IF:  Any of the following symptoms occur over the next 12 hours:  Shaking chills.  You have a temperature by mouth above 102 F (38.9 C), not controlled by medicine.  Chest, back, or muscle pain.  People around you feel you are not acting correctly or are confused.  Shortness of breath or difficulty breathing.  Dizziness and fainting.  You get a rash or develop hives.  You have a decrease in urine output.  Your urine turns a dark color or changes to pink, red, or brown. Any of the following   symptoms occur over the next 10 days:  You have a temperature by mouth above 102 F (38.9 C), not controlled by medicine.  Shortness of breath.  Weakness after normal activity.  The white part of the eye turns yellow (jaundice).  You have a decrease in the amount of urine or are urinating less often.  Your  urine turns a dark color or changes to pink, red, or brown. Document Released: 06/03/2000 Document Revised: 08/29/2011 Document Reviewed: 01/21/2008 [employer] Patient Information 2013 [employer].  

## 2012-05-27 LAB — TYPE AND SCREEN
ABO/RH(D): AB POS
Unit division: 0
Unit division: 0

## 2012-05-30 ENCOUNTER — Other Ambulatory Visit: Payer: Medicare Other | Admitting: Lab

## 2012-05-30 ENCOUNTER — Ambulatory Visit: Payer: Medicare Other

## 2012-05-31 ENCOUNTER — Other Ambulatory Visit (HOSPITAL_BASED_OUTPATIENT_CLINIC_OR_DEPARTMENT_OTHER): Payer: Medicare Other | Admitting: Lab

## 2012-05-31 ENCOUNTER — Ambulatory Visit (HOSPITAL_BASED_OUTPATIENT_CLINIC_OR_DEPARTMENT_OTHER): Payer: Medicare Other | Admitting: Physician Assistant

## 2012-05-31 ENCOUNTER — Ambulatory Visit (HOSPITAL_BASED_OUTPATIENT_CLINIC_OR_DEPARTMENT_OTHER): Payer: Medicare Other

## 2012-05-31 ENCOUNTER — Ambulatory Visit: Payer: Medicare Other

## 2012-05-31 ENCOUNTER — Telehealth: Payer: Self-pay | Admitting: Internal Medicine

## 2012-05-31 VITALS — BP 169/75 | HR 71 | Temp 97.0°F | Resp 18 | Ht 66.0 in | Wt 140.4 lb

## 2012-05-31 DIAGNOSIS — C7951 Secondary malignant neoplasm of bone: Secondary | ICD-10-CM

## 2012-05-31 DIAGNOSIS — C3410 Malignant neoplasm of upper lobe, unspecified bronchus or lung: Secondary | ICD-10-CM

## 2012-05-31 DIAGNOSIS — C3490 Malignant neoplasm of unspecified part of unspecified bronchus or lung: Secondary | ICD-10-CM

## 2012-05-31 DIAGNOSIS — C7952 Secondary malignant neoplasm of bone marrow: Secondary | ICD-10-CM

## 2012-05-31 DIAGNOSIS — R5383 Other fatigue: Secondary | ICD-10-CM

## 2012-05-31 DIAGNOSIS — T451X5A Adverse effect of antineoplastic and immunosuppressive drugs, initial encounter: Secondary | ICD-10-CM

## 2012-05-31 DIAGNOSIS — Z5111 Encounter for antineoplastic chemotherapy: Secondary | ICD-10-CM

## 2012-05-31 LAB — CBC WITH DIFFERENTIAL/PLATELET
Basophils Absolute: 0 10*3/uL (ref 0.0–0.1)
HCT: 31.4 % — ABNORMAL LOW (ref 34.8–46.6)
HGB: 10.5 g/dL — ABNORMAL LOW (ref 11.6–15.9)
MONO#: 0.6 10*3/uL (ref 0.1–0.9)
NEUT%: 81 % — ABNORMAL HIGH (ref 38.4–76.8)
Platelets: 115 10*3/uL — ABNORMAL LOW (ref 145–400)
WBC: 5.7 10*3/uL (ref 3.9–10.3)
lymph#: 0.5 10*3/uL — ABNORMAL LOW (ref 0.9–3.3)

## 2012-05-31 LAB — COMPREHENSIVE METABOLIC PANEL (CC13)
Albumin: 3.6 g/dL (ref 3.5–5.0)
BUN: 17 mg/dL (ref 7.0–26.0)
CO2: 27 mEq/L (ref 22–29)
Calcium: 8.2 mg/dL — ABNORMAL LOW (ref 8.4–10.4)
Chloride: 106 mEq/L (ref 98–107)
Glucose: 106 mg/dl — ABNORMAL HIGH (ref 70–99)
Potassium: 3.9 mEq/L (ref 3.5–5.1)

## 2012-05-31 MED ORDER — SODIUM CHLORIDE 0.9 % IV SOLN
320.0000 mg | Freq: Once | INTRAVENOUS | Status: AC
  Administered 2012-05-31: 320 mg via INTRAVENOUS
  Filled 2012-05-31: qty 32

## 2012-05-31 MED ORDER — ZOLEDRONIC ACID 4 MG/100ML IV SOLN
4.0000 mg | Freq: Once | INTRAVENOUS | Status: AC
Start: 2012-05-31 — End: 2012-05-31
  Administered 2012-05-31: 4 mg via INTRAVENOUS
  Filled 2012-05-31: qty 100

## 2012-05-31 MED ORDER — SODIUM CHLORIDE 0.9 % IJ SOLN
10.0000 mL | INTRAMUSCULAR | Status: DC | PRN
  Administered 2012-05-31: 10 mL
  Filled 2012-05-31: qty 10

## 2012-05-31 MED ORDER — SODIUM CHLORIDE 0.9 % IV SOLN
500.0000 mg/m2 | Freq: Once | INTRAVENOUS | Status: AC
  Administered 2012-05-31: 850 mg via INTRAVENOUS
  Filled 2012-05-31: qty 34

## 2012-05-31 MED ORDER — HEPARIN SOD (PORK) LOCK FLUSH 100 UNIT/ML IV SOLN
500.0000 [IU] | Freq: Once | INTRAVENOUS | Status: AC | PRN
  Administered 2012-05-31: 500 [IU]
  Filled 2012-05-31: qty 5

## 2012-05-31 MED ORDER — SODIUM CHLORIDE 0.9 % IJ SOLN
100.0000 ug | Freq: Once | INTRAVENOUS | Status: AC
  Administered 2012-05-31: 10 mg via INTRADERMAL
  Filled 2012-05-31: qty 1

## 2012-05-31 MED ORDER — DEXAMETHASONE SODIUM PHOSPHATE 4 MG/ML IJ SOLN
20.0000 mg | Freq: Once | INTRAMUSCULAR | Status: AC
  Administered 2012-05-31: 20 mg via INTRAVENOUS

## 2012-05-31 MED ORDER — ONDANSETRON 16 MG/50ML IVPB (CHCC)
16.0000 mg | Freq: Once | INTRAVENOUS | Status: AC
  Administered 2012-05-31: 16 mg via INTRAVENOUS

## 2012-05-31 NOTE — Telephone Encounter (Signed)
appts made and printed for pt  °

## 2012-06-03 NOTE — Progress Notes (Signed)
[facility] [phone]   [phone]  OFFICE PROGRESS NOTE  [name], MD [address] [place] [address]  DIAGNOSIS: Metastatic non-small cell lung cancer (T2 NX M1). Presented with right upper lobe lung mass as well as multiple pulmonary nodules and bone metastasis at L3 vertebral body. This was diagnosed in March of 2006.   PRIOR THERAPY:  1. Status post palliative radiotherapy to the L3 lesion under the care of Dr. Kathrynn Running completed September 17, 2004. 2. Status post 6 cycles of systemic chemotherapy with carboplatin and paclitaxel. Last dose was given January 01, 2005. 3. Status post palliative radiotherapy to the right upper lobe lung mass under the care of Dr. Kathrynn Running completed on 12/29/2010. 4. Status post treatment with Tarceva 150 mg p.o. daily started April 2007 through July 2012. 5. Tarceva 100 mg p.o. daily started July 2012.  CURRENT THERAPY:  1. Systemic chemotherapy with Carboplatin for AUC 5 and Alimta 500 mg/m2 given every 3 weeks. Status post 4 cycles. 2. Zometa 4 mg IV q. 3 months for bone metastasis  INTERVAL HISTORY: Jamie Munoz 78 y.o. female returns to the clinic today for followup visit. The patient is feeling fine today with no specific complaints except for mild fatigue. She is tolerating her treatment was carboplatin and Alimta fairly well except for fatigue from chemotherapy-induced anemia. The patient received 2 units of packed rbc's transfusion recently. Since that transfusion she has felt well and is back to her baseline level of fatigue. She does experience some constipation but denied any nausea vomiting or diarrhea. She reports her left great toenail may have to be operated on but she will keep [place] informed. She denied having any significant chest pain, shortness breath, cough or hemoptysis. She denied having any significant weight loss or night sweats.   MEDICAL HISTORY: Past Medical History  Diagnosis Date   . Cancer   . Lung cancer   . Hypertension     ALLERGIES:  is allergic to codeine.  MEDICATIONS:  Current Outpatient Prescriptions  Medication Sig Dispense Refill  . ALPRAZolam (XANAX) 0.25 MG tablet Take 0.25 mg by mouth at bedtime as needed.      . Alum & Mag Hydroxide-Simeth (MAGIC MOUTHWASH) SOLN Take 5 mLs by mouth 4 (four) times daily as needed.  120 mL  0  . bisacodyl (DULCOLAX) 5 MG EC tablet Take 5 mg by mouth daily as needed.      . clindamycin (CLEOCIN-T) 1 % lotion Apply topically 2 (two) times daily.  60 mL  3  . dexamethasone (DECADRON) 4 MG tablet Take 4 mg by mouth as directed. 1 tab BID day before of and after chemo      . docusate sodium (COLACE) 100 MG capsule Take 100 mg by mouth 2 (two) times daily.        . folic acid (FOLVITE) 1 MG tablet Take 1 tablet (1 mg total) by mouth daily.  30 tablet  4  . gabapentin (NEURONTIN) 100 MG capsule TAKE (2) CAPSULES THREE TIMES DAILY.  180 capsule  2  . lidocaine-prilocaine (EMLA) cream Apply topically as needed.        Marland Kitchen olmesartan (BENICAR) 20 MG tablet Take 20 mg by mouth daily.        Marland Kitchen oxyCODONE (OXYCONTIN) 20 MG 12 hr tablet Take 1 tablet (20 mg total) by mouth every 12 (twelve) hours.  60 tablet  0  . prochlorperazine (COMPAZINE) 10 MG tablet  TAKE (1) TABLET EVERY SIX HOURS AS NEEDED.  60 tablet  0  . TANDEM 162-115.2 MG CAPS TAKE (1) CAPSULE AS DIR- ECTED.  30 each  2  . temazepam (RESTORIL) 15 MG capsule Take 1 capsule (15 mg total) by mouth at bedtime as needed.  30 capsule  0   No current facility-administered medications for this visit.   Facility-Administered Medications Ordered in Other Visits  Medication Dose Route Frequency Provider Last Rate Last Dose  . influenza  inactive virus vaccine (FLUZONE/FLUARIX) injection 0.5 mL  0.5 mL Intramuscular Once Si Gaul, MD        REVIEW OF SYSTEMS:  A comprehensive review of systems was negative except for: Constitutional: positive for fatigue   PHYSICAL  EXAMINATION: General appearance: alert, cooperative, fatigued and no distress Head: Normocephalic, without obvious abnormality, atraumatic Neck: no adenopathy Lymph nodes: Cervical, supraclavicular, and axillary nodes normal. Resp: clear to auscultation bilaterally Cardio: regular rate and rhythm, S1, S2 normal, no murmur, click, rub or gallop GI: soft, non-tender; bowel sounds normal; no masses,  no organomegaly Extremities: extremities normal, atraumatic, no cyanosis or edema Neurologic: Alert and oriented X 3, normal strength and tone. Normal symmetric reflexes. Normal coordination and gait  ECOG PERFORMANCE STATUS: 1 - Symptomatic but completely ambulatory  Blood pressure 169/75, pulse 71, temperature 97 F (36.1 C), temperature source Oral, resp. rate 18, height 5\' 6"  (1.676 m), weight 140 lb 6.4 oz (63.685 kg).  LABORATORY DATA: Lab Results  Component Value Date   WBC 5.7 05/31/2012   HGB 10.5* 05/31/2012   HCT 31.4* 05/31/2012   MCV 96.6 05/31/2012   PLT 115* 05/31/2012      Chemistry      Component Value Date/Time   NA 141 05/31/2012 1151   NA 139 01/25/2012 1048   NA 139 09/02/2011 1406   K 3.9 05/31/2012 1151   K 4.5 01/25/2012 1048   K 3.9 09/02/2011 1406   CL 106 05/31/2012 1151   CL 104 01/25/2012 1048   CL 97* 09/02/2011 1406   CO2 27 05/31/2012 1151   CO2 28 01/25/2012 1048   CO2 29 09/02/2011 1406   BUN 17.0 05/31/2012 1151   BUN 18 01/25/2012 1048   BUN 18 09/02/2011 1406   CREATININE 0.8 05/31/2012 1151   CREATININE 0.92 01/25/2012 1048   CREATININE 1.2 09/02/2011 1406      Component Value Date/Time   CALCIUM 8.2* 05/31/2012 1151   CALCIUM 8.5 01/25/2012 1048   CALCIUM 8.0 09/02/2011 1406   ALKPHOS 57 05/31/2012 1151   ALKPHOS 72 01/25/2012 1048   ALKPHOS 71 09/02/2011 1406   AST 23 05/31/2012 1151   AST 19 01/25/2012 1048   AST 25 09/02/2011 1406   ALT 17 05/31/2012 1151   ALT 11 01/25/2012 1048   BILITOT 0.42 05/31/2012 1151   BILITOT 0.7 01/25/2012 1048   BILITOT  0.60 09/02/2011 1406       RADIOGRAPHIC STUDIES: Ct Chest W Contrast  05/07/2012  *RADIOLOGY REPORT*  Clinical Data:  History of lung cancer.  Status post radiation therapy.  Currently undergoing chemotherapy.  CT CHEST, ABDOMEN AND PELVIS WITH CONTRAST  Technique:  Multidetector CT imaging of the chest, abdomen and pelvis was performed following the standard protocol during bolus administration of intravenous contrast.  Contrast: OMNIPAQUE IOHEXOL 300 MG/ML  SOLN  Comparison:   None.  CT CHEST  Findings:  Right paratracheal node on image 21 measures 1.1 cm short axis dimension compared to 1.4 cm on  the most recent examination.  No new or enlarging lymph nodes are identified in the hila, axilla or mediastinum.  There is no pleural or pericardial effusion.  Heart size is mildly enlarged. The upper esophagus appears patulous with a short air fluid level present within it. The appearance is unchanged. Metastatic deposit in the manubrium is again seen.  Lungs again demonstrate a bilobed lesion in the right upper lobe. The more superior, anterior component measures 1.2 cm on image 15 in diameter compared to 1.8 cm.  The more inferior component with calcifications and areas of low attenuation measures 2.2 x 1.9 cm on image 17 compared to 3.2 x 2.9 cm.  No new or enlarging pulmonary nodule or mass is identified.  No consolidative process is seen.  IMPRESSION:  1.  Interval decrease in the size of a right upper lobe mass. Right paratracheal lymph nodes also decreased in size. 2.  No new abnormality.  CT ABDOMEN AND PELVIS  Findings:  The patient is status post cholecystectomy and hysterectomy.  Mild intra and extrahepatic biliary ductal prominence is unchanged.  There is no focal liver lesion.  The adrenal glands, spleen, pancreas and kidneys are unremarkable. Retroaortic left renal vein is noted.  No lymphadenopathy or fluid is identified.  Diverticulosis without diverticulitis is seen.  The colon is otherwise  unremarkable.  The stomach and small bowel appear normal.  There is no lymphadenopathy or fluid.  Bones again demonstrate an L3 compression fracture where the patient is status post vertebral augmentation. L1 metastatic deposit and compression fracture deformity of L3 where the patient is status post vertebral augmentation again seen.  IMPRESSION:  L1 and L3 metastases are unchanged.  No new metastatic disease identified.  The appearance of the abdomen and pelvis is stable.   Original Report Authenticated By: Holley Dexter, M.D.    Ct Abdomen Pelvis W Contrast  05/07/2012  *RADIOLOGY REPORT*  Clinical Data:  History of lung cancer.  Status post radiation therapy.  Currently undergoing chemotherapy.  CT CHEST, ABDOMEN AND PELVIS WITH CONTRAST  Technique:  Multidetector CT imaging of the chest, abdomen and pelvis was performed following the standard protocol during bolus administration of intravenous contrast.  Contrast: OMNIPAQUE IOHEXOL 300 MG/ML  SOLN  Comparison:   None.  CT CHEST  Findings:  Right paratracheal node on image 21 measures 1.1 cm short axis dimension compared to 1.4 cm on the most recent examination.  No new or enlarging lymph nodes are identified in the hila, axilla or mediastinum.  There is no pleural or pericardial effusion.  Heart size is mildly enlarged. The upper esophagus appears patulous with a short air fluid level present within it. The appearance is unchanged. Metastatic deposit in the manubrium is again seen.  Lungs again demonstrate a bilobed lesion in the right upper lobe. The more superior, anterior component measures 1.2 cm on image 15 in diameter compared to 1.8 cm.  The more inferior component with calcifications and areas of low attenuation measures 2.2 x 1.9 cm on image 17 compared to 3.2 x 2.9 cm.  No new or enlarging pulmonary nodule or mass is identified.  No consolidative process is seen.  IMPRESSION:  1.  Interval decrease in the size of a right upper lobe mass.  Right paratracheal lymph nodes also decreased in size. 2.  No new abnormality.  CT ABDOMEN AND PELVIS  Findings:  The patient is status post cholecystectomy and hysterectomy.  Mild intra and extrahepatic biliary ductal prominence is unchanged.  There is no focal liver lesion.  The adrenal glands, spleen, pancreas and kidneys are unremarkable. Retroaortic left renal vein is noted.  No lymphadenopathy or fluid is identified.  Diverticulosis without diverticulitis is seen.  The colon is otherwise unremarkable.  The stomach and small bowel appear normal.  There is no lymphadenopathy or fluid.  Bones again demonstrate an L3 compression fracture where the patient is status post vertebral augmentation. L1 metastatic deposit and compression fracture deformity of L3 where the patient is status post vertebral augmentation again seen.  IMPRESSION:  L1 and L3 metastases are unchanged.  No new metastatic disease identified.  The appearance of the abdomen and pelvis is stable.   Original Report Authenticated By: Holley Dexter, M.D.     ASSESSMENT/PLAN: This is a very pleasant 78 years old white female with metastatic non-small cell lung cancer currently on treatment with carboplatin and Alimta status post 4 cycles with partial response. The patient was discussed with Dr. Arbutus Ped. She'll proceed with cycle #5 of her systemic chemotherapy with carboplatin and Alimta. She will continue with weekly labs consisting of a CBC differential and C. met. She'll return in 3 weeks prior to cycle #6 also with repeat CBC differential and C. met.  Laural Benes, Aletta Edmunds E, PA-C   All questions were answered. The patient knows to call the clinic with any problems, questions or concerns. We can certainly see the patient much sooner if necessary.

## 2012-06-04 ENCOUNTER — Other Ambulatory Visit: Payer: Self-pay | Admitting: Certified Registered Nurse Anesthetist

## 2012-06-07 ENCOUNTER — Other Ambulatory Visit (HOSPITAL_BASED_OUTPATIENT_CLINIC_OR_DEPARTMENT_OTHER): Payer: Medicare Other | Admitting: Lab

## 2012-06-07 DIAGNOSIS — C3490 Malignant neoplasm of unspecified part of unspecified bronchus or lung: Secondary | ICD-10-CM

## 2012-06-07 DIAGNOSIS — C3410 Malignant neoplasm of upper lobe, unspecified bronchus or lung: Secondary | ICD-10-CM

## 2012-06-07 LAB — COMPREHENSIVE METABOLIC PANEL (CC13)
AST: 24 U/L (ref 5–34)
Albumin: 3.3 g/dL — ABNORMAL LOW (ref 3.5–5.0)
BUN: 18 mg/dL (ref 7.0–26.0)
Calcium: 7.3 mg/dL — ABNORMAL LOW (ref 8.4–10.4)
Chloride: 106 mEq/L (ref 98–107)
Glucose: 110 mg/dl — ABNORMAL HIGH (ref 70–99)
Potassium: 3.6 mEq/L (ref 3.5–5.1)
Sodium: 139 mEq/L (ref 136–145)
Total Protein: 6 g/dL — ABNORMAL LOW (ref 6.4–8.3)

## 2012-06-07 LAB — CBC WITH DIFFERENTIAL/PLATELET
Basophils Absolute: 0 10*3/uL (ref 0.0–0.1)
Eosinophils Absolute: 0 10*3/uL (ref 0.0–0.5)
HGB: 9.4 g/dL — ABNORMAL LOW (ref 11.6–15.9)
NEUT#: 0.9 10*3/uL — ABNORMAL LOW (ref 1.5–6.5)
RDW: 20.3 % — ABNORMAL HIGH (ref 11.2–14.5)
WBC: 1.3 10*3/uL — ABNORMAL LOW (ref 3.9–10.3)
lymph#: 0.2 10*3/uL — ABNORMAL LOW (ref 0.9–3.3)

## 2012-06-14 ENCOUNTER — Other Ambulatory Visit (HOSPITAL_BASED_OUTPATIENT_CLINIC_OR_DEPARTMENT_OTHER): Payer: Medicare Other | Admitting: Lab

## 2012-06-14 DIAGNOSIS — C3410 Malignant neoplasm of upper lobe, unspecified bronchus or lung: Secondary | ICD-10-CM

## 2012-06-14 DIAGNOSIS — C3490 Malignant neoplasm of unspecified part of unspecified bronchus or lung: Secondary | ICD-10-CM

## 2012-06-14 LAB — CBC WITH DIFFERENTIAL/PLATELET
BASO%: 0 % (ref 0.0–2.0)
Basophils Absolute: 0 10*3/uL (ref 0.0–0.1)
EOS%: 1.5 % (ref 0.0–7.0)
HGB: 8.5 g/dL — ABNORMAL LOW (ref 11.6–15.9)
MCH: 32.7 pg (ref 25.1–34.0)
RBC: 2.6 10*6/uL — ABNORMAL LOW (ref 3.70–5.45)
RDW: 18 % — ABNORMAL HIGH (ref 11.2–14.5)
lymph#: 0.4 10*3/uL — ABNORMAL LOW (ref 0.9–3.3)

## 2012-06-14 LAB — COMPREHENSIVE METABOLIC PANEL (CC13)
ALT: 21 U/L (ref 0–55)
AST: 27 U/L (ref 5–34)
Albumin: 3.2 g/dL — ABNORMAL LOW (ref 3.5–5.0)
Alkaline Phosphatase: 62 U/L (ref 40–150)
BUN: 12 mg/dL (ref 7.0–26.0)
Calcium: 7.9 mg/dL — ABNORMAL LOW (ref 8.4–10.4)
Chloride: 109 mEq/L — ABNORMAL HIGH (ref 98–107)
Potassium: 4.3 mEq/L (ref 3.5–5.1)
Sodium: 142 mEq/L (ref 136–145)
Total Protein: 6 g/dL — ABNORMAL LOW (ref 6.4–8.3)

## 2012-06-21 ENCOUNTER — Other Ambulatory Visit (HOSPITAL_BASED_OUTPATIENT_CLINIC_OR_DEPARTMENT_OTHER): Payer: Medicare Other | Admitting: Lab

## 2012-06-21 ENCOUNTER — Telehealth: Payer: Self-pay | Admitting: Internal Medicine

## 2012-06-21 ENCOUNTER — Ambulatory Visit (HOSPITAL_BASED_OUTPATIENT_CLINIC_OR_DEPARTMENT_OTHER): Payer: Medicare Other

## 2012-06-21 ENCOUNTER — Encounter: Payer: Self-pay | Admitting: Physician Assistant

## 2012-06-21 ENCOUNTER — Ambulatory Visit (HOSPITAL_BASED_OUTPATIENT_CLINIC_OR_DEPARTMENT_OTHER): Payer: Medicare Other | Admitting: Physician Assistant

## 2012-06-21 VITALS — BP 161/65 | HR 74 | Temp 97.9°F | Resp 18 | Ht 66.0 in | Wt 142.7 lb

## 2012-06-21 DIAGNOSIS — C3490 Malignant neoplasm of unspecified part of unspecified bronchus or lung: Secondary | ICD-10-CM

## 2012-06-21 DIAGNOSIS — C7951 Secondary malignant neoplasm of bone: Secondary | ICD-10-CM

## 2012-06-21 DIAGNOSIS — M7989 Other specified soft tissue disorders: Secondary | ICD-10-CM

## 2012-06-21 DIAGNOSIS — C3410 Malignant neoplasm of upper lobe, unspecified bronchus or lung: Secondary | ICD-10-CM

## 2012-06-21 DIAGNOSIS — Z5111 Encounter for antineoplastic chemotherapy: Secondary | ICD-10-CM

## 2012-06-21 LAB — CBC WITH DIFFERENTIAL/PLATELET
BASO%: 0.2 % (ref 0.0–2.0)
EOS%: 0 % (ref 0.0–7.0)
MCH: 33.2 pg (ref 25.1–34.0)
MCHC: 33.6 g/dL (ref 31.5–36.0)
NEUT%: 81.3 % — ABNORMAL HIGH (ref 38.4–76.8)
RDW: 20.1 % — ABNORMAL HIGH (ref 11.2–14.5)
lymph#: 0.6 10*3/uL — ABNORMAL LOW (ref 0.9–3.3)

## 2012-06-21 LAB — BASIC METABOLIC PANEL (CC13)
BUN: 16 mg/dL (ref 7.0–26.0)
CO2: 24 mEq/L (ref 22–29)
Calcium: 8.4 mg/dL (ref 8.4–10.4)
Creatinine: 0.8 mg/dL (ref 0.6–1.1)
Glucose: 110 mg/dl — ABNORMAL HIGH (ref 70–99)

## 2012-06-21 MED ORDER — ONDANSETRON 16 MG/50ML IVPB (CHCC)
16.0000 mg | Freq: Once | INTRAVENOUS | Status: AC
  Administered 2012-06-21: 16 mg via INTRAVENOUS

## 2012-06-21 MED ORDER — SODIUM CHLORIDE 0.9 % IV SOLN
Freq: Once | INTRAVENOUS | Status: AC
  Administered 2012-06-21: 15:00:00 via INTRAVENOUS

## 2012-06-21 MED ORDER — SODIUM CHLORIDE 0.9 % IV SOLN
500.0000 mg/m2 | Freq: Once | INTRAVENOUS | Status: AC
  Administered 2012-06-21: 850 mg via INTRAVENOUS
  Filled 2012-06-21: qty 34

## 2012-06-21 MED ORDER — OXYCODONE HCL 20 MG PO TB12: 20.0000 mg | ORAL_TABLET | Freq: Two times a day (BID) | ORAL | Status: DC

## 2012-06-21 MED ORDER — SODIUM CHLORIDE 0.9 % IJ SOLN
100.0000 ug | Freq: Once | INTRAVENOUS | Status: AC
  Administered 2012-06-21: 10 mg via INTRADERMAL
  Filled 2012-06-21: qty 1

## 2012-06-21 MED ORDER — SODIUM CHLORIDE 0.9 % IV SOLN
320.0000 mg | Freq: Once | INTRAVENOUS | Status: AC
  Administered 2012-06-21: 320 mg via INTRAVENOUS
  Filled 2012-06-21: qty 32

## 2012-06-21 MED ORDER — CYANOCOBALAMIN 1000 MCG/ML IJ SOLN
1000.0000 ug | Freq: Once | INTRAMUSCULAR | Status: AC
  Administered 2012-06-21: 1000 ug via INTRAMUSCULAR

## 2012-06-21 MED ORDER — DEXAMETHASONE SODIUM PHOSPHATE 10 MG/ML IJ SOLN
20.0000 mg | Freq: Once | INTRAMUSCULAR | Status: AC
  Administered 2012-06-21: 20 mg via INTRAVENOUS

## 2012-06-21 NOTE — Progress Notes (Signed)
Carbo test dose placed intradermally  on Left inner forearm . Minor bruising at site.  Site negative for itching  , induration at 5, 15 and 30 minutes.

## 2012-06-21 NOTE — Patient Instructions (Addendum)
[facility] Discharge Instructions for Patients Receiving Chemotherapy  Today you received the following chemotherapy agents Carboplatin,alimta To help prevent nausea and vomiting after your treatment, we encourage you to take your nausea medication   Take it as often as prescribed.   If you develop nausea and vomiting that is not controlled by your nausea medication, call the clinic. If it is after clinic hours your family physician or the after hours number for the clinic or go to the Emergency Department.   BELOW ARE SYMPTOMS THAT SHOULD BE REPORTED IMMEDIATELY:  *FEVER GREATER THAN 100.5 F  *CHILLS WITH OR WITHOUT FEVER  NAUSEA AND VOMITING THAT IS NOT CONTROLLED WITH YOUR NAUSEA MEDICATION  *UNUSUAL SHORTNESS OF BREATH  *UNUSUAL BRUISING OR BLEEDING  TENDERNESS IN MOUTH AND THROAT WITH OR WITHOUT PRESENCE OF ULCERS  *URINARY PROBLEMS  *BOWEL PROBLEMS  UNUSUAL RASH Items with * indicate a potential emergency and should be followed up as soon as possible.  If this is your first treatment one of the nurses will contact you 24 hours after your treatment. Please let the nurse know about any problems that you may have experienced. Feel free to call the clinic you have any questions or concerns. The clinic phone number is [phone].   I have been informed and understand all the instructions given to me. I know to contact the clinic, my physician, or go to the Emergency Department if any problems should occur. I do not have any questions at this time, but understand that I may call the clinic during office hours   should I have any questions or need assistance in obtaining follow up care.    __________________________________________  _____________  __________ Signature of Patient or Authorized Representative            Date                   Time    __________________________________________ Nurse's Signature

## 2012-06-21 NOTE — Telephone Encounter (Signed)
gv and printed appt schedule for pt for Jan ...the patient aware central scheduling will contact with ct d/t....schedule pt for dopper on 1.3.14 @ 10:00am

## 2012-06-21 NOTE — Patient Instructions (Addendum)
Follow up with Dr. Arbutus Ped in 3 weeks with restaging CT scan of your chest, abdomen and pelvis to re-evaluate your disease

## 2012-06-22 ENCOUNTER — Ambulatory Visit (HOSPITAL_COMMUNITY)
Admission: RE | Admit: 2012-06-22 | Discharge: 2012-06-22 | Disposition: A | Discharge status: Home / Self Care | Payer: Medicare Other | Source: Ambulatory Visit | Attending: Internal Medicine | Admitting: Internal Medicine

## 2012-06-22 DIAGNOSIS — C3490 Malignant neoplasm of unspecified part of unspecified bronchus or lung: Secondary | ICD-10-CM | POA: Insufficient documentation

## 2012-06-22 DIAGNOSIS — Z79899 Other long term (current) drug therapy: Secondary | ICD-10-CM | POA: Insufficient documentation

## 2012-06-22 DIAGNOSIS — M7989 Other specified soft tissue disorders: Secondary | ICD-10-CM | POA: Insufficient documentation

## 2012-06-22 NOTE — Progress Notes (Signed)
VASCULAR LAB PRELIMINARY  PRELIMINARY  PRELIMINARY  PRELIMINARY  Bilateral lower extremity venous duplex  completed.    Preliminary report:  Bilateral:  No evidence of DVT, superficial thrombosis, or Baker's Cyst.    Lalaine Overstreet, RVT 06/22/2012, 10:24 AM

## 2012-06-23 NOTE — Progress Notes (Signed)
[facility] Telephone:(336) [phone]   [phone]  OFFICE PROGRESS NOTE  [name], MD [address] [place] [address]  DIAGNOSIS: Metastatic non-small cell lung cancer (T2 NX M1). Presented with right upper lobe lung mass as well as multiple pulmonary nodules and bone metastasis at L3 vertebral body. This was diagnosed in March of 2006.   PRIOR THERAPY:  1. Status post palliative radiotherapy to the L3 lesion under the care of Dr. Kathrynn Running completed September 17, 2004. 2. Status post 6 cycles of systemic chemotherapy with carboplatin and paclitaxel. Last dose was given January 01, 2005. 3. Status post palliative radiotherapy to the right upper lobe lung mass under the care of Dr. Kathrynn Running completed on 12/29/2010. 4. Status post treatment with Tarceva 150 mg p.o. daily started April 2007 through July 2012. 5. Tarceva 100 mg p.o. daily started July 2012.  CURRENT THERAPY:  1. Systemic chemotherapy with Carboplatin for AUC 5 and Alimta 500 mg/m2 given every 3 weeks. Status post 5 cycles. 2. Zometa 4 mg IV q. 3 months for bone metastasis  INTERVAL HISTORY: Jamie Munoz 79 y.o. female returns to the clinic today for followup visit. The patient is feeling fine today with no specific complaints except for mild fatigue and left lower extremity swelling. The left lower extremity swelling is new over the past few days. She denies any new or previous trauma. There is some mild discomfort associated with the swelling. She is tolerating her treatment was carboplatin and Alimta fairly well except for fatigue from chemotherapy-induced anemia needing   2 units of packed rbc's transfusion. Since that transfusion she has felt well and is back to her baseline level of fatigue. She does experience some constipation but denied any nausea vomiting or diarrhea. She again reports her left great toenail may have to be operated on but she will keep [place] informed. She denied having any  significant chest pain, shortness breath, cough or hemoptysis. She denied having any significant weight loss or night sweats.   MEDICAL HISTORY: Past Medical History  Diagnosis Date  . Cancer   . Lung cancer   . Hypertension     ALLERGIES:  is allergic to codeine.  MEDICATIONS:  Current Outpatient Prescriptions  Medication Sig Dispense Refill  . ALPRAZolam (XANAX) 0.25 MG tablet Take 0.25 mg by mouth at bedtime as needed.      . Alum & Mag Hydroxide-Simeth (MAGIC MOUTHWASH) SOLN Take 5 mLs by mouth 4 (four) times daily as needed.  120 mL  0  . bisacodyl (DULCOLAX) 5 MG EC tablet Take 5 mg by mouth daily as needed.      . clindamycin (CLEOCIN-T) 1 % lotion Apply topically 2 (two) times daily.  60 mL  3  . dexamethasone (DECADRON) 4 MG tablet Take 4 mg by mouth as directed. 1 tab BID day before of and after chemo      . docusate sodium (COLACE) 100 MG capsule Take 100 mg by mouth 2 (two) times daily.        . folic acid (FOLVITE) 1 MG tablet Take 1 tablet (1 mg total) by mouth daily.  30 tablet  4  . gabapentin (NEURONTIN) 100 MG capsule TAKE (2) CAPSULES THREE TIMES DAILY.  180 capsule  2  . lidocaine-prilocaine (EMLA) cream Apply topically as needed.        Marland Kitchen olmesartan (BENICAR) 20 MG tablet Take 20 mg by mouth daily.        Marland Kitchen  oxyCODONE (OXYCONTIN) 20 MG 12 hr tablet Take 1 tablet (20 mg total) by mouth every 12 (twelve) hours.  60 tablet  0  . prochlorperazine (COMPAZINE) 10 MG tablet TAKE (1) TABLET EVERY SIX HOURS AS NEEDED.  60 tablet  0  . TANDEM 162-115.2 MG CAPS TAKE (1) CAPSULE AS DIR- ECTED.  30 each  2  . temazepam (RESTORIL) 15 MG capsule Take 1 capsule (15 mg total) by mouth at bedtime as needed.  30 capsule  0   No current facility-administered medications for this visit.   Facility-Administered Medications Ordered in Other Visits  Medication Dose Route Frequency Provider Last Rate Last Dose  . influenza  inactive virus vaccine (FLUZONE/FLUARIX) injection 0.5 mL  0.5  mL Intramuscular Once Si Gaul, MD        REVIEW OF SYSTEMS:  Pertinent items are noted in HPI.   PHYSICAL EXAMINATION: General appearance: alert, cooperative, fatigued and no distress Head: Normocephalic, without obvious abnormality, atraumatic Neck: no adenopathy Lymph nodes: Cervical, supraclavicular, and axillary nodes normal. Resp: clear to auscultation bilaterally Cardio: regular rate and rhythm, S1, S2 normal, no murmur, click, rub or gallop GI: soft, non-tender; bowel sounds normal; no masses,  no organomegaly Extremities: edema 1+ soft tissue swelling on the RLE about the ankle, 2+ soft tisseu edema w/posterior firmness LLE ankle and calf area Neurologic: Alert and oriented X 3, normal strength and tone. Normal symmetric reflexes. Normal coordination and gait  ECOG PERFORMANCE STATUS: 1 - Symptomatic but completely ambulatory  Blood pressure 161/65, pulse 74, temperature 97.9 F (36.6 C), temperature source Oral, resp. rate 18, height 5\' 6"  (1.676 m), weight 142 lb 11.2 oz (64.728 kg).  LABORATORY DATA: Lab Results  Component Value Date   WBC 6.3 06/21/2012   HGB 8.7* 06/21/2012   HCT 25.9* 06/21/2012   MCV 98.9 06/21/2012   PLT 145 06/21/2012      Chemistry      Component Value Date/Time   NA 142 06/21/2012 1327   NA 139 01/25/2012 1048   NA 139 09/02/2011 1406   K 4.1 06/21/2012 1327   K 4.5 01/25/2012 1048   K 3.9 09/02/2011 1406   CL 106 06/21/2012 1327   CL 104 01/25/2012 1048   CL 97* 09/02/2011 1406   CO2 24 06/21/2012 1327   CO2 28 01/25/2012 1048   CO2 29 09/02/2011 1406   BUN 16.0 06/21/2012 1327   BUN 18 01/25/2012 1048   BUN 18 09/02/2011 1406   CREATININE 0.8 06/21/2012 1327   CREATININE 0.92 01/25/2012 1048   CREATININE 1.2 09/02/2011 1406      Component Value Date/Time   CALCIUM 8.4 06/21/2012 1327   CALCIUM 8.5 01/25/2012 1048   CALCIUM 8.0 09/02/2011 1406   ALKPHOS 62 06/14/2012 0952   ALKPHOS 72 01/25/2012 1048   ALKPHOS 71 09/02/2011 1406   AST 27 06/14/2012 0952   AST  19 01/25/2012 1048   AST 25 09/02/2011 1406   ALT 21 06/14/2012 0952   ALT 11 01/25/2012 1048   BILITOT 0.50 06/14/2012 0952   BILITOT 0.7 01/25/2012 1048   BILITOT 0.60 09/02/2011 1406       RADIOGRAPHIC STUDIES: Ct Chest W Contrast  05/07/2012  *RADIOLOGY REPORT*  Clinical Data:  History of lung cancer.  Status post radiation therapy.  Currently undergoing chemotherapy.  CT CHEST, ABDOMEN AND PELVIS WITH CONTRAST  Technique:  Multidetector CT imaging of the chest, abdomen and pelvis was performed following the standard protocol during bolus administration of  intravenous contrast.  Contrast: OMNIPAQUE IOHEXOL 300 MG/ML  SOLN  Comparison:   None.  CT CHEST  Findings:  Right paratracheal node on image 21 measures 1.1 cm short axis dimension compared to 1.4 cm on the most recent examination.  No new or enlarging lymph nodes are identified in the hila, axilla or mediastinum.  There is no pleural or pericardial effusion.  Heart size is mildly enlarged. The upper esophagus appears patulous with a short air fluid level present within it. The appearance is unchanged. Metastatic deposit in the manubrium is again seen.  Lungs again demonstrate a bilobed lesion in the right upper lobe. The more superior, anterior component measures 1.2 cm on image 15 in diameter compared to 1.8 cm.  The more inferior component with calcifications and areas of low attenuation measures 2.2 x 1.9 cm on image 17 compared to 3.2 x 2.9 cm.  No new or enlarging pulmonary nodule or mass is identified.  No consolidative process is seen.  IMPRESSION:  1.  Interval decrease in the size of a right upper lobe mass. Right paratracheal lymph nodes also decreased in size. 2.  No new abnormality.  CT ABDOMEN AND PELVIS  Findings:  The patient is status post cholecystectomy and hysterectomy.  Mild intra and extrahepatic biliary ductal prominence is unchanged.  There is no focal liver lesion.  The adrenal glands, spleen, pancreas and kidneys are  unremarkable. Retroaortic left renal vein is noted.  No lymphadenopathy or fluid is identified.  Diverticulosis without diverticulitis is seen.  The colon is otherwise unremarkable.  The stomach and small bowel appear normal.  There is no lymphadenopathy or fluid.  Bones again demonstrate an L3 compression fracture where the patient is status post vertebral augmentation. L1 metastatic deposit and compression fracture deformity of L3 where the patient is status post vertebral augmentation again seen.  IMPRESSION:  L1 and L3 metastases are unchanged.  No new metastatic disease identified.  The appearance of the abdomen and pelvis is stable.   Original Report Authenticated By: Holley Dexter, M.D.    Ct Abdomen Pelvis W Contrast  05/07/2012  *RADIOLOGY REPORT*  Clinical Data:  History of lung cancer.  Status post radiation therapy.  Currently undergoing chemotherapy.  CT CHEST, ABDOMEN AND PELVIS WITH CONTRAST  Technique:  Multidetector CT imaging of the chest, abdomen and pelvis was performed following the standard protocol during bolus administration of intravenous contrast.  Contrast: OMNIPAQUE IOHEXOL 300 MG/ML  SOLN  Comparison:   None.  CT CHEST  Findings:  Right paratracheal node on image 21 measures 1.1 cm short axis dimension compared to 1.4 cm on the most recent examination.  No new or enlarging lymph nodes are identified in the hila, axilla or mediastinum.  There is no pleural or pericardial effusion.  Heart size is mildly enlarged. The upper esophagus appears patulous with a short air fluid level present within it. The appearance is unchanged. Metastatic deposit in the manubrium is again seen.  Lungs again demonstrate a bilobed lesion in the right upper lobe. The more superior, anterior component measures 1.2 cm on image 15 in diameter compared to 1.8 cm.  The more inferior component with calcifications and areas of low attenuation measures 2.2 x 1.9 cm on image 17 compared to 3.2 x 2.9 cm.  No  new or enlarging pulmonary nodule or mass is identified.  No consolidative process is seen.  IMPRESSION:  1.  Interval decrease in the size of a right upper lobe mass. Right  paratracheal lymph nodes also decreased in size. 2.  No new abnormality.  CT ABDOMEN AND PELVIS  Findings:  The patient is status post cholecystectomy and hysterectomy.  Mild intra and extrahepatic biliary ductal prominence is unchanged.  There is no focal liver lesion.  The adrenal glands, spleen, pancreas and kidneys are unremarkable. Retroaortic left renal vein is noted.  No lymphadenopathy or fluid is identified.  Diverticulosis without diverticulitis is seen.  The colon is otherwise unremarkable.  The stomach and small bowel appear normal.  There is no lymphadenopathy or fluid.  Bones again demonstrate an L3 compression fracture where the patient is status post vertebral augmentation. L1 metastatic deposit and compression fracture deformity of L3 where the patient is status post vertebral augmentation again seen.  IMPRESSION:  L1 and L3 metastases are unchanged.  No new metastatic disease identified.  The appearance of the abdomen and pelvis is stable.   Original Report Authenticated By: Holley Dexter, M.D.     ASSESSMENT/PLAN: This is a very pleasant 79 years old white female with metastatic non-small cell lung cancer currently on treatment with carboplatin and Alimta status post 4 cycles with partial response. The patient was discussed with Dr. Arbutus Ped. She'll proceed with cycle #6 of her systemic chemotherapy with carboplatin and Alimta. She will continue with weekly labs consisting of a CBC differential and C. met. She will follow up with Dr. Arbutus Ped in   3 weeks with a restaging CT scan of her chest, abdomen and pelvis to re-evaluate her disease. She will be set up for bilateral lower extremity dopplers to address the lower extremity swelling, to be evaluated for possible DVTs. Should this study be positive, appropriate  anticoagulation therapy will be instituted.  Laural Benes, Josuel Koeppen E, PA-C   All questions were answered. The patient knows to call the clinic with any problems, questions or concerns. We can certainly see the patient much sooner if necessary.

## 2012-06-28 ENCOUNTER — Other Ambulatory Visit (HOSPITAL_BASED_OUTPATIENT_CLINIC_OR_DEPARTMENT_OTHER): Payer: Medicare Other | Admitting: Lab

## 2012-06-28 DIAGNOSIS — C3410 Malignant neoplasm of upper lobe, unspecified bronchus or lung: Secondary | ICD-10-CM

## 2012-06-28 DIAGNOSIS — C3490 Malignant neoplasm of unspecified part of unspecified bronchus or lung: Secondary | ICD-10-CM

## 2012-06-28 LAB — COMPREHENSIVE METABOLIC PANEL (CC13)
ALT: 10 U/L (ref 0–55)
AST: 24 U/L (ref 5–34)
Albumin: 3.1 g/dL — ABNORMAL LOW (ref 3.5–5.0)
Alkaline Phosphatase: 53 U/L (ref 40–150)
Potassium: 3.2 mEq/L — ABNORMAL LOW (ref 3.5–5.1)
Sodium: 139 mEq/L (ref 136–145)
Total Protein: 6.1 g/dL — ABNORMAL LOW (ref 6.4–8.3)

## 2012-06-28 LAB — CBC WITH DIFFERENTIAL/PLATELET
BASO%: 0 % (ref 0.0–2.0)
EOS%: 2.5 % (ref 0.0–7.0)
MCH: 33.5 pg (ref 25.1–34.0)
MCV: 100 fL (ref 79.5–101.0)
MONO%: 12.7 % (ref 0.0–14.0)
RBC: 2.24 10*6/uL — ABNORMAL LOW (ref 3.70–5.45)
RDW: 18.9 % — ABNORMAL HIGH (ref 11.2–14.5)
lymph#: 0.4 10*3/uL — ABNORMAL LOW (ref 0.9–3.3)

## 2012-07-02 ENCOUNTER — Other Ambulatory Visit: Payer: Self-pay | Admitting: Physician Assistant

## 2012-07-05 ENCOUNTER — Other Ambulatory Visit: Payer: Self-pay | Admitting: Medical Oncology

## 2012-07-05 ENCOUNTER — Encounter (HOSPITAL_COMMUNITY)
Admission: RE | Admit: 2012-07-05 | Discharge: 2012-07-05 | Disposition: A | Discharge status: Home / Self Care | Payer: Medicare Other | Source: Ambulatory Visit | Attending: Internal Medicine | Admitting: Internal Medicine

## 2012-07-05 ENCOUNTER — Other Ambulatory Visit (HOSPITAL_BASED_OUTPATIENT_CLINIC_OR_DEPARTMENT_OTHER): Payer: Medicare Other | Admitting: Lab

## 2012-07-05 ENCOUNTER — Telehealth: Payer: Self-pay | Admitting: *Deleted

## 2012-07-05 ENCOUNTER — Ambulatory Visit: Payer: Medicare Other | Admitting: Lab

## 2012-07-05 DIAGNOSIS — C3490 Malignant neoplasm of unspecified part of unspecified bronchus or lung: Secondary | ICD-10-CM

## 2012-07-05 DIAGNOSIS — D649 Anemia, unspecified: Secondary | ICD-10-CM | POA: Insufficient documentation

## 2012-07-05 LAB — CBC WITH DIFFERENTIAL/PLATELET
Basophils Absolute: 0 10*3/uL (ref 0.0–0.1)
EOS%: 1.6 % (ref 0.0–7.0)
HGB: 6.5 g/dL — CL (ref 11.6–15.9)
MCH: 34 pg (ref 25.1–34.0)
MCHC: 33.3 g/dL (ref 31.5–36.0)
MCV: 102.1 fL — ABNORMAL HIGH (ref 79.5–101.0)
MONO%: 20.1 % — ABNORMAL HIGH (ref 0.0–14.0)
RBC: 1.91 10*6/uL — ABNORMAL LOW (ref 3.70–5.45)
RDW: 19 % — ABNORMAL HIGH (ref 11.2–14.5)

## 2012-07-05 LAB — COMPREHENSIVE METABOLIC PANEL (CC13)
AST: 19 U/L (ref 5–34)
Albumin: 3 g/dL — ABNORMAL LOW (ref 3.5–5.0)
Alkaline Phosphatase: 57 U/L (ref 40–150)
BUN: 15 mg/dL (ref 7.0–26.0)
Potassium: 3.5 mEq/L (ref 3.5–5.1)

## 2012-07-05 NOTE — Telephone Encounter (Signed)
Per staff phone call I have scheduled appt.  JMW

## 2012-07-06 ENCOUNTER — Ambulatory Visit (HOSPITAL_BASED_OUTPATIENT_CLINIC_OR_DEPARTMENT_OTHER): Payer: Medicare Other

## 2012-07-06 VITALS — BP 133/48 | HR 67 | Temp 98.3°F | Resp 18

## 2012-07-06 DIAGNOSIS — D649 Anemia, unspecified: Secondary | ICD-10-CM

## 2012-07-06 LAB — PREPARE RBC (CROSSMATCH)

## 2012-07-06 MED ORDER — HEPARIN SOD (PORK) LOCK FLUSH 100 UNIT/ML IV SOLN
500.0000 [IU] | Freq: Every day | INTRAVENOUS | Status: AC | PRN
  Administered 2012-07-06: 500 [IU]
  Filled 2012-07-06: qty 5

## 2012-07-06 MED ORDER — ACETAMINOPHEN 325 MG PO TABS
650.0000 mg | ORAL_TABLET | Freq: Once | ORAL | Status: AC
  Administered 2012-07-06: 650 mg via ORAL

## 2012-07-06 MED ORDER — SODIUM CHLORIDE 0.9 % IJ SOLN
10.0000 mL | INTRAMUSCULAR | Status: AC | PRN
  Administered 2012-07-06: 10 mL
  Filled 2012-07-06: qty 10

## 2012-07-06 MED ORDER — DIPHENHYDRAMINE HCL 50 MG/ML IJ SOLN
25.0000 mg | Freq: Once | INTRAMUSCULAR | Status: AC
  Administered 2012-07-06: 25 mg via INTRAVENOUS

## 2012-07-06 MED ORDER — SODIUM CHLORIDE 0.9 % IV SOLN
250.0000 mL | Freq: Once | INTRAVENOUS | Status: AC
  Administered 2012-07-06: 250 mL via INTRAVENOUS

## 2012-07-06 NOTE — Patient Instructions (Signed)
Blood Transfusion   A blood transfusion replaces your blood or some of its parts. Blood is replaced when you have lost blood because of surgery, an accident, or for severe blood conditions like anemia.  You can donate blood to be used on yourself if you have a planned surgery. If you lose blood during that surgery, your own blood can be given back to you.  Any blood given to you is checked to make sure it matches your blood type. Your temperature, blood pressure, and heart rate (vital signs) will be checked often.   GET HELP RIGHT AWAY IF:    You feel sick to your stomach (nauseous) or throw up (vomit).   You have watery poop (diarrhea).   You have shortness of breath or trouble breathing.   You have blood in your pee (urine) or have dark colored pee.   You have chest pain or tightness.   Your eyes or skin turn yellow (jaundice).   You have a temperature by mouth above 102 F (38.9 C), not controlled by medicine.   You start to shake and have chills.   You develop a a red rash (hives) or feel itchy.   You develop lightheadedness or feel confused.   You develop back, joint, or muscle pain.   You do not feel hungry (lost appetite).   You feel tired, restless, or nervous.   You develop belly (abdominal) cramps.  Document Released: 09/02/2008 Document Revised: 08/29/2011 Document Reviewed: 09/02/2008  [employer] Patient Information 2013 [employer].

## 2012-07-07 LAB — TYPE AND SCREEN
Antibody Screen: NEGATIVE
Unit division: 0

## 2012-07-12 ENCOUNTER — Other Ambulatory Visit (HOSPITAL_BASED_OUTPATIENT_CLINIC_OR_DEPARTMENT_OTHER): Payer: Medicare Other | Admitting: Lab

## 2012-07-12 ENCOUNTER — Ambulatory Visit (HOSPITAL_COMMUNITY)
Admission: RE | Admit: 2012-07-12 | Discharge: 2012-07-12 | Disposition: A | Discharge status: Home / Self Care | Payer: Medicare Other | Source: Ambulatory Visit | Attending: Physician Assistant | Admitting: Physician Assistant

## 2012-07-12 ENCOUNTER — Encounter (HOSPITAL_COMMUNITY): Payer: Self-pay

## 2012-07-12 ENCOUNTER — Telehealth: Payer: Self-pay | Admitting: *Deleted

## 2012-07-12 DIAGNOSIS — C3410 Malignant neoplasm of upper lobe, unspecified bronchus or lung: Secondary | ICD-10-CM

## 2012-07-12 DIAGNOSIS — K5730 Diverticulosis of large intestine without perforation or abscess without bleeding: Secondary | ICD-10-CM | POA: Insufficient documentation

## 2012-07-12 DIAGNOSIS — C3490 Malignant neoplasm of unspecified part of unspecified bronchus or lung: Secondary | ICD-10-CM

## 2012-07-12 DIAGNOSIS — K838 Other specified diseases of biliary tract: Secondary | ICD-10-CM | POA: Insufficient documentation

## 2012-07-12 DIAGNOSIS — M948X9 Other specified disorders of cartilage, unspecified sites: Secondary | ICD-10-CM | POA: Insufficient documentation

## 2012-07-12 LAB — COMPREHENSIVE METABOLIC PANEL (CC13)
ALT: 12 U/L (ref 0–55)
CO2: 26 mEq/L (ref 22–29)
Calcium: 8.2 mg/dL — ABNORMAL LOW (ref 8.4–10.4)
Chloride: 104 mEq/L (ref 98–107)
Sodium: 142 mEq/L (ref 136–145)
Total Bilirubin: 0.64 mg/dL (ref 0.20–1.20)
Total Protein: 6.5 g/dL (ref 6.4–8.3)

## 2012-07-12 LAB — CBC WITH DIFFERENTIAL/PLATELET
BASO%: 0.3 % (ref 0.0–2.0)
MCHC: 35.1 g/dL (ref 31.5–36.0)
MONO#: 0.9 10*3/uL (ref 0.1–0.9)
RBC: 2.94 10*6/uL — ABNORMAL LOW (ref 3.70–5.45)
WBC: 4.5 10*3/uL (ref 3.9–10.3)
lymph#: 0.5 10*3/uL — ABNORMAL LOW (ref 0.9–3.3)

## 2012-07-12 MED ORDER — IOHEXOL 300 MG/ML  SOLN
100.0000 mL | Freq: Once | INTRAMUSCULAR | Status: AC | PRN
  Administered 2012-07-12: 100 mL via INTRAVENOUS

## 2012-07-12 NOTE — Telephone Encounter (Signed)
PT. HAS SEEN HER PRIMARY CARE PHYSICIAN TWICE THIS WEEK ON Monday AND Wednesday. SHE HAS BEEN ON CIPRO AND KEFLEX. YESTERDAY RECEIVED AN INJECTION OF ROCEPHIN. NOTIFIED ADRENA JOHNSON,PA. VERBAL ORDER AND READ BACK TO ADRENA JOHNSON,PA- CONTINUE WITH TREATMENT PER PCP. UPDATE THIS OFFICE AS NEEDED. NOTIFIED PT. OF ABOVE INSTRUCTIONS. SHE VOICES UNDERSTANDING.

## 2012-07-13 ENCOUNTER — Other Ambulatory Visit (HOSPITAL_COMMUNITY): Payer: Medicare Other

## 2012-07-16 ENCOUNTER — Ambulatory Visit (HOSPITAL_BASED_OUTPATIENT_CLINIC_OR_DEPARTMENT_OTHER): Payer: Medicare Other | Admitting: Internal Medicine

## 2012-07-16 ENCOUNTER — Encounter: Payer: Self-pay | Admitting: Internal Medicine

## 2012-07-16 ENCOUNTER — Telehealth: Payer: Self-pay | Admitting: Internal Medicine

## 2012-07-16 ENCOUNTER — Telehealth: Payer: Self-pay | Admitting: *Deleted

## 2012-07-16 VITALS — BP 149/58 | HR 95 | Temp 98.7°F | Resp 18 | Ht 66.0 in | Wt 140.3 lb

## 2012-07-16 DIAGNOSIS — L03116 Cellulitis of left lower limb: Secondary | ICD-10-CM

## 2012-07-16 DIAGNOSIS — L03119 Cellulitis of unspecified part of limb: Secondary | ICD-10-CM

## 2012-07-16 DIAGNOSIS — C7951 Secondary malignant neoplasm of bone: Secondary | ICD-10-CM

## 2012-07-16 DIAGNOSIS — L02419 Cutaneous abscess of limb, unspecified: Secondary | ICD-10-CM

## 2012-07-16 DIAGNOSIS — C3490 Malignant neoplasm of unspecified part of unspecified bronchus or lung: Secondary | ICD-10-CM

## 2012-07-16 DIAGNOSIS — C3410 Malignant neoplasm of upper lobe, unspecified bronchus or lung: Secondary | ICD-10-CM

## 2012-07-16 HISTORY — DX: Cellulitis of left lower limb: L03.116

## 2012-07-16 NOTE — Progress Notes (Signed)
[facility] Telephone:(336) [phone]   Fax:(336) [phone]  OFFICE PROGRESS NOTE  [name], MD [address] [place] [address]  DIAGNOSIS: Metastatic non-small cell lung cancer (T2 NX M1). Presented with right upper lobe lung mass as well as multiple pulmonary nodules and bone metastasis at L3 vertebral body. This was diagnosed in March of 2006.   PRIOR THERAPY:  1. Status post palliative radiotherapy to the L3 lesion under the care of Dr. Kathrynn Running completed September 17, 2004. 2. Status post 6 cycles of systemic chemotherapy with carboplatin and paclitaxel. Last dose was given January 01, 2005. 3. Status post palliative radiotherapy to the right upper lobe lung mass under the care of Dr. Kathrynn Running completed on 12/29/2010. 4. Status post treatment with Tarceva 150 mg p.o. daily started April 2007 through July 2012. 5. Tarceva 100 mg p.o. daily started July 2012.  CURRENT THERAPY:  1. Systemic chemotherapy with Carboplatin for AUC 5 and Alimta 500 mg/m2 given every 3 weeks. Status post 5 cycles. 2. Zometa 4 mg IV q. 3 months for bone metastasis  INTERVAL HISTORY: Jamie Munoz 79 y.o. female returns to the clinic today for followup visit accompanied by a friend. The patient is feeling fine today with no specific complaints except for cellulitis of the left lower extremity started more than 2 weeks ago. She was treated by her primary care physician with ceftriaxone and Cipro with no significant improvement. The patient tolerated her last cycle of the chemotherapy fairly well. She denied having any significant weight loss or night sweats. She denied having any chest pain, shortness breath, cough or hemoptysis. She had repeat CT scan of the chest, abdomen and pelvis performed recently and she is here for evaluation and discussion of her scan results.  MEDICAL HISTORY: Past Medical History  Diagnosis Date  . Cancer   . Lung cancer   . Hypertension     ALLERGIES:  is  allergic to codeine.  MEDICATIONS:  Current Outpatient Prescriptions  Medication Sig Dispense Refill  . ALPRAZolam (XANAX) 0.25 MG tablet Take 0.25 mg by mouth at bedtime as needed.      . Alum & Mag Hydroxide-Simeth (MAGIC MOUTHWASH) SOLN Take 5 mLs by mouth 4 (four) times daily as needed.  120 mL  0  . bisacodyl (DULCOLAX) 5 MG EC tablet Take 5 mg by mouth daily as needed.      . cephALEXin (KEFLEX) 500 MG capsule Take 500 mg by mouth.      . ciprofloxacin (CIPRO) 500 MG tablet Take 500 mg by mouth Twice daily.      . clindamycin (CLEOCIN T) 1 % lotion APPLY TOPICALLY 2 TIMES A DAY  60 mL  2  . dexamethasone (DECADRON) 4 MG tablet Take 4 mg by mouth as directed. 1 tab BID day before of and after chemo      . docusate sodium (COLACE) 100 MG capsule Take 100 mg by mouth 2 (two) times daily.        . folic acid (FOLVITE) 1 MG tablet Take 1 tablet (1 mg total) by mouth daily.  30 tablet  4  . furosemide (LASIX) 20 MG tablet Take 20 mg by mouth Daily.      Marland Kitchen gabapentin (NEURONTIN) 100 MG capsule TAKE (2) CAPSULES THREE TIMES DAILY.  180 capsule  2  . lidocaine-prilocaine (EMLA) cream Apply topically as needed.        Marland Kitchen olmesartan (BENICAR) 20 MG tablet Take 20  mg by mouth daily.        Marland Kitchen oxyCODONE (OXYCONTIN) 20 MG 12 hr tablet Take 1 tablet (20 mg total) by mouth every 12 (twelve) hours.  60 tablet  0  . prochlorperazine (COMPAZINE) 10 MG tablet TAKE (1) TABLET EVERY SIX HOURS AS NEEDED.  60 tablet  0  . TANDEM 162-115.2 MG CAPS TAKE (1) CAPSULE AS DIR- ECTED.  30 each  2  . temazepam (RESTORIL) 15 MG capsule Take 1 capsule (15 mg total) by mouth at bedtime as needed.  30 capsule  0   No current facility-administered medications for this visit.   Facility-Administered Medications Ordered in Other Visits  Medication Dose Route Frequency Provider Last Rate Last Dose  . influenza  inactive virus vaccine (FLUZONE/FLUARIX) injection 0.5 mL  0.5 mL Intramuscular Once Si Gaul, MD         REVIEW OF SYSTEMS:  A comprehensive review of systems was negative except for: Constitutional: positive for fatigue Cellulitis of the left lower extremity   PHYSICAL EXAMINATION: General appearance: alert, cooperative and no distress Head: Normocephalic, without obvious abnormality, atraumatic Neck: no adenopathy Lymph nodes: Cervical, supraclavicular, and axillary nodes normal. Resp: clear to auscultation bilaterally Cardio: regular rate and rhythm, S1, S2 normal, no murmur, click, rub or gallop GI: soft, non-tender; bowel sounds normal; no masses,  no organomegaly Extremities: Significant cellulitis of the left lower extremity Neurologic: Alert and oriented X 3, normal strength and tone. Normal symmetric reflexes. Normal coordination and gait  ECOG PERFORMANCE STATUS: 1 - Symptomatic but completely ambulatory  Blood pressure 149/58, pulse 95, temperature 98.7 F (37.1 C), temperature source Oral, resp. rate 18, height 5\' 6"  (1.676 m), weight 140 lb 4.8 oz (63.64 kg).  LABORATORY DATA: Lab Results  Component Value Date   WBC 4.5 07/12/2012   HGB 10.3* 07/12/2012   HCT 29.3* 07/12/2012   MCV 99.7 07/12/2012   PLT 115* 07/12/2012      Chemistry      Component Value Date/Time   NA 142 07/12/2012 0950   NA 139 01/25/2012 1048   NA 139 09/02/2011 1406   K 3.4* 07/12/2012 0950   K 4.5 01/25/2012 1048   K 3.9 09/02/2011 1406   CL 104 07/12/2012 0950   CL 104 01/25/2012 1048   CL 97* 09/02/2011 1406   CO2 26 07/12/2012 0950   CO2 28 01/25/2012 1048   CO2 29 09/02/2011 1406   BUN 13.0 07/12/2012 0950   BUN 18 01/25/2012 1048   BUN 18 09/02/2011 1406   CREATININE 1.0 07/12/2012 0950   CREATININE 0.92 01/25/2012 1048   CREATININE 1.2 09/02/2011 1406      Component Value Date/Time   CALCIUM 8.2* 07/12/2012 0950   CALCIUM 8.5 01/25/2012 1048   CALCIUM 8.0 09/02/2011 1406   ALKPHOS 57 07/12/2012 0950   ALKPHOS 72 01/25/2012 1048   ALKPHOS 71 09/02/2011 1406   AST 22 07/12/2012 0950   AST 19 01/25/2012  1048   AST 25 09/02/2011 1406   ALT 12 07/12/2012 0950   ALT 11 01/25/2012 1048   BILITOT 0.64 07/12/2012 0950   BILITOT 0.7 01/25/2012 1048   BILITOT 0.60 09/02/2011 1406       RADIOGRAPHIC STUDIES: Ct Chest W Contrast  07/12/2012  *RADIOLOGY REPORT*  Clinical Data:  Restaging lung cancer.  CT CHEST, ABDOMEN AND PELVIS WITH CONTRAST  Technique:  Multidetector CT imaging of the chest, abdomen and pelvis was performed following the standard protocol during bolus administration of intravenous  contrast.  Contrast: OMNIPAQUE IOHEXOL 300 MG/ML  SOLN  Comparison:  CT 05/08/2012, PET CT 11/26/2010   CT CHEST  Findings:  There is a port in the right anterior chest wall.  No axillary or supraclavicular lymphadenopathy.  No mediastinal or hilar lymphadenopathy.  There is gas and fluid in  the esophagus. No pericardial fluid.  Review of the lung parenchyma demonstrates a rounded right upper lobe nodule measuring 26 x 25 mm (image 18, series 4) compared to 26 x 24 mm on prior for no significant change. This nodule was hypermetabolic on comparison PET CT scan.  There is linear thickening superior to this lesion which is also unchanged.  There is bibasilar nodular pleural thickening  IMPRESSION:  1.  Stable round right upper lobe 2.5 cm nodule.  2.  No evidence of disease progression.   CT ABDOMEN AND PELVIS  Findings:  There is no focal hepatic lesion.  There is persistent biliary ductal dilatation the common bile duct which is unchanged compared to prior.  The patient is post cholecystectomy. The pancreas, spleen, adrenal glands, and kidneys are normal.  The stomach, small bowel, and colon are normal.  Abdominal aorta normal caliber.  No retroperitoneal periportal lymphadenopathy.  No free fluid the pelvis.   No pelvic lymphadenopathy.  There is diverticular disease as sigmoid colon.  The skeleton demonstrates a rounded sclerotic lesion in the L1 vertebral body unchanged compared to prior. Sclerotic changes at L3  with volume loss is also unchanged. These lesions were not hypermetabolic on comparison PET CT scan.  IMPRESSION:  1.  No evidence metastasis in the abdomen pelvis. 2.  Stable sclerotic lesion in the lumbar spine which were not hypermetabolic on comparison PET CT scan and likely represent treated metastasis.   Original Report Authenticated By: Genevive Bi, M.D.    ASSESSMENT: This is a very pleasant 79 years old white female with metastatic non-small cell lung cancer recently completed 6 cycles of systemic chemotherapy with carboplatin and Alimta with stable disease. The patient developed significant cellulitis of the left lower extremity over the last 2 weeks and it's not responding to the oral home antibiotics.   PLAN: I have a lengthy discussion with the patient today about her condition. I would recommend for the patient and maintenance chemotherapy with single agent Alimta 500 mg/M2 every 3 weeks. I would consider starting the first cycle of this maintenance therapy in 2 weeks after improvement in her left lower extremity cellulitis. For the left lower extremity cellulitis, I recommended for the patient treatment on an inpatient basis with IV antibiotics. The patient would like to go home first to take care of her dog before going to the emergency Department for evaluation and admission. She would come back for followup visit in 2 weeks for evaluation and consideration of resuming systemic chemotherapy. She was advised to call me immediately if she has any concerning symptoms in the interval. All questions were answered. The patient knows to call the clinic with any problems, questions or concerns. We can certainly see the patient much sooner if necessary.  I spent 15 minutes counseling the patient face to face. The total time spent in the appointment was 25 minutes.

## 2012-07-16 NOTE — Patient Instructions (Signed)
Your scan showed stable disease. We discussed maintenance chemotherapy with Alimta. Consider going to the emergency Department for evaluation of your left lower extremity cellulitis that is not responding to the oral home antibiotics.

## 2012-07-16 NOTE — Telephone Encounter (Signed)
appts made and printed for pt,pt aware that chemo will be added and an email has been sent to mw      anne

## 2012-07-16 NOTE — Telephone Encounter (Signed)
Per staff message and POF I have scheduled appts.  JMW  

## 2012-07-17 ENCOUNTER — Inpatient Hospital Stay (HOSPITAL_COMMUNITY)
Admission: EM | Admit: 2012-07-17 | Discharge: 2012-07-19 | DRG: 603 | Disposition: A | Discharge status: Home / Self Care | Payer: Medicare Other | Attending: Internal Medicine | Admitting: Internal Medicine

## 2012-07-17 ENCOUNTER — Encounter (HOSPITAL_COMMUNITY): Payer: Self-pay | Admitting: *Deleted

## 2012-07-17 DIAGNOSIS — C3490 Malignant neoplasm of unspecified part of unspecified bronchus or lung: Secondary | ICD-10-CM | POA: Diagnosis present

## 2012-07-17 DIAGNOSIS — E876 Hypokalemia: Secondary | ICD-10-CM | POA: Diagnosis present

## 2012-07-17 DIAGNOSIS — D630 Anemia in neoplastic disease: Secondary | ICD-10-CM | POA: Diagnosis present

## 2012-07-17 DIAGNOSIS — Z66 Do not resuscitate: Secondary | ICD-10-CM | POA: Diagnosis present

## 2012-07-17 DIAGNOSIS — N179 Acute kidney failure, unspecified: Secondary | ICD-10-CM | POA: Diagnosis present

## 2012-07-17 DIAGNOSIS — L02419 Cutaneous abscess of limb, unspecified: Principal | ICD-10-CM | POA: Diagnosis present

## 2012-07-17 DIAGNOSIS — D539 Nutritional anemia, unspecified: Secondary | ICD-10-CM | POA: Diagnosis present

## 2012-07-17 DIAGNOSIS — M7989 Other specified soft tissue disorders: Secondary | ICD-10-CM

## 2012-07-17 DIAGNOSIS — L03116 Cellulitis of left lower limb: Secondary | ICD-10-CM | POA: Diagnosis present

## 2012-07-17 LAB — CBC WITH DIFFERENTIAL/PLATELET
Basophils Relative: 0 % (ref 0–1)
Eosinophils Absolute: 0 10*3/uL (ref 0.0–0.7)
Eosinophils Relative: 1 % (ref 0–5)
MCH: 34.4 pg — ABNORMAL HIGH (ref 26.0–34.0)
MCHC: 33.9 g/dL (ref 30.0–36.0)
MCV: 101.4 fL — ABNORMAL HIGH (ref 78.0–100.0)
Neutrophils Relative %: 69 % (ref 43–77)
Platelets: 162 10*3/uL (ref 150–400)
RDW: 18.6 % — ABNORMAL HIGH (ref 11.5–15.5)

## 2012-07-17 LAB — COMPREHENSIVE METABOLIC PANEL
AST: 22 U/L (ref 0–37)
Creatinine, Ser: 1.05 mg/dL (ref 0.50–1.10)
GFR calc non Af Amer: 48 mL/min — ABNORMAL LOW (ref >90)
Glucose, Bld: 98 mg/dL (ref 70–99)
Potassium: 3.1 mEq/L — ABNORMAL LOW (ref 3.5–5.1)
Sodium: 138 mEq/L (ref 135–145)
Total Bilirubin: 0.6 mg/dL (ref 0.3–1.2)

## 2012-07-17 MED ORDER — CLINDAMYCIN PHOSPHATE 900 MG/50ML IV SOLN
900.0000 mg | Freq: Once | INTRAVENOUS | Status: AC
  Administered 2012-07-17: 900 mg via INTRAVENOUS
  Filled 2012-07-17: qty 50

## 2012-07-17 MED ORDER — OXYCODONE HCL ER 20 MG PO T12A
20.0000 mg | EXTENDED_RELEASE_TABLET | Freq: Every day | ORAL | Status: DC
  Administered 2012-07-17 – 2012-07-18 (×2): 20 mg via ORAL
  Filled 2012-07-17 (×2): qty 1

## 2012-07-17 MED ORDER — POTASSIUM CHLORIDE CRYS ER 20 MEQ PO TBCR
40.0000 meq | EXTENDED_RELEASE_TABLET | Freq: Once | ORAL | Status: AC
  Administered 2012-07-17: 40 meq via ORAL
  Filled 2012-07-17: qty 2

## 2012-07-17 MED ORDER — IRBESARTAN 150 MG PO TABS
150.0000 mg | ORAL_TABLET | Freq: Every day | ORAL | Status: DC
  Administered 2012-07-18 – 2012-07-19 (×2): 150 mg via ORAL
  Filled 2012-07-17 (×2): qty 1

## 2012-07-17 MED ORDER — CLINDAMYCIN PHOSPHATE 900 MG/50ML IV SOLN
900.0000 mg | Freq: Three times a day (TID) | INTRAVENOUS | Status: DC
  Administered 2012-07-17 – 2012-07-19 (×6): 900 mg via INTRAVENOUS
  Filled 2012-07-17 (×7): qty 50

## 2012-07-17 MED ORDER — SODIUM CHLORIDE 0.9 % IJ SOLN
3.0000 mL | Freq: Two times a day (BID) | INTRAMUSCULAR | Status: DC
  Administered 2012-07-18 – 2012-07-19 (×2): 3 mL via INTRAVENOUS

## 2012-07-17 MED ORDER — SODIUM CHLORIDE 0.9 % IJ SOLN: 3.0000 mL | INTRAMUSCULAR | Status: DC | PRN

## 2012-07-17 MED ORDER — ACETAMINOPHEN 325 MG PO TABS: 650.0000 mg | ORAL_TABLET | Freq: Four times a day (QID) | ORAL | Status: DC | PRN

## 2012-07-17 MED ORDER — GABAPENTIN 100 MG PO CAPS
100.0000 mg | ORAL_CAPSULE | Freq: Two times a day (BID) | ORAL | Status: DC
  Administered 2012-07-17 – 2012-07-19 (×4): 100 mg via ORAL
  Filled 2012-07-17 (×5): qty 1

## 2012-07-17 MED ORDER — ACETAMINOPHEN 650 MG RE SUPP: 650.0000 mg | Freq: Four times a day (QID) | RECTAL | Status: DC | PRN

## 2012-07-17 MED ORDER — SODIUM CHLORIDE 0.9 % IV SOLN: 250.0000 mL | INTRAVENOUS | Status: DC | PRN

## 2012-07-17 MED ORDER — BISACODYL 5 MG PO TBEC: 5.0000 mg | DELAYED_RELEASE_TABLET | Freq: Every day | ORAL | Status: DC | PRN

## 2012-07-17 MED ORDER — DOCUSATE SODIUM 100 MG PO CAPS
100.0000 mg | ORAL_CAPSULE | Freq: Two times a day (BID) | ORAL | Status: DC | PRN
Start: 2012-07-17 — End: 2012-07-19
  Filled 2012-07-17: qty 1

## 2012-07-17 MED ORDER — OXYCODONE HCL 20 MG PO TB12: 20.0000 mg | ORAL_TABLET | Freq: Every day | ORAL | Status: DC

## 2012-07-17 MED ORDER — ONDANSETRON HCL 4 MG/2ML IJ SOLN: 4.0000 mg | Freq: Four times a day (QID) | INTRAMUSCULAR | Status: DC | PRN

## 2012-07-17 MED ORDER — TEMAZEPAM 15 MG PO CAPS: 15.0000 mg | ORAL_CAPSULE | Freq: Every evening | ORAL | Status: DC | PRN

## 2012-07-17 MED ORDER — HEPARIN SODIUM (PORCINE) 5000 UNIT/ML IJ SOLN
5000.0000 [IU] | Freq: Three times a day (TID) | INTRAMUSCULAR | Status: DC
  Administered 2012-07-17 – 2012-07-19 (×5): 5000 [IU] via SUBCUTANEOUS
  Filled 2012-07-17 (×9): qty 1

## 2012-07-17 MED ORDER — ONDANSETRON HCL 4 MG PO TABS: 4.0000 mg | ORAL_TABLET | Freq: Four times a day (QID) | ORAL | Status: DC | PRN

## 2012-07-17 NOTE — ED Notes (Signed)
Pt ambulated to [facility]

## 2012-07-17 NOTE — ED Notes (Signed)
Admitting MD at bedside.

## 2012-07-17 NOTE — H&P (Addendum)
[facility] History and Physical  GEOVANA Jamie Munoz:[id number] DOB: 1927-04-25 DOA: 07/17/2012  Referring physician: Dr. Lorenso Courier PCP: [name], MD  Specialists: sees for oncology Dr. Arbutus Ped  Chief Complaint: LLE cellulitis  HPI: Jamie Munoz is a 79 y.o. female  With history of lung cancer with recent completion of chemotherapy on 06/25/12.  States that since the 07/04/12 she noticed some redness in her LLE.  The problem has been persistent and getting worse.  It has spread and is currently painful to the touch, warmer than the right leg, and more edematous. Patient reportedly was started on keflex since the 07/04/12 and despite addition of other antibiotics (one time iv dose of levaquin and oral cipro) patient's condition got worse.    While in the Ed patient's WBC was wnl at 4.9 and given erythema and physical exam of LLE as well as failed outpatient antibiotic oral therapy, we were contacted for further evaluation and recommendations for admission. In ED patient was given IV clindamycin and no blood cultures were obtained.  Review of Systems: Reviewed and otherwise negative unless mentioned above.  Past Medical History  Diagnosis Date  . Cancer   . Lung cancer   . Hypertension   . Cellulitis of left lower extremity 07/16/2012   Past Surgical History  Procedure Date  . Cholecystectomy   . Abdominal hysterectomy   . Breast surgery   . Vaginal sling    Social History:  reports that she has never smoked. She has never used smokeless tobacco. She reports that she does not drink alcohol or use illicit drugs. Can patient participate in ADLs?yes  Allergies  Allergen Reactions  . Codeine Shortness Of Breath    Family History  Problem Relation Age of Onset  . Cancer Mother   . Heart attack Father   . Cancer Brother   . Cancer Brother   . Cancer Brother    None other reported Prior to Admission medications   Medication Sig Start Date End Date Taking? Authorizing  Provider  ALPRAZolam (XANAX) 0.25 MG tablet Take 0.25 mg by mouth at bedtime as needed.   Yes Historical Provider, MD  Alum & Mag Hydroxide-Simeth (MAGIC MOUTHWASH) SOLN Take 5 mLs by mouth 4 (four) times daily as needed. 03/14/12  Yes Si Gaul, MD  bisacodyl (DULCOLAX) 5 MG EC tablet Take 5 mg by mouth daily as needed.   Yes Historical Provider, MD  cephALEXin (KEFLEX) 500 MG capsule Take 500 mg by mouth 2 (two) times daily. Pt alternates between cipro and keflex daily. For 10 days. On day 8 07/04/12  Yes Historical Provider, MD  ciprofloxacin (CIPRO) 500 MG tablet Take 500 mg by mouth Twice daily. Alternates between cipro and kelfex daily.on day 7 of therapy 07/09/12  Yes Historical Provider, MD  clindamycin (CLEOCIN T) 1 % lotion APPLY TOPICALLY 2 TIMES A DAY 07/02/12  Yes Conni Slipper, PA  dexamethasone (DECADRON) 4 MG tablet Take 4 mg by mouth as directed. 1 tab BID day before of and after chemo 02/29/12  Yes Historical Provider, MD  docusate sodium (COLACE) 100 MG capsule Take 100 mg by mouth 2 (two) times daily.     Yes Historical Provider, MD  furosemide (LASIX) 20 MG tablet Take 20 mg by mouth Daily. 07/13/12  Yes Historical Provider, MD  gabapentin (NEURONTIN) 100 MG capsule  04/23/12  Yes Si Gaul, MD  lidocaine-prilocaine (EMLA) cream Apply topically as needed.     Yes Historical Provider, MD  olmesartan [facility])  20 MG tablet Take 20 mg by mouth daily.     Yes Historical Provider, MD  oxyCODONE (OXYCONTIN) 20 MG 12 hr tablet Take 20 mg by mouth at bedtime. 06/21/12  Yes Conni Slipper, PA  PRESCRIPTION MEDICATION Last treatment 06-29-12. Followed by Dr Gwenyth Bouillon   Yes Historical Provider, MD  prochlorperazine (COMPAZINE) 10 MG tablet TAKE (1) TABLET EVERY SIX HOURS AS NEEDED. 05/14/12  Yes Si Gaul, MD  temazepam (RESTORIL) 15 MG capsule Take 15 mg by mouth at bedtime as needed. 03/26/12  Yes Conni Slipper, PA   Physical Exam: Filed Vitals:   07/17/12 1023  BP:  134/60  Pulse: 80  Temp: 98.6 F (37 C)  TempSrc: Oral  Resp: 18  SpO2: 100%     General:  Pt in NAD, Alert and Awake  Eyes: EOMI, no icterus  ENT: normal exterior appearance, no masses on visual examination  Neck: supple, no goiter  Cardiovascular: RRR, No MRG  Respiratory: CTA BL, no wheezes  Abdomen: soft, NT, ND  Skin: warm and dry, LLE has cellulitis that extends from knee to dorsum of foot.  Pain on palpation at LLE as well as calor and rubor.  No induration or fluctuance  Musculoskeletal: no cyanosis or clubbing,   Psychiatric: mood and affect appropriate  Neurologic: moves all extremities responds to questions appropriately  Labs on Admission:  Basic Metabolic Panel:  Lab 07/17/12 1914 07/12/12 0950  NA 138 142  K 3.1* 3.4*  CL 100 104  CO2 26 26  GLUCOSE 98 92  BUN 17 13.0  CREATININE 1.05 1.0  CALCIUM 8.3* 8.2*  MG -- --  PHOS -- --   Liver Function Tests:  Lab 07/17/12 1040 07/12/12 0950  AST 22 22  ALT 10 12  ALKPHOS 57 57  BILITOT 0.6 0.64  PROT 6.6 6.5  ALBUMIN 3.3* 3.2*   No results found for this basename: LIPASE:5,AMYLASE:5 in the last 168 hours No results found for this basename: AMMONIA:5 in the last 168 hours CBC:  Lab 07/17/12 1040 07/12/12 0950  WBC 4.9 4.5  NEUTROABS 3.4 3.1  HGB 9.9* 10.3*  HCT 29.2* 29.3*  MCV 101.4* 99.7  PLT 162 115*   Cardiac Enzymes: No results found for this basename: CKTOTAL:5,CKMB:5,CKMBINDEX:5,TROPONINI:5 in the last 168 hours  BNP (last 3 results) No results found for this basename: PROBNP:3 in the last 8760 hours CBG: No results found for this basename: GLUCAP:5 in the last 168 hours  Radiological Exams on Admission: No results found.   Assessment/Plan Principal Problem:  *Cellulitis of left lower extremity Active Problems:  Lung cancer   1. Cellulitis of LLE - Will continue IV clindamycin as this has great coverage including for staph aureus and can be switched easily to oral  regimen if there is improvement in condition - check cbc - monitor vitals.  Currenly not toxic with VSS - Agree with doppler to rule out DVT although index of suspicion low for DVT as cause of presenting symptom  2. Lung cancer - To f/u with Dr. Arbutus Ped once discharged.  3. DVT prophylaxis - heparin.  Code Status: DNR Family Communication: Spoke with pt and family at bedside. Disposition Plan: 1-3 days pending improvement in condition  Time spent: > 55 minutes  Penny Pia [facility] Pager [phone]  If 7PM-7AM, please contact night-coverage [email or web address] Password [email or web address] 07/17/2012, 1:07 PM  Addendum:  Hypokalemia - replace orally and recheck level next am.

## 2012-07-17 NOTE — ED Notes (Signed)
Pt from home with reports of left leg swelling and redness that has worsened since 1/15. Pt endorses hx of lung cancer, diagnosed in March 2006 and takes chemo and radiation.

## 2012-07-17 NOTE — ED Notes (Signed)
Bed:WA01<BR> Expected date:<BR> Expected time:<BR> Means of arrival:<BR> Comments:<BR>

## 2012-07-17 NOTE — Progress Notes (Signed)
VASCULAR LAB PRELIMINARY  PRELIMINARY  PRELIMINARY  PRELIMINARY  Left lower extremity venous duplex completed.    Preliminary report:  Left:  No evidence of DVT, superficial thrombosis, or Baker's cyst.  Donyale Berthold, RVS 07/17/2012, 11:59 AM

## 2012-07-17 NOTE — ED Notes (Signed)
US at bedside

## 2012-07-17 NOTE — Progress Notes (Signed)
ANTIBIOTIC CONSULT NOTE - INITIAL  Pharmacy Consult for Clindamycin Indication: LLE Cellulitis  Allergies  Allergen Reactions  . Codeine Shortness Of Breath    Patient Measurements: Height: 5' 6.14" (168 cm) Weight: 140 lb 3.4 oz (63.6 kg) IBW/kg (Calculated) : 59.63   Vital Signs: Temp: 97.6 F (36.4 C) (01/28 1440) Temp src: Oral (01/28 1440) BP: 124/75 mmHg (01/28 1440) Pulse Rate: 78  (01/28 1440) Intake/Output from previous day:   Intake/Output from this shift: Total I/O In: -  Out: 150 [Urine:150]  Labs:  [facility] 07/17/12 1040  WBC 4.9  HGB 9.9*  PLT 162  LABCREA --  CREATININE 1.05   Estimated Creatinine Clearance: 38.2 ml/min (by C-G formula based on Cr of 1.05). No results found for this basename: VANCOTROUGH:2,VANCOPEAK:2,VANCORANDOM:2,GENTTROUGH:2,GENTPEAK:2,GENTRANDOM:2,TOBRATROUGH:2,TOBRAPEAK:2,TOBRARND:2,AMIKACINPEAK:2,AMIKACINTROU:2,AMIKACIN:2, in the last 72 hours   Microbiology: No results found for this or any previous visit (from the past 720 hour(s)).  Medical History: Past Medical History  Diagnosis Date  . Cancer   . Lung cancer   . Hypertension   . Cellulitis of left lower extremity 07/16/2012    Medications:  Scheduled:    . [COMPLETED] clindamycin (CLEOCIN) IV  900 mg Intravenous Once  . gabapentin  100 mg Oral BID  . heparin  5,000 Units Subcutaneous Q8H  . irbesartan  150 mg Oral Daily  . oxyCODONE  20 mg Oral QHS  . [COMPLETED] potassium chloride  40 mEq Oral Once  . sodium chloride  3 mL Intravenous Q12H   Infusions:   Assessment:  79 yr old female undergoing chemotherapy for lung cancer.  Redness in LLE since 07/04/12; keflex started 1/15 without improvement  IV Clindamycin to begin for treatment of LLE cellulitis.  Patient received Clindamycin 900mg  IV x 1 @ 11:49  No adjustment for clindamycin necessary for renal function  Goal of Therapy:  Eradication of infection  Plan:   Clindamycin 900mg  IV q8h  Follow  clinical symptoms and response to therapy  Muhamad Serano, Joselyn Glassman, PharmD 07/17/2012,4:40 PM

## 2012-07-17 NOTE — ED Provider Notes (Signed)
History     CSN: [id number]  Arrival date & time 07/17/12  0940   First MD Initiated Contact with Patient 07/17/12 1043      Chief Complaint  Patient presents with  . Leg Swelling    redness    (Consider location/radiation/quality/duration/timing/severity/associated sxs/prior treatment) HPI Comments: [name] presents with unilateral leg swelling.  She just completed a course of chemotherapy secondary to lung cancer.  She reports redness and swelling since 1/15.  She was seen by her cancer specialist and started antibiotics but the swelling and redness have progressed as opposed to improved.  She reports feeling otherwise well.  She denies any fevers, CP, SOB, NVD, and abdominal pain.  She reports only minimal discomfort in the extremity.  The history is provided by the patient. No language interpreter was used.    Past Medical History  Diagnosis Date  . Cancer   . Lung cancer   . Hypertension   . Cellulitis of left lower extremity 07/16/2012    Past Surgical History  Procedure Date  . Cholecystectomy   . Abdominal hysterectomy   . Breast surgery   . Vaginal sling     Family History  Problem Relation Age of Onset  . Cancer Mother   . Heart attack Father   . Cancer Brother   . Cancer Brother   . Cancer Brother     History  Substance Use Topics  . Smoking status: Never Smoker   . Smokeless tobacco: Never Used  . Alcohol Use: No    OB History    Grav Para Term Preterm Abortions TAB SAB Ect Mult Living                  Review of Systems  Allergies  Codeine  Home Medications   Current Outpatient Rx  Name  Route  Sig  Dispense  Refill  . ALPRAZOLAM 0.25 MG PO TABS   Oral   Take 0.25 mg by mouth at bedtime as needed.         Marland Kitchen MAGIC MOUTHWASH   Oral   Take 5 mLs by mouth 4 (four) times daily as needed.   120 mL   0   . BISACODYL 5 MG PO TBEC   Oral   Take 5 mg by mouth daily as needed.         . CEPHALEXIN 500 MG PO CAPS   Oral   Take  500 mg by mouth 2 (two) times daily. Pt alternates between cipro and keflex daily. For 10 days. On day 8         . CIPROFLOXACIN HCL 500 MG PO TABS   Oral   Take 500 mg by mouth Twice daily. Alternates between cipro and kelfex daily.on day 7 of therapy         . CLINDAMYCIN PHOSPHATE 1 % EX LOTN      APPLY TOPICALLY 2 TIMES A DAY   60 mL   2   . DEXAMETHASONE 4 MG PO TABS   Oral   Take 4 mg by mouth as directed. 1 tab BID day before of and after chemo         . DOCUSATE SODIUM 100 MG PO CAPS   Oral   Take 100 mg by mouth 2 (two) times daily.           . FUROSEMIDE 20 MG PO TABS   Oral   Take 20 mg by mouth Daily.         Marland Kitchen  GABAPENTIN 100 MG PO CAPS               . LIDOCAINE-PRILOCAINE 2.5-2.5 % EX CREA   Topical   Apply topically as needed.           Marland Kitchen OLMESARTAN MEDOXOMIL 20 MG PO TABS   Oral   Take 20 mg by mouth daily.           . OXYCODONE HCL ER 20 MG PO TB12   Oral   Take 20 mg by mouth at bedtime.         Marland Kitchen PRESCRIPTION MEDICATION      Last treatment 06-29-12. Followed by Dr Gwenyth Bouillon         . PROCHLORPERAZINE MALEATE 10 MG PO TABS      TAKE (1) TABLET EVERY SIX HOURS AS NEEDED.   60 tablet   0   . TEMAZEPAM 15 MG PO CAPS   Oral   Take 15 mg by mouth at bedtime as needed.           BP 134/60  Pulse 80  Temp 98.6 F (37 C) (Oral)  Resp 18  SpO2 100%  Physical Exam  Nursing note and vitals reviewed. Constitutional: She is oriented to person, place, and time. She appears well-developed and well-nourished. No distress.  HENT:  Head: Normocephalic and atraumatic.  Right Ear: External ear normal.  Left Ear: External ear normal.  Nose: Nose normal.  Mouth/Throat: Oropharynx is clear and moist. No oropharyngeal exudate.  Eyes: Conjunctivae normal are normal. Pupils are equal, round, and reactive to light. Right eye exhibits no discharge. Left eye exhibits no discharge. No scleral icterus.  Neck: Normal range of motion. Neck  supple. No JVD present. No tracheal deviation present.  Cardiovascular: Normal rate, regular rhythm, normal heart sounds and intact distal pulses.  Exam reveals no gallop and no friction rub.   No murmur heard. Pulmonary/Chest: Effort normal and breath sounds normal. No stridor. No respiratory distress. She has no wheezes. She has no rales. She exhibits no tenderness.  Abdominal: Soft. Bowel sounds are normal. She exhibits no distension and no mass. There is no tenderness. There is no rebound and no guarding.  Musculoskeletal: Normal range of motion. She exhibits edema (1 ++ left left foot up to distal knee.  trace right pedal.) and tenderness (very vague, diffuse left lowe leg).  Lymphadenopathy:    She has no cervical adenopathy.  Neurological: She is alert and oriented to person, place, and time. No cranial nerve deficit.  Skin: Skin is warm and dry. No rash noted. She is not diaphoretic. There is erythema. No pallor.       Note concentric edema and erythema of the left lower leg that extends down to the toes.  No wounds or ulcerations appreciated.  Psychiatric: She has a normal mood and affect. Her behavior is normal.    ED Course  Procedures (including critical care time)  Labs Reviewed  CBC WITH DIFFERENTIAL - Abnormal; Notable for the following:    RBC 2.88 (*)     Hemoglobin 9.9 (*)     HCT 29.2 (*)     MCV 101.4 (*)     MCH 34.4 (*)     RDW 18.6 (*)     Lymphocytes Relative 11 (*)     Lymphs Abs 0.6 (*)     Monocytes Relative 19 (*)     All other components within normal limits  COMPREHENSIVE METABOLIC PANEL   No results  found.   No diagnosis found.    MDM  Pt presents for evaluation of left lower leg swelling and redness.  She appears nontoxic, afebrile, note stable VS, NAD.  She has erythema that extends from the lower knee down to the dorsum of the toes.  There is associated edema.  She is immunocompromised and has been taking keflex and cipro with 1 IV dose of  levaquin, but the erythema and swelling have worsened instead of improved.  Will not obtain blood cultures because she has no evidence of systemic infection.  She has intact DP and PT pulses.  Will administer clindamycin and obtain basic labs and a lower extremity ultrasound.  As results become available, will consult the hospitalist service for admission.   1255.  Pt stable, NAD.  Note stable anemia.  She has no leukocytosis or leukocytopenia.  There is mild hypokalemia.  Initiated repletion with po potassium.  Discussed with the on-call hospitalist.  She will be admitted for further mgmnt.     Tobin Chad, MD 07/17/12 1259

## 2012-07-18 DIAGNOSIS — N179 Acute kidney failure, unspecified: Secondary | ICD-10-CM | POA: Diagnosis present

## 2012-07-18 DIAGNOSIS — D539 Nutritional anemia, unspecified: Secondary | ICD-10-CM | POA: Diagnosis present

## 2012-07-18 LAB — BASIC METABOLIC PANEL
CO2: 25 mEq/L (ref 19–32)
Calcium: 8 mg/dL — ABNORMAL LOW (ref 8.4–10.5)
Creatinine, Ser: 1.11 mg/dL — ABNORMAL HIGH (ref 0.50–1.10)
Glucose, Bld: 83 mg/dL (ref 70–99)
Sodium: 137 mEq/L (ref 135–145)

## 2012-07-18 LAB — CBC
MCH: 34.3 pg — ABNORMAL HIGH (ref 26.0–34.0)
Platelets: 153 10*3/uL (ref 150–400)
RBC: 2.77 MIL/uL — ABNORMAL LOW (ref 3.87–5.11)

## 2012-07-18 NOTE — Progress Notes (Signed)
[facility] PROGRESS NOTE  Jamie Munoz ZOX:[id number] DOB: 06/27/1926 DOA: 07/17/2012 PCP: Jamie Hoit, MD  Brief narrative: Jamie Munoz is an 79 year old woman with a past medical history of lung cancer, actively receiving chemotherapy under the care of Jamie Munoz, who was admitted on 07/17/2012 with left lower extremity cellulitis after having failed outpatient antibiotics in (treated with a one-time dose of IV Levaquin followed by oral Cipro).  Assessment/Plan: Principal Problem:  *Cellulitis of left lower extremity  Admitted and placed on IV clindamycin.  DVT ruled out with negative Dopplers. Active Problems:  Lung cancer  Stable disease per Jamie Munoz. Followup with him post discharge.  Hypokalemia  Repleted. Macrocytic anemia  Likely anemia of chronic disease. Check B12 and folic acid levels. No indication for transfusion.  Code Status: DNR Family Communication: None at bedside. Disposition Plan: Home when stable.   Medical Consultants:  None.  Other Consultants:  None.  Anti-infectives:  Clindamycin 07/17/2012--->  HPI/Subjective: Jamie Munoz feels that her left lower extremity is improving. It is less painful. No nausea, vomiting or diarrhea. Her bowels move twice in the past 24 hours.  Objective: Filed Vitals:   07/17/12 1440 07/17/12 1639 07/17/12 2245 07/18/12 0520  BP: 124/75  133/51 123/46  Pulse: 78  88 75  Temp: 97.6 F (36.4 C)  98.4 F (36.9 C) 98.1 F (36.7 C)  TempSrc: Oral  Oral Oral  Resp: 18  20 20   Height:  5' 6.14" (1.68 m)    Weight:  63.6 kg (140 lb 3.4 oz)    SpO2: 99%  100% 98%    Intake/Output Summary (Last 24 hours) at 07/18/12 1230 Last data filed at 07/18/12 8119  Gross per 24 hour  Intake    700 ml  Output    550 ml  Net    150 ml    Exam: Gen:  NAD Cardiovascular:  RRR, No M/R/G Respiratory:  Lungs CTAB Gastrointestinal:  Abdomen soft, NT/ND, + BS Extremities:  Left lower extremity markedly swollen  compared to the right, and erythematous from toes to knee.  Data Reviewed: Basic Metabolic Panel:  Lab 07/18/12 1478 07/17/12 1040 07/12/12 0950  NA 137 138 142  K 3.6 3.1* --  CL 102 100 104  CO2 25 26 26   GLUCOSE 83 98 92  BUN 19 17 13.0  CREATININE 1.11* 1.05 1.0  CALCIUM 8.0* 8.3* 8.2*  MG -- -- --  PHOS -- -- --   GFR Estimated Creatinine Clearance: 36.1 ml/min (by C-G formula based on Cr of 1.11). Liver Function Tests:  Lab 07/17/12 1040 07/12/12 0950  AST 22 22  ALT 10 12  ALKPHOS 57 57  BILITOT 0.6 0.64  PROT 6.6 6.5  ALBUMIN 3.3* 3.2*   CBC:  Lab 07/18/12 0600 07/17/12 1040 07/12/12 0950  WBC 3.4* 4.9 4.5  NEUTROABS -- 3.4 3.1  HGB 9.5* 9.9* 10.3*  HCT 28.0* 29.2* 29.3*  MCV 101.1* 101.4* 99.7  PLT 153 162 115*   Microbiology No results found for this or any previous visit (from the past 240 hour(s)).   Procedures and Diagnostic Studies:  Left lower extremity venous duplex 07/17/2012: No evidence of DVT, superficial thrombosis or Baker's cyst.  Scheduled Meds:   . clindamycin (CLEOCIN) IV  900 mg Intravenous Q8H  . gabapentin  100 mg Oral BID  . heparin  5,000 Units Subcutaneous Q8H  . irbesartan  150 mg Oral Daily  . OxyCODONE  20 mg Oral QHS  . sodium chloride  3 mL Intravenous Q12H   Continuous Infusions:   Time spent: 25 minutes.   LOS: 1 day   Jamie Munoz  [facility] Pager [phone].  If 8PM-8AM, please contact night-coverage at [email or web address], password [email or web address] 07/18/2012, 12:30 PM

## 2012-07-19 ENCOUNTER — Other Ambulatory Visit: Payer: Medicare Other | Admitting: Lab

## 2012-07-19 LAB — BASIC METABOLIC PANEL
CO2: 24 mEq/L (ref 19–32)
Chloride: 103 mEq/L (ref 96–112)
Creatinine, Ser: 1.11 mg/dL — ABNORMAL HIGH (ref 0.50–1.10)

## 2012-07-19 LAB — CBC
HCT: 28.8 % — ABNORMAL LOW (ref 36.0–46.0)
MCV: 101.1 fL — ABNORMAL HIGH (ref 78.0–100.0)
RDW: 18.4 % — ABNORMAL HIGH (ref 11.5–15.5)
WBC: 3.5 10*3/uL — ABNORMAL LOW (ref 4.0–10.5)

## 2012-07-19 MED ORDER — CLINDAMYCIN HCL 300 MG PO CAPS: 600.0000 mg | ORAL_CAPSULE | Freq: Three times a day (TID) | ORAL | Status: DC

## 2012-07-19 MED ORDER — POTASSIUM CHLORIDE CRYS ER 20 MEQ PO TBCR
40.0000 meq | EXTENDED_RELEASE_TABLET | Freq: Once | ORAL | Status: DC
  Filled 2012-07-19: qty 2

## 2012-07-19 MED ORDER — HEPARIN SOD (PORK) LOCK FLUSH 100 UNIT/ML IV SOLN
INTRAVENOUS | Status: AC
  Filled 2012-07-19: qty 5

## 2012-07-19 NOTE — Discharge Summary (Addendum)
Physician Discharge Summary  Jamie Munoz NWG:[id number] DOB: 1927/02/01 DOA: 07/17/2012  PCP: Jamie Hoit, MD  Admit date: 07/17/2012 Discharge date: 07/19/2012  Recommendations for Outpatient Follow-up:  1. Followup with your PCP in one week to recheck your left lower extremity for resolution of cellulitis. 2. Recommend recheck of potassium levels and creatinine at next visit. 3. Followup RBC folate levels, which are pending at the time of this dictation.  Discharge Diagnoses:  Principal Problem:  *Cellulitis of left lower extremity Active Problems:   Lung cancer   Hypokalemia   Acute kidney injury   Macrocytic anemia   Discharge Condition: Improved.  Diet recommendation: Low-sodium, heart healthy.  History of present illness:  Mrs. Manner is an 79 year old woman with a past medical history of lung cancer, actively receiving chemotherapy under the care of Jamie Munoz, who was admitted on 07/17/2012 with left lower extremity cellulitis after having failed outpatient antibiotics in (treated with a one-time dose of IV Levaquin followed by oral Cipro alternating with Keflex).   Hospital Course by problem:  Principal Problem:  *Cellulitis of left lower extremity  Admitted and placed on IV clindamycin.  DVT ruled out with negative Dopplers. Active Problems:  Acute kidney injury  Mild. Recommend followup with primary care physician to recheck creatinine in one week.  Lung cancer  Stable disease per Jamie Munoz. Followup with him post discharge. Hypokalemia  Repleted. Macrocytic anemia  Likely anemia of chronic disease. B12 was normal at 301. Followup folic acid levels. No indication for transfusion.  Procedures:  None.  Consultations:  Left lower extremity venous duplex done 07/17/2012: No evidence of DVT, superficial thrombosis or Baker's cyst.  Discharge Exam: Filed Vitals:   07/19/12 0517  BP: 121/43  Pulse: 68  Temp: 98.1 F (36.7 C)  Resp: 16    Filed Vitals:   07/18/12 0520 07/18/12 1416 07/18/12 2254 07/19/12 0517  BP: 123/46 113/55 114/76 121/43  Pulse: 75 75 73 68  Temp: 98.1 F (36.7 C) 98.5 F (36.9 C) 98.8 F (37.1 C) 98.1 F (36.7 C)  TempSrc: Oral Oral Oral Oral  Resp: 20 18 16 16   Height:      Weight:      SpO2: 98% 97% 97% 100%    Gen:  NAD Cardiovascular:  RRR, No M/R/G Respiratory: Lungs CTAB Gastrointestinal: Abdomen soft, NT/ND with normal active bowel sounds. Extremities: Left lower extremity swollen and erythematous from toes to knee, beginning to regress from inked margins.   Discharge Instructions      Discharge Orders    Future Appointments: Provider: Department: Dept Phone: Center:   07/30/2012 2:15 PM Jamie Munoz [facility] MED[facility] [phone] None   07/30/2012 2:45 PM Jamie Munoz, Jamie Munoz [facility] [phone] None   07/30/2012 3:45 PM [facility] Mun[facility] [phone] None   08/20/2012 1:00 PM Jamie Munoz [facility] [phone] None     Future Orders Please Complete By Expires   Diet - low sodium heart healthy      Increase activity slowly      Call MD for:  temperature >100.4      Call MD for:  severe uncontrolled pain          Medication List     As of 07/19/2012  1:36 PM    STOP taking these medications         cephALEXin 500 MG capsule   Commonly known  as: KEFLEX      ciprofloxacin 500 MG tablet   Commonly known as: CIPRO      dexamethasone 4 MG tablet   Commonly known as: DECADRON      TAKE these medications         ALPRAZolam 0.25 MG tablet   Commonly known as: XANAX   Take 0.25 mg by mouth at bedtime as needed.      bisacodyl 5 MG EC tablet   Commonly known as: DULCOLAX   Take 5 mg by mouth daily as needed.      clindamycin 1 % lotion   Commonly known as: CLEOCIN T   APPLY TOPICALLY 2 TIMES A DAY      clindamycin 300 MG  capsule   Commonly known as: CLEOCIN   Take 2 capsules (600 mg total) by mouth 3 (three) times daily.      docusate sodium 100 MG capsule   Commonly known as: COLACE   Take 100 mg by mouth 2 (two) times daily.      furosemide 20 MG tablet   Commonly known as: LASIX   Take 20 mg by mouth Daily.      gabapentin 100 MG capsule   Commonly known as: NEURONTIN      lidocaine-prilocaine cream   Commonly known as: EMLA   Apply topically as needed.      magic mouthwash Soln   Take 5 mLs by mouth 4 (four) times daily as needed.      olmesartan 20 MG tablet   Commonly known as: BENICAR   Take 20 mg by mouth daily.      oxyCODONE 20 MG 12 hr tablet   Commonly known as: OXYCONTIN   Take 20 mg by mouth at bedtime.      PRESCRIPTION MEDICATION   Last treatment 06-29-12. Followed by Dr Jamie Munoz      prochlorperazine 10 MG tablet   Commonly known as: COMPAZINE   TAKE (1) TABLET EVERY SIX HOURS AS NEEDED.      temazepam 15 MG capsule   Commonly known as: RESTORIL   Take 15 mg by mouth at bedtime as needed.        Follow-up Information    Follow up with Jamie Hoit, MD. In 1 week. (To follow up and check skin infection.)    Contact information:   [address] [place] [address] [phone]       Follow up with Jamie Munoz., MD. (At your scheduled appt times noted below.)    Contact information:   [address] [place] [address] [phone]           The results of significant diagnostics from this hospitalization (including imaging, microbiology, ancillary and laboratory) are listed below for reference.    Significant Diagnostic Studies: Ct Chest W Contrast  07/12/2012  *RADIOLOGY REPORT*  Clinical Data:  Restaging lung cancer.  CT CHEST, ABDOMEN AND PELVIS WITH CONTRAST  Technique:  Multidetector CT imaging of the chest, abdomen and pelvis was performed following the standard protocol during bolus administration of intravenous contrast.   Contrast: OMNIPAQUE IOHEXOL 300 MG/ML  SOLN  Comparison:  CT 05/08/2012, PET CT 11/26/2010  CT CHEST  Findings:  There is a port in the right anterior chest wall.  No axillary or supraclavicular lymphadenopathy.  No mediastinal or hilar lymphadenopathy.  There is gas and fluid in  the esophagus. No pericardial fluid.  Review of the lung parenchyma demonstrates a rounded right upper  lobe nodule measuring 26 x 25 mm (image 18, series 4) compared to 26 x 24 mm on prior for no significant change. This nodule was hypermetabolic on comparison PET CT scan.  There is linear thickening superior to this lesion which is also unchanged.  There is bibasilar nodular pleural thickening  IMPRESSION:  1.  Stable round right upper lobe 2.5 cm nodule.  2.  No evidence of disease progression.  CT ABDOMEN AND PELVIS  Findings:  There is no focal hepatic lesion.  There is persistent biliary ductal dilatation the common bile duct which is unchanged compared to prior.  The patient is post cholecystectomy. The pancreas, spleen, adrenal glands, and kidneys are normal.  The stomach, small bowel, and colon are normal.  Abdominal aorta normal caliber.  No retroperitoneal periportal lymphadenopathy.  No free fluid the pelvis.   No pelvic lymphadenopathy.  There is diverticular disease as sigmoid colon.  The skeleton demonstrates a rounded sclerotic lesion in the L1 vertebral body unchanged compared to prior. Sclerotic changes at L3 with volume loss is also unchanged. These lesions were not hypermetabolic on comparison PET CT scan.  IMPRESSION:  1.  No evidence metastasis in the abdomen pelvis. 2.  Stable sclerotic lesion in the lumbar spine which were not hypermetabolic on comparison PET CT scan and likely represent treated metastasis.   Original Report Authenticated By: Genevive Bi, M.D.    Ct Abdomen Pelvis W Contrast  07/12/2012  *RADIOLOGY REPORT*  Clinical Data:  Restaging lung cancer.  CT CHEST, ABDOMEN AND PELVIS WITH  CONTRAST  Technique:  Multidetector CT imaging of the chest, abdomen and pelvis was performed following the standard protocol during bolus administration of intravenous contrast.  Contrast: OMNIPAQUE IOHEXOL 300 MG/ML  SOLN  Comparison:  CT 05/08/2012, PET CT 11/26/2010  CT CHEST  Findings:  There is a port in the right anterior chest wall.  No axillary or supraclavicular lymphadenopathy.  No mediastinal or hilar lymphadenopathy.  There is gas and fluid in  the esophagus. No pericardial fluid.  Review of the lung parenchyma demonstrates a rounded right upper lobe nodule measuring 26 x 25 mm (image 18, series 4) compared to 26 x 24 mm on prior for no significant change. This nodule was hypermetabolic on comparison PET CT scan.  There is linear thickening superior to this lesion which is also unchanged.  There is bibasilar nodular pleural thickening  IMPRESSION:  1.  Stable round right upper lobe 2.5 cm nodule.  2.  No evidence of disease progression.  CT ABDOMEN AND PELVIS  Findings:  There is no focal hepatic lesion.  There is persistent biliary ductal dilatation the common bile duct which is unchanged compared to prior.  The patient is post cholecystectomy. The pancreas, spleen, adrenal glands, and kidneys are normal.  The stomach, small bowel, and colon are normal.  Abdominal aorta normal caliber.  No retroperitoneal periportal lymphadenopathy.  No free fluid the pelvis.   No pelvic lymphadenopathy.  There is diverticular disease as sigmoid colon.  The skeleton demonstrates a rounded sclerotic lesion in the L1 vertebral body unchanged compared to prior. Sclerotic changes at L3 with volume loss is also unchanged. These lesions were not hypermetabolic on comparison PET CT scan.  IMPRESSION:  1.  No evidence metastasis in the abdomen pelvis. 2.  Stable sclerotic lesion in the lumbar spine which were not hypermetabolic on comparison PET CT scan and likely represent treated metastasis.   Original Report  Authenticated By: Genevive Bi, M.D.  Microbiology: No results found for this or any previous visit (from the past 240 hour(s)).   Labs:  Basic Metabolic Panel:  Lab 07/19/12 1610 07/18/12 0600 07/17/12 1040  NA 138 137 138  K 3.4* 3.6 --  CL 103 102 100  CO2 24 25 26   GLUCOSE 86 83 98  BUN 20 19 17   CREATININE 1.11* 1.11* 1.05  CALCIUM 7.9* 8.0* 8.3*  MG -- -- --  PHOS -- -- --   GFR Estimated Creatinine Clearance: 36.1 ml/min (by C-G formula based on Cr of 1.11). Liver Function Tests:  Lab 07/17/12 1040  AST 22  ALT 10  ALKPHOS 57  BILITOT 0.6  PROT 6.6  ALBUMIN 3.3*   CBC:  Lab 07/19/12 0603 07/18/12 0600 07/17/12 1040  WBC 3.5* 3.4* 4.9  NEUTROABS -- -- 3.4  HGB 9.7* 9.5* 9.9*  HCT 28.8* 28.0* 29.2*  MCV 101.1* 101.1* 101.4*  PLT 161 153 162   Anemia work up  [employer] 07/19/12 0603  VITAMINB12 301  FOLATE --  FERRITIN --  TIBC --  IRON --  RETICCTPCT --   Microbiology No results found for this or any previous visit (from the past 240 hour(s)).  Time coordinating discharge: 35 minutes.  Signed:  Alannie Amodio  Pager [phone] [facility] 07/19/2012, 1:36 PM

## 2012-07-20 LAB — FOLATE RBC: RBC Folate: 948 ng/mL — ABNORMAL HIGH (ref >=366)

## 2012-07-26 ENCOUNTER — Other Ambulatory Visit: Payer: Self-pay | Admitting: Medical Oncology

## 2012-07-26 MED ORDER — DEXAMETHASONE 4 MG PO TABS: ORAL_TABLET | ORAL | Status: DC

## 2012-07-26 NOTE — Telephone Encounter (Signed)
Refill called in to [facility] drug for decadron-pt notified

## 2012-07-30 ENCOUNTER — Ambulatory Visit (HOSPITAL_BASED_OUTPATIENT_CLINIC_OR_DEPARTMENT_OTHER): Payer: Medicare Other | Admitting: Physician Assistant

## 2012-07-30 ENCOUNTER — Ambulatory Visit (HOSPITAL_BASED_OUTPATIENT_CLINIC_OR_DEPARTMENT_OTHER): Payer: Medicare Other

## 2012-07-30 ENCOUNTER — Telehealth: Payer: Self-pay | Admitting: Internal Medicine

## 2012-07-30 ENCOUNTER — Other Ambulatory Visit (HOSPITAL_BASED_OUTPATIENT_CLINIC_OR_DEPARTMENT_OTHER): Payer: Medicare Other | Admitting: Lab

## 2012-07-30 VITALS — BP 158/62 | HR 72 | Temp 97.8°F | Resp 18 | Ht 66.0 in | Wt 140.4 lb

## 2012-07-30 DIAGNOSIS — C3490 Malignant neoplasm of unspecified part of unspecified bronchus or lung: Secondary | ICD-10-CM

## 2012-07-30 DIAGNOSIS — C7951 Secondary malignant neoplasm of bone: Secondary | ICD-10-CM

## 2012-07-30 DIAGNOSIS — Z5111 Encounter for antineoplastic chemotherapy: Secondary | ICD-10-CM

## 2012-07-30 DIAGNOSIS — C3410 Malignant neoplasm of upper lobe, unspecified bronchus or lung: Secondary | ICD-10-CM

## 2012-07-30 DIAGNOSIS — C7952 Secondary malignant neoplasm of bone marrow: Secondary | ICD-10-CM

## 2012-07-30 LAB — CBC WITH DIFFERENTIAL/PLATELET
Basophils Absolute: 0 10*3/uL (ref 0.0–0.1)
EOS%: 0.1 % (ref 0.0–7.0)
HCT: 29.5 % — ABNORMAL LOW (ref 34.8–46.6)
HGB: 9.8 g/dL — ABNORMAL LOW (ref 11.6–15.9)
LYMPH%: 9.7 % — ABNORMAL LOW (ref 14.0–49.7)
MCH: 33.9 pg (ref 25.1–34.0)
MCV: 102.1 fL — ABNORMAL HIGH (ref 79.5–101.0)
MONO%: 5.8 % (ref 0.0–14.0)
NEUT%: 84.3 % — ABNORMAL HIGH (ref 38.4–76.8)
Platelets: 180 10*3/uL (ref 145–400)
RDW: 18.3 % — ABNORMAL HIGH (ref 11.2–14.5)

## 2012-07-30 LAB — COMPREHENSIVE METABOLIC PANEL (CC13)
AST: 23 U/L (ref 5–34)
Alkaline Phosphatase: 69 U/L (ref 40–150)
BUN: 19.4 mg/dL (ref 7.0–26.0)
Creatinine: 1.1 mg/dL (ref 0.6–1.1)
Total Bilirubin: 0.37 mg/dL (ref 0.20–1.20)

## 2012-07-30 MED ORDER — SODIUM CHLORIDE 0.9 % IJ SOLN
10.0000 mL | INTRAMUSCULAR | Status: DC | PRN
  Administered 2012-07-30: 10 mL
  Filled 2012-07-30: qty 10

## 2012-07-30 MED ORDER — ONDANSETRON 8 MG/50ML IVPB (CHCC)
8.0000 mg | Freq: Once | INTRAVENOUS | Status: AC
  Administered 2012-07-30: 8 mg via INTRAVENOUS

## 2012-07-30 MED ORDER — DEXAMETHASONE SODIUM PHOSPHATE 10 MG/ML IJ SOLN
10.0000 mg | Freq: Once | INTRAMUSCULAR | Status: AC
  Administered 2012-07-30: 10 mg via INTRAVENOUS

## 2012-07-30 MED ORDER — SODIUM CHLORIDE 0.9 % IV SOLN
Freq: Once | INTRAVENOUS | Status: AC
  Administered 2012-07-30: 16:00:00 via INTRAVENOUS

## 2012-07-30 MED ORDER — OXYCODONE HCL 20 MG PO TB12: 20.0000 mg | ORAL_TABLET | Freq: Every day | ORAL | Status: DC

## 2012-07-30 MED ORDER — HEPARIN SOD (PORK) LOCK FLUSH 100 UNIT/ML IV SOLN
500.0000 [IU] | Freq: Once | INTRAVENOUS | Status: AC | PRN
  Administered 2012-07-30: 500 [IU]
  Filled 2012-07-30: qty 5

## 2012-07-30 MED ORDER — SODIUM CHLORIDE 0.9 % IV SOLN
500.0000 mg/m2 | Freq: Once | INTRAVENOUS | Status: AC
  Administered 2012-07-30: 850 mg via INTRAVENOUS
  Filled 2012-07-30: qty 34

## 2012-07-30 NOTE — Patient Instructions (Signed)
Pemetrexed injection What is this medicine? PEMETREXED (PEM e TREX ed) is a chemotherapy drug. This medicine affects cells that are rapidly growing, such as cancer cells and cells in your mouth and stomach. It is usually used to treat lung cancers like non-small cell lung cancer and mesothelioma. It may also be used to treat other cancers. This medicine may be used for other purposes; ask your health care provider or pharmacist if you have questions. What should I tell my health care provider before I take this medicine? They need to know if you have any of these conditions: -if you frequently drink alcohol containing beverages -infection (especially a virus infection such as chickenpox, cold sores, or herpes) -kidney disease -liver disease -low blood counts, like low platelets, red bloods, or white blood cells -an unusual or allergic reaction to pemetrexed, mannitol, other medicines, foods, dyes, or preservatives -pregnant or trying to get pregnant -breast-feeding How should I use this medicine? This drug is given as an infusion into a vein. It is administered in a hospital or clinic by a specially trained health care professional. Talk to your pediatrician regarding the use of this medicine in children. Special care may be needed. Overdosage: If you think you have taken too much of this medicine contact a poison control center or emergency room at once. NOTE: This medicine is only for you. Do not share this medicine with others. What if I miss a dose? It is important not to miss your dose. Call your doctor or health care professional if you are unable to keep an appointment. What may interact with this medicine? -aspirin and aspirin-like medicines -medicines to increase blood counts like filgrastim, pegfilgrastim, sargramostim -methotrexate -NSAIDS, medicines for pain and inflammation, like ibuprofen or naproxen -probenecid -pyrimethamine -vaccines Talk to your doctor or health care  professional before taking any of these medicines: -acetaminophen -aspirin -ibuprofen -ketoprofen -naproxen This list may not describe all possible interactions. Give your health care provider a list of all the medicines, herbs, non-prescription drugs, or dietary supplements you use. Also tell them if you smoke, drink alcohol, or use illegal drugs. Some items may interact with your medicine. What should I watch for while using this medicine? Visit your doctor for checks on your progress. This drug may make you feel generally unwell. This is not uncommon, as chemotherapy can affect healthy cells as well as cancer cells. Report any side effects. Continue your course of treatment even though you feel ill unless your doctor tells you to stop. In some cases, you may be given additional medicines to help with side effects. Follow all directions for their use. Call your doctor or health care professional for advice if you get a fever, chills or sore throat, or other symptoms of a cold or flu. Do not treat yourself. This drug decreases your body's ability to fight infections. Try to avoid being around people who are sick. This medicine may increase your risk to bruise or bleed. Call your doctor or health care professional if you notice any unusual bleeding. Be careful brushing and flossing your teeth or using a toothpick because you may get an infection or bleed more easily. If you have any dental work done, tell your dentist you are receiving this medicine. Avoid taking products that contain aspirin, acetaminophen, ibuprofen, naproxen, or ketoprofen unless instructed by your doctor. These medicines may hide a fever. Call your doctor or health care professional if you get diarrhea or mouth sores. Do not treat yourself. To protect your  kidneys, drink water or other fluids as directed while you are taking this medicine. Men and women must use effective birth control while taking this medicine. You may also  need to continue using effective birth control for a time after stopping this medicine. Do not become pregnant while taking this medicine. Tell your doctor right away if you think that you or your partner might be pregnant. There is a potential for serious side effects to an unborn child. Talk to your health care professional or pharmacist for more information. Do not breast-feed an infant while taking this medicine. This medicine may lower sperm counts. What side effects may I notice from receiving this medicine? Side effects that you should report to your doctor or health care professional as soon as possible: -allergic reactions like skin rash, itching or hives, swelling of the face, lips, or tongue -low blood counts - this medicine may decrease the number of white blood cells, red blood cells and platelets. You may be at increased risk for infections and bleeding. -signs of infection - fever or chills, cough, sore throat, pain or difficulty passing urine -signs of decreased platelets or bleeding - bruising, pinpoint red spots on the skin, black, tarry stools, blood in the urine -signs of decreased red blood cells - unusually weak or tired, fainting spells, lightheadedness -breathing problems, like a dry cough -changes in emotions or moods -chest pain -confusion -diarrhea -high blood pressure -mouth or throat sores or ulcers -pain, swelling, warmth in the leg -pain on swallowing -swelling of the ankles, feet, hands -trouble passing urine or change in the amount of urine -vomiting -yellowing of the eyes or skin Side effects that usually do not require medical attention (report to your doctor or health care professional if they continue or are bothersome): -hair loss -loss of appetite -nausea -stomach upset This list may not describe all possible side effects. Call your doctor for medical advice about side effects. You may report side effects to FDA at 1-800-FDA-1088. Where should I keep  my medicine? This drug is given in a hospital or clinic and will not be stored at home. NOTE: This sheet is a summary. It may not cover all possible information. If you have questions about this medicine, talk to your doctor, pharmacist, or health care provider.  2012, [employer]. (01/08/2008 1:24:03 PM)

## 2012-07-30 NOTE — Telephone Encounter (Signed)
Gave pt appt for lab and [facility] regarding chemo

## 2012-07-30 NOTE — Patient Instructions (Addendum)
Follow up in 3 weeks prior to your next cycle of chemotherapy 

## 2012-07-31 ENCOUNTER — Telehealth: Payer: Self-pay | Admitting: *Deleted

## 2012-07-31 NOTE — Telephone Encounter (Signed)
Per staff message and POF I have scheduled appts.  JMW  

## 2012-07-31 NOTE — Progress Notes (Signed)
[facility] [phone]   [phone]  OFFICE PROGRESS NOTE  [name], MD [address] [place] [address]  DIAGNOSIS: Metastatic non-small cell lung cancer (T2 NX M1). Presented with right upper lobe lung mass as well as multiple pulmonary nodules and bone metastasis at L3 vertebral body. This was diagnosed in March of 2006.   PRIOR THERAPY:  1. Status post palliative radiotherapy to the L3 lesion under the care of Dr. Kathrynn Running completed September 17, 2004. 2. Status post 6 cycles of systemic chemotherapy with carboplatin and paclitaxel. Last dose was given January 01, 2005. 3. Status post palliative radiotherapy to the right upper lobe lung mass under the care of Dr. Kathrynn Running completed on 12/29/2010. 4. Status post treatment with Tarceva 150 mg p.o. daily started April 2007 through July 2012. 5. Tarceva 100 mg p.o. daily started July 2012. 6. Systemic chemotherapy with Carboplatin for AUC 5 and Alimta 500 mg/m2 given every 3 weeks. Status post 6 cycles.  CURRENT THERAPY:  1. Maintenance chemotherapy with single agent Alimta at 500 mg per meter squared given every 3 weeks, first cycle beginning today 2. Zometa 4 mg IV q. 3 months for bone metastasis  INTERVAL HISTORY: Jamie Munoz 79 y.o. female returns to the clinic today for followup visit accompanied by a friend. The patient is feeling fine today with no specific complaints. She reports that she was hospitalized for 2 days as well as finished a course of outpatient antibiotics for her left lower extremity cellulitis. She states that the symptoms are significantly better although she still has some mild swelling.  The patient tolerated her last cycle of the chemotherapy fairly well. She denied having any significant weight loss or night sweats. She denied having any chest pain, shortness breath, cough or hemoptysis. She presents to proceed with her first cycle of maintenance chemotherapy with  single agent Alimta.   MEDICAL HISTORY: Past Medical History  Diagnosis Date  . Cancer   . Lung cancer   . Hypertension   . Cellulitis of left lower extremity 07/16/2012    ALLERGIES:  is allergic to codeine.  MEDICATIONS:  Current Outpatient Prescriptions  Medication Sig Dispense Refill  . ALPRAZolam (XANAX) 0.25 MG tablet Take 0.25 mg by mouth at bedtime as needed.      . Alum & Mag Hydroxide-Simeth (MAGIC MOUTHWASH) SOLN Take 5 mLs by mouth 4 (four) times daily as needed.  120 mL  0  . bisacodyl (DULCOLAX) 5 MG EC tablet Take 5 mg by mouth daily as needed.      . cephALEXin (KEFLEX) 500 MG capsule       . ciprofloxacin (CIPRO) 500 MG tablet       . clindamycin (CLEOCIN T) 1 % lotion APPLY TOPICALLY 2 TIMES A DAY  60 mL  2  . clindamycin (CLEOCIN) 300 MG capsule Take 2 capsules (600 mg total) by mouth 3 (three) times daily.  42 capsule  0  . dexamethasone (DECADRON) 4 MG tablet Take 4 mg po bid the day before , the day of and the day after chemotherapy  30 tablet  0  . docusate sodium (COLACE) 100 MG capsule Take 100 mg by mouth 2 (two) times daily.        . furosemide (LASIX) 20 MG tablet Take 20 mg by mouth Daily.      Marland Kitchen gabapentin (NEURONTIN) 100 MG capsule       . lidocaine-prilocaine (EMLA) cream Apply  topically as needed.        Marland Kitchen olmesartan (BENICAR) 20 MG tablet Take 20 mg by mouth daily.        Marland Kitchen oxyCODONE (OXYCONTIN) 20 MG 12 hr tablet Take 1 tablet (20 mg total) by mouth at bedtime.  30 tablet  0  . PRESCRIPTION MEDICATION Last treatment 06-29-12. Followed by Dr Gwenyth Bouillon      . prochlorperazine (COMPAZINE) 10 MG tablet TAKE (1) TABLET EVERY SIX HOURS AS NEEDED.  60 tablet  0  . temazepam (RESTORIL) 15 MG capsule Take 15 mg by mouth at bedtime as needed.       No current facility-administered medications for this visit.   Facility-Administered Medications Ordered in Other Visits  Medication Dose Route Frequency Provider Last Rate Last Dose  . influenza  inactive virus  vaccine (FLUZONE/FLUARIX) injection 0.5 mL  0.5 mL Intramuscular Once Si Gaul, MD        REVIEW OF SYSTEMS:  A comprehensive review of systems was negative except for: Constitutional: positive for fatigue Cellulitis of the left lower extremity, now resolved   PHYSICAL EXAMINATION: General appearance: alert, cooperative and no distress Head: Normocephalic, without obvious abnormality, atraumatic Neck: no adenopathy Lymph nodes: Cervical, supraclavicular, and axillary nodes normal. Resp: clear to auscultation bilaterally Cardio: regular rate and rhythm, S1, S2 normal, no murmur, click, rub or gallop GI: soft, non-tender; bowel sounds normal; no masses,  no organomegaly Extremities: Significant resolution in the cellulitis of the left lower extremity, 1-2+ soft tissue edema of the left lower extremity without erythema warmth or pain. No evidence of infection. Neurologic: Alert and oriented X 3, normal strength and tone. Normal symmetric reflexes. Normal coordination and gait  ECOG PERFORMANCE STATUS: 1 - Symptomatic but completely ambulatory  Blood pressure 158/62, pulse 72, temperature 97.8 F (36.6 C), temperature source Oral, resp. rate 18, height 5\' 6"  (1.676 m), weight 140 lb 6.4 oz (63.685 kg).  LABORATORY DATA: Lab Results  Component Value Date   WBC 7.2 07/30/2012   HGB 9.8* 07/30/2012   HCT 29.5* 07/30/2012   MCV 102.1* 07/30/2012   PLT 180 07/30/2012      Chemistry      Component Value Date/Time   NA 141 07/30/2012 1359   NA 138 07/19/2012 0603   NA 139 09/02/2011 1406   K 4.6 07/30/2012 1359   K 3.4* 07/19/2012 0603   K 3.9 09/02/2011 1406   CL 108* 07/30/2012 1359   CL 103 07/19/2012 0603   CL 97* 09/02/2011 1406   CO2 26 07/30/2012 1359   CO2 24 07/19/2012 0603   CO2 29 09/02/2011 1406   BUN 19.4 07/30/2012 1359   BUN 20 07/19/2012 0603   BUN 18 09/02/2011 1406   CREATININE 1.1 07/30/2012 1359   CREATININE 1.11* 07/19/2012 0603   CREATININE 1.2 09/02/2011 1406       Component Value Date/Time   CALCIUM 8.7 07/30/2012 1359   CALCIUM 7.9* 07/19/2012 0603   CALCIUM 8.0 09/02/2011 1406   ALKPHOS 69 07/30/2012 1359   ALKPHOS 57 07/17/2012 1040   ALKPHOS 71 09/02/2011 1406   AST 23 07/30/2012 1359   AST 22 07/17/2012 1040   AST 25 09/02/2011 1406   ALT 10 07/30/2012 1359   ALT 10 07/17/2012 1040   BILITOT 0.37 07/30/2012 1359   BILITOT 0.6 07/17/2012 1040   BILITOT 0.60 09/02/2011 1406       RADIOGRAPHIC STUDIES: Ct Chest W Contrast  07/12/2012  *RADIOLOGY REPORT*  Clinical Data:  Restaging  lung cancer.  CT CHEST, ABDOMEN AND PELVIS WITH CONTRAST  Technique:  Multidetector CT imaging of the chest, abdomen and pelvis was performed following the standard protocol during bolus administration of intravenous contrast.  Contrast: OMNIPAQUE IOHEXOL 300 MG/ML  SOLN  Comparison:  CT 05/08/2012, PET CT 11/26/2010   CT CHEST  Findings:  There is a port in the right anterior chest wall.  No axillary or supraclavicular lymphadenopathy.  No mediastinal or hilar lymphadenopathy.  There is gas and fluid in  the esophagus. No pericardial fluid.  Review of the lung parenchyma demonstrates a rounded right upper lobe nodule measuring 26 x 25 mm (image 18, series 4) compared to 26 x 24 mm on prior for no significant change. This nodule was hypermetabolic on comparison PET CT scan.  There is linear thickening superior to this lesion which is also unchanged.  There is bibasilar nodular pleural thickening  IMPRESSION:  1.  Stable round right upper lobe 2.5 cm nodule.  2.  No evidence of disease progression.   CT ABDOMEN AND PELVIS  Findings:  There is no focal hepatic lesion.  There is persistent biliary ductal dilatation the common bile duct which is unchanged compared to prior.  The patient is post cholecystectomy. The pancreas, spleen, adrenal glands, and kidneys are normal.  The stomach, small bowel, and colon are normal.  Abdominal aorta normal caliber.  No retroperitoneal periportal  lymphadenopathy.  No free fluid the pelvis.   No pelvic lymphadenopathy.  There is diverticular disease as sigmoid colon.  The skeleton demonstrates a rounded sclerotic lesion in the L1 vertebral body unchanged compared to prior. Sclerotic changes at L3 with volume loss is also unchanged. These lesions were not hypermetabolic on comparison PET CT scan.  IMPRESSION:  1.  No evidence metastasis in the abdomen pelvis. 2.  Stable sclerotic lesion in the lumbar spine which were not hypermetabolic on comparison PET CT scan and likely represent treated metastasis.   Original Report Authenticated By: Genevive Bi, M.D.    ASSESSMENT: This is a very pleasant 79 years old white female with metastatic non-small cell lung cancer recently completed 6 cycles of systemic chemotherapy with carboplatin and Alimta with stable disease. Patient is status post a brief admission for the left lower extremity cellulitis, now having completed course of antibiotics with significant resolution although remains with some mild soft tissue swelling. Patient was discussed Dr. Arbutus Ped. She'll proceed with her first cycle of maintenance chemotherapy with single agent Alimta at 500 mg per meter squared given every 3 weeks. She'll return in 3 weeks prior to cycle #2 with a repeat CBC differential and C. met   Isayah Ignasiak E, PA-C   She was advised to call immediately if she has any concerning symptoms in the interval. All questions were answered. The patient knows to call the clinic with any problems, questions or concerns. We can certainly see the patient much sooner if necessary.  I spent 20 minutes counseling the patient face to face. The total time spent in the appointment was 30 minutes.

## 2012-07-31 NOTE — Telephone Encounter (Signed)
INSTRUCTED PT. TO FORCE FLUIDS TO 64 OUNCES IN A 24 HOUR PERIOD. SHE VOICES UNDERSTANDING. NO PROBLEMS OR QUESTIONS AT THIS TIME. PT. WILL CALL THIS OFFICE IF THE NEED ARISES. NOTIFIED PT. OF HER APPOINTMENTS.

## 2012-08-06 ENCOUNTER — Telehealth: Payer: Self-pay | Admitting: Internal Medicine

## 2012-08-06 NOTE — Telephone Encounter (Signed)
Talked to patient gave her appt for 08/20/12 lab,ML and infusion

## 2012-08-08 ENCOUNTER — Telehealth: Payer: Self-pay | Admitting: Medical Oncology

## 2012-08-08 NOTE — Telephone Encounter (Signed)
Pt called back and she will call her PCP or go to ED

## 2012-08-08 NOTE — Telephone Encounter (Signed)
Per Dr Arbutus Ped I left message for pt to see PCP or go to ED today.

## 2012-08-08 NOTE — Telephone Encounter (Signed)
Reports left leg is swollen ,  red and burning sensation all the way from foot towards knee . She is elevating it . She is using a crutch because it hurts to walk on it.

## 2012-08-10 ENCOUNTER — Encounter (HOSPITAL_COMMUNITY): Payer: Self-pay | Admitting: Emergency Medicine

## 2012-08-10 ENCOUNTER — Inpatient Hospital Stay (HOSPITAL_COMMUNITY)
Admission: EM | Admit: 2012-08-10 | Discharge: 2012-08-18 | DRG: 545 | Disposition: A | Discharge status: Home / Self Care | Payer: Medicare Other | Attending: Internal Medicine | Admitting: Internal Medicine

## 2012-08-10 DIAGNOSIS — Z79899 Other long term (current) drug therapy: Secondary | ICD-10-CM

## 2012-08-10 DIAGNOSIS — T451X5A Adverse effect of antineoplastic and immunosuppressive drugs, initial encounter: Secondary | ICD-10-CM | POA: Diagnosis present

## 2012-08-10 DIAGNOSIS — D696 Thrombocytopenia, unspecified: Secondary | ICD-10-CM | POA: Diagnosis present

## 2012-08-10 DIAGNOSIS — C3410 Malignant neoplasm of upper lobe, unspecified bronchus or lung: Secondary | ICD-10-CM | POA: Diagnosis present

## 2012-08-10 DIAGNOSIS — L02419 Cutaneous abscess of limb, unspecified: Secondary | ICD-10-CM

## 2012-08-10 DIAGNOSIS — L03119 Cellulitis of unspecified part of limb: Secondary | ICD-10-CM

## 2012-08-10 DIAGNOSIS — D649 Anemia, unspecified: Secondary | ICD-10-CM

## 2012-08-10 DIAGNOSIS — D61810 Antineoplastic chemotherapy induced pancytopenia: Secondary | ICD-10-CM | POA: Diagnosis present

## 2012-08-10 DIAGNOSIS — D61818 Other pancytopenia: Secondary | ICD-10-CM

## 2012-08-10 DIAGNOSIS — R404 Transient alteration of awareness: Secondary | ICD-10-CM | POA: Diagnosis present

## 2012-08-10 DIAGNOSIS — D539 Nutritional anemia, unspecified: Secondary | ICD-10-CM | POA: Diagnosis present

## 2012-08-10 DIAGNOSIS — D62 Acute posthemorrhagic anemia: Secondary | ICD-10-CM | POA: Diagnosis present

## 2012-08-10 DIAGNOSIS — L259 Unspecified contact dermatitis, unspecified cause: Secondary | ICD-10-CM | POA: Diagnosis present

## 2012-08-10 DIAGNOSIS — E871 Hypo-osmolality and hyponatremia: Secondary | ICD-10-CM | POA: Diagnosis present

## 2012-08-10 DIAGNOSIS — E876 Hypokalemia: Secondary | ICD-10-CM | POA: Diagnosis present

## 2012-08-10 DIAGNOSIS — Z7982 Long term (current) use of aspirin: Secondary | ICD-10-CM

## 2012-08-10 DIAGNOSIS — I10 Essential (primary) hypertension: Secondary | ICD-10-CM | POA: Diagnosis present

## 2012-08-10 DIAGNOSIS — D709 Neutropenia, unspecified: Secondary | ICD-10-CM | POA: Diagnosis present

## 2012-08-10 DIAGNOSIS — C7951 Secondary malignant neoplasm of bone: Secondary | ICD-10-CM | POA: Diagnosis present

## 2012-08-10 DIAGNOSIS — L03116 Cellulitis of left lower limb: Secondary | ICD-10-CM | POA: Diagnosis present

## 2012-08-10 DIAGNOSIS — M310 Hypersensitivity angiitis: Secondary | ICD-10-CM | POA: Diagnosis present

## 2012-08-10 DIAGNOSIS — C3490 Malignant neoplasm of unspecified part of unspecified bronchus or lung: Secondary | ICD-10-CM

## 2012-08-10 DIAGNOSIS — N179 Acute kidney failure, unspecified: Secondary | ICD-10-CM

## 2012-08-10 LAB — CBC WITH DIFFERENTIAL/PLATELET
Band Neutrophils: 0 % (ref 0–10)
Basophils Relative: 0 % (ref 0–1)
Eosinophils Relative: 0 % (ref 0–5)
HCT: 19.2 % — ABNORMAL LOW (ref 36.0–46.0)
Hemoglobin: 6.7 g/dL — CL (ref 12.0–15.0)
Lymphocytes Relative: 0 % — ABNORMAL LOW (ref 12–46)
Lymphs Abs: 0 10*3/uL — ABNORMAL LOW (ref 0.7–4.0)
MCH: 35.1 pg — ABNORMAL HIGH (ref 26.0–34.0)
MCHC: 34.9 g/dL (ref 30.0–36.0)
Monocytes Absolute: 0 10*3/uL — ABNORMAL LOW (ref 0.1–1.0)
RBC: 1.91 MIL/uL — ABNORMAL LOW (ref 3.87–5.11)

## 2012-08-10 LAB — COMPREHENSIVE METABOLIC PANEL
AST: 30 U/L (ref 0–37)
Albumin: 2.7 g/dL — ABNORMAL LOW (ref 3.5–5.2)
Alkaline Phosphatase: 78 U/L (ref 39–117)
CO2: 24 mEq/L (ref 19–32)
Chloride: 98 mEq/L (ref 96–112)
Creatinine, Ser: 1.29 mg/dL — ABNORMAL HIGH (ref 0.50–1.10)
GFR calc non Af Amer: 37 mL/min — ABNORMAL LOW (ref >90)
Potassium: 3.9 mEq/L (ref 3.5–5.1)
Total Bilirubin: 0.6 mg/dL (ref 0.3–1.2)

## 2012-08-10 LAB — LACTIC ACID, PLASMA: Lactic Acid, Venous: 0.8 mmol/L (ref 0.5–2.2)

## 2012-08-10 MED ORDER — ONDANSETRON HCL 4 MG PO TABS: 4.0000 mg | ORAL_TABLET | Freq: Four times a day (QID) | ORAL | Status: DC | PRN

## 2012-08-10 MED ORDER — ONDANSETRON HCL 4 MG/2ML IJ SOLN: 4.0000 mg | Freq: Four times a day (QID) | INTRAMUSCULAR | Status: DC | PRN

## 2012-08-10 MED ORDER — TEMAZEPAM 15 MG PO CAPS: 15.0000 mg | ORAL_CAPSULE | Freq: Every evening | ORAL | Status: DC | PRN

## 2012-08-10 MED ORDER — LIDOCAINE-PRILOCAINE 2.5-2.5 % EX CREA: 1.0000 "application " | TOPICAL_CREAM | CUTANEOUS | Status: DC | PRN

## 2012-08-10 MED ORDER — ALPRAZOLAM 0.25 MG PO TABS: 0.2500 mg | ORAL_TABLET | Freq: Three times a day (TID) | ORAL | Status: DC | PRN

## 2012-08-10 MED ORDER — SODIUM CHLORIDE 0.9 % IJ SOLN
3.0000 mL | Freq: Two times a day (BID) | INTRAMUSCULAR | Status: DC
  Administered 2012-08-16: 3 mL via INTRAVENOUS
  Administered 2012-08-16: 10 mL via INTRAVENOUS
  Administered 2012-08-17: 10:00:00 via INTRAVENOUS
  Administered 2012-08-17 – 2012-08-18 (×2): 3 mL via INTRAVENOUS

## 2012-08-10 MED ORDER — SODIUM CHLORIDE 0.9 % IV SOLN
INTRAVENOUS | Status: DC
  Administered 2012-08-10: 1000 mL via INTRAVENOUS
  Administered 2012-08-11 – 2012-08-13 (×3): via INTRAVENOUS

## 2012-08-10 MED ORDER — DOCUSATE SODIUM 100 MG PO CAPS
100.0000 mg | ORAL_CAPSULE | Freq: Two times a day (BID) | ORAL | Status: DC
  Administered 2012-08-10 – 2012-08-13 (×7): 100 mg via ORAL
  Filled 2012-08-10 (×9): qty 1

## 2012-08-10 MED ORDER — FILGRASTIM 480 MCG/1.6ML IJ SOLN
480.0000 ug | INTRAMUSCULAR | Status: AC
  Administered 2012-08-10: 480 ug via SUBCUTANEOUS
  Filled 2012-08-10: qty 1.6

## 2012-08-10 MED ORDER — FILGRASTIM 480 MCG/1.6ML IJ SOLN: 480.0000 ug | Freq: Once | INTRAVENOUS | Status: DC

## 2012-08-10 MED ORDER — FILGRASTIM 300 MCG/ML IJ SOLN
300.0000 ug | Freq: Every day | INTRAMUSCULAR | Status: DC
  Administered 2012-08-11 – 2012-08-12 (×2): 300 ug via SUBCUTANEOUS
  Filled 2012-08-10 (×3): qty 1

## 2012-08-10 MED ORDER — ACETAMINOPHEN 650 MG RE SUPP: 650.0000 mg | Freq: Four times a day (QID) | RECTAL | Status: DC | PRN

## 2012-08-10 MED ORDER — VANCOMYCIN HCL 1000 MG IV SOLR: 750.0000 mg | INTRAVENOUS | Status: DC

## 2012-08-10 MED ORDER — VANCOMYCIN HCL IN DEXTROSE 1-5 GM/200ML-% IV SOLN
1000.0000 mg | Freq: Once | INTRAVENOUS | Status: AC
  Administered 2012-08-11: 1000 mg via INTRAVENOUS
  Filled 2012-08-10: qty 200

## 2012-08-10 MED ORDER — ACETAMINOPHEN 325 MG PO TABS
650.0000 mg | ORAL_TABLET | Freq: Four times a day (QID) | ORAL | Status: DC | PRN
  Administered 2012-08-11: 650 mg via ORAL
  Filled 2012-08-10: qty 2

## 2012-08-10 MED ORDER — OXYCODONE HCL ER 20 MG PO T12A
20.0000 mg | EXTENDED_RELEASE_TABLET | Freq: Every day | ORAL | Status: DC
  Administered 2012-08-10 – 2012-08-17 (×8): 20 mg via ORAL
  Filled 2012-08-10 (×8): qty 1

## 2012-08-10 MED ORDER — MORPHINE SULFATE 4 MG/ML IJ SOLN: 4.0000 mg | INTRAMUSCULAR | Status: DC | PRN

## 2012-08-10 MED ORDER — DEXTROSE 5 % IV SOLN
1.0000 g | Freq: Three times a day (TID) | INTRAVENOUS | Status: DC
  Administered 2012-08-10 – 2012-08-14 (×12): 1 g via INTRAVENOUS
  Filled 2012-08-10 (×13): qty 1

## 2012-08-10 MED ORDER — GABAPENTIN 100 MG PO CAPS
200.0000 mg | ORAL_CAPSULE | Freq: Three times a day (TID) | ORAL | Status: DC
  Administered 2012-08-10 – 2012-08-18 (×24): 200 mg via ORAL
  Filled 2012-08-10 (×27): qty 2

## 2012-08-10 NOTE — ED Notes (Signed)
Hospitalist in Rm

## 2012-08-10 NOTE — Progress Notes (Addendum)
ANTIBIOTIC CONSULT NOTE - INITIAL  Pharmacy Consult for Vancomycin Indication: Cellulitis   Allergies  Allergen Reactions  . Codeine Shortness Of Breath    Patient Measurements: Height: 5' 6.14" (168 cm) Weight: 140 lb 6.9 oz (63.7 kg) IBW/kg (Calculated) : 59.63  Vital Signs: Temp: 98.6 F (37 C) (02/21 1610) Temp src: Oral (02/21 1610) BP: 131/49 mmHg (02/21 1625) Pulse Rate: 89 (02/21 1625) Intake/Output from previous day:   Intake/Output from this shift: Total I/O In: 355 [Blood:355] Out: -   Labs:  Recent Labs  08/10/12 1322  WBC 0.7*  HGB 6.7*  PLT 45*  CREATININE 1.29*   Estimated Creatinine Clearance: 31.1 ml/min (by C-G formula based on Cr of 1.29). No results found for this basename: VANCOTROUGH, VANCOPEAK, VANCORANDOM, GENTTROUGH, GENTPEAK, GENTRANDOM, TOBRATROUGH, TOBRAPEAK, TOBRARND, AMIKACINPEAK, AMIKACINTROU, AMIKACIN,  in the last 72 hours   Microbiology: No results found for this or any previous visit (from the past 720 hour(s)).  Medical History: Past Medical History  Diagnosis Date  . Cancer   . Lung cancer   . Hypertension   . Cellulitis of left lower extremity 07/16/2012    Assessment: 12 yof with h/o metastatic NSCLC (currently receiving chemotherapy) presented 2/21 with increasing bilateral LE pain/erythema.  Cellulitis initiated in early January 2014, patient was hospitalized between 07/17/12 - 07/19/12 where she was placed on IV clindamycin and transitioned to oral clindamycin on discharge.  Patient went to PCP 08/08/2012 and was prescribed PO Clindamycin with no improvement. Given recent chemotherapy, patient found pancytopenic - MD would like to start empiric Vancomycin.    Scr 1.29, CG CrCl 31 ml/min, normalized CrCl 37 ml/min. Patient is afebrile, WBC 0.7 (ANC = 0).    Goal of Therapy:  Vancomycin trough level 10-15 mcg/ml  Plan:   Vancomycin 1gm IV x 1 then 750 mg IV q24h  Consider adding gram negative coverage if pt does not  improve quickly  Pharmacy will f/u  Geoffry Paradise, PharmD, BCPS Pager: [phone] 5:08 PM Pharmacy #: [id number]     Addendum:   MD added Azactam per pharmacy for gram negative coverage.    Plan:  Azactam 1gm IV q8h.  Pharmacy will f/u  Geoffry Paradise, PharmD, BCPS Pager: [phone] 5:56 PM Pharmacy #: [id number]

## 2012-08-10 NOTE — Progress Notes (Signed)
#  [id number] is consult note.  Jamie Guy Munoz 1:5-7

## 2012-08-10 NOTE — ED Provider Notes (Signed)
History     CSN: [id number]  Arrival date & time 08/10/12  1220   First MD Initiated Contact with Patient 08/10/12 1256      Chief Complaint  Patient presents with  . Cellulitis    (Consider location/radiation/quality/duration/timing/severity/associated sxs/prior treatment) HPI The patient presents with concern of increasing bilateral lower extremity pain and erythema.  Symptoms began approximately 3 weeks ago, initially with pain and erythema about the left distal lower extremity.  About that time she started on oral antibiotic.  After several days with increasing symptoms, she was seen here, admitted and treated with IV antibiotics for cellulitis.  She states that since discharge, including transition to oral antibiotics, she has had increasing erythema, pain throughout the left calf and now on the right anterior calf.  There is no new chest pain, dyspnea, fever, chills, nausea, vomiting, abdominal pain, or any other new focal complaints. There are no clear alleviating factors. The patient has a history of metastatic lung cancer for which she is currently getting chemotherapy.  Last dose was 2 weeks ago. Past Medical History  Diagnosis Date  . Cancer   . Lung cancer   . Hypertension   . Cellulitis of left lower extremity 07/16/2012    Past Surgical History  Procedure Laterality Date  . Cholecystectomy    . Abdominal hysterectomy    . Breast surgery    . Vaginal sling      Family History  Problem Relation Age of Onset  . Cancer Mother   . Heart attack Father   . Cancer Brother   . Cancer Brother   . Cancer Brother     History  Substance Use Topics  . Smoking status: Never Smoker   . Smokeless tobacco: Never Used  . Alcohol Use: No    OB History   Grav Para Term Preterm Abortions TAB SAB Ect Mult Living                  Review of Systems  Constitutional:       Per HPI, otherwise negative  HENT:       Per HPI, otherwise negative  Respiratory:       Per  HPI, otherwise negative  Cardiovascular:       Per HPI, otherwise negative  Gastrointestinal: Negative for vomiting.  Endocrine:       Negative aside from HPI  Genitourinary:       Neg aside from HPI   Musculoskeletal:       Per HPI, otherwise negative  Skin: Negative.   Neurological: Negative for syncope.  Hematological:       History of present illness    Allergies  Codeine  Home Medications   Current Outpatient Rx  Name  Route  Sig  Dispense  Refill  . ALPRAZolam (XANAX) 0.25 MG tablet   Oral   Take 0.25 mg by mouth 3 (three) times daily as needed for anxiety.          Marland Kitchen aspirin EC 81 MG tablet   Oral   Take 81 mg by mouth daily.         . bisacodyl (DULCOLAX) 5 MG EC tablet   Oral   Take 5 mg by mouth daily as needed for constipation.          . clindamycin (CLEOCIN T) 1 % lotion   Topical   Apply 1 application topically 2 (two) times daily. Applied to face.         Marland Kitchen  clindamycin (CLEOCIN) 300 MG capsule   Oral   Take 600 mg by mouth 3 (three) times daily. Started 08/08/12 for 7 day course of therapy.         Marland Kitchen dexamethasone (DECADRON) 4 MG tablet   Oral   Take 4 mg by mouth 2 (two) times daily with a meal. Taken day before, day of and day after chemo.         . docusate sodium (COLACE) 100 MG capsule   Oral   Take 100 mg by mouth 2 (two) times daily as needed for constipation.          . gabapentin (NEURONTIN) 100 MG capsule   Oral   Take 200 mg by mouth 3 (three) times daily.         Marland Kitchen lidocaine-prilocaine (EMLA) cream   Topical   Apply 1 application topically as needed (for port-a-cath access.).          Marland Kitchen olmesartan (BENICAR) 20 MG tablet   Oral   Take 20 mg by mouth daily.           Marland Kitchen oxyCODONE (OXYCONTIN) 20 MG 12 hr tablet   Oral   Take 1 tablet (20 mg total) by mouth at bedtime.   30 tablet   0   . PRESCRIPTION MEDICATION   Intravenous   Inject into the vein every 21 ( twenty-one) days. Alimta infusion q21d.          . prochlorperazine (COMPAZINE) 10 MG tablet   Oral   Take 10 mg by mouth every 6 (six) hours as needed (for nausea.).         Marland Kitchen temazepam (RESTORIL) 15 MG capsule   Oral   Take 15 mg by mouth at bedtime as needed for sleep.            BP 127/39  Pulse 83  Temp(Src) 98 F (36.7 C) (Oral)  SpO2 100%  Physical Exam  Nursing note and vitals reviewed. Constitutional: She is oriented to person, place, and time. She appears well-developed and well-nourished. No distress.  HENT:  Head: Normocephalic and atraumatic.  Eyes: Conjunctivae and EOM are normal.  Cardiovascular: Normal rate and regular rhythm.   Pulmonary/Chest: Effort normal and breath sounds normal. No stridor. No respiratory distress.  Abdominal: She exhibits no distension.  Musculoskeletal: She exhibits edema.  The patient can move both ankles, knees appropriately  Neurological: She is alert and oriented to person, place, and time. No cranial nerve deficit.  Skin: Skin is warm and dry.  The left calf is circumferentially erythematous, indurated, with cobblestoning, no seepage.  Distally, the foot is closer to normal appearing, with appreciable pulses. The right anterior calf has purpuric changes, no similar induration, no discharge, no bleeding.  Psychiatric: She has a normal mood and affect.    ED Course  Procedures (including critical care time)  Labs Reviewed  CBC WITH DIFFERENTIAL  COMPREHENSIVE METABOLIC PANEL  LACTIC ACID, PLASMA   No results found.   No diagnosis found.  Pulse ox 99% room air normal  On repeat exam the patient appears calm.  Following return of initial labs I discussed the patient's case with our oncology team.  We reviewed the patient's records, agree that treatment with Neupogen, transfusion was appropriate given her symptomatic anemia, the new purpuric lesions.   MDM  This 79 year old female now presents with bilateral lower extremity lesions.  On exam the patient is  mentating well has stable vital signs, but is notable  erythema, induration about the left lower extremity, and new purpuric changes about the right lower extremity.  Although the patient has recently been evaluated, and admitted for cellulitis, she has completed 2 rounds of outpatient therapy, one inpatient admission, and currently though she has erythema, she is afebrile, with no other overt stigmata of infectious process.  The patient is distally neurovascularly intact.  With her purpuric changes, with concern for thrombocytopenia labs were conducted.  These were notable for demonstration of thrombocytopenia, leukopenia.  Chart review, and consultation with oncology dentures the patient did not recently receive Neupogen.  We agreed to provide this medication, transfuse the patient and she was admitted for further evaluation and management given the hematologic abnormalities.  CRITICAL CARE Performed by: Gerhard Munch   Total critical care time: 35  Critical care time was exclusive of separately billable procedures and treating other patients.  Critical care was necessary to treat or prevent imminent or life-threatening deterioration.  Critical care was time spent personally by me on the following activities: development of treatment plan with patient and/or surrogate as well as nursing, discussions with consultants, evaluation of patient's response to treatment, examination of patient, obtaining history from patient or surrogate, ordering and performing treatments and interventions, ordering and review of laboratory studies, ordering and review of radiographic studies, pulse oximetry and re-evaluation of patient's condition.         Gerhard Munch, MD 08/10/12 [phone]

## 2012-08-10 NOTE — ED Notes (Signed)
Pt presents to the Ed with c/o L leg cellulitis.  Pt reports being seen in the hospital 2weeks ago and was started on an antibiotic.  Pt was seen by PCP 2 days ago due to increased pain in leg and was started on new antibiotic, clindamycin.  Pt also itchiness all over.  Pt has bilateral leg redness, swelling, and pain.

## 2012-08-10 NOTE — H&P (Signed)
[facility] History and Physical  Jamie Munoz:[id number] DOB: Oct 20, 1926 DOA: 08/10/2012   PCP: [name], MD   Chief Complaint: Left greater than right leg pain and erythema  HPI:  79 year old female with a history of metastatic non-small cell carcinoma of the lung, hypertension, anxiety presents with 3 day history of worsening left lower extremity edema, erythema, and pain. The patient has been struggling with this since early January. Initially, the patient was started on Keflex on 07/04/2012. There was no significant improvement. The patient was then given an IM dose of ceftriaxone and started on Cipro. Again, there is no significant improvement. The patient was admitted to the hospital between 07/17/2012 until January 30. She was placed on clindamycin IV and discharged on oral clindamycin. Apparently there was some mild improvement in her erythema and edema. Unfortunately, things worsened again approximately 3 days ago with the above symptoms. She went to see her primary care provider 2 days ago prior to admission. The patient was started on clindamycin again. There is no improvement. As a result she presented to the emergency department today.  Regarding the patient's cancer, the patient has been followed by Dr. Arbutus Ped.  She is s/p 6 cycles of carboplatin and Alimta.  Her last chemo was single agent Alimta on 07/31/12. The patient's workup in the emergency department revealed that she had pancytopenia with a white blood cell count 0.7, hemoglobin 6.7, platelets 45,000. In addition, the patient was noted to have acute renal insufficiency with a serum creatinine of 1.29. Her baseline creatinine is between 0.8-1.0. Assessment/Plan: Erythema/edema/ pain of the left greater than right lower extremity  -Highly suspect NON-infectious component including but not limited to reaction to chemo/other meds, leukocytoclastic vasculitis vs paraneoplastic syndrome, or venous stasis -Cannot  100% rule out infection at this point as patient is leukopenic and is unable to mount a significant inflammatory response -In addition consideration must be given to atypical infection such as Nocardia or mycobacteria  -Examination reveals that the erythema is NON-blanching -Blood cultures x2 sets -Start empiric vancomycin -MRI left lower extremity Pancytopenia -Likely related to the patient's recent chemotherapy -Last dose of chemotherapy 07/31/2012 -Neupogen 5 mcg per kilogram daily -Transfuse 2 units rbc's--ordered in the ED Diffuse truncal rash -Erythematous blanchable -Suspect medication reaction -No oral lesions or mucositis to suggest Stevens-Johnson's Hypertension -Hold Benicar for now--?leg reaction to benicar -May need to substitute other antihypertensives Anxiety -Continue home dose of Xanax 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 Myna Hidalgo as pt's usual oncologist not available today Acute kidney injury -Likely volume depletion -Start IV fluids -Hold ARB       Past Medical History  Diagnosis Date  . Cancer   . Lung cancer   . Hypertension   . Cellulitis of left lower extremity 07/16/2012   Past Surgical History  Procedure Laterality Date  . Cholecystectomy    . Abdominal hysterectomy    . Breast surgery    . Vaginal sling     Social History:  reports that she has never smoked. She has never used smokeless tobacco. She reports that she does not drink alcohol or use illicit drugs.   Family History  Problem Relation Age of Onset  . Cancer Mother   . Heart attack Father   . Cancer Brother   . Cancer Brother   . Cancer Brother  Allergies  Allergen Reactions  . Codeine Shortness Of Breath      Prior to Admission medications   Medication Sig Start Date End Date Taking? Authorizing Provider   ALPRAZolam (XANAX) 0.25 MG tablet Take 0.25 mg by mouth 3 (three) times daily as needed for anxiety.    Yes Historical Provider, MD  aspirin EC 81 MG tablet Take 81 mg by mouth daily.   Yes Historical Provider, MD  bisacodyl (DULCOLAX) 5 MG EC tablet Take 5 mg by mouth daily as needed for constipation.    Yes Historical Provider, MD  clindamycin (CLEOCIN T) 1 % lotion Apply 1 application topically 2 (two) times daily. Applied to face.   Yes Historical Provider, MD  clindamycin (CLEOCIN) 300 MG capsule Take 600 mg by mouth 3 (three) times daily. Started 08/08/12 for 7 day course of therapy.   Yes Historical Provider, MD  dexamethasone (DECADRON) 4 MG tablet Take 4 mg by mouth 2 (two) times daily with a meal. Taken day before, day of and day after chemo.   Yes Historical Provider, MD  docusate sodium (COLACE) 100 MG capsule Take 100 mg by mouth 2 (two) times daily as needed for constipation.    Yes Historical Provider, MD  gabapentin (NEURONTIN) 100 MG capsule Take 200 mg by mouth 3 (three) times daily.   Yes Historical Provider, MD  lidocaine-prilocaine (EMLA) cream Apply 1 application topically as needed (for port-a-cath access.).    Yes Historical Provider, MD  olmesartan (BENICAR) 20 MG tablet Take 20 mg by mouth daily.     Yes Historical Provider, MD  oxyCODONE (OXYCONTIN) 20 MG 12 hr tablet Take 1 tablet (20 mg total) by mouth at bedtime. 07/30/12  Yes Conni Slipper, PA  PRESCRIPTION MEDICATION Inject into the vein every 21 ( twenty-one) days. Alimta infusion q21d.   Yes Historical Provider, MD  prochlorperazine (COMPAZINE) 10 MG tablet Take 10 mg by mouth every 6 (six) hours as needed (for nausea.).   Yes Historical Provider, MD  temazepam (RESTORIL) 15 MG capsule Take 15 mg by mouth at bedtime as needed for sleep.  03/26/12  Yes Conni Slipper, PA    Review of Systems:  Constitutional:  No weight loss, night sweats, Fevers, chills, fatigue.  Head&Eyes: No headache.  No vision loss.  No  eye pain or scotoma ENT:  No Difficulty swallowing,Tooth/dental problems,Sore throat,  No ear ache, post nasal drip,  Cardio-vascular:  No chest pain, Orthopnea, PND, swelling in lower extremities,  dizziness, palpitations  GI:  No  abdominal pain, nausea, vomiting, diarrhea, loss of appetite, hematochezia, melena, heartburn, indigestion, Resp:  No shortness of breath with exertion or at rest. No cough. No coughing up of blood .No wheezing.No chest wall deformity  Skin:  no rash or lesions.  GU:  no dysuria, change in color of urine, no urgency or frequency. No flank pain.  Musculoskeletal:  Complains of left greater than right leg pain with edema and erythema Psych:  No change in mood or affect.  Neurologic: No headache, no dysesthesia, no focal weakness, no vision loss. No syncope  Physical Exam: Filed Vitals:   08/10/12 1239  BP: 127/39  Pulse: 83  Temp: 98 F (36.7 C)  TempSrc: Oral  SpO2: 100%   General:  A&O x 3, NAD, nontoxic, pleasant/cooperative Head/Eye: No conjunctival hemorrhage, no icterus, [place]/AT, No nystagmus ENT:  No icterus,  No thrush, good dentition, no pharyngeal exudate, no mucositis, no oral ulcer Neck:  No masses, no lymphadenpathy,  no bruits CV:  RRR, no rub, no gallop, no S3 Lung:  CTAB, good air movement, no wheeze, no rhonchi Abdomen: soft/NT, +BS, nondistended, no peritoneal signs Ext: Left lower extremity below the knee with violaceous erythematous nonblanching rash extending to the patient's ankle with mild edema. No open wounds. No crepitance. Compartment is soft. Right lower extremity with knowledgeable pretibial violaceous rash with very small 2 mm black eschar type or lesion--no drainage, no crepitance, no necrosis  Labs on Admission:  Basic Metabolic Panel:  Recent Labs Lab 08/10/12 1322  NA 134*  K 3.9  CL 98  CO2 24  GLUCOSE 103*  BUN 28*  CREATININE 1.29*  CALCIUM 7.9*   Liver Function Tests:  Recent Labs Lab  08/10/12 1322  AST 30  ALT 15  ALKPHOS 78  BILITOT 0.6  PROT 6.3  ALBUMIN 2.7*   No results found for this basename: LIPASE, AMYLASE,  in the last 168 hours No results found for this basename: AMMONIA,  in the last 168 hours CBC:  Recent Labs Lab 08/10/12 1322  WBC 0.7*  HGB 6.7*  HCT 19.2*  MCV 100.5*  PLT 45*   Cardiac Enzymes: No results found for this basename: CKTOTAL, CKMB, CKMBINDEX, TROPONINI,  in the last 168 hours BNP: No components found with this basename: POCBNP,  CBG: No results found for this basename: GLUCAP,  in the last 168 hours  Radiological Exams on Admission: No results found.  EKG: Independently reviewed. none    Time spent:70 minutes Code Status:   FULL Family Communication:   Family at bedside   Lenah Messenger, DO  [facility] Pager [phone]  If 7PM-7AM, please contact night-coverage [email or web address] Password [facility] 08/10/2012, 3:33 PM

## 2012-08-11 ENCOUNTER — Inpatient Hospital Stay (HOSPITAL_COMMUNITY): Payer: Medicare Other

## 2012-08-11 LAB — TYPE AND SCREEN
ABO/RH(D): AB POS
Antibody Screen: NEGATIVE
Unit division: 0

## 2012-08-11 LAB — BASIC METABOLIC PANEL
Calcium: 7.2 mg/dL — ABNORMAL LOW (ref 8.4–10.5)
GFR calc Af Amer: 52 mL/min — ABNORMAL LOW (ref >90)
GFR calc non Af Amer: 45 mL/min — ABNORMAL LOW (ref >90)
Sodium: 131 mEq/L — ABNORMAL LOW (ref 135–145)

## 2012-08-11 LAB — CBC WITH DIFFERENTIAL/PLATELET
Basophils Absolute: 0 10*3/uL (ref 0.0–0.1)
Eosinophils Relative: 4 % (ref 0–5)
Monocytes Absolute: 0.1 10*3/uL (ref 0.1–1.0)
Monocytes Relative: 4 % (ref 3–12)
Neutrophils Relative %: 70 % (ref 43–77)
Platelets: 53 10*3/uL — ABNORMAL LOW (ref 150–400)
RBC: 2.29 MIL/uL — ABNORMAL LOW (ref 3.87–5.11)
WBC: 1.3 10*3/uL — CL (ref 4.0–10.5)

## 2012-08-11 MED ORDER — DIPHENHYDRAMINE-ZINC ACETATE 2-0.1 % EX CREA
TOPICAL_CREAM | Freq: Two times a day (BID) | CUTANEOUS | Status: DC | PRN
  Filled 2012-08-11: qty 28

## 2012-08-11 MED ORDER — VANCOMYCIN HCL 10 G IV SOLR
1250.0000 mg | INTRAVENOUS | Status: DC
  Administered 2012-08-11 – 2012-08-13 (×3): 1250 mg via INTRAVENOUS
  Filled 2012-08-11 (×4): qty 1250

## 2012-08-11 MED ORDER — CYPROHEPTADINE HCL 4 MG PO TABS
4.0000 mg | ORAL_TABLET | Freq: Once | ORAL | Status: DC
  Filled 2012-08-11: qty 1

## 2012-08-11 MED ORDER — DIPHENHYDRAMINE HCL 25 MG PO CAPS
25.0000 mg | ORAL_CAPSULE | Freq: Three times a day (TID) | ORAL | Status: DC | PRN
  Administered 2012-08-11: 25 mg via ORAL
  Filled 2012-08-11: qty 1

## 2012-08-11 MED ORDER — DIPHENHYDRAMINE HCL 25 MG PO CAPS: 25.0000 mg | ORAL_CAPSULE | Freq: Four times a day (QID) | ORAL | Status: DC | PRN

## 2012-08-11 NOTE — Progress Notes (Signed)
[name] is feeling better. Her left leg still is somewhat swollen and quite erythematous. She is on vancomycin and aztreonam. Cultures are pending. Her MRI of the left leg is pending.  There's no fever. Her appetite is down a little bit. She's had no nausea.  She is on Neupogen. Her white cell count is up to 1.3 today.  She's not noted any cough. There's no diarrhea. There is no bleeding.  Her vital signs are stable. She is afebrile. Blood pressure 98/43. Oral cavity shows no mucositis. There is no adenopathy in her neck. Lungs are clear bilaterally. Cardiac exam regular and rhythm with a normal S1-S2. There are no murmurs rubs or bruits. Abdominal exam soft. She has good bowel sounds.  She still has some swelling and erythema and warmth with her left lower leg.  We will see what the MRI shows. I'm glad that her white cell count is going up. I will continue the Neupogen until her neutrophil skin above 1000. I believe that double coverage with vancomycin and aztreonam is appropriate for right now.  We had an excellent prayer session today. She is looking forward to seeing Dr. Arbutus Ped tomorrow.  Jamie E.  Joshua 1:9

## 2012-08-11 NOTE — Progress Notes (Signed)
Blood infusing now and will be until after 9 pm. MRI ordered but MRI tech leave at 2200 and want "to check with MD if OK to do in am". Paged NP on call Jamie Munoz and she said it would be "OK' to wait to am to do scan.

## 2012-08-11 NOTE — Progress Notes (Addendum)
[facility] PROGRESS NOTE  Jamie Munoz:[id number] DOB: Nov 14, 1926 DOA: 08/10/2012 PCP: [name], MD  Assessment/Plan: 1. Left Lower extremity Cellulitis /Mild right  LE cellulitis: Continue with IV antibiotics, vancomycin and Aztreonam day 2. Patient improving clinically. Wound care consult. MRI negative for abscess, fascitis.  2. Pancytopenia, acute blood loss anemia secondary to chemotherapy. WBC increase to 1.3. Neutropenic. Received 2 units PRBC. Continue to monitor CBC. No evidence of bleeding. Continue with Neupogen until neutrophil above 1000. Appreciate Dr Myna Hidalgo help.  3. Hyponatremia: Continue with IV fluids.  4. Acute renal insufficiency: improved with IV fluids.  5. Pruritus: Benadryl PRN. Benadryl lotion.   Code Status: Full.  Family Communication: Care discussed with patient.  Disposition Plan: Remain in patient, home at time of discharge.    Consultants:  Dr Myna Hidalgo.   Procedures:  None.  Antibiotics:  Vancomycin 2-21  Aztreonam 2-21.   HPI/Subjective: Feeling better today. Feels erythema has improve some.  Has rash and generalize itching for a while.    Objective: Filed Vitals:   08/10/12 2138 08/11/12 0225 08/11/12 0245 08/11/12 0338  BP: 115/69 99/45 110/43 98/43  Pulse: 88 76 71 71  Temp: 99.6 F (37.6 C) 98.6 F (37 C) 98.1 F (36.7 C) 98.2 F (36.8 C)  TempSrc: Oral Oral Oral Oral  Resp: 16 18 18 18   Height:      Weight:      SpO2: 97%  94% 99%    Intake/Output Summary (Last 24 hours) at 08/11/12 0834 Last data filed at 08/11/12 0600  Gross per 24 hour  Intake 3146.25 ml  Output    450 ml  Net 2696.25 ml   Filed Weights   08/10/12 1654 08/10/12 1726  Weight: 63.7 kg (140 lb 6.9 oz) 62.9 kg (138 lb 10.7 oz)    Exam:   General:  No distress.   Cardiovascular: S 1, S 2 RRR  Respiratory: CTA  Abdomen: BS present, soft, NT  Extremities: Left  LE with significant erythema, swelling. Right with some  erythema.   Data Reviewed: Basic Metabolic Panel:  Recent Labs Lab 08/10/12 1322 08/11/12 0600  NA 134* 131*  K 3.9 3.6  CL 98 101  CO2 24 22  GLUCOSE 103* 74  BUN 28* 23  CREATININE 1.29* 1.10  CALCIUM 7.9* 7.2*   Liver Function Tests:  Recent Labs Lab 08/10/12 1322  AST 30  ALT 15  ALKPHOS 78  BILITOT 0.6  PROT 6.3  ALBUMIN 2.7*   CBC:  Recent Labs Lab 08/10/12 1322 08/11/12 0600  WBC 0.7* 1.3*  NEUTROABS  --  0.8*  HGB 6.7* 7.8*  HCT 19.2* 22.0*  MCV 100.5* 96.1  PLT 45* 53*   Cardiac Enzymes: No results found for this basename: CKTOTAL, CKMB, CKMBINDEX, TROPONINI,  in the last 168 hours BNP (last 3 results) No results found for this basename: PROBNP,  in the last 8760 hours CBG: No results found for this basename: GLUCAP,  in the last 168 hours  No results found for this or any previous visit (from the past 240 hour(s)).   Studies: No results found.  Scheduled Meds: . aztreonam  1 g Intravenous Q8H  . cyproheptadine  4 mg Oral Once  . docusate sodium  100 mg Oral BID  . filgrastim (NEUPOGEN)  SQ  300 mcg Subcutaneous Daily  . gabapentin  200 mg Oral TID  . OxyCODONE  20 mg Oral QHS  . sodium chloride  3 mL Intravenous Q12H  .  vancomycin  750 mg Intravenous Q24H   Continuous Infusions: . sodium chloride 1,000 mL (08/10/12 2131)    Active Problems:   Cellulitis of left lower extremity   Acute kidney injury   Non-small cell carcinoma of lung   Pancytopenia    Time spent: 25 minutes.    Tere Mcconaughey  [facility] Pager [phone]. If 8PM-8AM, please contact night-coverage at [email or web address], password [facility] 08/11/2012, 8:34 AM  LOS: 1 day

## 2012-08-11 NOTE — Progress Notes (Signed)
ANTIBIOTIC CONSULT NOTE  Pharmacy Consult for Vancomycin Indication: Cellulitis   Allergies  Allergen Reactions  . Codeine Shortness Of Breath    Patient Measurements: Height: 5\' 6"  (167.6 cm) Weight: 138 lb 10.7 oz (62.9 kg) IBW/kg (Calculated) : 59.3  Vital Signs: Temp: 98.2 F (36.8 C) (02/22 0338) Temp src: Oral (02/22 0338) BP: 98/43 mmHg (02/22 0338) Pulse Rate: 71 (02/22 0338) Intake/Output from previous day: 02/21 0701 - 02/22 0700 In: 3146.3 [P.O.:720; I.V.:761.3; Blood:1415; IV Piggyback:250] Out: 450 [Urine:450] Intake/Output from this shift:    Labs:  Recent Labs  08/10/12 1322 08/11/12 0600  WBC 0.7* 1.3*  HGB 6.7* 7.8*  PLT 45* 53*  CREATININE 1.29* 1.10   Estimated Creatinine Clearance: 36.3 ml/min (by C-G formula based on Cr of 1.1). No results found for this basename: VANCOTROUGH, VANCOPEAK, VANCORANDOM, GENTTROUGH, GENTPEAK, GENTRANDOM, TOBRATROUGH, TOBRAPEAK, TOBRARND, AMIKACINPEAK, AMIKACINTROU, AMIKACIN,  in the last 72 hours   Microbiology: No results found for this or any previous visit (from the past 720 hour(s)).  Medical History: Past Medical History  Diagnosis Date  . Cancer   . Lung cancer   . Hypertension   . Cellulitis of left lower extremity 07/16/2012    Assessment: 63 yof with h/o metastatic NSCLC (currently receiving chemotherapy) presented 2/21 with increasing bilateral LE pain/erythema.  Cellulitis initiated in early January 2014, patient was hospitalized between 07/17/12 - 07/19/12 where she was placed on IV clindamycin and transitioned to oral clindamycin on discharge.  Patient went to PCP 08/08/2012 and was prescribed PO Clindamycin with no improvement. Given recent chemotherapy, patient found pancytopenic.  Day #2 empiric vancomycin and azactam.  SCr improved since admission to SCr 1.1 (appears to be near baseline).  CG CrCl 38 ml/min, normalized CrCl 44 ml/min.   Patient is afebrile, WBC improving now as well (WBC 1.3,  ANC 0.8).  Blood cultures sent.  Will f/u results.  Goal of Therapy:  Vancomycin trough level 10-15 mcg/ml  Plan:   Adjust vancomycin to 1250 mg IV q24h due to improved SCr.   Continue Azactam 1g IV q8h.  Pharmacy will f/u  Clance Boll, PharmD, BCPS Pager: [phone] 08/11/2012 10:40 AM

## 2012-08-11 NOTE — Plan of Care (Signed)
Problem: Phase I Progression Outcomes Goal: OOB as tolerated unless otherwise ordered Outcome: Completed/Met Date Met:  08/11/12 Up with assist to [facility]

## 2012-08-11 NOTE — Consult Note (Cosign Needed)
NAMECAMARIE, Jamie Munoz NO.:  [id number]  MEDICAL RECORD NO.:  [id number]  LOCATION:  [id number]                         FACILITY:  [facility]  PHYSICIAN:  Josph Macho, M.D.  DATE OF BIRTH:  [birth year]  DATE OF CONSULTATION: DATE OF DISCHARGE:                                CONSULTATION   REASON FOR CONSULTATION: 1. Pancytopenic. 2. Metastatic non-small-cell lung cancer. 3. Cellulitis of the left lower leg.  HISTORY OF PRESENT ILLNESS:  Jamie Munoz is a very charming 79 year old, white female.  She is followed by Dr. Arbutus Ped.  She has a history of metastatic lung cancer, initially was diagnosed back in March 2006.  She has done incredibly well.  She currently is receiving maintenance chemotherapy with Alimta.  She, I think, has last got Alimta back a couple of weeks ago.  She also is getting Zometa.  She has been hospitalized recently.  She was discharged on July 19, 2012 with cellulitis, left lower leg.  She was treated with IV antibiotics.  She had a DVT that was ruled out by Dopplers.  She was treated with IV clindamycin.  She now is back.  The leg is quite red and swollen.  She was seen in the emergency room.  She was pancytopenic.  She was neutropenic with a white cell count of I think 0.5.  Hemoglobin was 6.7, platelet count 45,000.  There was no bleeding.  BUN and creatinine were 28 and 1.29.  Her albumin was 2.7 with a calcium of 7.9.  Total bilirubin was okay.  She had a normal lactic acid.  She did get a dose of Neupogen 480 mcg in the ER.  Dr. Arbutus Leas, being incredibly knowledgeable and proactive went ahead and got her on IV antibiotics with vancomycin.  He did order an MRI of the left lower extremity.  Blood cultures are pending.  Again, we were asked to see her to help with management of her blood counts and the cellulitis.  PAST MEDICAL HISTORY: 1. Remarkable for hypertension. 2. Cholecystectomy. 3. Abdominal hysterectomy.  ALLERGIES:   Codeine.  HOME MEDICATIONS:  Xanax 0.25 mg p.o. t.i.d. p.r.n., aspirin 81 mg p.o. daily, gabapentin 200 mg p.o. t.i.d., Benicar 20 mg p.o. daily, OxyContin 20 mg p.o. at bedtime, Restoril 15 mg p.o. at bedtime p.r.n., Compazine 10 mg p.o. q.6 h. p.r.n.  SOCIAL HISTORY:  Negative for tobacco or alcohol use.  FAMILY HISTORY:  Remarkable for multiple family members with cancer.  PHYSICAL EXAMINATION:  GENERAL:  This is an elderly appearing white female, in no obvious distress. VITAL SIGNS:  Temperature of 98.4, pulse 90, respiratory rate 16, blood pressure 130/46, weight is 148 pounds. HEAD AND NECK:  Normocephalic, atraumatic skull.  There are no ocular or oral lesions.  She has no thrush.  There is no adenopathy in the neck. Conjunctivae are slightly pale. LUNGS:  With some slight decrease at the bases. CARDIAC:  Regular rate and rhythm with a normal S1 and S2.  There are no murmurs, rubs, or bruits. ABDOMEN:  Soft and good bowel sounds.  There is no palpable abdominal mass.  There is no palpable hepatosplenomegaly. EXTREMITIES:  The erythema and  warmth and swelling of the left lower leg.  The erythema extends up about 3/4th way of the left lower leg. She has swelling.  This is tender to palpation.  Right lower extremity showed some patchy areas of erythema. NEUROLOGICAL:  No focal neurological deficits.  LABORATORY STUDIES:  White cell count 0.7, hemoglobin 6.7, platelet count 45,000.  Sodium 134, potassium 3.9, BUN 28, creatinine 1.29. Calcium 7.9 with an albumin of 2.7.  IMPRESSION:  Jamie Munoz is a very charming 79 year old, white female with a metastatic lung cancer.  She opts to done incredibly well.  She had been diagnosed back in 2006.  As usual, Dr. Arbutus Ped has done a fantastic job in managing her and given her excellent quality of life, and quantity of life.  I think the Neupogen is a good idea.  Her blood counts are quite low.  I am very concerned, however, this left  leg.  She is currently on vancomycin.  I really believe that she probably needs some gram-negative coverage.  One has to be worried about the positive of gram-negative infection with her being neutropenic.  As such, I think aminoglycosides or possibly aztreonam may not be a bad idea.  I just for good coverage of gram-negatives.  She is being transfused with 2 units of blood, this is appropriate.  She does not need platelets.  She is not bleeding.  We will follow her along while she is in the hospital.  When I think that once her white cell count recovers, that the cellulitis will improve.  I do not think she has osteomyelitis, but I think the MRI is a fantastic idea.  Again, we will follow her while in the hospital.  Apparently, we did have an excellent prayer session.  Jamie Munoz is a very strong faith.     Josph Macho, M.D.     PRE/MEDQ  D:  08/10/2012  T:  08/11/2012  Job:  [id number]

## 2012-08-12 DIAGNOSIS — C3410 Malignant neoplasm of upper lobe, unspecified bronchus or lung: Secondary | ICD-10-CM

## 2012-08-12 DIAGNOSIS — D6959 Other secondary thrombocytopenia: Secondary | ICD-10-CM

## 2012-08-12 LAB — PREPARE PLATELET PHERESIS: Unit division: 0

## 2012-08-12 LAB — DIFFERENTIAL
Basophils Absolute: 0 10*3/uL (ref 0.0–0.1)
Eosinophils Relative: 3 % (ref 0–5)
Lymphocytes Relative: 8 % — ABNORMAL LOW (ref 12–46)
Lymphs Abs: 0.4 10*3/uL — ABNORMAL LOW (ref 0.7–4.0)
Monocytes Relative: 3 % (ref 3–12)

## 2012-08-12 LAB — CBC
MCV: 97.6 fL (ref 78.0–100.0)
Platelets: 44 10*3/uL — ABNORMAL LOW (ref 150–400)
RBC: 2.45 MIL/uL — ABNORMAL LOW (ref 3.87–5.11)
WBC: 4.4 10*3/uL (ref 4.0–10.5)

## 2012-08-12 LAB — OCCULT BLOOD X 1 CARD TO LAB, STOOL: Fecal Occult Bld: NEGATIVE

## 2012-08-12 NOTE — Progress Notes (Signed)
[facility] PROGRESS NOTE  Jamie Munoz XBJ:[id number] DOB: [birth year] DOA: 08/10/2012 PCP: [name], MD  Assessment/Plan: 1. Left Lower extremity Cellulitis /Mild right  LE cellulitis: Continue with IV antibiotics, vancomycin and Aztreonam day 3. Wound care consulted. MRI negative for abscess, fascitis.  2. Pancytopenia, acute blood loss anemia secondary to chemotherapy. WBC increase to 4. Received 2 units PRBC 2-21. Continue to monitor CBC. No evidence of bleeding. Neupogen stopped 2-23. Appreciate Dr Arbutus Ped help.  3. Hyponatremia: Continue with IV fluids.  4. Acute renal insufficiency: improved with IV fluids.  5. Pruritus: Benadryl PRN. Benadryl lotion.  6-   Metastatic non-small cell lung cancer: appreciate Dr       Arbutus Ped recommendation. He will consider to hold       chemotherapy until cellulitis improved.  7-Delirium: Patient had episode of confusion yesterday afternoon. This morning she is oriented to place and situation. Will change  benadryl doses.    Code Status: Full.  Family Communication: Care discussed with patient.  Disposition Plan: Remain in patient, home at time of discharge.    Consultants:  Dr Myna Hidalgo Dr Arbutus Ped.  Procedures:  None.  Antibiotics:  Vancomycin 2-21  Aztreonam 2-21.   HPI/Subjective: Redness lower extremities similar than yesterday. She was able to tell me that she was confuse yesterday. She is appropriate this morning.    Objective: Filed Vitals:   08/11/12 1429 08/11/12 1630 08/11/12 2106 08/12/12 0608  BP: 117/42  126/47 121/49  Pulse: 101  96 89  Temp: 99.5 F (37.5 C) 99.2 F (37.3 C) 99.1 F (37.3 C) 98.4 F (36.9 C)  TempSrc: Oral Oral Oral Oral  Resp: 16  16 16   Height:      Weight:      SpO2: 98%  98% 93%    Intake/Output Summary (Last 24 hours) at 08/12/12 1131 Last data filed at 08/12/12 0600  Gross per 24 hour  Intake   2355 ml  Output    750 ml  Net   1605 ml   Filed Weights   08/10/12  1654 08/10/12 1726  Weight: 63.7 kg (140 lb 6.9 oz) 62.9 kg (138 lb 10.7 oz)    Exam:   General:  No distress.   Cardiovascular: S 1, S 2 RRR  Respiratory: CTA  Abdomen: BS present, soft, NT  Extremities: Left  LE with significant erythema, swelling. Right with some erythema.   Data Reviewed: Basic Metabolic Panel:  Recent Labs Lab 08/10/12 1322 08/11/12 0600  NA 134* 131*  K 3.9 3.6  CL 98 101  CO2 24 22  GLUCOSE 103* 74  BUN 28* 23  CREATININE 1.29* 1.10  CALCIUM 7.9* 7.2*   Liver Function Tests:  Recent Labs Lab 08/10/12 1322  AST 30  ALT 15  ALKPHOS 78  BILITOT 0.6  PROT 6.3  ALBUMIN 2.7*   CBC:  Recent Labs Lab 08/10/12 1322 08/11/12 0600 08/12/12 0653  WBC 0.7* 1.3* 4.4  NEUTROABS  --  0.8* 3.8  HGB 6.7* 7.8* 8.3*  HCT 19.2* 22.0* 23.9*  MCV 100.5* 96.1 97.6  PLT 45* 53* 44*   Cardiac Enzymes: No results found for this basename: CKTOTAL, CKMB, CKMBINDEX, TROPONINI,  in the last 168 hours BNP (last 3 results) No results found for this basename: PROBNP,  in the last 8760 hours CBG: No results found for this basename: GLUCAP,  in the last 168 hours  No results found for this or any previous visit (from the past 240 hour(s)).  Studies: Mr Tibia Fibula Left Wo Contrast  08/11/2012  *RADIOLOGY REPORT*  Clinical Data: Left lower extremity pain and swelling.  MRI LEFT LOWER EXTREMITY WITHOUT CONTRAST  Technique:  Multiplanar, multisequence MR imaging of the left lower extremity was performed.  No intravenous contrast was administered.  Comparison:   None  Findings:  There is diffuse subcutaneous soft tissue swelling/edema and fluid involving entire left lower extremity most likely reflecting cellulitis.  No discrete soft tissue abscess.  No findings for myofasciitis or pyomyositis.  There are areas of focal fatty atrophy involving the calf musculature.  This could be related to previous muscle infarcts or denervation process.  No findings to  suggest septic arthritis involving the medial ankle joints and no evidence of osteomyelitis involving the tibia or fibula.  IMPRESSION:  1.  Diffuse left lower extremity cellulitis without focal soft tissue abscess. 2.  No findings for myositis, pyomyositis, septic arthritis or osteomyelitis. 3.  Areas of focal fatty atrophy involving the calf musculature likely due to prior muscle infarcts or denervation process.   Original Report Authenticated By: Rudie Meyer, M.D.     Scheduled Meds: . aztreonam  1 g Intravenous Q8H  . cyproheptadine  4 mg Oral Once  . docusate sodium  100 mg Oral BID  . gabapentin  200 mg Oral TID  . OxyCODONE  20 mg Oral QHS  . sodium chloride  3 mL Intravenous Q12H  . vancomycin  1,250 mg Intravenous Q24H   Continuous Infusions: . sodium chloride 75 mL/hr at 08/11/12 1758    Active Problems:   Cellulitis of left lower extremity   Acute kidney injury   Non-small cell carcinoma of lung   Pancytopenia    Time spent: 25 minutes.    Kamiah Fite  [facility] Pager [phone]. If 8PM-8AM, please contact night-coverage at [email or web address], password [facility] 08/12/2012, 11:31 AM  LOS: 2 days

## 2012-08-12 NOTE — Consult Note (Signed)
WOC consult Note Reason for Consult:Consult received for assessment and suggestions for care to LLE erythema and edema.  Today both LEs are involved, albeit L>R Wound type:infectious vs drug interaction vs venous insufficiency Pressure Ulcer POA: No Measurement:No open wounds on either LE Wound bed:N/A Drainage (amount, consistency, odor) None Periwound:erythematous, mild warmth.  Patient states that LE edema reduces with elevation. Dressing procedure/placement/frequency: Patient with systemic antibiotic currently. LEs are elevated, but not much.,  I will ask Nursing to increase elevation as tolerated.  I have no suggestions for topical care at this time. I will not follow.  Please re-consult if needed. Thanks, Ladona Mow, MSN, RN, [facility], CWOCN [phone])

## 2012-08-12 NOTE — Progress Notes (Signed)
DIAGNOSIS: Metastatic non-small cell lung cancer (T2 NX M1). Presented with right upper lobe lung mass as well as multiple pulmonary nodules and bone metastasis at L3 vertebral body. This was diagnosed in March of 2006.   PRIOR THERAPY:  1. Status post palliative radiotherapy to the L3 lesion under the care of Dr. Kathrynn Running completed September 17, 2004. 2. Status post 6 cycles of systemic chemotherapy with carboplatin and paclitaxel. Last dose was given January 01, 2005. 3. Status post palliative radiotherapy to the right upper lobe lung mass under the care of Dr. Kathrynn Running completed on 12/29/2010. 4. Status post treatment with Tarceva 150 mg p.o. daily started April 2007 through July 2012. 5. Tarceva 100 mg p.o. daily started July 2012. 6. Systemic chemotherapy with Carboplatin for AUC 5 and Alimta 500 mg/m2 given every 3 weeks. Status post 6 cycles.  CURRENT THERAPY:  1. Maintenance chemotherapy with single agent Alimta at 500 mg per meter squared given every 3 weeks, first cycle beginning today 2. Zometa 4 mg IV q. 3 months for bone metastasis  Subjective: The patient is seen and examined today. Her friend was at the bedside. She is feeling fine today except for the swelling and erythema of the lower extremities left more than right. She denied having any significant fever or chills. She denied having any nausea or vomiting. She has no chest pain, shortness breath, cough or hemoptysis. Her white blood count improved to the normal range but the patient continues to have thrombocytopenia.  Objective: Vital signs in last 24 hours: Temp:  [98.4 F (36.9 C)-99.5 F (37.5 C)] 98.4 F (36.9 C) (02/23 1610) Pulse Rate:  [89-101] 89 (02/23 0608) Resp:  [16] 16 (02/23 0608) BP: (117-126)/(42-49) 121/49 mmHg (02/23 0608) SpO2:  [93 %-98 %] 93 % (02/23 0608)  Intake/Output from previous day: 02/22 0701 - 02/23 0700 In: 2355 [P.O.:480; I.V.:1725; IV Piggyback:150] Out: 750 [Urine:750] Intake/Output this  shift:    General appearance: alert, cooperative and no distress Resp: clear to auscultation bilaterally Cardio: regular rate and rhythm, S1, S2 normal, no murmur, click, rub or gallop GI: soft, non-tender; bowel sounds normal; no masses,  no organomegaly Extremities: Bilaterally edema and erythema of the lower extremities left more than right  Lab Results:   Recent Labs  08/11/12 0600 08/12/12 0653  WBC 1.3* 4.4  HGB 7.8* 8.3*  HCT 22.0* 23.9*  PLT 53* 44*   BMET  Recent Labs  08/10/12 1322 08/11/12 0600  NA 134* 131*  K 3.9 3.6  CL 98 101  CO2 24 22  GLUCOSE 103* 74  BUN 28* 23  CREATININE 1.29* 1.10  CALCIUM 7.9* 7.2*    Studies/Results: Mr Tibia Fibula Left Wo Contrast  08/11/2012  *RADIOLOGY REPORT*  Clinical Data: Left lower extremity pain and swelling.  MRI LEFT LOWER EXTREMITY WITHOUT CONTRAST  Technique:  Multiplanar, multisequence MR imaging of the left lower extremity was performed.  No intravenous contrast was administered.  Comparison:   None  Findings:  There is diffuse subcutaneous soft tissue swelling/edema and fluid involving entire left lower extremity most likely reflecting cellulitis.  No discrete soft tissue abscess.  No findings for myofasciitis or pyomyositis.  There are areas of focal fatty atrophy involving the calf musculature.  This could be related to previous muscle infarcts or denervation process.  No findings to suggest septic arthritis involving the medial ankle joints and no evidence of osteomyelitis involving the tibia or fibula.  IMPRESSION:  1.  Diffuse left lower extremity cellulitis  without focal soft tissue abscess. 2.  No findings for myositis, pyomyositis, septic arthritis or osteomyelitis. 3.  Areas of focal fatty atrophy involving the calf musculature likely due to prior muscle infarcts or denervation process.   Original Report Authenticated By: Rudie Meyer, M.D.     Medications: I have reviewed the patient's current  medications.  Assessment/Plan: 1) metastatic non-small cell lung cancer diagnosed in March of 2006 status post several treatment and currently on maintenance chemotherapy with single agent Alimta. I may consider holding her next treatment until improvement in her cellulitis. 2) lower extremity cellulitis: There was no evidence of myositis, arthritis or osteomyelitis on the recent MRI. Continue current antibiotic coverage with vancomycin and Azactam.  3) pain management: Continue OxyContin and Neurontin. 4) neutropenia: Resolved. Discontinue Neupogen. 5) thrombocytopenia: Secondary to recent chemotherapy and infection. Will continue to monitor closely.   LOS: 2 days    Trevyn Lumpkin K. 08/12/2012

## 2012-08-13 DIAGNOSIS — E876 Hypokalemia: Secondary | ICD-10-CM

## 2012-08-13 LAB — CBC WITH DIFFERENTIAL/PLATELET
Basophils Relative: 0 % (ref 0–1)
Eosinophils Relative: 1 % (ref 0–5)
Hemoglobin: 8.6 g/dL — ABNORMAL LOW (ref 12.0–15.0)
Lymphocytes Relative: 7 % — ABNORMAL LOW (ref 12–46)
MCH: 34.4 pg — ABNORMAL HIGH (ref 26.0–34.0)
Monocytes Relative: 3 % (ref 3–12)
Neutrophils Relative %: 89 % — ABNORMAL HIGH (ref 43–77)
Platelets: 35 10*3/uL — ABNORMAL LOW (ref 150–400)
RBC: 2.5 MIL/uL — ABNORMAL LOW (ref 3.87–5.11)
WBC: 8.4 10*3/uL (ref 4.0–10.5)

## 2012-08-13 LAB — BASIC METABOLIC PANEL
CO2: 21 mEq/L (ref 19–32)
Calcium: 6.5 mg/dL — ABNORMAL LOW (ref 8.4–10.5)
Chloride: 107 mEq/L (ref 96–112)
Glucose, Bld: 83 mg/dL (ref 70–99)
Potassium: 3.6 mEq/L (ref 3.5–5.1)
Sodium: 137 mEq/L (ref 135–145)

## 2012-08-13 NOTE — Progress Notes (Signed)
[facility] PROGRESS NOTE  Jamie Munoz ZOX:[id number] DOB: Nov 06, 1926 DOA: 08/10/2012 PCP: [name], MD  Assessment/Plan: 1. Left Lower extremity Cellulitis /Mild right  LE cellulitis: Continue with IV antibiotics, vancomycin and Aztreonam day 4. Wound care consulted. MRI negative for abscess, fascitis.       If no improvement by 2-25 will consider ID consult.   2. Pancytopenia, acute blood loss anemia secondary to chemotherapy. WBC increase to 8. Received 2 units PRBC 2-21. Continue to monitor CBC. No evidence of bleeding. Neupogen stopped 2-23. Appreciate Dr Arbutus Ped help.  3. Hyponatremia: resolved with  IV fluids.  4. Acute renal insufficiency: improved with IV fluids.  5. Pruritus: Benadryl PRN. Benadryl lotion.  6-   Metastatic non-small cell lung cancer: appreciate Dr. Arbutus Ped recommendation.  He is        Considering  to hold chemotherapy until cellulitis improved.  7-   Delirium: Patient had episode of confusion 2-23. Oriented to place and situation.                           Improved.  Code Status: Full.  Family Communication: Care discussed with patient.  Disposition Plan: Remain in patient, home at time of discharge.    Consultants:  Dr Myna Hidalgo Dr Arbutus Ped.  Procedures:  None.  Antibiotics:  Vancomycin 2-21  Aztreonam 2-21.   HPI/Subjective: Redness LE same. No significant pain.    Objective: Filed Vitals:   08/12/12 1405 08/12/12 2101 08/13/12 0615 08/13/12 1423  BP: 122/47 130/43 120/45 115/52  Pulse: 87 90 92 83  Temp: 98.7 F (37.1 C) 99.1 F (37.3 C) 98.8 F (37.1 C) 98.9 F (37.2 C)  TempSrc: Oral Oral Oral Oral  Resp: 16 16 16 16   Height:      Weight:      SpO2: 98% 95% 96% 91%    Intake/Output Summary (Last 24 hours) at 08/13/12 1513 Last data filed at 08/13/12 1300  Gross per 24 hour  Intake   3490 ml  Output   1450 ml  Net   2040 ml   Filed Weights   08/10/12 1654 08/10/12 1726  Weight: 63.7 kg (140 lb 6.9 oz) 62.9  kg (138 lb 10.7 oz)    Exam:   General:  No distress.   Cardiovascular: S 1, S 2 RRR  Respiratory: CTA  Abdomen: BS present, soft, NT  Extremities: Left  LE with significant erythema, swelling. Right with some erythema.   Data Reviewed: Basic Metabolic Panel:  Recent Labs Lab 08/10/12 1322 08/11/12 0600 08/13/12 0600  NA 134* 131* 137  K 3.9 3.6 3.6  CL 98 101 107  CO2 24 22 21   GLUCOSE 103* 74 83  BUN 28* 23 13  CREATININE 1.29* 1.10 1.09  CALCIUM 7.9* 7.2* 6.5*   Liver Function Tests:  Recent Labs Lab 08/10/12 1322  AST 30  ALT 15  ALKPHOS 78  BILITOT 0.6  PROT 6.3  ALBUMIN 2.7*   CBC:  Recent Labs Lab 08/10/12 1322 08/11/12 0600 08/12/12 0653 08/13/12 0600  WBC 0.7* 1.3* 4.4 8.4  NEUTROABS  --  0.8* 3.8 7.4  HGB 6.7* 7.8* 8.3* 8.6*  HCT 19.2* 22.0* 23.9* 24.6*  MCV 100.5* 96.1 97.6 98.4  PLT 45* 53* 44* 35*   Cardiac Enzymes: No results found for this basename: CKTOTAL, CKMB, CKMBINDEX, TROPONINI,  in the last 168 hours BNP (last 3 results) No results found for this basename: PROBNP,  in the  last 8760 hours CBG: No results found for this basename: GLUCAP,  in the last 168 hours  Recent Results (from the past 240 hour(s))  CULTURE, BLOOD (ROUTINE X 2)     Status: None   Collection Time    08/10/12 11:40 PM      Result Value Range Status   Specimen Description BLOOD RIGHT ANTECUBITAL   Final   Special Requests BOTTLES DRAWN AEROBIC AND ANAEROBIC 2.5CC   Final   Culture  Setup Time 08/11/2012 03:55   Final   Culture     Final   Value:        BLOOD CULTURE RECEIVED NO GROWTH TO DATE CULTURE WILL BE HELD FOR 5 DAYS BEFORE ISSUING A FINAL NEGATIVE REPORT   Report Status PENDING   Incomplete  CULTURE, BLOOD (ROUTINE X 2)     Status: None   Collection Time    08/10/12 11:45 PM      Result Value Range Status   Specimen Description BLOOD LEFT ANTECUBITAL   Final   Special Requests BOTTLES DRAWN AEROBIC AND ANAEROBIC 5CC   Final   Culture   Setup Time 08/11/2012 03:42   Final   Culture     Final   Value:        BLOOD CULTURE RECEIVED NO GROWTH TO DATE CULTURE WILL BE HELD FOR 5 DAYS BEFORE ISSUING A FINAL NEGATIVE REPORT   Report Status PENDING   Incomplete     Studies: No results found.  Scheduled Meds: . aztreonam  1 g Intravenous Q8H  . cyproheptadine  4 mg Oral Once  . docusate sodium  100 mg Oral BID  . gabapentin  200 mg Oral TID  . OxyCODONE  20 mg Oral QHS  . sodium chloride  3 mL Intravenous Q12H  . vancomycin  1,250 mg Intravenous Q24H   Continuous Infusions: . sodium chloride 75 mL/hr at 08/13/12 0700    Active Problems:   Cellulitis of left lower extremity   Acute kidney injury   Non-small cell carcinoma of lung   Pancytopenia    Time spent: 25 minutes.    Jamie Munoz  [facility] Pager [phone]. If 8PM-8AM, please contact night-coverage at [email or web address], password [facility] 08/13/2012, 3:13 PM  LOS: 3 days

## 2012-08-13 NOTE — Care Management (Signed)
CM spoke with patient with sister at bedside concerning discharge planning. Pt states from home. PTA independent. Per pt sister and family assist with home care. Per pt no HH services or DME required upon discharge. CM will sign off.   Roxy Manns Lynett Brasil,RN,BSN [phone]

## 2012-08-14 DIAGNOSIS — L03119 Cellulitis of unspecified part of limb: Secondary | ICD-10-CM

## 2012-08-14 DIAGNOSIS — R0989 Other specified symptoms and signs involving the circulatory and respiratory systems: Secondary | ICD-10-CM

## 2012-08-14 DIAGNOSIS — L02419 Cutaneous abscess of limb, unspecified: Secondary | ICD-10-CM

## 2012-08-14 LAB — BASIC METABOLIC PANEL
CO2: 21 mEq/L (ref 19–32)
Chloride: 107 mEq/L (ref 96–112)
GFR calc Af Amer: 58 mL/min — ABNORMAL LOW (ref >90)
Potassium: 3.6 mEq/L (ref 3.5–5.1)

## 2012-08-14 LAB — CBC WITH DIFFERENTIAL/PLATELET
Basophils Relative: 0 % (ref 0–1)
Eosinophils Relative: 2 % (ref 0–5)
Hemoglobin: 8.3 g/dL — ABNORMAL LOW (ref 12.0–15.0)
Lymphs Abs: 0.5 10*3/uL — ABNORMAL LOW (ref 0.7–4.0)
MCH: 34.6 pg — ABNORMAL HIGH (ref 26.0–34.0)
MCV: 98.3 fL (ref 78.0–100.0)
Monocytes Absolute: 0.5 10*3/uL (ref 0.1–1.0)
Monocytes Relative: 6 % (ref 3–12)
Platelets: 30 10*3/uL — ABNORMAL LOW (ref 150–400)
RBC: 2.4 MIL/uL — ABNORMAL LOW (ref 3.87–5.11)
WBC: 7.8 10*3/uL (ref 4.0–10.5)

## 2012-08-14 MED ORDER — CALCIUM CARBONATE 1250 (500 CA) MG PO TABS
500.0000 mg | ORAL_TABLET | Freq: Two times a day (BID) | ORAL | Status: DC
  Administered 2012-08-14 – 2012-08-18 (×10): 500 mg via ORAL
  Filled 2012-08-14 (×12): qty 1

## 2012-08-14 NOTE — Progress Notes (Addendum)
ANTIBIOTIC CONSULT NOTE  Pharmacy Consult for Vancomycin Indication: Cellulitis   Allergies  Allergen Reactions  . Codeine Shortness Of Breath    Patient Measurements: Height: 5\' 6"  (167.6 cm) Weight: 138 lb 10.7 oz (62.9 kg) IBW/kg (Calculated) : 59.3  Vital Signs: Temp: 98.4 F (36.9 C) (02/25 0401) Temp src: Oral (02/25 0401) BP: 122/82 mmHg (02/25 0401) Pulse Rate: 87 (02/25 0401) Intake/Output from previous day: 02/24 0701 - 02/25 0700 In: 1500 [P.O.:700; I.V.:750; IV Piggyback:50] Out: 1150 [Urine:1150] Intake/Output from this shift: Total I/O In: 240 [P.O.:240] Out: 100 [Urine:100]  Labs:  Recent Labs  08/12/12 0653 08/13/12 0600 08/14/12 0410  WBC 4.4 8.4 7.8  HGB 8.3* 8.6* 8.3*  PLT 44* 35* 30*  CREATININE  --  1.09 1.01   Estimated Creatinine Clearance: 39.5 ml/min (by C-G formula based on Cr of 1.01). No results found for this basename: VANCOTROUGH, VANCOPEAK, VANCORANDOM, GENTTROUGH, GENTPEAK, GENTRANDOM, TOBRATROUGH, TOBRAPEAK, TOBRARND, AMIKACINPEAK, AMIKACINTROU, AMIKACIN,  in the last 72 hours   Microbiology: Recent Results (from the past 720 hour(s))  CULTURE, BLOOD (ROUTINE X 2)     Status: None   Collection Time    08/10/12 11:40 PM      Result Value Range Status   Specimen Description BLOOD RIGHT ANTECUBITAL   Final   Special Requests BOTTLES DRAWN AEROBIC AND ANAEROBIC 2.5CC   Final   Culture  Setup Time 08/11/2012 03:55   Final   Culture     Final   Value:        BLOOD CULTURE RECEIVED NO GROWTH TO DATE CULTURE WILL BE HELD FOR 5 DAYS BEFORE ISSUING A FINAL NEGATIVE REPORT   Report Status PENDING   Incomplete  CULTURE, BLOOD (ROUTINE X 2)     Status: None   Collection Time    08/10/12 11:45 PM      Result Value Range Status   Specimen Description BLOOD LEFT ANTECUBITAL   Final   Special Requests BOTTLES DRAWN AEROBIC AND ANAEROBIC 5CC   Final   Culture  Setup Time 08/11/2012 03:42   Final   Culture     Final   Value:        BLOOD  CULTURE RECEIVED NO GROWTH TO DATE CULTURE WILL BE HELD FOR 5 DAYS BEFORE ISSUING A FINAL NEGATIVE REPORT   Report Status PENDING   Incomplete    Medical History: Past Medical History  Diagnosis Date  . Cancer   . Lung cancer   . Hypertension   . Cellulitis of left lower extremity 07/16/2012    Assessment: 6 yof with h/o metastatic NSCLC (currently receiving chemotherapy) presented 2/21 with increasing bilateral LE pain/erythema.  Cellulitis initiated in early January 2014, patient was hospitalized between 07/17/12 - 07/19/12 where she was placed on IV clindamycin and transitioned to oral clindamycin on discharge.  Patient went to PCP 08/08/2012 and was prescribed PO Clindamycin with no improvement. Given recent chemotherapy, patient found pancytopenic.  Day #5 vancomycin and azactam.  SCr improved since admission to SCr 1.01 (appears to be near baseline).  CG CrCl 40 ml/min   Patient is afebrile, WBC improving now as well (WBC 1.3, ANC 0.8).  Blood cultures sent.  Will f/u results.  Goal of Therapy:  Vancomycin trough level 10-15 mcg/ml  Plan:  1) Continue Vancomycin 1250mg  IV q24 for now 2) Check vanc trough tonight 3) ID consult pending 4) Continue current Azactam dosing   Hessie Knows, PharmD, BCPS Pager [phone] 08/14/2012 10:46 AM

## 2012-08-14 NOTE — Clinical Documentation Improvement (Signed)
CHANGE MENTAL STATUS DOCUMENTATION CLARIFICATION   THIS DOCUMENT IS NOT A PERMANENT PART OF THE MEDICAL RECORD  TO RESPOND TO THE THIS QUERY, FOLLOW THE INSTRUCTIONS BELOW:  1. If needed, update documentation for the patient's encounter via the notes activity.  2. Access this query again and click edit on the In [employer].  3. After updating, or not, click F2 to complete all highlighted (required) fields concerning your review. Select "additional documentation in the medical record" OR "no additional documentation provided".  4. Click Sign note button.  5. The deficiency will fall out of your In Basket *Please let us know if you are not able to complete this workflow by phone or e-mail (listed below).         08/14/12  Dear Dr. Sunnie Nielsen, Leonard Schwartz Marton Redwood  In an effort to better capture your patient's severity of illness, reflect appropriate length of stay and utilization of resources, a review of the patient medical record has revealed the following indicators.    Based on your clinical judgment, please clarify and document in a progress note and/or discharge summary the clinical condition associated with the following supporting information:  In responding to this query please exercise your independent judgment.  The fact that a query is asked, does not imply that any particular answer is desired or expected.  Pt admitted with Cellulitis and non-small cell lung ca  According to pn pt with delirium and confusion treated with Xanax.  Clarification Needed    Please clarify if delirium and confusion necessitating the treatment of Xanax can be further specified as one of the diagnoses listed below and document in pn or d/c summary.     Possible Clinical Conditions?  _______Encephalopathy (describe type if known)                       Anoxic                       Septic                       Alcoholic                        Hepatic                       Hypertensive               Metabolic                       _______ Toxic  _______Drug induced Encephalopaty  _______Other Condition__________________ _______Cannot Clinically Determine   Supporting Information:  Risk Factors: Pancytopenia 2/2 chemo, HTN, AKI, non-small cell cancer of the lung, Cellulitis, Hyponatremia , Delirium, confusion  Signs & Symptoms:  Diagnostics: Component     Latest Ref Rng 08/10/2012         1:22 PM  WBC     4.0 - 10.5 K/uL 0.7 (LL)   Component     Latest Ref Rng 08/11/2012          WBC     4.0 - 10.5 K/uL 1.3 (LL)    Component     Latest Ref Rng 08/10/2012         1:22 PM  WBC Morphology      MILD LEFT SHIFT (1-5% METAS, OCC MYELO, OCC BANDS)   Treatment: Xanax  Reviewed: additional documentation in the medical record  Thank You,  Richardine Service. Ambrose Mantle RN, BSN, MSN/Inf, CCDS Clinical Documentation Specialist [facility] Pager: [phone]  Health Information Management [place]

## 2012-08-14 NOTE — Progress Notes (Signed)
Patient ID: Jamie Munoz, female   DOB: 02-12-1927, 79 y.o.   MRN: [id number] Asked by Dr.Regalado to evaluate pt's port a cath for possible infection. Pt with hx NSC lung carcinoma and placement of rt IJ port a cath in 2006 by IR. Port has functioned well and only recently did pt notice some redness around port. On exam, there is a small ring of erythema along edge of port where tegaderm dressing pulls at skin. There is no sig drainage or edema noted. Port site is minimally tender. WBC is currently normal and temp is 98.4. Blood cx's are pending. Pt is currently on IV antibiotic therapy. Pt also seen by Dr. Archer Asa. At this time recommend continued close observation of site and replacement of current tegaderm dressing with some laxity to skin surface to see if erythema resolves. Pt has hx of skin irritation with certain tapes . If blood cx's return as positive or pt develops increased edema, drainage, pain at port site or significant fever , we will reassess for possible removal.

## 2012-08-14 NOTE — Consult Note (Signed)
[facility]  Total days of antibiotics 5        Day 5 aztreo        Day 5 vanco       Reason for Consult:cellulitis in immunocompromised host   Referring Physician: regalado  Active Problems:   Cellulitis of left lower extremity   Acute kidney injury   Non-small cell carcinoma of lung   Pancytopenia    HPI: Jamie Munoz is a 79 y.o. female  with Metastatic non-small cell lung cancer (T2 NX M1). Presented with right upper lobe lung mass as well as multiple pulmonary nodules and bone metastasis at L3 vertebral body in March 2006, Status post palliative radiotherapy to the L3 lesion, s/p  6 cycles of carboplatin and paclitaxel- ending July 2006.Status post treatment with Tarceva 150 mg p.o. daily started April 2007 through July 2012. S/p  chemotherapy with Carboplatin for AUC 5 and Alimta 500 mg/m2 given every 3 weeks. X  6 cycles. Now on maintenance with  Alimta given on 2/11.  She was hospitalized at end of jan for 2 days treated with Iv clinda-transitioned to oral for her left lower extremity cellulitis. She states that the symptoms are significantly better although she still has some mild swelling. Admitted after failing keflex, levo and ceftriaxone x 1 dos in Jan. She was admitted now for 3 days of worsening symptoms despite starting clindamycin 2 days ago. Found to have pancytopenia with wbc 0.7, hgb 6.7 and plt 45K. Given GCSF. Dopplers negative in end Jan. abi showed mild arterial disease in right leg. Started on vanco and aztreonam for cellulitis, but also concerning for non-infectious processes such as leukocytoclastic vasculitis. 2 sets of blood cx NGTD but had already started on clindamycin 2 days prior to admit. Underwent MRI that shows diffuse cellulitis no absces     Past Medical History  Diagnosis Date  . Cancer   . Lung cancer   . Hypertension   . Cellulitis of left lower extremity 07/16/2012    Allergies:  Allergies  Allergen Reactions  .  Codeine Shortness Of Breath   MEDICATIONS: . aztreonam  1 g Intravenous Q8H  . calcium carbonate  500 mg of elemental calcium Oral BID WC  . cyproheptadine  4 mg Oral Once  . gabapentin  200 mg Oral TID  . OxyCODONE  20 mg Oral QHS  . sodium chloride  3 mL Intravenous Q12H  . vancomycin  1,250 mg Intravenous Q24H    History  Substance Use Topics  . Smoking status: Never Smoker   . Smokeless tobacco: Never Used  . Alcohol Use: No    Family History  Problem Relation Age of Onset  . Cancer Mother   . Heart attack Father   . Cancer Brother   . Cancer Brother   . Cancer Brother     ROS: Constitutional: Negative for fever, chills, diaphoresis, activity change, appetite change, fatigue and unexpected weight change.  HENT: Negative for congestion, sore throat, rhinorrhea, sneezing, trouble swallowing and sinus pressure.  Eyes: Negative for photophobia and visual disturbance.  Respiratory: Negative for cough, chest tightness, shortness of breath, wheezing and stridor.  Cardiovascular: Negative for chest pain, palpitations and leg swelling.  Gastrointestinal: Negative for nausea, vomiting, abdominal pain, diarrhea, constipation, blood in stool, abdominal distention and anal bleeding.  Genitourinary: Negative for dysuria, hematuria, flank pain and difficulty urinating.  Musculoskeletal: Negative for myalgias, back pain, joint swelling, arthralgias and gait problem.  Skin: per  hpi, also has diffuse itching of her skin, which she uses wet cold cloth to help symptomatic relief Neurological: Negative for dizziness, tremors, weakness and light-headedness.  Hematological: Negative for adenopathy. Does not bruise/bleed easily.  Psychiatric/Behavioral: Negative for behavioral problems, confusion, sleep disturbance, dysphoric mood, decreased concentration and agitation.     OBJECTIVE: Temp:  [98.4 F (36.9 C)-99.3 F (37.4 C)] 98.4 F (36.9 C) (02/25 1357) Pulse Rate:  [79-87] 79 (02/25  1357) Resp:  [15-16] 16 (02/25 1357) BP: (110-123)/(47-82) 123/47 mmHg (02/25 1357) SpO2:  [96 %-97 %] 96 % (02/25 1357)  General: A&O x 3, NAD, nontoxic, pleasant/cooperative  Head/Eye: No conjunctival hemorrhage, no icterus, [place]/AT, No nystagmus  ENT: No icterus, No thrush, good dentition, no pharyngeal exudate, no mucositis, no oral ulcer  Neck: No masses, no lymphadenpathy, no bruits  CV: RRR, no rub, no gallop, no S3  Lung: CTAB, good air movement, no wheeze, no rhonchi ; right chest wall portacath= nontender Abdomen: soft/NT, +BS, nondistended, no peritoneal signs  Ext: Left lower extremity just below the knee with violaceous confluent erythematous nonblanching rash extending to the patient's ankle with mild edema. No open wounds. No crepitance. Compartment is soft.  But tender to palpation Right lower extremity marked pretibial violaceous rash with discrete macules with very small 2 mm black eschar type or lesion--no drainage, no crepitance, no necrosis, less swelling than left leg  LABS: Results for orders placed during the hospital encounter of 08/10/12 (from the past 48 hour(s))  CBC WITH DIFFERENTIAL     Status: Abnormal   Collection Time    08/13/12  6:00 AM      Result Value Range   WBC 8.4  4.0 - 10.5 K/uL   Comment: WHITE COUNT CONFIRMED ON SMEAR   RBC 2.50 (*) 3.87 - 5.11 MIL/uL   Hemoglobin 8.6 (*) 12.0 - 15.0 g/dL   HCT 16.1 (*) 09.6 - 04.5 %   MCV 98.4  78.0 - 100.0 fL   MCH 34.4 (*) 26.0 - 34.0 pg   MCHC 35.0  30.0 - 36.0 g/dL   RDW 40.9 (*) 81.1 - 91.4 %   Platelets 35 (*) 150 - 400 K/uL   Comment: SPECIMEN CHECKED FOR CLOTS     REPEATED TO VERIFY     PLATELET COUNT CONFIRMED BY SMEAR   Neutrophils Relative 89 (*) 43 - 77 %   Lymphocytes Relative 7 (*) 12 - 46 %   Monocytes Relative 3  3 - 12 %   Eosinophils Relative 1  0 - 5 %   Basophils Relative 0  0 - 1 %   Neutro Abs 7.4  1.7 - 7.7 K/uL   Lymphs Abs 0.6 (*) 0.7 - 4.0 K/uL   Monocytes Absolute 0.3  0.1  - 1.0 K/uL   Eosinophils Absolute 0.1  0.0 - 0.7 K/uL   Basophils Absolute 0.0  0.0 - 0.1 K/uL   WBC Morphology DOHLE BODIES    BASIC METABOLIC PANEL     Status: Abnormal   Collection Time    08/13/12  6:00 AM      Result Value Range   Sodium 137  135 - 145 mEq/L   Potassium 3.6  3.5 - 5.1 mEq/L   Chloride 107  96 - 112 mEq/L   CO2 21  19 - 32 mEq/L   Glucose, Bld 83  70 - 99 mg/dL   BUN 13  6 - 23 mg/dL   Creatinine, Ser 7.82  0.50 - 1.10 mg/dL  Calcium 6.5 (*) 8.4 - 10.5 mg/dL   GFR calc non Af Amer 46 (*) >90 mL/min   GFR calc Af Amer 53 (*) >90 mL/min   Comment:            The eGFR has been calculated     using the CKD EPI equation.     This calculation has not been     validated in all clinical     situations.     eGFR's persistently     <90 mL/min signify     possible Chronic Kidney Disease.  CBC WITH DIFFERENTIAL     Status: Abnormal   Collection Time    08/14/12  4:10 AM      Result Value Range   WBC 7.8  4.0 - 10.5 K/uL   RBC 2.40 (*) 3.87 - 5.11 MIL/uL   Hemoglobin 8.3 (*) 12.0 - 15.0 g/dL   HCT 96.2 (*) 95.2 - 84.1 %   MCV 98.3  78.0 - 100.0 fL   MCH 34.6 (*) 26.0 - 34.0 pg   MCHC 35.2  30.0 - 36.0 g/dL   RDW 32.4 (*) 40.1 - 02.7 %   Platelets 30 (*) 150 - 400 K/uL   Comment: REPEATED TO VERIFY     CONSISTENT WITH PREVIOUS RESULT   Neutrophils Relative 85 (*) 43 - 77 %   Lymphocytes Relative 7 (*) 12 - 46 %   Monocytes Relative 6  3 - 12 %   Eosinophils Relative 2  0 - 5 %   Basophils Relative 0  0 - 1 %   Neutro Abs 6.6  1.7 - 7.7 K/uL   Lymphs Abs 0.5 (*) 0.7 - 4.0 K/uL   Monocytes Absolute 0.5  0.1 - 1.0 K/uL   Eosinophils Absolute 0.2  0.0 - 0.7 K/uL   Basophils Absolute 0.0  0.0 - 0.1 K/uL   WBC Morphology DOHLE BODIES     Smear Review PLATELET COUNT CONFIRMED BY SMEAR    BASIC METABOLIC PANEL     Status: Abnormal   Collection Time    08/14/12  4:10 AM      Result Value Range   Sodium 136  135 - 145 mEq/L   Potassium 3.6  3.5 - 5.1 mEq/L    Chloride 107  96 - 112 mEq/L   CO2 21  19 - 32 mEq/L   Glucose, Bld 89  70 - 99 mg/dL   BUN 16  6 - 23 mg/dL   Creatinine, Ser 2.53  0.50 - 1.10 mg/dL   Calcium 6.3 (*) 8.4 - 10.5 mg/dL   Comment: CRITICAL RESULT CALLED TO, READ BACK BY AND VERIFIED WITH:     STRINGER,B/3E @0502  ON 08/14/12 BY KARCZEWSKI,S.   GFR calc non Af Amer 50 (*) >90 mL/min   GFR calc Af Amer 58 (*) >90 mL/min   Comment:            The eGFR has been calculated     using the CKD EPI equation.     This calculation has not been     validated in all clinical     situations.     eGFR's persistently     <90 mL/min signify     possible Chronic Kidney Disease.    MICRO: 2/21 blood cx x 2 NGTD IMAGING: No results found. MRI of left leg 1. Diffuse left lower extremity cellulitis without focal soft  tissue abscess.  2. No findings for myositis, pyomyositis, septic arthritis  or  osteomyelitis.  3. Areas of focal fatty atrophy involving the calf musculature  likely due to prior muscle infarcts or denervation process   Assessment/Plan:  79yo F with metastatic non small cell lung ca (T2NxM1) on alimta presented with worsening erythema of legs and pancytopenia. Started on vancomycin and aztreonam. 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 Does not look completely c/w icthyma gangrenosum occasionally associate with pseudomonas, not follicular as one would see with fungal infection in leukemics.  - recommend to discontinue aztreonam,continue on vancomycin alone - recommend to get skin biopsy by general surgery to send a portion for pathology as well as aerobic plus fungal culture - based upon path results, may consider using steroids if c/w Leukocytoclastic vasculitis  Welton Bord B. Drue Second MD MPH [facility] [phone]

## 2012-08-14 NOTE — Progress Notes (Signed)
VASCULAR LAB PRELIMINARY  ARTERIAL  ABI completed:    RIGHT    LEFT    PRESSURE WAVEFORM  PRESSURE WAVEFORM  BRACHIAL 149 Triphasic BRACHIAL 151 Triphasic  DP 155 Triphasic DP 136 Triphasic  AT   AT    PT 155 Biphasic PT 135 Biphasic  PER   PER    GREAT TOE  NA GREAT TOE  NA    RIGHT LEFT  ABI 1.03 0.90   The right ABI is within normal limits, the left ABI is suggestive of mild arterial insufficiency.  08/14/2012 3:58 PM Gertie Fey, RDMS, RDCS

## 2012-08-14 NOTE — Progress Notes (Addendum)
[facility] PROGRESS NOTE  SHIA DELAINE UEA:[id number] DOB: 06-25-1926 DOA: 08/10/2012 PCP: [name], MD  Assessment/Plan:  79 year old female with a history of metastatic non-small cell carcinoma of the lung, hypertension, anxiety presents with 3 day history of worsening left lower extremity edema, erythema, and pain. The patient has been struggling with this since early January. The patient was admitted to the hospital between 07/17/2012 until January 30. She was placed on clindamycin IV and discharged on oral clindamycin. Apparently there was some mild improvement in her erythema and edema. Unfortunately, things worsened again approximately 3 days prior to admission. Patient was admitted 2/21, she was neutropenic, pancytopenia. She was started on IV vancomycin and Aztreonam. She received Neupogen, stopped 2-23. No significant improvement in cellulitis. I consulted ID.     1. Left Lower extremity Cellulitis /Mild right  LE cellulitis: Continue with IV antibiotics, vancomycin and Aztreonam day 5. Wound care recommend leg elevation. MRI negative for abscess, fascitis. No significant improvement. I consulted Dr Drue Second with infections Diseases.      2. Pancytopenia, acute blood loss anemia secondary to chemotherapy. WBC increase to 7. Received 2 units PRBC 2-21. Continue to monitor CBC. No evidence of bleeding. Neupogen stopped 2-23. Appreciate Dr Arbutus Ped help.  3. Hyponatremia: resolved with  IV fluids.  4. Acute renal insufficiency: improved with IV fluids.  5. Pruritus: Benadryl PRN. Benadryl lotion.  6-   Metastatic non-small cell lung cancer: appreciate Dr. Arbutus Ped recommendation.  He is Considering  to hold chemotherapy until                   cellulitis improved.  7-   Delirium: Patient had episode of confusion 2-23. Oriented to place and situation.  Improved. 8-   Redness around port Catheter: will ask IR review catheter.  9-   Hypocalcemia: check albumin. Calcium supplement.    Code Status: Full.  Family Communication: Care discussed with patient.  Disposition Plan: Remain in patient, home at time of discharge.    Consultants:  Dr Myna Hidalgo Dr Arbutus Ped.  Procedures:  None.  Antibiotics:  Vancomycin 2-21  Aztreonam 2-21.   HPI/Subjective: Redness LE same. No significant pain.  Had 2 BM yesterday. She does not want to take stool softener today.     Objective: Filed Vitals:   08/13/12 0615 08/13/12 1423 08/13/12 2036 08/14/12 0401  BP: 120/45 115/52 110/62 122/82  Pulse: 92 83 86 87  Temp: 98.8 F (37.1 C) 98.9 F (37.2 C) 99.3 F (37.4 C) 98.4 F (36.9 C)  TempSrc: Oral Oral Oral Oral  Resp: 16 16 16 15   Height:      Weight:      SpO2: 96% 91% 97% 97%    Intake/Output Summary (Last 24 hours) at 08/14/12 0834 Last data filed at 08/14/12 4782  Gross per 24 hour  Intake   1740 ml  Output   1250 ml  Net    490 ml   Filed Weights   08/10/12 1654 08/10/12 1726  Weight: 63.7 kg (140 lb 6.9 oz) 62.9 kg (138 lb 10.7 oz)    Exam:   General:  No distress.   Cardiovascular: S 1, S 2 RRR  Respiratory: CTA  Abdomen: BS present, soft, NT  Extremities: Left  LE with significant erythema, swelling. Right with some erythema.   Data Reviewed: Basic Metabolic Panel:  Recent Labs Lab 08/10/12 1322 08/11/12 0600 08/13/12 0600 08/14/12 0410  NA 134* 131* 137 136  K 3.9 3.6 3.6 3.6  CL 98 101 107 107  CO2 24 22 21 21   GLUCOSE 103* 74 83 89  BUN 28* 23 13 16   CREATININE 1.29* 1.10 1.09 1.01  CALCIUM 7.9* 7.2* 6.5* 6.3*   Liver Function Tests:  Recent Labs Lab 08/10/12 1322  AST 30  ALT 15  ALKPHOS 78  BILITOT 0.6  PROT 6.3  ALBUMIN 2.7*   CBC:  Recent Labs Lab 08/10/12 1322 08/11/12 0600 08/12/12 0653 08/13/12 0600 08/14/12 0410  WBC 0.7* 1.3* 4.4 8.4 7.8  NEUTROABS  --  0.8* 3.8 7.4 6.6  HGB 6.7* 7.8* 8.3* 8.6* 8.3*  HCT 19.2* 22.0* 23.9* 24.6* 23.6*  MCV 100.5* 96.1 97.6 98.4 98.3  PLT 45* 53* 44* 35*  30*   Cardiac Enzymes: No results found for this basename: CKTOTAL, CKMB, CKMBINDEX, TROPONINI,  in the last 168 hours BNP (last 3 results) No results found for this basename: PROBNP,  in the last 8760 hours CBG: No results found for this basename: GLUCAP,  in the last 168 hours  Recent Results (from the past 240 hour(s))  CULTURE, BLOOD (ROUTINE X 2)     Status: None   Collection Time    08/10/12 11:40 PM      Result Value Range Status   Specimen Description BLOOD RIGHT ANTECUBITAL   Final   Special Requests BOTTLES DRAWN AEROBIC AND ANAEROBIC 2.5CC   Final   Culture  Setup Time 08/11/2012 03:55   Final   Culture     Final   Value:        BLOOD CULTURE RECEIVED NO GROWTH TO DATE CULTURE WILL BE HELD FOR 5 DAYS BEFORE ISSUING A FINAL NEGATIVE REPORT   Report Status PENDING   Incomplete  CULTURE, BLOOD (ROUTINE X 2)     Status: None   Collection Time    08/10/12 11:45 PM      Result Value Range Status   Specimen Description BLOOD LEFT ANTECUBITAL   Final   Special Requests BOTTLES DRAWN AEROBIC AND ANAEROBIC 5CC   Final   Culture  Setup Time 08/11/2012 03:42   Final   Culture     Final   Value:        BLOOD CULTURE RECEIVED NO GROWTH TO DATE CULTURE WILL BE HELD FOR 5 DAYS BEFORE ISSUING A FINAL NEGATIVE REPORT   Report Status PENDING   Incomplete     Studies: No results found.  Scheduled Meds: . aztreonam  1 g Intravenous Q8H  . calcium carbonate  500 mg of elemental calcium Oral BID WC  . cyproheptadine  4 mg Oral Once  . docusate sodium  100 mg Oral BID  . gabapentin  200 mg Oral TID  . OxyCODONE  20 mg Oral QHS  . sodium chloride  3 mL Intravenous Q12H  . vancomycin  1,250 mg Intravenous Q24H   Continuous Infusions:    Active Problems:   Cellulitis of left lower extremity   Acute kidney injury   Non-small cell carcinoma of lung   Pancytopenia    Time spent: 25 minutes.    REGALADO,BELKYS  [facility] Pager [phone]. If 8PM-8AM, please contact  night-coverage at [email or web address], password [facility] 08/14/2012, 8:34 AM  LOS: 4 days   Encephalopathy/ delirium secondary to infection. hospital delirium, vs medication induce, benadryl. Patient has been taking  Xanax for long time.

## 2012-08-15 DIAGNOSIS — E871 Hypo-osmolality and hyponatremia: Secondary | ICD-10-CM | POA: Diagnosis present

## 2012-08-15 LAB — COMPREHENSIVE METABOLIC PANEL
Albumin: 1.8 g/dL — ABNORMAL LOW (ref 3.5–5.2)
Alkaline Phosphatase: 97 U/L (ref 39–117)
BUN: 16 mg/dL (ref 6–23)
CO2: 22 mEq/L (ref 19–32)
Chloride: 104 mEq/L (ref 96–112)
GFR calc Af Amer: 59 mL/min — ABNORMAL LOW (ref >90)
GFR calc non Af Amer: 51 mL/min — ABNORMAL LOW (ref >90)
Glucose, Bld: 84 mg/dL (ref 70–99)
Potassium: 3.4 mEq/L — ABNORMAL LOW (ref 3.5–5.1)
Total Bilirubin: 0.4 mg/dL (ref 0.3–1.2)

## 2012-08-15 LAB — CBC WITH DIFFERENTIAL/PLATELET
Basophils Relative: 0 % (ref 0–1)
Eosinophils Relative: 3 % (ref 0–5)
HCT: 22.4 % — ABNORMAL LOW (ref 36.0–46.0)
Hemoglobin: 7.9 g/dL — ABNORMAL LOW (ref 12.0–15.0)
Lymphocytes Relative: 10 % — ABNORMAL LOW (ref 12–46)
Lymphs Abs: 0.5 10*3/uL — ABNORMAL LOW (ref 0.7–4.0)
MCV: 97.8 fL (ref 78.0–100.0)
Monocytes Relative: 8 % (ref 3–12)
Neutro Abs: 4.1 10*3/uL (ref 1.7–7.7)
WBC: 5.2 10*3/uL (ref 4.0–10.5)

## 2012-08-15 MED ORDER — SENNOSIDES-DOCUSATE SODIUM 8.6-50 MG PO TABS
1.0000 | ORAL_TABLET | Freq: Every day | ORAL | Status: DC
  Administered 2012-08-15 – 2012-08-17 (×3): 1 via ORAL
  Filled 2012-08-15 (×4): qty 1

## 2012-08-15 MED ORDER — SODIUM CHLORIDE 0.9 % IJ SOLN
INTRAMUSCULAR | Status: AC
  Filled 2012-08-15: qty 12

## 2012-08-15 MED ORDER — HYDROXYZINE HCL 10 MG PO TABS
10.0000 mg | ORAL_TABLET | Freq: Four times a day (QID) | ORAL | Status: DC | PRN
  Filled 2012-08-15: qty 1

## 2012-08-15 MED ORDER — POLYETHYLENE GLYCOL 3350 17 G PO PACK
17.0000 g | PACK | Freq: Two times a day (BID) | ORAL | Status: DC
  Administered 2012-08-15 – 2012-08-18 (×4): 17 g via ORAL
  Filled 2012-08-15 (×7): qty 1

## 2012-08-15 MED ORDER — POLYETHYLENE GLYCOL 3350 17 G PO PACK
17.0000 g | PACK | Freq: Every day | ORAL | Status: DC
  Administered 2012-08-15: 17 g via ORAL
  Filled 2012-08-15: qty 1

## 2012-08-15 MED ORDER — POTASSIUM CHLORIDE CRYS ER 20 MEQ PO TBCR
40.0000 meq | EXTENDED_RELEASE_TABLET | Freq: Once | ORAL | Status: AC
  Administered 2012-08-15: 40 meq via ORAL
  Filled 2012-08-15: qty 2

## 2012-08-15 NOTE — Progress Notes (Addendum)
    [facility]    Date of Admission:  08/10/2012   Total days of antibiotics 5        Day 5 vanco        Day 5 aztreo           ID: Jamie Munoz is a 79 y.o. female with metastatic non small cell lung ca presents with pancytopenia and rash Active Problems:   Cellulitis of left lower extremity   Acute kidney injury   Non-small cell carcinoma of lung   Pancytopenia    Subjective:afebrile  Medications:  . calcium carbonate  500 mg of elemental calcium Oral BID WC  . cyproheptadine  4 mg Oral Once  . gabapentin  200 mg Oral TID  . OxyCODONE  20 mg Oral QHS  . sodium chloride  3 mL Intravenous Q12H  . sodium chloride        Objective: Vital signs in last 24 hours: Temp:  [98.2 F (36.8 C)-99 F (37.2 C)] 98.2 F (36.8 C) (02/26 0552) Pulse Rate:  [79-84] 83 (02/26 0552) Resp:  [16-18] 16 (02/26 0552) BP: (113-123)/(47-63) 113/63 mmHg (02/26 0552) SpO2:  [96 %-98 %] 97 % (02/26 0552) gen = a xo by 3 Skin = unchanged over night, still has palpable purpuric erythamatous lesions with 2 small areas of central necrosis on right lower extremity. Left lower extremity diffusely erythamatous, +2 edema, non-blanching Lab Results  Recent Labs  08/14/12 0410 08/15/12 0340  WBC 7.8 5.2  HGB 8.3* 7.9*  HCT 23.6* 22.4*  NA 136 134*  K 3.6 3.4*  CL 107 104  CO2 21 22  BUN 16 16  CREATININE 1.01 1.00   Liver Panel  Recent Labs  08/15/12 0340  PROT 4.9*  ALBUMIN 1.8*  AST 31  ALT 18  ALKPHOS 97  BILITOT 0.4    Microbiology: Blood cx NGTD Studies/Results: No results found.   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 No need for any biopsy, at this time.   we will order RF, ANA, HCV, sed rate, and CRP  Jamie Munoz, [facility] Cell: [phone] Pager: [phone]  08/15/2012, 9:44  AM

## 2012-08-15 NOTE — Progress Notes (Signed)
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 Patient is currently afebrile. White count has normalized after Neupogen. MRI negative for abscess or fasciitis. Antibiotics have been discontinued. Monitor off antibiotics. ID to discuss with dermatology on started patient on steroids. ID is following.  #2 pancytopenia Likely secondary to recent chemotherapy. Status post Neupogen. White count has improved. Hemoglobin is at 7.9. Platelet count is at 26. No signs of bleeding. Follow. Per oncology.  #3 hypokalemia Replete.  #4 metastatic non-small cell lung cancer diagnosed March of 2006 status post multiple treatments and currently on maintenance chemotherapy with single agent Alimta. Chemotherapy on hold per oncology recommendations. Per oncology.  #5 hyponatremia Resolved.  #6 acute renal insufficiency Resolved.  #7 rash on back May be a contact dermatitis. Rash is blanchable. Will asked nursing to change she does. Hydroxyzine as needed. Continue Benadryl lotion.  #8 redness around Port-A-Cath Patient has been seen by interventional radiology and recommend monitoring for now.  #9 ?? Hypocalcemia Corrected calcium is 8.6. On calcium supplementation. Follow.    Code Status: Full Family Communication:  Admitted patient and family at bedside. Disposition Plan: Home when medically stable.   Consultants:  Infectious disease: Dr. Drue Second 08/14/2012  Hematology oncology: Dr. Myna Hidalgo 08/11/2012  Procedures:  MRI of the left tibia-fibula 08/11/2012  Antibiotics:  IV vancomycin 08/10/2012--> 08/14/12  IV Aztreonam 08/10/12----> 08/14/12  Status post 2 units of packed red blood cells.  HPI/Subjective: Patient complaining of  right lower extremity pain. Patient also complaining of rash on her back which she states it's itching. Patient denies any chest pain. No shortness of breath.  Objective: Filed Vitals:   08/14/12 1357 08/14/12 2109 08/15/12 0552 08/15/12 1329  BP: 123/47 118/48 113/63 131/54  Pulse: 79 84 83 76  Temp: 98.4 F (36.9 C) 99 F (37.2 C) 98.2 F (36.8 C) 98.5 F (36.9 C)  TempSrc: Oral Oral Oral Oral  Resp: 16 18 16 16   Height:      Weight:      SpO2: 96% 98% 97% 97%    Intake/Output Summary (Last 24 hours) at 08/15/12 1437 Last data filed at 08/15/12 1329  Gross per 24 hour  Intake    340 ml  Output   2400 ml  Net  -2060 ml   Filed Weights   08/10/12 1654 08/10/12 1726  Weight: 63.7 kg (140 lb 6.9 oz) 62.9 kg (138 lb 10.7 oz)    Exam:   General:  NAD  Cardiovascular: RRR  Respiratory: CTAB  Abdomen: Soft, nontender, nondistended, positive bowel sounds.  Extremities: Right lower extremity with erythema, warmth, tenderness to palpation. Left lower extremity with some erythema which looks like chronic venous stasis changes  left of foot erythematous with some tenderness to palpation.  Skin: Back with a blanchable rash.  Data Reviewed: Basic Metabolic Panel:  Recent Labs Lab 08/10/12 1322 08/11/12 0600 08/13/12 0600 08/14/12 0410 08/15/12 0340  NA 134* 131* 137 136 134*  K 3.9 3.6 3.6 3.6 3.4*  CL 98 101 107 107 104  CO2 24 22 21 21 22   GLUCOSE 103* 74 83 89 84  BUN 28* 23 13 16 16   CREATININE 1.29* 1.10 1.09 1.01 1.00  CALCIUM 7.9* 7.2* 6.5* 6.3* 6.8*   Liver Function Tests:  Recent Labs Lab 08/10/12 1322 08/15/12 0340  AST 30  31  ALT 15 18  ALKPHOS 78 97  BILITOT 0.6 0.4  PROT 6.3 4.9*  ALBUMIN 2.7* 1.8*   No results found for this basename: LIPASE, AMYLASE,  in the last 168 hours No results found for this basename: AMMONIA,  in the last 168 hours CBC:  Recent Labs Lab 08/11/12 0600 08/12/12 0653 08/13/12 0600 08/14/12 0410  08/15/12 0340  WBC 1.3* 4.4 8.4 7.8 5.2  NEUTROABS 0.8* 3.8 7.4 6.6 4.1  HGB 7.8* 8.3* 8.6* 8.3* 7.9*  HCT 22.0* 23.9* 24.6* 23.6* 22.4*  MCV 96.1 97.6 98.4 98.3 97.8  PLT 53* 44* 35* 30* 26*   Cardiac Enzymes: No results found for this basename: CKTOTAL, CKMB, CKMBINDEX, TROPONINI,  in the last 168 hours BNP (last 3 results) No results found for this basename: PROBNP,  in the last 8760 hours CBG: No results found for this basename: GLUCAP,  in the last 168 hours  Recent Results (from the past 240 hour(s))  CULTURE, BLOOD (ROUTINE X 2)     Status: None   Collection Time    08/10/12 11:40 PM      Result Value Range Status   Specimen Description BLOOD RIGHT ANTECUBITAL   Final   Special Requests BOTTLES DRAWN AEROBIC AND ANAEROBIC 2.5CC   Final   Culture  Setup Time 08/11/2012 03:55   Final   Culture     Final   Value:        BLOOD CULTURE RECEIVED NO GROWTH TO DATE CULTURE WILL BE HELD FOR 5 DAYS BEFORE ISSUING A FINAL NEGATIVE REPORT   Report Status PENDING   Incomplete  CULTURE, BLOOD (ROUTINE X 2)     Status: None   Collection Time    08/10/12 11:45 PM      Result Value Range Status   Specimen Description BLOOD LEFT ANTECUBITAL   Final   Special Requests BOTTLES DRAWN AEROBIC AND ANAEROBIC 5CC   Final   Culture  Setup Time 08/11/2012 03:42   Final   Culture     Final   Value:        BLOOD CULTURE RECEIVED NO GROWTH TO DATE CULTURE WILL BE HELD FOR 5 DAYS BEFORE ISSUING A FINAL NEGATIVE REPORT   Report Status PENDING   Incomplete     Studies: No results found.  Scheduled Meds: . calcium carbonate  500 mg of elemental calcium Oral BID WC  . cyproheptadine  4 mg Oral Once  . gabapentin  200 mg Oral TID  . OxyCODONE  20 mg Oral QHS  . polyethylene glycol  17 g Oral Daily  . senna-docusate  1 tablet Oral QHS  . sodium chloride  3 mL Intravenous Q12H  . sodium chloride       Continuous Infusions:   Active Problems:   Cellulitis of left lower extremity   Acute  kidney injury   Non-small cell carcinoma of lung   Pancytopenia    Time spent: > 35 mins    [facility]  [facility] Pager [phone]. If 8PM-8AM, please contact night-coverage at [email or web address], password [facility] 08/15/2012, 2:37 PM  LOS: 5 days

## 2012-08-16 ENCOUNTER — Inpatient Hospital Stay (HOSPITAL_COMMUNITY): Payer: Medicare Other

## 2012-08-16 DIAGNOSIS — M310 Hypersensitivity angiitis: Principal | ICD-10-CM

## 2012-08-16 LAB — CBC WITH DIFFERENTIAL/PLATELET
Basophils Absolute: 0 10*3/uL (ref 0.0–0.1)
Eosinophils Absolute: 0.2 10*3/uL (ref 0.0–0.7)
Lymphs Abs: 0.6 10*3/uL — ABNORMAL LOW (ref 0.7–4.0)
MCH: 34.5 pg — ABNORMAL HIGH (ref 26.0–34.0)
MCHC: 35.1 g/dL (ref 30.0–36.0)
MCV: 98.3 fL (ref 78.0–100.0)
Monocytes Absolute: 0.5 10*3/uL (ref 0.1–1.0)
Platelets: 30 10*3/uL — ABNORMAL LOW (ref 150–400)
RDW: 16.5 % — ABNORMAL HIGH (ref 11.5–15.5)
WBC: 5.8 10*3/uL (ref 4.0–10.5)

## 2012-08-16 LAB — BASIC METABOLIC PANEL
BUN: 17 mg/dL (ref 6–23)
Calcium: 7.2 mg/dL — ABNORMAL LOW (ref 8.4–10.5)
Creatinine, Ser: 1.09 mg/dL (ref 0.50–1.10)
GFR calc Af Amer: 53 mL/min — ABNORMAL LOW (ref >90)

## 2012-08-16 NOTE — Progress Notes (Signed)
[facility] PROGRESS NOTE  Jamie Munoz WGN:[id number] DOB: 1927-04-29 DOA: 08/10/2012 PCP: [name], MD  Assessment/Plan:  #1 bilateral lower extremity leukocytoclastic vasculitis Patient symptoms per ID consistent with a leukocytoclastic vasculitis. Patient is currently afebrile. White count has normalized after Neupogen. MRI negative for abscess or fasciitis. Antibiotics have been discontinued. Monitor off antibiotics. ID to discuss with dermatology on starting patient on steroids. ID is following. Will need outpatient dermatology follow up. Will xray left foot as patient unable to bear weight.  #2 pancytopenia Likely secondary to recent chemotherapy. Status post Neupogen. White count has improved. Hemoglobin is at 8.1. Platelet count is at 30. No signs of bleeding. Follow. Per oncology.  #3 hypokalemia Repleted.  #4 metastatic non-small cell lung cancer diagnosed March of 2006 status post multiple treatments and currently on maintenance chemotherapy with single agent Alimta. Chemotherapy on hold per oncology recommendations. Per oncology.  #5 hyponatremia Resolved.  #6 acute renal insufficiency Resolved.  #7 rash on back May be a contact dermatitis. Rash is blanchable. Will asked nursing to change bed sheets. Hydroxyzine as needed. Continue Benadryl lotion.  #8 redness around Port-A-Cath Patient has been seen by interventional radiology and recommend monitoring for now.  #9 ?? Hypocalcemia Corrected calcium is 8.6. On calcium supplementation. Follow.    Code Status: Full Family Communication:  Admitted patient and family at bedside. Disposition Plan: Home when medically stable.   Consultants:  Infectious disease: Dr. Drue Second 08/14/2012  Hematology oncology: Dr. Myna Hidalgo 08/11/2012  Procedures:  MRI of the left tibia-fibula 08/11/2012  Antibiotics:  IV vancomycin 08/10/2012--> 08/14/12  IV Aztreonam 08/10/12----> 08/14/12  Status post 2 units of  packed red blood cells.  HPI/Subjective: Patient complaining of right lower extremity pain > L and unable to bear weight on R foot. Patient also complaining of rash on her back which she states itching improved. Patient denies any chest pain. No shortness of breath.  Objective: Filed Vitals:   08/15/12 1329 08/15/12 2114 08/16/12 0439 08/16/12 1341  BP: 131/54 129/51 140/51 132/49  Pulse: 76 73 75 82  Temp: 98.5 F (36.9 C) 98.7 F (37.1 C) 98.3 F (36.8 C) 98.7 F (37.1 C)  TempSrc: Oral Oral Oral Oral  Resp: 16 16 16 16   Height:      Weight:      SpO2: 97% 99% 96% 95%    Intake/Output Summary (Last 24 hours) at 08/16/12 1711 Last data filed at 08/16/12 1342  Gross per 24 hour  Intake   1159 ml  Output   1201 ml  Net    -42 ml   Filed Weights   08/10/12 1654 08/10/12 1726  Weight: 63.7 kg (140 lb 6.9 oz) 62.9 kg (138 lb 10.7 oz)    Exam:   General:  NAD  Cardiovascular: RRR  Respiratory: CTAB  Abdomen: Soft, nontender, nondistended, positive bowel sounds.  Extremities: Right lower extremity with erythema, warmth, tenderness to palpation. Left lower extremity with some erythema which looks like chronic venous stasis changes  left of foot erythematous with some tenderness to palpation.  Skin: Back with a blanchable rash.  Data Reviewed: Basic Metabolic Panel:  Recent Labs Lab 08/11/12 0600 08/13/12 0600 08/14/12 0410 08/15/12 0340 08/16/12 0545  NA 131* 137 136 134* 139  K 3.6 3.6 3.6 3.4* 4.1  CL 101 107 107 104 108  CO2 22 21 21 22 24   GLUCOSE 74 83 89 84 86  BUN 23 13 16 16 17   CREATININE 1.10 1.09 1.01 1.00 1.09  CALCIUM 7.2* 6.5* 6.3* 6.8* 7.2*   Liver Function Tests:  Recent Labs Lab 08/10/12 1322 08/15/12 0340  AST 30 31  ALT 15 18  ALKPHOS 78 97  BILITOT 0.6 0.4  PROT 6.3 4.9*  ALBUMIN 2.7* 1.8*   No results found for this basename: LIPASE, AMYLASE,  in the last 168 hours No results found for this basename: AMMONIA,  in the last  168 hours CBC:  Recent Labs Lab 08/12/12 0653 08/13/12 0600 08/14/12 0410 08/15/12 0340 08/16/12 0545  WBC 4.4 8.4 7.8 5.2 5.8  NEUTROABS 3.8 7.4 6.6 4.1 4.5  HGB 8.3* 8.6* 8.3* 7.9* 8.1*  HCT 23.9* 24.6* 23.6* 22.4* 23.1*  MCV 97.6 98.4 98.3 97.8 98.3  PLT 44* 35* 30* 26* 30*   Cardiac Enzymes: No results found for this basename: CKTOTAL, CKMB, CKMBINDEX, TROPONINI,  in the last 168 hours BNP (last 3 results) No results found for this basename: PROBNP,  in the last 8760 hours CBG: No results found for this basename: GLUCAP,  in the last 168 hours  Recent Results (from the past 240 hour(s))  CULTURE, BLOOD (ROUTINE X 2)     Status: None   Collection Time    08/10/12 11:40 PM      Result Value Range Status   Specimen Description BLOOD RIGHT ANTECUBITAL   Final   Special Requests BOTTLES DRAWN AEROBIC AND ANAEROBIC 2.5CC   Final   Culture  Setup Time 08/11/2012 03:55   Final   Culture     Final   Value:        BLOOD CULTURE RECEIVED NO GROWTH TO DATE CULTURE WILL BE HELD FOR 5 DAYS BEFORE ISSUING A FINAL NEGATIVE REPORT   Report Status PENDING   Incomplete  CULTURE, BLOOD (ROUTINE X 2)     Status: None   Collection Time    08/10/12 11:45 PM      Result Value Range Status   Specimen Description BLOOD LEFT ANTECUBITAL   Final   Special Requests BOTTLES DRAWN AEROBIC AND ANAEROBIC 5CC   Final   Culture  Setup Time 08/11/2012 03:42   Final   Culture     Final   Value:        BLOOD CULTURE RECEIVED NO GROWTH TO DATE CULTURE WILL BE HELD FOR 5 DAYS BEFORE ISSUING A FINAL NEGATIVE REPORT   Report Status PENDING   Incomplete     Studies: No results found.  Scheduled Meds: . calcium carbonate  500 mg of elemental calcium Oral BID WC  . cyproheptadine  4 mg Oral Once  . gabapentin  200 mg Oral TID  . OxyCODONE  20 mg Oral QHS  . polyethylene glycol  17 g Oral BID  . senna-docusate  1 tablet Oral QHS  . sodium chloride  3 mL Intravenous Q12H   Continuous Infusions:    Principal Problem:   Leukocytoclastic vasculitis Active Problems:   Lung cancer   Hypokalemia   Acute kidney injury   Non-small cell carcinoma of lung   Pancytopenia   Hyponatremia    Time spent: > 35 mins    [facility]  [facility] Pager [phone]. If 8PM-8AM, please contact night-coverage at [email or web address], password [facility] 08/16/2012, 5:11 PM  LOS: 6 days

## 2012-08-16 NOTE — Evaluation (Signed)
Occupational Therapy Evaluation Patient Details Name: Jamie Munoz MRN: [id number] DOB: 1926/11/04 Today's Date: 08/16/2012 Time: 4098-1191 OT Time Calculation (min): 41 min  OT Assessment / Plan / Recommendation Clinical Impression  79 yo admitted LLE pain.erythema,and edema with pancytopenia who is having incrasing edema, pain and erythema in RLE.  Pt is deconditioned from immobility and unable to bear weight throught LLE due to pain. Will follow for I with ADL activity and functional mobility while in the hospital and she will hopefully d/c to home with [facility]    OT Assessment  Patient needs continued OT Services    Follow Up Recommendations  Home health OT             Frequency  Min 3X/week    Precautions / Restrictions Precautions Precaution Comments: pain, edema and erythema in both legs Restrictions Weight Bearing Restrictions: No       ADL  Grooming: Simulated;Set up Where Assessed - Grooming: Unsupported sitting Upper Body Bathing: Simulated;Set up Where Assessed - Upper Body Bathing: Unsupported sitting Lower Body Bathing: Simulated;Minimal assistance Where Assessed - Lower Body Bathing: Supported sit to stand Upper Body Dressing: Simulated;Set up Where Assessed - Upper Body Dressing: Supported sit to stand Lower Body Dressing: Minimal assistance;Performed Where Assessed - Lower Body Dressing: Supported sit to Pharmacist, hospital: Simulated;Other (comment);Minimal assistance (bed to chair only) Toilet Transfer Method: Sit to stand Toileting - Clothing Manipulation and Hygiene: Simulated;Minimal assistance Where Assessed - Toileting Clothing Manipulation and Hygiene: Sit to stand from 3-in-1 or toilet    OT Diagnosis: Generalized weakness  OT Problem List: Decreased strength;Decreased activity tolerance;Decreased knowledge of use of DME or AE   OT Goals Acute Rehab OT Goals OT Goal Formulation: With patient Time For Goal Achievement: 08/30/12 ADL Goals Pt  Will Perform Grooming: with supervision;Standing at sink ADL Goal: Grooming - Progress: Goal set today Pt Will Perform Lower Body Dressing: with supervision;Sit to stand from bed ADL Goal: Lower Body Dressing - Progress: Goal set today Pt Will Transfer to Toilet: with supervision;Comfort height toilet ADL Goal: Toilet Transfer - Progress: Goal set today Pt Will Perform Toileting - Clothing Manipulation: with supervision;Standing ADL Goal: Toileting - Clothing Manipulation - Progress: Goal set today  Visit Information  Last OT Received On: 08/16/12    Subjective Data  Subjective: I am willing to get up   Prior Functioning     Home Living Lives With: Alone Available Help at Discharge: Family;Available PRN/intermittently Type of Home: House Home Access: Stairs to enter [employer] of Steps: 1 Entrance Stairs-Rails: Right Home Layout: One level Home Adaptive Equipment: Shower chair without back Prior Function Level of Independence: Independent with assistive device(s) (pt reports she used a crutch) Able to Take Stairs?: Yes Communication Communication: No difficulties            Cognition  Cognition Overall Cognitive Status: Appears within functional limits for tasks assessed/performed Arousal/Alertness: Awake/alert Orientation Level: Appears intact for tasks assessed Behavior During Session: [facility] for tasks performed    Extremity/Trunk Assessment Right Upper Extremity Assessment RUE ROM/Strength/Tone: [facility] for tasks assessed Left Upper Extremity Assessment LUE ROM/Strength/Tone: WFL for tasks assessed Right Lower Extremity Assessment RLE ROM/Strength/Tone: Deficits RLE ROM/Strength/Tone Deficits: pt with active movement in all groups, but has some tightness in heelcords. Able to achieve dorsiflexion to neutral after doing ankle ROM ex.  Pitting edema present. dark red blotchy erythema on lower leg that appears to be increasing beyond ink border marked  yesterday RLE Sensation: WFL - Light  Touch;WFL - Proprioception Left Lower Extremity Assessment LLE ROM/Strength/Tone: Deficits LLE ROM/Strength/Tone Deficits: dark red blanchable  circumferential erythema with brawny changes to skin apparent. Pt also is devleoping dryness and scaling of skin in places.  Pt is able to do AROM to ankle, but lacks dorsiflexion to neutral even after stretching LLE Sensation: WFL - Light Touch;WFL - Proprioception Trunk Assessment Trunk Assessment: Normal     Mobility Bed Mobility Bed Mobility: Rolling Right;Rolling Left;Supine to Sit;Sit to Supine Rolling Right: 6: Modified independent (Device/Increase time);With rail Rolling Left: 6: Modified independent (Device/Increase time);With rail Supine to Sit: 6: Modified independent (Device/Increase time);HOB flat Sit to Supine: 4: Min assist Details for Bed Mobility Assistance: min assist to lift legs up onto bed as they were painful from dependent position and standing attemp Transfers Sit to Stand: 3: Mod assist;With upper extremity assist;From bed;From elevated surface Stand to Sit: 4: Min assist;With upper extremity assist;To chair/3-in-1 Details for Transfer Assistance: Pt attempted to stand without pushing up with arms and was unable.  She needed to scoot to a position that she could push up with bedrail and then was able to stand with cues to activate glutes     Exercise General Exercises - Upper Extremity Shoulder Flexion: AROM;Both;15 reps;Sitting in chair with yellow theraband.    Balance Balance Balance Assessed: Yes Static Sitting Balance Static Sitting - Balance Support: No upper extremity supported;Feet supported Static Sitting - Level of Assistance: 7: Independent Static Sitting - Comment/# of Minutes: worked on heel cord stretching while sitting on EOB with weight bearing on feet Static Standing Balance Static Standing - Balance Support: Bilateral upper extremity supported Static Standing -  Comment/# of Minutes: for 10 sec with NWB on LLE   End of Session  Pt left in chair with call bell with in reach. Pts family present.  GO     Alba Cory 08/16/2012, 11:33 AM

## 2012-08-16 NOTE — Evaluation (Signed)
Physical Therapy Evaluation Patient Details Name: Jamie Munoz MRN: [id number] DOB: Sep 08, 1926 Today's Date: 08/16/2012 Time: 4098-1191 PT Time Calculation (min): 36 min  PT Assessment / Plan / Recommendation Clinical Impression  79 yo admitted LLE pain.erythema,and edema with pancytopenia who is having incrasing edema, pain and erythema in RLE.  Pt is deconditioned from immobility and unable to bear weight throught LLE due to pain. Will follow for strengthening and functional mobility while in the hospital and she will hopefully d/c to home with HHPT.  She may benefit from a WOC consult  for suggestions for skin cream (eucerine type?)  or other skin care. Please order if you agree.  Thanks!    PT Assessment       Follow Up Recommendations  Home health PT    Does the patient have the potential to tolerate intense rehabilitation      Barriers to Discharge        Equipment Recommendations  Rolling walker with 5" wheels;Other (comment) (may need knee weight bearing attachment for walker)    Recommendations for Other Services  (WOC nurse)   Frequency      Precautions / Restrictions Precautions Precaution Comments: pain, edema and erythema in both legs Restrictions Weight Bearing Restrictions: No   Pertinent Vitals/Pain Pt with increasing pain with dependent position of legs and weight bearing on RLE      Mobility  Bed Mobility Bed Mobility: Rolling Right;Rolling Left;Supine to Sit;Sit to Supine Rolling Right: 6: Modified independent (Device/Increase time);With rail Rolling Left: 6: Modified independent (Device/Increase time);With rail Supine to Sit: 6: Modified independent (Device/Increase time);HOB flat Sit to Supine: 4: Min assist Details for Bed Mobility Assistance: min assist to lift legs up onto bed as they were painful from dependent position and standing attemp Transfers Transfers: Sit to Stand;Stand to Sit Sit to Stand: 3: Mod assist;With upper extremity  assist;From bed;From elevated surface Stand to Sit: 4: Min assist;With upper extremity assist;To bed Details for Transfer Assistance: Pt attempted to stand without pushing up with arms and was unable.  She needed to scoot to a position that she could push up with bedrail and then was able to stand with cues to activate glutes Ambulation/Gait Ambulation/Gait Assistance: Not tested (comment) Assistive device: Rolling walker Stairs: No Wheelchair Mobility Wheelchair Mobility: No    Exercises General Exercises - Upper Extremity Shoulder Flexion: AROM;Both;15 reps;Supine General Exercises - Lower Extremity Ankle Circles/Pumps: AAROM;AROM;Both;Limitations;Supine;Seated;Sidelying Ankle Circles/Pumps Limitations: heelcord tightness.  worked on ROM in multiple positions including stretcing against padded footboard and weight bearing with knee flexion in sitting Quad Sets: AROM;Both;10 reps;Supine Gluteal Sets: AROM;Both;10 reps;Supine;Standing;Sidelying Heel Slides: AROM;Both;10 reps;Seated Hip ABduction/ADduction: AROM;Both;5 reps;AAROM;Sidelying Hip Flexion/Marching: AROM;Both;10 reps;Supine Other Exercises Other Exercises: standing balance with RW NWB on LLE with cues for core activation Other Exercises: abominal and deep breathing Other Exercises: abd sets   PT Diagnosis:    PT Problem List:   PT Treatment Interventions:     PT Goals Acute Rehab PT Goals PT Goal Formulation: With patient Time For Goal Achievement: 08/30/12 Potential to Achieve Goals: Good Pt will go Sit to Stand: with modified independence PT Goal: Sit to Stand - Progress: Goal set today Pt will go Stand to Sit: with modified independence PT Goal: Stand to Sit - Progress: Goal set today Pt will Ambulate: 1 - 15 feet;with modified independence;with least restrictive assistive device PT Goal: Ambulate - Progress: Goal set today Pt will Perform Home Exercise Program: Independently PT Goal: Perform Home Exercise  Program -  Progress: Goal set today  Visit Information  Last PT Received On: 08/16/12    Subjective Data  Subjective: "It getting worse" Patient Stated Goal: To get legs healed up before she goes home   Prior Functioning  Home Living Lives With: Alone Available Help at Discharge: Family;Available PRN/intermittently Type of Home: House Home Access: Stairs to enter [employer] of Steps: 1 Entrance Stairs-Rails: Right Home Layout: One level Home Adaptive Equipment: Crutches Prior Function Level of Independence: Independent with assistive device(s) (pt reports she used a crutch) Able to Take Stairs?: Yes Communication Communication: No difficulties    Cognition  Cognition Overall Cognitive Status: Appears within functional limits for tasks assessed/performed Arousal/Alertness: Awake/alert Orientation Level: Appears intact for tasks assessed Behavior During Session: [facility] for tasks performed    Extremity/Trunk Assessment Right Lower Extremity Assessment RLE ROM/Strength/Tone: Deficits RLE ROM/Strength/Tone Deficits: pt with active movement in all groups, but has some tightness in heelcords. Able to achieve dorsiflexion to neutral after doing ankle ROM ex.  Pitting edema present. dark red blotchy erythema on lower leg that appears to be increasing beyond ink border marked yesterday RLE Sensation: WFL - Light Touch;WFL - Proprioception Left Lower Extremity Assessment LLE ROM/Strength/Tone: Deficits LLE ROM/Strength/Tone Deficits: dark red blanchable  circumferential erythema with brawny changes to skin apparent. Pt also is devleoping dryness and scaling of skin in places.  Pt is able to do AROM to ankle, but lacks dorsiflexion to neutral even after stretching LLE Sensation: WFL - Light Touch;WFL - Proprioception Trunk Assessment Trunk Assessment: Normal   Balance Balance Balance Assessed: Yes Static Sitting Balance Static Sitting - Balance Support: No upper extremity  supported;Feet supported Static Sitting - Level of Assistance: 7: Independent Static Sitting - Comment/# of Minutes: worked on heel cord stretching while sitting on EOB with weight bearing on feet Static Standing Balance Static Standing - Balance Support: Bilateral upper extremity supported Static Standing - Comment/# of Minutes: for 10 sec with NWB on LLE  End of Session PT - End of Session Activity Tolerance: Patient limited by pain Patient left: in bed;with family/visitor present Nurse Communication: Mobility status  GP   Rosey Bath K. [place], [place] [phone]  08/16/2012, 11:15 AM

## 2012-08-16 NOTE — Care Management (Signed)
CM spoke with patient  With sister at the bedside concerning discharge planning. Pt provided with choice list for Jamie Munoz. PT eval recommends HHPT/OT. No choice made at this time. Will continue to follow.  Roxy Manns Caryn Gienger,RN,BSN [phone]

## 2012-08-16 NOTE — Progress Notes (Addendum)
    [facility]    Date of Admission:  08/10/2012   Total days of antibiotics 5( not currently on abtx)          ID: Jamie Munoz is a 79 y.o. female with metastatic nonsmall cell lung ca presents with ongoing rash to lower extremities c/w leukocytoclastic vasculitis  Principal Problem:   Cellulitis of left lower extremity Active Problems:   Lung cancer   Hypokalemia   Acute kidney injury   Non-small cell carcinoma of lung   Pancytopenia   Hyponatremia    Subjective: afebrile  Medications:  . calcium carbonate  500 mg of elemental calcium Oral BID WC  . cyproheptadine  4 mg Oral Once  . gabapentin  200 mg Oral TID  . OxyCODONE  20 mg Oral QHS  . polyethylene glycol  17 g Oral BID  . senna-docusate  1 tablet Oral QHS  . sodium chloride  3 mL Intravenous Q12H    Objective: Vital signs in last 24 hours: Temp:  [98.3 F (36.8 C)-98.7 F (37.1 C)] 98.3 F (36.8 C) (02/27 0439) Pulse Rate:  [73-76] 75 (02/27 0439) Resp:  [16] 16 (02/27 0439) BP: (129-140)/(51-54) 140/51 mmHg (02/27 0439) SpO2:  [96 %-99 %] 96 % (02/27 0439) gen = a xo by 3  Skin = unchanged over night, still has palpable purpuric erythamatous lesions with 2 small areas of central necrosis on right lower extremity. Left lower extremity diffusely erythamatous, +2 edema, non-blanching   Lab Results  Recent Labs  08/15/12 0340 08/16/12 0545  WBC 5.2 5.8  HGB 7.9* 8.1*  HCT 22.4* 23.1*  NA 134* 139  K 3.4* 4.1  CL 104 108  CO2 22 24  BUN 16 17  CREATININE 1.00 1.09   Liver Panel  Recent Labs  08/15/12 0340  PROT 4.9*  ALBUMIN 1.8*  AST 31  ALT 18  ALKPHOS 97  BILITOT 0.4   Sedimentation Rate No results found for this basename: ESRSEDRATE,  in the last 72 hours C-Reactive Protein No results found for this basename: CRP,  in the last 72 hours  Microbiology: 2/21 blood cx NGTD Studies/Results: No results found.   Assessment/Plan: Leukocytoclastic  vasculitis = clinically appears to be c/w LV. No need for antibiotics. Recommend outpatient follow up with dermatology to decide if further work up is needed and can decide if systemic steroids is needed. Per this presentation, I would not give steroids at this time. Limited healed area of necrosis < 1 cm. On anterior shin of right leg.  Drue Second [facility] Cell: [phone] Pager: [phone]  08/16/2012, 12:50 PM

## 2012-08-17 ENCOUNTER — Telehealth: Payer: Self-pay | Admitting: *Deleted

## 2012-08-17 DIAGNOSIS — M310 Hypersensitivity angiitis: Secondary | ICD-10-CM

## 2012-08-17 LAB — URINALYSIS, ROUTINE W REFLEX MICROSCOPIC
Glucose, UA: NEGATIVE mg/dL
Ketones, ur: NEGATIVE mg/dL
Leukocytes, UA: NEGATIVE
Nitrite: NEGATIVE
Specific Gravity, Urine: 1.012 (ref 1.005–1.030)
pH: 7 (ref 5.0–8.0)

## 2012-08-17 LAB — CULTURE, BLOOD (ROUTINE X 2): Culture: NO GROWTH

## 2012-08-17 LAB — CBC WITH DIFFERENTIAL/PLATELET
Basophils Absolute: 0 10*3/uL (ref 0.0–0.1)
Eosinophils Absolute: 0.2 10*3/uL (ref 0.0–0.7)
Lymphocytes Relative: 11 % — ABNORMAL LOW (ref 12–46)
Lymphs Abs: 0.6 10*3/uL — ABNORMAL LOW (ref 0.7–4.0)
MCHC: 35 g/dL (ref 30.0–36.0)
Monocytes Relative: 9 % (ref 3–12)
Platelets: 34 10*3/uL — ABNORMAL LOW (ref 150–400)
RDW: 16.1 % — ABNORMAL HIGH (ref 11.5–15.5)
WBC: 5.7 10*3/uL (ref 4.0–10.5)

## 2012-08-17 LAB — ANTI-NUCLEAR AB-TITER (ANA TITER)

## 2012-08-17 LAB — BASIC METABOLIC PANEL
BUN: 15 mg/dL (ref 6–23)
CO2: 26 mEq/L (ref 19–32)
GFR calc non Af Amer: 46 mL/min — ABNORMAL LOW (ref >90)
Glucose, Bld: 86 mg/dL (ref 70–99)
Potassium: 3.7 mEq/L (ref 3.5–5.1)
Sodium: 139 mEq/L (ref 135–145)

## 2012-08-17 LAB — ANA: Anti Nuclear Antibody(ANA): POSITIVE — AB

## 2012-08-17 LAB — HEPATITIS C ANTIBODY: HCV Ab: NEGATIVE

## 2012-08-17 NOTE — Progress Notes (Signed)
Physical Therapy Treatment Patient Details Name: Jamie Munoz MRN: [id number] DOB: May 18, 1927 Today's Date: 08/17/2012 Time: 4403-4742 PT Time Calculation (min): 28 min  PT Assessment / Plan / Recommendation Comments on Treatment Session  Assisted pt OOB and right away corrected her "toe walking" gait to more of a flat foot with increased support thru RW.  Adjusted RW lower so that pt was more able to extend B elbows for increased WB thru RW.  Instructed pt that desite LE pain, a flat foot is more staedy/balance than just walking on her L toes.  Pt plans to D/c to home with 24/7 assist and pt agreed she needs a RW.    Follow Up Recommendations  Home health PT     Does the patient have the potential to tolerate intense rehabilitation     Barriers to Discharge        Equipment Recommendations  Rolling walker with 5" wheels;Other (comment)    Recommendations for Other Services    Frequency     Plan      Precautions / Restrictions Precautions Precautions: Fall Precaution Comments: pain, edema and erythema in both legs Restrictions Weight Bearing Restrictions: No Other Position/Activity Restrictions: WBAT   Pertinent Vitals/Pain C/o B LE "tightness" and "burning"    Mobility  Bed Mobility Bed Mobility: Supine to Sit Supine to Sit: 6: Modified independent (Device/Increase time);HOB flat Details for Bed Mobility Assistance: increased time Transfers Transfers: Sit to Stand;Stand to Sit Sit to Stand: 4: Min guard;From bed Stand to Sit: 4: Min guard;To chair/3-in-1 Details for Transfer Assistance: 25% Vc's on proper hand placement and increased time Ambulation/Gait Ambulation/Gait Assistance: 4: Min assist;4: Min guard Ambulation Distance (Feet): 68 Feet Assistive device: Rolling walker Ambulation/Gait Assistance Details: 75% VC's to increase heel strike L LE and avoid "toe walkig" for increase safety and balance.  Pt c/o LE "tightness" and "burning". Gait Pattern: Step-to  pattern;Decreased stance time - left;Trunk flexed Gait velocity: dcreased          PT Goals                                               progressing    Visit Information  Last PT Received On: 08/17/12    Subjective Data      Cognition    fair +   Balance   fair  End of Session PT - End of Session Activity Tolerance: Patient limited by fatigue;Patient limited by pain Patient left: in bed;with family/visitor present Nurse Communication: Mobility status (to MD)   Felecia Shelling  PTA [facility]  Acute  Rehab Pager      [phone]

## 2012-08-17 NOTE — Progress Notes (Signed)
[facility]    Date of Admission:  08/10/2012   Total days of antibiotics 5( not currently on abtx)          ID: Jamie Munoz is a 79 y.o. female with metastatic nonsmall cell lung ca presents with ongoing rash to lower extremities c/w leukocytoclastic vasculitis  Principal Problem:   Leukocytoclastic vasculitis Active Problems:   Lung cancer   Hypokalemia   Acute kidney injury   Non-small cell carcinoma of lung   Pancytopenia   Hyponatremia    Subjective: Afebrile; having pain with ambulation with physical therapy  Medications:  . calcium carbonate  500 mg of elemental calcium Oral BID WC  . cyproheptadine  4 mg Oral Once  . gabapentin  200 mg Oral TID  . OxyCODONE  20 mg Oral QHS  . polyethylene glycol  17 g Oral BID  . senna-docusate  1 tablet Oral QHS  . sodium chloride  3 mL Intravenous Q12H    Objective: Vital signs in last 24 hours: Temp:  [98.3 F (36.8 C)-98.4 F (36.9 C)] 98.4 F (36.9 C) (02/28 1436) Pulse Rate:  [79-86] 79 (02/28 1436) Resp:  [17-18] 17 (02/28 1436) BP: (124-157)/(41-58) 124/41 mmHg (02/28 1436) SpO2:  [96 %-98 %] 98 % (02/28 1436) gen = a xo by 3  Skin = unchanged over night, still has palpable purpuric erythamatous lesions with 2 small areas of central necrosis on right lower extremity. Left lower extremity diffusely erythamatous, +2 edema, non-blanching   Lab Results  Recent Labs  08/16/12 0545 08/17/12 0530  WBC 5.8 5.7  HGB 8.1* 7.7*  HCT 23.1* 22.0*  NA 139 139  K 4.1 3.7  CL 108 106  CO2 24 26  BUN 17 15  CREATININE 1.09 1.09   Liver Panel  Recent Labs  08/15/12 0340  PROT 4.9*  ALBUMIN 1.8*  AST 31  ALT 18  ALKPHOS 97  BILITOT 0.4   Sedimentation Rate  Recent Labs  08/16/12 1725  ESRSEDRATE 79*   C-Reactive Protein  Recent Labs  08/16/12 1725  CRP 11.9*   Lab Results  Component Value Date   ANA POSITIVE* 08/16/2012   RF 56* 08/16/2012    Microbiology: 2/21  blood cx NGTD Studies/Results: Dg Foot Complete Left  08/16/2012  *RADIOLOGY REPORT*  Clinical Data: Diffuse left foot pain.  LEFT FOOT - COMPLETE 3+ VIEW  Comparison: None  Findings: There is no evidence of fracture, subluxation or dislocation. Patchy osteopenia is noted. The Lisfranc joints are intact. No definite focal bony lesions are present. No radiographic evidence of osteomyelitis is noted.  IMPRESSION: No evidence of acute bony abnormality.  Patchy osteopenia.   Original Report Authenticated By: Harmon Pier, M.D.      Assessment/Plan:  vasculitis of right leg= clinically appears to be c/w LV. No need for antibiotics. Recommend outpatient follow up with dermatology to decide if further work up is needed and can decide if systemic steroids is needed. Per this presentation, I would not give steroids at this time and defer the decision to dermatologist. Limited healed area of necrosis < 1 cm. On anterior shin of right leg is unchanged  She does have + RF of 56. ANA is negative titer. Inflammatory markers are elevated CRP 11.9 and ESR 79  Chronic stasis changes of left leg = unchanged, consider gentle compression stalkings to help with edema  Patient has follow up with dermatology, dr. Londell Moh on March 4th at [facility]  [facility] Cell: [phone] Pager: [phone]  08/17/2012, 4:08 PM

## 2012-08-17 NOTE — Progress Notes (Signed)
[facility] PROGRESS NOTE  APARNA VANDERWEELE ZOX:[id number] DOB: 1927-04-10 DOA: 08/10/2012 PCP: [name], MD  Assessment/Plan:  #1 bilateral lower extremity leukocytoclastic vasculitis Patient symptoms per ID consistent with a leukocytoclastic vasculitis. Likely secondary to malignancy. Patient is currently afebrile. White count has normalized after Neupogen. MRI negative for abscess or fasciitis. Antibiotics have been discontinued. Xray negative. Monitor off antibiotics. ID to discuss with dermatology on starting patient on steroids. ID is following. Will need outpatient dermatology follow up which has been set up with Dr Londell Moh tues 08/21/12 at 11am.  #2 pancytopenia Likely secondary to recent chemotherapy. Status post Neupogen. White count has improved. Hemoglobin is at 7.7 and stable. Platelet count is at 34. No signs of bleeding. Follow. Per oncology.  #3 hypokalemia Repleted.  #4 metastatic non-small cell lung cancer diagnosed March of 2006 status post multiple treatments and currently on maintenance chemotherapy with single agent Alimta. Chemotherapy on hold per oncology recommendations. Per oncology.  #5 hyponatremia Resolved.  #6 acute renal insufficiency Resolved.  #7 rash on back May be a contact dermatitis. Rash is blanchable. Will asked nursing to change bed sheets. Hydroxyzine as needed. Continue Benadryl lotion.  #8 redness around Port-A-Cath Patient has been seen by interventional radiology and recommend monitoring for now.  #9 ?? Hypocalcemia Corrected calcium is 8.6. On calcium supplementation. Follow.    Code Status: Full Family Communication:  Admitted patient and family at bedside. Disposition Plan: Home when medically stable.   Consultants:  Infectious disease: Dr. Drue Second 08/14/2012  Hematology oncology: Dr. Myna Hidalgo 08/11/2012  Procedures:  MRI of the left tibia-fibula 08/11/2012  Xray of 08/16/12  Antibiotics:  IV vancomycin  08/10/2012--> 08/14/12  IV Aztreonam 08/10/12----> 08/14/12  Status post 2 units of packed red blood cells.  HPI/Subjective: Patient complaining of right lower extremity pain > L . Patient able to bear weight with walker today per PT. Patient denies any chest pain. No shortness of breath.  Objective: Filed Vitals:   08/16/12 0439 08/16/12 1341 08/16/12 2121 08/17/12 0655  BP: 140/51 132/49 157/51 146/58  Pulse: 75 82 85 86  Temp: 98.3 F (36.8 C) 98.7 F (37.1 C) 98.3 F (36.8 C) 98.3 F (36.8 C)  TempSrc: Oral Oral Oral Oral  Resp: 16 16 18 18   Height:      Weight:      SpO2: 96% 95% 96% 97%    Intake/Output Summary (Last 24 hours) at 08/17/12 1205 Last data filed at 08/17/12 0840  Gross per 24 hour  Intake    960 ml  Output    300 ml  Net    660 ml   Filed Weights   08/10/12 1654 08/10/12 1726  Weight: 63.7 kg (140 lb 6.9 oz) 62.9 kg (138 lb 10.7 oz)    Exam:   General:  NAD  Cardiovascular: RRR  Respiratory: CTAB  Abdomen: Soft, nontender, nondistended, positive bowel sounds.  Extremities: Right lower extremity with erythema, warmth, tenderness to palpation. Left lower extremity with some erythema which looks like chronic venous stasis changes  left of foot erythematous with some tenderness to palpation.  Skin: Back with a improving blanchable rash.  Data Reviewed: Basic Metabolic Panel:  Recent Labs Lab 08/13/12 0600 08/14/12 0410 08/15/12 0340 08/16/12 0545 08/17/12 0530  NA 137 136 134* 139 139  K 3.6 3.6 3.4* 4.1 3.7  CL 107 107 104 108 106  CO2 21 21 22 24 26   GLUCOSE 83 89 84 86 86  BUN 13 16 16 17  15  CREATININE 1.09 1.01 1.00 1.09 1.09  CALCIUM 6.5* 6.3* 6.8* 7.2* 7.5*   Liver Function Tests:  Recent Labs Lab 08/10/12 1322 08/15/12 0340  AST 30 31  ALT 15 18  ALKPHOS 78 97  BILITOT 0.6 0.4  PROT 6.3 4.9*  ALBUMIN 2.7* 1.8*   No results found for this basename: LIPASE, AMYLASE,  in the last 168 hours No results found for  this basename: AMMONIA,  in the last 168 hours CBC:  Recent Labs Lab 08/13/12 0600 08/14/12 0410 08/15/12 0340 08/16/12 0545 08/17/12 0530  WBC 8.4 7.8 5.2 5.8 5.7  NEUTROABS 7.4 6.6 4.1 4.5 4.4  HGB 8.6* 8.3* 7.9* 8.1* 7.7*  HCT 24.6* 23.6* 22.4* 23.1* 22.0*  MCV 98.4 98.3 97.8 98.3 98.7  PLT 35* 30* 26* 30* 34*   Cardiac Enzymes: No results found for this basename: CKTOTAL, CKMB, CKMBINDEX, TROPONINI,  in the last 168 hours BNP (last 3 results) No results found for this basename: PROBNP,  in the last 8760 hours CBG: No results found for this basename: GLUCAP,  in the last 168 hours  Recent Results (from the past 240 hour(s))  CULTURE, BLOOD (ROUTINE X 2)     Status: None   Collection Time    08/10/12 11:40 PM      Result Value Range Status   Specimen Description BLOOD RIGHT ANTECUBITAL   Final   Special Requests BOTTLES DRAWN AEROBIC AND ANAEROBIC 2.5CC   Final   Culture  Setup Time 08/11/2012 03:55   Final   Culture NO GROWTH 5 DAYS   Final   Report Status 08/17/2012 FINAL   Final  CULTURE, BLOOD (ROUTINE X 2)     Status: None   Collection Time    08/10/12 11:45 PM      Result Value Range Status   Specimen Description BLOOD LEFT ANTECUBITAL   Final   Special Requests BOTTLES DRAWN AEROBIC AND ANAEROBIC 5CC   Final   Culture  Setup Time 08/11/2012 03:42   Final   Culture NO GROWTH 5 DAYS   Final   Report Status 08/17/2012 FINAL   Final     Studies: Dg Foot Complete Left  08/16/2012  *RADIOLOGY REPORT*  Clinical Data: Diffuse left foot pain.  LEFT FOOT - COMPLETE 3+ VIEW  Comparison: None  Findings: There is no evidence of fracture, subluxation or dislocation. Patchy osteopenia is noted. The Lisfranc joints are intact. No definite focal bony lesions are present. No radiographic evidence of osteomyelitis is noted.  IMPRESSION: No evidence of acute bony abnormality.  Patchy osteopenia.   Original Report Authenticated By: Harmon Pier, M.D.     Scheduled Meds: . calcium  carbonate  500 mg of elemental calcium Oral BID WC  . cyproheptadine  4 mg Oral Once  . gabapentin  200 mg Oral TID  . OxyCODONE  20 mg Oral QHS  . polyethylene glycol  17 g Oral BID  . senna-docusate  1 tablet Oral QHS  . sodium chloride  3 mL Intravenous Q12H   Continuous Infusions:   Principal Problem:   Leukocytoclastic vasculitis Active Problems:   Lung cancer   Hypokalemia   Acute kidney injury   Non-small cell carcinoma of lung   Pancytopenia   Hyponatremia    Time spent: > 35 mins    [facility]  [facility] Pager [phone]. If 8PM-8AM, please contact night-coverage at [email or web address], password [facility] 08/17/2012, 12:05 PM  LOS: 7 days

## 2012-08-17 NOTE — Telephone Encounter (Signed)
Jamie Munoz called stating that she will not be in for appt Monday.  She needs some more time to recover from her current hospitalization.  She is expected to be d/c'd this weekend.  Informed Cherl to have pt call when she feels like she can come back in for a f/u appt.  Pt has already left a msg with schedulers to cancel appts for Monday.  SLJ

## 2012-08-18 DIAGNOSIS — D539 Nutritional anemia, unspecified: Secondary | ICD-10-CM

## 2012-08-18 LAB — CBC WITH DIFFERENTIAL/PLATELET
Basophils Absolute: 0 10*3/uL (ref 0.0–0.1)
Basophils Relative: 0 % (ref 0–1)
Eosinophils Absolute: 0.3 10*3/uL (ref 0.0–0.7)
Eosinophils Absolute: 0.3 10*3/uL (ref 0.0–0.7)
Eosinophils Relative: 5 % (ref 0–5)
HCT: 21.3 % — ABNORMAL LOW (ref 36.0–46.0)
HCT: 29 % — ABNORMAL LOW (ref 36.0–46.0)
Hemoglobin: 7.3 g/dL — ABNORMAL LOW (ref 12.0–15.0)
Lymphocytes Relative: 12 % (ref 12–46)
Lymphocytes Relative: 11 % — ABNORMAL LOW (ref 12–46)
MCHC: 34.3 g/dL (ref 30.0–36.0)
Monocytes Relative: 8 % (ref 3–12)
Monocytes Relative: 8 % (ref 3–12)
Neutro Abs: 3.8 10*3/uL (ref 1.7–7.7)
Neutro Abs: 3.8 10*3/uL (ref 1.7–7.7)
Neutrophils Relative %: 75 % (ref 43–77)
Neutrophils Relative %: 76 % (ref 43–77)
RBC: 2.16 MIL/uL — ABNORMAL LOW (ref 3.87–5.11)
RDW: 17.6 % — ABNORMAL HIGH (ref 11.5–15.5)
WBC: 5.1 10*3/uL (ref 4.0–10.5)
WBC: 5.1 10*3/uL (ref 4.0–10.5)

## 2012-08-18 LAB — BASIC METABOLIC PANEL
CO2: 26 mEq/L (ref 19–32)
Calcium: 7.6 mg/dL — ABNORMAL LOW (ref 8.4–10.5)
Glucose, Bld: 90 mg/dL (ref 70–99)
Potassium: 3.7 mEq/L (ref 3.5–5.1)
Sodium: 141 mEq/L (ref 135–145)

## 2012-08-18 LAB — PREPARE RBC (CROSSMATCH)

## 2012-08-18 MED ORDER — FUROSEMIDE 10 MG/ML IJ SOLN
20.0000 mg | Freq: Once | INTRAMUSCULAR | Status: AC
  Administered 2012-08-18: 20 mg via INTRAVENOUS
  Filled 2012-08-18: qty 2

## 2012-08-18 MED ORDER — DIPHENHYDRAMINE HCL 25 MG PO CAPS
25.0000 mg | ORAL_CAPSULE | Freq: Once | ORAL | Status: AC
  Administered 2012-08-18: 25 mg via ORAL
  Filled 2012-08-18: qty 1

## 2012-08-18 MED ORDER — POLYETHYLENE GLYCOL 3350 17 G PO PACK: 17.0000 g | PACK | Freq: Two times a day (BID) | ORAL | Status: DC

## 2012-08-18 MED ORDER — HEPARIN SOD (PORK) LOCK FLUSH 100 UNIT/ML IV SOLN
INTRAVENOUS | Status: AC
  Filled 2012-08-18: qty 5

## 2012-08-18 MED ORDER — DIPHENHYDRAMINE-ZINC ACETATE 2-0.1 % EX CREA: TOPICAL_CREAM | Freq: Two times a day (BID) | CUTANEOUS | Status: DC | PRN

## 2012-08-18 MED ORDER — SENNOSIDES-DOCUSATE SODIUM 8.6-50 MG PO TABS: 1.0000 | ORAL_TABLET | Freq: Every day | ORAL | Status: DC

## 2012-08-18 MED ORDER — ACETAMINOPHEN 325 MG PO TABS
650.0000 mg | ORAL_TABLET | Freq: Once | ORAL | Status: AC
  Administered 2012-08-18: 650 mg via ORAL
  Filled 2012-08-18: qty 2

## 2012-08-18 NOTE — Care Management Note (Signed)
   CARE MANAGEMENT NOTE 08/18/2012  Patient:  Jamie Munoz, Jamie Munoz   Account Number:  [id number]  Date Initiated:  08/17/2012  Documentation initiated by:  DAVIS,TYMEEKA  Subjective/Objective Assessment:   79 yo female admitted with BLe cellutlitis. PTA pt independent     Action/Plan:   Home with [facility]  08/18/2012 Met with pt who states that she does not think that she needs [facility] services. [facility] notifed that walker has not been delivered and d/c is expected.   Anticipated DC Date:  08/18/2012   Anticipated DC Plan:  HOME/SELF CARE      DC Planning Services  CM consult      Choice offered to / List presented to:  C-1 Patient   DME arranged  Levan Hurst      DME agency  [facility]        Status of service:  Completed, signed off Medicare Important Message given?   (If response is "NO", the following Medicare IM given date fields will be blank) Date Medicare IM given:   Date Additional Medicare IM given:    Discharge Disposition:    Per UR Regulation:  Reviewed for med. necessity/level of care/duration of stay  If discussed at Long Length of Stay Meetings, dates discussed:    Comments:   08/18/2012 Met with pt who states that she does not think that she will need HHPT/OT. Pt instructed to call PCP after d/c to home is she then determines that she would benefit from these services and PCP will arrange HHPT/OT. Noted order for walker and this has not been delivered by [facility]. [facility] notified of plan to d/c today and will deliver walker to room. [name] RN MPH, case manager  08/17/12 1600 Jamie Munoz [phone] CM spoke with patient  With sister at the bedside concerning discharge planning. Pt provided with choice list for [facility]. PT eval recommends HHPT/OT. No choice made at this time. Will continue to follow.   08/13/12 1000 Jamie Munoz [phone] CM spoke with patient with sister at bedside concerning discharge planning. Pt states from home. PTA  independent. Per pt sister and family assist with home care. Per pt no HH services or DME required upon discharge. CM will sign off.

## 2012-08-18 NOTE — Progress Notes (Signed)
08/18/12 Patient HGB 9.9 prior to discharge. [facility] brought patients walker. Reviewed discharge instructions with patient and her niece. Patient and niece both verbalized understanding. Copy of prescriptions and discharge papers given to patient. Patient currently doesn't want home health or PT.

## 2012-08-18 NOTE — Discharge Summary (Signed)
Physician Discharge Summary  Jamie Munoz QQV:[id number] DOB: May 17, 1927 DOA: 08/10/2012  PCP: [name], MD  Admit date: 08/10/2012 Discharge date: 08/18/2012  Time spent: 70 minutes  Recommendations for Outpatient Follow-up:  1. Patient is to followup with Dr. Londell Moh of [facility] dermatology on Tuesday, 08/22/2011 at 11 AM for evaluation of bilateral lower extremity leukocytoclastic vasculitis and blanchable rash on torso. 2. Patient is to followup with Dr. Arlis Porta of oncology one week post discharge. On followup CBC will need to be obtained to followup on patient's pancytopenia. At that time it will be decided per oncology when to resume patient's chemotherapy.  Discharge Diagnoses:  Principal Problem:   Leukocytoclastic vasculitis Active Problems:   Lung cancer   Hypokalemia   Acute kidney injury   Macrocytic anemia   Non-small cell carcinoma of lung   Pancytopenia   Hyponatremia   Discharge Condition: Stable and improved.  Diet recommendation: Regular.  Filed Weights   08/10/12 1654 08/10/12 1726  Weight: 63.7 kg (140 lb 6.9 oz) 62.9 kg (138 lb 10.7 oz)    History of present illness:  79 year old female with a history of metastatic non-small cell carcinoma of the lung, hypertension, anxiety presents with 3 day history of worsening left lower extremity edema, erythema, and pain. The patient has been struggling with this since early January. Initially, the patient was started on Keflex on 07/04/2012. There was no significant improvement. The patient was then given an IM dose of ceftriaxone and started on Cipro. Again, there is no significant improvement. The patient was admitted to the hospital between 07/17/2012 until January 30. She was placed on clindamycin IV and discharged on oral clindamycin. Apparently there was some mild improvement in her erythema and edema. Unfortunately, things worsened again approximately 3 days ago with the above symptoms. She went  to see her primary care provider 2 days ago prior to admission. The patient was started on clindamycin again. There is no improvement. As a result she presented to the emergency department today.  Regarding the patient's cancer, the patient has been followed by Dr. Arbutus Ped. She is s/p 6 cycles of carboplatin and Alimta. Her last chemo was single agent Alimta on 07/31/12. The patient's workup in the emergency department revealed that she had pancytopenia with a white blood cell count 0.7, hemoglobin 6.7, platelets 45,000. In addition, the patient was noted to have acute renal insufficiency with a serum creatinine of 1.29. Her baseline creatinine is between 0.8-1.0.   Hospital Course:  #1 bilateral lower extremity leukocytoclastic vasculitis  Patient was admitted with erythema bilateral lower extremities with edema left greater than right. There was initial concern that this likely be a noninfectious component. Patient had been treated prior to admission with antibiotics but recurrence of the symptoms. Blood cultures were obtained. Patient was empirically started on IV vancomycin and IV Aztreonam. These antibiotics were subsequently discontinued after 5 days of antibiotic therapy. MRI was obtained which was negative for any abscess or fasciitis. X-ray of the left foot was also obtained which was negative for any fracture. An ID consultation was obtained and patient was seen in consultation by Dr. Drue Second who felt that patient's presentation was likely secondary to a leukocytoclastic vasculitis secondary to patient's malignancy. Patient remained afebrile. Patient's white count normalized. IV antibiotics were subsequently discontinued and patient was monitored. PT/ OT saw patient and worked with patient during the hospitalization. It was recommended per infectious diseases that patient will need followup with dermatology as outpatient. Followup has been arranged and  patient is to followup with Dr. Londell Moh who agrees  with dermatology on Tuesday, 08/21/2012 at 11 AM for further evaluation and management. Patient be discharged in stable and improved condition.  #2 pancytopenia  During the hospitalization patient was noted to be pancytopenic. On admission patient was noted to have a hemoglobin of 6.7  , and white count of 0.7 and a platelet count of 45. Patient was placed on Neupogen with normalization of her neutropenia. Patient was transfused one unit of platelets. Patient was also transfused a total of 4 units of packed red blood cells during this hospitalization. Patient did not have any signs of bleeding. Patient was seen in consultation during this hospitalization by oncology. Patient will be discharged home in stable condition and is to followup with her oncologist one week post discharge. Patient was discharged in stable and improved condition.  #3 hypokalemia  Repleted.  #4 metastatic non-small cell lung cancer diagnosed March of 2006 status post multiple treatments and currently on maintenance chemotherapy with single agent Alimta.  Patient was seen in consultation during the hospitalization by oncology. Patient's chemotherapy was held during the hospitalization. Patient will followup with oncology as outpatient and the decision will be made at that time when to resume patient's chemotherapy. #5 hyponatremia  Patient was noted to be hyponatremic. Patient was hydrated with IV fluids with resolution of her hyponatremia.  #6 acute renal insufficiency  On admission patient was noted to be in acute renal insufficiency. This was felt to be secondary to prerenal azotemia. Patient was hydrated with IV fluids with resolution.  #7 rash on back  May be a contact dermatitis vs drug induced. Rash is blanchable. Patient improved with Benadryl lotion. Patient will need to followup with dermatology as outpatient. #8 redness around Port-A-Cath  On admission redness was noted around patient's Port-A-Cath. Patient was seen by  interventional radiology who felt: No signs or symptoms of infection. Patient remained afebrile. Was recommended to monitor.   The rest of patient's chronic medical issues remained stable throughout the hospitalization and patient was discharged in stable and improved condition.     Procedures: MRI of the left tibia-fibula 08/11/2012  Xray of 08/16/12 4 units of packed red blood cells. One unit of platelets.  Consultations:  ID: Dr. Drue Second 08/14/2012  Hematology oncology: Dr. Myna Hidalgo 08/11/12.  Discharge Exam: Filed Vitals:   08/18/12 1224 08/18/12 1238 08/18/12 1255 08/18/12 1346  BP: 142/54 149/55 155/60 146/51  Pulse: 77 75 79 77  Temp: 97.9 F (36.6 C) 97.9 F (36.6 C) 97.9 F (36.6 C) 98.2 F (36.8 C)  TempSrc: Oral Oral Oral Oral  Resp: 20 20 20 20   Height:      Weight:      SpO2:    99%    General: NAD Cardiovascular: RRR Respiratory: CTAB Extremities: Palpable. Erythematous lesions with 2 small areas of central necrosis on the right lower leg. Left lower extremity diffuse the erythematous with 1-2+ edema, nonblanching.  Discharge Instructions      Discharge Orders   Future Appointments Provider Department Dept Phone   08/20/2012 11:45 AM Dava Najjar [facility] MEDICAL ONCOLOGY [phone]   08/20/2012 12:15 PM Conni Slipper, [facility] [phone]   08/20/2012 1:00 PM [facility] [place] [facility] [phone]   09/10/2012 10:45 AM Delcie Roch [facility]COL[facility] [phone]   09/10/2012 11:15 AM [facility] [address] [place] [facility] [phone]  10/01/2012 10:45 AM Krista [facility] MEDICAL ONCOLOGY [phone]   10/22/2012 10:45 AM Delcie Roch [facility] MEDICAL ONCOLOGY [phone]   Future Orders Complete By Expires     Care order/instruction  08/12/2012 07/05/2013    Comments:       Transfuse Parameters    Diet general  As directed     Discharge instructions  As directed     Comments:      Followup with Dr. Shirline Frees oncology in 1 week. Followup with Dr. Londell Moh of dermatology.    Increase activity slowly  As directed         Medication List    STOP taking these medications       clindamycin 300 MG capsule  Commonly known as:  CLEOCIN     PRESCRIPTION MEDICATION      TAKE these medications       ALPRAZolam 0.25 MG tablet  Commonly known as:  XANAX  Take 0.25 mg by mouth 3 (three) times daily as needed for anxiety.     aspirin EC 81 MG tablet  Take 81 mg by mouth daily.     bisacodyl 5 MG EC tablet  Commonly known as:  DULCOLAX  Take 5 mg by mouth daily as needed for constipation.     clindamycin 1 % lotion  Commonly known as:  CLEOCIN T  Apply 1 application topically 2 (two) times daily. Applied to face.     dexamethasone 4 MG tablet  Commonly known as:  DECADRON  Take 4 mg by mouth 2 (two) times daily with a meal. Taken day before, day of and day after chemo.     diphenhydrAMINE-zinc acetate cream  Commonly known as:  BENADRYL  Apply topically 2 (two) times daily as needed for itching.     docusate sodium 100 MG capsule  Commonly known as:  COLACE  Take 100 mg by mouth 2 (two) times daily as needed for constipation.     gabapentin 100 MG capsule  Commonly known as:  NEURONTIN  Take 200 mg by mouth 3 (three) times daily.     lidocaine-prilocaine cream  Commonly known as:  EMLA  Apply 1 application topically as needed (for port-a-cath access.).     olmesartan 20 MG tablet  Commonly known as:  BENICAR  Take 20 mg by mouth daily.     oxyCODONE 20 MG 12 hr tablet  Commonly known as:  OXYCONTIN  Take 1 tablet (20 mg total) by mouth at bedtime.     polyethylene glycol packet  Commonly known as:  MIRALAX / GLYCOLAX  Take 17 g by mouth 2 (two) times daily.     prochlorperazine 10 MG tablet  Commonly known as:  COMPAZINE  Take 10  mg by mouth every 6 (six) hours as needed (for nausea.).     senna-docusate 8.6-50 MG per tablet  Commonly known as:  Senokot-S  Take 1 tablet by mouth at bedtime.     temazepam 15 MG capsule  Commonly known as:  RESTORIL  Take 15 mg by mouth at bedtime as needed for sleep.       Follow-up Information   Follow up with Angeline Slim, MD On 08/21/2012. (f/u appointment at 11am)    Contact information:   [address] [place] [address] [phone]       Follow up with Lajuana Matte., MD. Schedule an appointment as soon as possible for a visit in 1 week.  Contact information:   [address] [place] [address] [phone]        The results of significant diagnostics from this hospitalization (including imaging, microbiology, ancillary and laboratory) are listed below for reference.    Significant Diagnostic Studies: Mr Tibia Fibula Left Wo Contrast  08/11/2012  *RADIOLOGY REPORT*  Clinical Data: Left lower extremity pain and swelling.  MRI LEFT LOWER EXTREMITY WITHOUT CONTRAST  Technique:  Multiplanar, multisequence MR imaging of the left lower extremity was performed.  No intravenous contrast was administered.  Comparison:   None  Findings:  There is diffuse subcutaneous soft tissue swelling/edema and fluid involving entire left lower extremity most likely reflecting cellulitis.  No discrete soft tissue abscess.  No findings for myofasciitis or pyomyositis.  There are areas of focal fatty atrophy involving the calf musculature.  This could be related to previous muscle infarcts or denervation process.  No findings to suggest septic arthritis involving the medial ankle joints and no evidence of osteomyelitis involving the tibia or fibula.  IMPRESSION:  1.  Diffuse left lower extremity cellulitis without focal soft tissue abscess. 2.  No findings for myositis, pyomyositis, septic arthritis or osteomyelitis. 3.  Areas of focal fatty atrophy involving the calf  musculature likely due to prior muscle infarcts or denervation process.   Original Report Authenticated By: Rudie Meyer, M.D.    Dg Foot Complete Left  08/16/2012  *RADIOLOGY REPORT*  Clinical Data: Diffuse left foot pain.  LEFT FOOT - COMPLETE 3+ VIEW  Comparison: None  Findings: There is no evidence of fracture, subluxation or dislocation. Patchy osteopenia is noted. The Lisfranc joints are intact. No definite focal bony lesions are present. No radiographic evidence of osteomyelitis is noted.  IMPRESSION: No evidence of acute bony abnormality.  Patchy osteopenia.   Original Report Authenticated By: Harmon Pier, M.D.     Microbiology: Recent Results (from the past 240 hour(s))  CULTURE, BLOOD (ROUTINE X 2)     Status: None   Collection Time    08/10/12 11:40 PM      Result Value Range Status   Specimen Description BLOOD RIGHT ANTECUBITAL   Final   Special Requests BOTTLES DRAWN AEROBIC AND ANAEROBIC 2.5CC   Final   Culture  Setup Time 08/11/2012 03:55   Final   Culture NO GROWTH 5 DAYS   Final   Report Status 08/17/2012 FINAL   Final  CULTURE, BLOOD (ROUTINE X 2)     Status: None   Collection Time    08/10/12 11:45 PM      Result Value Range Status   Specimen Description BLOOD LEFT ANTECUBITAL   Final   Special Requests BOTTLES DRAWN AEROBIC AND ANAEROBIC 5CC   Final   Culture  Setup Time 08/11/2012 03:42   Final   Culture NO GROWTH 5 DAYS   Final   Report Status 08/17/2012 FINAL   Final     Labs: Basic Metabolic Panel:  Recent Labs Lab 08/14/12 0410 08/15/12 0340 08/16/12 0545 08/17/12 0530 08/18/12 0450  NA 136 134* 139 139 141  K 3.6 3.4* 4.1 3.7 3.7  CL 107 104 108 106 108  CO2 21 22 24 26 26   GLUCOSE 89 84 86 86 90  BUN 16 16 17 15 17   CREATININE 1.01 1.00 1.09 1.09 1.08  CALCIUM 6.3* 6.8* 7.2* 7.5* 7.6*   Liver Function Tests:  Recent Labs Lab 08/15/12 0340  AST 31  ALT 18  ALKPHOS 97  BILITOT 0.4  PROT 4.9*  ALBUMIN 1.8*   No results found for this  basename: LIPASE, AMYLASE,  in the last 168 hours No results found for this basename: AMMONIA,  in the last 168 hours CBC:  Recent Labs Lab 08/14/12 0410 08/15/12 0340 08/16/12 0545 08/17/12 0530 08/18/12 0450  WBC 7.8 5.2 5.8 5.7 5.1  NEUTROABS 6.6 4.1 4.5 4.4 3.8  HGB 8.3* 7.9* 8.1* 7.7* 7.3*  HCT 23.6* 22.4* 23.1* 22.0* 21.3*  MCV 98.3 97.8 98.3 98.7 98.6  PLT 30* 26* 30* 34* 38*   Cardiac Enzymes: No results found for this basename: CKTOTAL, CKMB, CKMBINDEX, TROPONINI,  in the last 168 hours BNP: BNP (last 3 results) No results found for this basename: PROBNP,  in the last 8760 hours CBG: No results found for this basename: GLUCAP,  in the last 168 hours     Signed:  Kylina Vultaggio  [facility] 08/18/2012, 2:27 PM

## 2012-08-19 LAB — TYPE AND SCREEN
ABO/RH(D): AB POS
Antibody Screen: NEGATIVE
Unit division: 0

## 2012-08-20 ENCOUNTER — Ambulatory Visit: Payer: Medicare Other | Admitting: Physician Assistant

## 2012-08-20 ENCOUNTER — Other Ambulatory Visit: Payer: Medicare Other | Admitting: Lab

## 2012-08-20 ENCOUNTER — Ambulatory Visit: Payer: Medicare Other

## 2012-08-27 ENCOUNTER — Telehealth: Payer: Self-pay | Admitting: Medical Oncology

## 2012-08-27 NOTE — Telephone Encounter (Signed)
Pt saw Dr Londell Moh  And has new diagnosis for leg problem. She said he started her on Prednisone 20 mg bid

## 2012-08-29 ENCOUNTER — Other Ambulatory Visit: Payer: Self-pay | Admitting: Medical Oncology

## 2012-08-29 MED ORDER — GABAPENTIN 100 MG PO CAPS: 200.0000 mg | ORAL_CAPSULE | Freq: Three times a day (TID) | ORAL | Status: DC

## 2012-08-29 NOTE — Telephone Encounter (Signed)
Received records from Dr Londell Moh and given to dr Arbutus Ped.

## 2012-08-29 NOTE — Telephone Encounter (Signed)
Faxed refill to [facility]

## 2012-08-31 ENCOUNTER — Other Ambulatory Visit: Payer: Self-pay | Admitting: *Deleted

## 2012-08-31 NOTE — Progress Notes (Signed)
Per Dr Donnald Garre, okay to schedule f/u appt in 2 weeks with lab work.  Onc tx schedule filled out.  SLJ

## 2012-09-04 ENCOUNTER — Telehealth: Payer: Self-pay | Admitting: Internal Medicine

## 2012-09-04 NOTE — Telephone Encounter (Signed)
lmonvm adviisng the pt of her lab and md appt on 09/17/2012@3 :15pm.

## 2012-09-05 ENCOUNTER — Ambulatory Visit: Payer: Medicare Other | Admitting: Internal Medicine

## 2012-09-10 ENCOUNTER — Other Ambulatory Visit: Payer: Medicare Other | Admitting: Lab

## 2012-09-10 ENCOUNTER — Ambulatory Visit: Payer: Medicare Other

## 2012-09-17 ENCOUNTER — Telehealth: Payer: Self-pay | Admitting: Internal Medicine

## 2012-09-17 ENCOUNTER — Other Ambulatory Visit (HOSPITAL_BASED_OUTPATIENT_CLINIC_OR_DEPARTMENT_OTHER): Payer: Medicare Other | Admitting: Lab

## 2012-09-17 ENCOUNTER — Telehealth: Payer: Self-pay | Admitting: Medical Oncology

## 2012-09-17 ENCOUNTER — Ambulatory Visit (HOSPITAL_BASED_OUTPATIENT_CLINIC_OR_DEPARTMENT_OTHER): Payer: Medicare Other | Admitting: Internal Medicine

## 2012-09-17 ENCOUNTER — Encounter: Payer: Self-pay | Admitting: Internal Medicine

## 2012-09-17 VITALS — BP 172/73 | HR 84 | Temp 98.7°F | Resp 20 | Ht 66.0 in | Wt 133.0 lb

## 2012-09-17 DIAGNOSIS — C3410 Malignant neoplasm of upper lobe, unspecified bronchus or lung: Secondary | ICD-10-CM

## 2012-09-17 DIAGNOSIS — L259 Unspecified contact dermatitis, unspecified cause: Secondary | ICD-10-CM

## 2012-09-17 DIAGNOSIS — C3491 Malignant neoplasm of unspecified part of right bronchus or lung: Secondary | ICD-10-CM

## 2012-09-17 DIAGNOSIS — C7951 Secondary malignant neoplasm of bone: Secondary | ICD-10-CM

## 2012-09-17 DIAGNOSIS — C7952 Secondary malignant neoplasm of bone marrow: Secondary | ICD-10-CM

## 2012-09-17 DIAGNOSIS — C3490 Malignant neoplasm of unspecified part of unspecified bronchus or lung: Secondary | ICD-10-CM

## 2012-09-17 LAB — COMPREHENSIVE METABOLIC PANEL (CC13)
Albumin: 2.8 g/dL — ABNORMAL LOW (ref 3.5–5.0)
Alkaline Phosphatase: 69 U/L (ref 40–150)
BUN: 25.2 mg/dL (ref 7.0–26.0)
CO2: 28 mEq/L (ref 22–29)
Calcium: 7.9 mg/dL — ABNORMAL LOW (ref 8.4–10.4)
Chloride: 106 mEq/L (ref 98–107)
Glucose: 88 mg/dl (ref 70–99)
Potassium: 4 mEq/L (ref 3.5–5.1)
Sodium: 144 mEq/L (ref 136–145)
Total Protein: 6.2 g/dL — ABNORMAL LOW (ref 6.4–8.3)

## 2012-09-17 LAB — CBC WITH DIFFERENTIAL/PLATELET
Basophils Absolute: 0 10*3/uL (ref 0.0–0.1)
Eosinophils Absolute: 0.2 10*3/uL (ref 0.0–0.5)
HGB: 9.3 g/dL — ABNORMAL LOW (ref 11.6–15.9)
MCV: 101.1 fL — ABNORMAL HIGH (ref 79.5–101.0)
MONO#: 0.9 10*3/uL (ref 0.1–0.9)
NEUT#: 6 10*3/uL (ref 1.5–6.5)
RBC: 2.74 10*6/uL — ABNORMAL LOW (ref 3.70–5.45)
RDW: 20 % — ABNORMAL HIGH (ref 11.2–14.5)
WBC: 7.7 10*3/uL (ref 3.9–10.3)
lymph#: 0.7 10*3/uL — ABNORMAL LOW (ref 0.9–3.3)

## 2012-09-17 NOTE — Telephone Encounter (Signed)
Message copied by Charma Igo on Mon Sep 17, 2012  5:30 PM ------      Message from: Conni Slipper      Created: Mon Sep 17, 2012  4:56 PM       Abnormal results, please call and notify patient to push po fluids ------

## 2012-09-17 NOTE — Telephone Encounter (Signed)
Pt instructed to increase fluids for the next several days -she voices understanding.

## 2012-09-17 NOTE — Patient Instructions (Signed)
Followup visit in 2 weeks after repeating CT scan of the chest, abdomen and pelvis

## 2012-09-17 NOTE — Progress Notes (Signed)
[facility] [phone]   [phone]  OFFICE PROGRESS NOTE  [name], MD [address] [place] [address]  DIAGNOSIS: Metastatic non-small cell lung cancer (T2 NX M1). Presented with right upper lobe lung mass as well as multiple pulmonary nodules and bone metastasis at L3 vertebral body. This was diagnosed in March of 2006.   PRIOR THERAPY:  1. Status post palliative radiotherapy to the L3 lesion under the care of Dr. Kathrynn Running completed September 17, 2004. 2. Status post 6 cycles of systemic chemotherapy with carboplatin and paclitaxel. Last dose was given January 01, 2005. 3. Status post palliative radiotherapy to the right upper lobe lung mass under the care of Dr. Kathrynn Running completed on 12/29/2010. 4. Status post treatment with Tarceva 150 mg p.o. daily started April 2007 through July 2012. 5. Tarceva 100 mg p.o. daily started July 2012. 6. Systemic chemotherapy with Carboplatin for AUC 5 and Alimta 500 mg/m2 given every 3 weeks. Status post 6 cycles.  CURRENT THERAPY:  1. Maintenance chemotherapy with single agent Alimta at 500 mg per meter squared given every 3 weeks, status post 1 cycle given on 07/30/2012. 2. Zometa 4 mg IV q. 3 months for bone metastasis  INTERVAL HISTORY: Jamie Munoz 79 y.o. female returns to the clinic today for followup visit accompanied. The patient is feeling fine today with no specific complaints. Her dermatitis in the lower extremity has significant improvement after the patient was treated with a course of prednisone in addition to local Clobestasol ointment by Dr. Leta Speller. She is feeling much better today. She denied having any significant weight loss or night sweats. She denied having any chest pain, shortness breath, cough or hemoptysis. The patient has been off treatment for the last few weeks secondary to hospitalization with the questionable cellulitis as well as dermatitis of the lower extremities. She  is here today for evaluation and discussion of her treatment options.  MEDICAL HISTORY: Past Medical History  Diagnosis Date  . Cancer   . Lung cancer   . Hypertension   . Cellulitis of left lower extremity 07/16/2012    ALLERGIES:  is allergic to codeine.  MEDICATIONS:  Current Outpatient Prescriptions  Medication Sig Dispense Refill  . ALPRAZolam (XANAX) 0.25 MG tablet Take 0.25 mg by mouth 3 (three) times daily as needed for anxiety.       Marland Kitchen aspirin EC 81 MG tablet Take 81 mg by mouth daily.      . bisacodyl (DULCOLAX) 5 MG EC tablet Take 5 mg by mouth daily as needed for constipation.       . clindamycin (CLEOCIN T) 1 % lotion Apply 1 application topically 2 (two) times daily. Applied to face.      . clobetasol ointment (TEMOVATE) 0.05 % Apply 1 application topically daily.      Marland Kitchen docusate sodium (COLACE) 100 MG capsule Take 100 mg by mouth 2 (two) times daily as needed for constipation.       . gabapentin (NEURONTIN) 100 MG capsule Take 2 capsules (200 mg total) by mouth 3 (three) times daily.  100 capsule  1  . lidocaine-prilocaine (EMLA) cream Apply 1 application topically as needed (for port-a-cath access.).       Marland Kitchen olmesartan (BENICAR) 20 MG tablet Take 20 mg by mouth daily.        Marland Kitchen oxyCODONE (OXYCONTIN) 20 MG 12 hr tablet Take 1 tablet (20 mg total) by mouth at bedtime.  30  tablet  0  . predniSONE (DELTASONE) 5 MG tablet Take 5 mg by mouth daily.      Marland Kitchen senna-docusate (SENOKOT-S) 8.6-50 MG per tablet Take 1 tablet by mouth at bedtime.      Marland Kitchen dexamethasone (DECADRON) 4 MG tablet Take 4 mg by mouth 2 (two) times daily with a meal. Taken day before, day of and day after chemo.      . polyethylene glycol (MIRALAX / GLYCOLAX) packet Take 17 g by mouth 2 (two) times daily.  14 each    . prochlorperazine (COMPAZINE) 10 MG tablet Take 10 mg by mouth every 6 (six) hours as needed (for nausea.).      Marland Kitchen temazepam (RESTORIL) 15 MG capsule Take 15 mg by mouth at bedtime as needed for  sleep.        No current facility-administered medications for this visit.   Facility-Administered Medications Ordered in Other Visits  Medication Dose Route Frequency Provider Last Rate Last Dose  . influenza  inactive virus vaccine (FLUZONE/FLUARIX) injection 0.5 mL  0.5 mL Intramuscular Once Si Gaul, MD        SURGICAL HISTORY:  Past Surgical History  Procedure Laterality Date  . Cholecystectomy    . Abdominal hysterectomy    . Breast surgery    . Vaginal sling      REVIEW OF SYSTEMS:  A comprehensive review of systems was negative.   PHYSICAL EXAMINATION: General appearance: alert, cooperative and no distress Head: Normocephalic, without obvious abnormality, atraumatic Neck: no adenopathy Resp: clear to auscultation bilaterally Cardio: regular rate and rhythm, S1, S2 normal, no murmur, click, rub or gallop GI: soft, non-tender; bowel sounds normal; no masses,  no organomegaly Extremities: extremities normal, atraumatic, no cyanosis or edema  ECOG PERFORMANCE STATUS: 1 - Symptomatic but completely ambulatory  Blood pressure 172/73, pulse 84, temperature 98.7 F (37.1 C), temperature source Oral, resp. rate 20, height 5\' 6"  (1.676 m), weight 133 lb (60.328 kg).  LABORATORY DATA: Lab Results  Component Value Date   WBC 7.7 09/17/2012   HGB 9.3* 09/17/2012   HCT 27.7* 09/17/2012   MCV 101.1* 09/17/2012   PLT 104* 09/17/2012      Chemistry      Component Value Date/Time   NA 144 09/17/2012 1450   NA 141 08/18/2012 0450   NA 139 09/02/2011 1406   K 4.0 09/17/2012 1450   K 3.7 08/18/2012 0450   K 3.9 09/02/2011 1406   CL 106 09/17/2012 1450   CL 108 08/18/2012 0450   CL 97* 09/02/2011 1406   CO2 28 09/17/2012 1450   CO2 26 08/18/2012 0450   CO2 29 09/02/2011 1406   BUN 25.2 09/17/2012 1450   BUN 17 08/18/2012 0450   BUN 18 09/02/2011 1406   CREATININE 1.5* 09/17/2012 1450   CREATININE 1.08 08/18/2012 0450   CREATININE 1.2 09/02/2011 1406      Component Value Date/Time    CALCIUM 7.9* 09/17/2012 1450   CALCIUM 7.6* 08/18/2012 0450   CALCIUM 8.0 09/02/2011 1406   ALKPHOS 69 09/17/2012 1450   ALKPHOS 97 08/15/2012 0340   ALKPHOS 71 09/02/2011 1406   AST 17 09/17/2012 1450   AST 31 08/15/2012 0340   AST 25 09/02/2011 1406   ALT 11 09/17/2012 1450   ALT 18 08/15/2012 0340   BILITOT 0.64 09/17/2012 1450   BILITOT 0.4 08/15/2012 0340   BILITOT 0.60 09/02/2011 1406       RADIOGRAPHIC STUDIES: No results found.  ASSESSMENT: This is  a very pleasant 79 years old white female with history of metastatic non-small cell lung cancermost recently treated with maintenance chemotherapy with single agent Alimta which has been on hold secondary to bilateral lower extremity dermatitis and questionable cellulitis. The patient is feeling much better today.   PLAN: I have a lengthy discussion with the patient today about her condition. I recommended for her to have repeat CT scan of the chest, abdomen and pelvis for restaging of her disease before discussion of the next step in her treatment. I may consider the patient for treatment with oral Tarceva again as she has done well with it for over 5 years and there are some reports that it may work again after a peroid of treatment interruption.  She would come back for followup visit in 2 weeks for evaluation and discussion of her scan results and treatment options. She was advised to call immediately if she has any concerning symptoms in the interval.  All questions were answered. The patient knows to call the clinic with any problems, questions or concerns. We can certainly see the patient much sooner if necessary.

## 2012-09-19 ENCOUNTER — Other Ambulatory Visit: Payer: Self-pay | Admitting: Internal Medicine

## 2012-09-19 ENCOUNTER — Ambulatory Visit (HOSPITAL_COMMUNITY)
Admission: RE | Admit: 2012-09-19 | Discharge: 2012-09-19 | Disposition: A | Discharge status: Home / Self Care | Payer: Medicare Other | Source: Ambulatory Visit | Attending: Internal Medicine | Admitting: Internal Medicine

## 2012-09-19 ENCOUNTER — Encounter (HOSPITAL_COMMUNITY): Payer: Self-pay

## 2012-09-19 DIAGNOSIS — C3490 Malignant neoplasm of unspecified part of unspecified bronchus or lung: Secondary | ICD-10-CM | POA: Insufficient documentation

## 2012-09-19 DIAGNOSIS — K5710 Diverticulosis of small intestine without perforation or abscess without bleeding: Secondary | ICD-10-CM | POA: Insufficient documentation

## 2012-09-19 DIAGNOSIS — K4020 Bilateral inguinal hernia, without obstruction or gangrene, not specified as recurrent: Secondary | ICD-10-CM | POA: Insufficient documentation

## 2012-09-19 DIAGNOSIS — I2510 Atherosclerotic heart disease of native coronary artery without angina pectoris: Secondary | ICD-10-CM | POA: Insufficient documentation

## 2012-09-19 DIAGNOSIS — C3491 Malignant neoplasm of unspecified part of right bronchus or lung: Secondary | ICD-10-CM

## 2012-09-19 DIAGNOSIS — Z923 Personal history of irradiation: Secondary | ICD-10-CM | POA: Insufficient documentation

## 2012-09-19 DIAGNOSIS — K838 Other specified diseases of biliary tract: Secondary | ICD-10-CM | POA: Insufficient documentation

## 2012-09-19 DIAGNOSIS — K228 Other specified diseases of esophagus: Secondary | ICD-10-CM | POA: Insufficient documentation

## 2012-09-19 DIAGNOSIS — Z79899 Other long term (current) drug therapy: Secondary | ICD-10-CM | POA: Insufficient documentation

## 2012-09-19 DIAGNOSIS — K2289 Other specified disease of esophagus: Secondary | ICD-10-CM | POA: Insufficient documentation

## 2012-09-19 DIAGNOSIS — J984 Other disorders of lung: Secondary | ICD-10-CM | POA: Insufficient documentation

## 2012-09-20 ENCOUNTER — Ambulatory Visit (HOSPITAL_COMMUNITY): Payer: Medicare Other

## 2012-10-01 ENCOUNTER — Other Ambulatory Visit: Payer: Medicare Other | Admitting: Lab

## 2012-10-02 ENCOUNTER — Encounter: Payer: Self-pay | Admitting: Internal Medicine

## 2012-10-02 ENCOUNTER — Other Ambulatory Visit (HOSPITAL_BASED_OUTPATIENT_CLINIC_OR_DEPARTMENT_OTHER): Payer: Medicare Other | Admitting: Lab

## 2012-10-02 ENCOUNTER — Telehealth: Payer: Self-pay | Admitting: *Deleted

## 2012-10-02 ENCOUNTER — Ambulatory Visit (HOSPITAL_BASED_OUTPATIENT_CLINIC_OR_DEPARTMENT_OTHER): Payer: Medicare Other | Admitting: Internal Medicine

## 2012-10-02 VITALS — BP 148/70 | HR 76 | Temp 97.6°F | Resp 20 | Ht 66.0 in | Wt 135.2 lb

## 2012-10-02 DIAGNOSIS — M7989 Other specified soft tissue disorders: Secondary | ICD-10-CM

## 2012-10-02 DIAGNOSIS — C3410 Malignant neoplasm of upper lobe, unspecified bronchus or lung: Secondary | ICD-10-CM

## 2012-10-02 DIAGNOSIS — C3491 Malignant neoplasm of unspecified part of right bronchus or lung: Secondary | ICD-10-CM

## 2012-10-02 DIAGNOSIS — C7952 Secondary malignant neoplasm of bone marrow: Secondary | ICD-10-CM

## 2012-10-02 DIAGNOSIS — C7951 Secondary malignant neoplasm of bone: Secondary | ICD-10-CM

## 2012-10-02 LAB — COMPREHENSIVE METABOLIC PANEL (CC13)
AST: 21 U/L (ref 5–34)
Albumin: 2.5 g/dL — ABNORMAL LOW (ref 3.5–5.0)
Alkaline Phosphatase: 69 U/L (ref 40–150)
Calcium: 8.7 mg/dL (ref 8.4–10.4)
Chloride: 105 mEq/L (ref 98–107)
Potassium: 4.5 mEq/L (ref 3.5–5.1)
Sodium: 141 mEq/L (ref 136–145)
Total Protein: 6.3 g/dL — ABNORMAL LOW (ref 6.4–8.3)

## 2012-10-02 LAB — CBC WITH DIFFERENTIAL/PLATELET
Basophils Absolute: 0 10*3/uL (ref 0.0–0.1)
EOS%: 1.5 % (ref 0.0–7.0)
Eosinophils Absolute: 0.1 10*3/uL (ref 0.0–0.5)
HGB: 8.1 g/dL — ABNORMAL LOW (ref 11.6–15.9)
MCH: 34.7 pg — ABNORMAL HIGH (ref 25.1–34.0)
MCV: 101.9 fL — ABNORMAL HIGH (ref 79.5–101.0)
MONO%: 11 % (ref 0.0–14.0)
NEUT#: 3.7 10*3/uL (ref 1.5–6.5)
RBC: 2.33 10*6/uL — ABNORMAL LOW (ref 3.70–5.45)
RDW: 18.5 % — ABNORMAL HIGH (ref 11.2–14.5)
lymph#: 0.7 10*3/uL — ABNORMAL LOW (ref 0.9–3.3)

## 2012-10-02 NOTE — Patient Instructions (Signed)
No evidence for disease progression on his recent scan. Followup visit in 3 months with repeat CT scan of the chest, abdomen and pelvis. Zometa every 3 months.

## 2012-10-02 NOTE — Telephone Encounter (Signed)
Per staff message and POF I have scheduled appts.  Jamie Munoz  

## 2012-10-02 NOTE — Progress Notes (Signed)
[facility] [phone]   [phone]  OFFICE PROGRESS NOTE  Jamie Hoit, MD [address] [place] [address]  DIAGNOSIS: Metastatic non-small cell lung cancer (T2 NX M1). Presented with right upper lobe lung mass as well as multiple pulmonary nodules and bone metastasis at L3 vertebral body. This was diagnosed in March of 2006.   PRIOR THERAPY:  1. Status post palliative radiotherapy to the L3 lesion under the care of Dr. Kathrynn Running completed September 17, 2004. 2. Status post 6 cycles of systemic chemotherapy with carboplatin and paclitaxel. Last dose was given January 01, 2005. 3. Status post palliative radiotherapy to the right upper lobe lung mass under the care of Dr. Kathrynn Running completed on 12/29/2010. 4. Status post treatment with Tarceva 150 mg p.o. daily started April 2007 through July 2012. 5. Tarceva 100 mg p.o. daily started July 2012. 6. Systemic chemotherapy with Carboplatin for AUC 5 and Alimta 500 mg/m2 given every 3 weeks. Status post 6 cycles. 7. Maintenance chemotherapy with single agent Alimta at 500 mg per meter squared given every 3 weeks, status post 1 cycle given on 07/30/2012.  CURRENT THERAPY:  1. Zometa 4 mg IV q. 3 months for bone metastasis.   INTERVAL HISTORY: Jamie Munoz 79 y.o. female returns to the clinic today for followup visit accompanied friend. The patient is feeling fine today with no specific complaints except for mild swelling of the lower extremities left more than right. She denied having any significant chest pain, shortness breath, cough or hemoptysis. The patient denied having any significant weight loss or night sweats. She is feeling much better compared to a few weeks ago. She has been off treatment since February of 2014. The patient has repeat CT scan of the chest, abdomen and pelvis performed recently and she is here for evaluation and discussion of her scan results.  MEDICAL HISTORY: Past Medical  History  Diagnosis Date  . Cancer   . Lung cancer   . Hypertension   . Cellulitis of left lower extremity 07/16/2012    ALLERGIES:  is allergic to codeine.  MEDICATIONS:  Current Outpatient Prescriptions  Medication Sig Dispense Refill  . ALPRAZolam (XANAX) 0.25 MG tablet Take 0.25 mg by mouth 3 (three) times daily as needed for anxiety.       Marland Kitchen aspirin EC 81 MG tablet Take 81 mg by mouth daily.      . bisacodyl (DULCOLAX) 5 MG EC tablet Take 5 mg by mouth daily as needed for constipation.       . clindamycin (CLEOCIN T) 1 % lotion Apply 1 application topically 2 (two) times daily. Applied to face.      Marland Kitchen dexamethasone (DECADRON) 4 MG tablet Take 4 mg by mouth 2 (two) times daily with a meal. Taken day before, day of and day after chemo.      . gabapentin (NEURONTIN) 100 MG capsule Take 2 capsules (200 mg total) by mouth 3 (three) times daily.  100 capsule  1  . lidocaine-prilocaine (EMLA) cream Apply 1 application topically as needed (for port-a-cath access.).       Marland Kitchen olmesartan (BENICAR) 20 MG tablet Take 20 mg by mouth daily.        Marland Kitchen oxyCODONE (OXYCONTIN) 20 MG 12 hr tablet Take 1 tablet (20 mg total) by mouth at bedtime.  30 tablet  0  . polyethylene glycol (MIRALAX / GLYCOLAX) packet Take 17 g by mouth 2 (two) times daily.  14 each    . senna-docusate (SENOKOT-S) 8.6-50 MG per tablet Take 1 tablet by mouth at bedtime.      . temazepam (RESTORIL) 15 MG capsule Take 15 mg by mouth at bedtime as needed for sleep.       . clobetasol ointment (TEMOVATE) 0.05 % Apply 1 application topically daily.      Marland Kitchen docusate sodium (COLACE) 100 MG capsule Take 100 mg by mouth 2 (two) times daily as needed for constipation.       . prochlorperazine (COMPAZINE) 10 MG tablet Take 10 mg by mouth every 6 (six) hours as needed (for nausea.).       No current facility-administered medications for this visit.   Facility-Administered Medications Ordered in Other Visits  Medication Dose Route Frequency  Provider Last Rate Last Dose  . influenza  inactive virus vaccine (FLUZONE/FLUARIX) injection 0.5 mL  0.5 mL Intramuscular Once Si Gaul, MD        SURGICAL HISTORY:  Past Surgical History  Procedure Laterality Date  . Cholecystectomy    . Abdominal hysterectomy    . Breast surgery    . Vaginal sling      REVIEW OF SYSTEMS:  A comprehensive review of systems was negative except for: Constitutional: positive for fatigue Swelling of the lower extremities.   PHYSICAL EXAMINATION: General appearance: alert, cooperative, fatigued and no distress Head: Normocephalic, without obvious abnormality, atraumatic Neck: no adenopathy Lymph nodes: Cervical, supraclavicular, and axillary nodes normal. Resp: clear to auscultation bilaterally Cardio: regular rate and rhythm, S1, S2 normal, no murmur, click, rub or gallop GI: soft, non-tender; bowel sounds normal; no masses,  no organomegaly Extremities: extremities normal, atraumatic, no cyanosis with 1+ edema in the left lower extremity Neurologic: Alert and oriented X 3, normal strength and tone. Normal symmetric reflexes. Normal coordination and gait  ECOG PERFORMANCE STATUS: 1 - Symptomatic but completely ambulatory  Blood pressure 148/70, pulse 76, temperature 97.6 F (36.4 C), temperature source Oral, resp. rate 20, height 5\' 6"  (1.676 m), weight 135 lb 3.2 oz (61.326 kg).  LABORATORY DATA: Lab Results  Component Value Date   WBC 5.0 10/02/2012   HGB 8.1* 10/02/2012   HCT 23.8* 10/02/2012   MCV 101.9* 10/02/2012   PLT 155 10/02/2012      Chemistry      Component Value Date/Time   NA 144 09/17/2012 1450   NA 141 08/18/2012 0450   NA 139 09/02/2011 1406   K 4.0 09/17/2012 1450   K 3.7 08/18/2012 0450   K 3.9 09/02/2011 1406   CL 106 09/17/2012 1450   CL 108 08/18/2012 0450   CL 97* 09/02/2011 1406   CO2 28 09/17/2012 1450   CO2 26 08/18/2012 0450   CO2 29 09/02/2011 1406   BUN 25.2 09/17/2012 1450   BUN 17 08/18/2012 0450   BUN 18  09/02/2011 1406   CREATININE 1.5* 09/17/2012 1450   CREATININE 1.08 08/18/2012 0450   CREATININE 1.2 09/02/2011 1406      Component Value Date/Time   CALCIUM 7.9* 09/17/2012 1450   CALCIUM 7.6* 08/18/2012 0450   CALCIUM 8.0 09/02/2011 1406   ALKPHOS 69 09/17/2012 1450   ALKPHOS 97 08/15/2012 0340   ALKPHOS 71 09/02/2011 1406   AST 17 09/17/2012 1450   AST 31 08/15/2012 0340   AST 25 09/02/2011 1406   ALT 11 09/17/2012 1450   ALT 18 08/15/2012 0340   BILITOT 0.64 09/17/2012 1450   BILITOT 0.4 08/15/2012 0340   BILITOT 0.60  09/02/2011 1406       RADIOGRAPHIC STUDIES: Ct Chest Wo Contrast  09/19/2012  *RADIOLOGY REPORT*  Clinical Data:  Lung cancer.  Chemotherapy in progress.  Radiation therapy complete.  CT CHEST, ABDOMEN AND PELVIS WITHOUT CONTRAST  Technique:  Multidetector CT imaging of the chest, abdomen and pelvis was performed following the standard protocol without IV contrast.  Comparison:  07/12/2012.   CT CHEST  Findings:  No pathologically enlarged mediastinal or axillary lymph nodes.  Hilar regions are difficult to definitively characterize without IV contrast.  Heart size normal.  Atherosclerotic calcification of the arterial vasculature, including coronary arteries.  No pericardial effusion. Postoperative changes in the right breast are noted. Esophagus is mildly dilated throughout its course, which can be seen with dysmotility.  Mild biapical pleural parenchymal scarring.  Right upper lobe nodular soft tissue measures 2.8 x 2.7 cm and is likely stable when slight differences in measurement technique are considered.  Mild surrounding architectural distortion and volume loss. Lungs are otherwise clear. Trace left pleural fluid.  Airway is unremarkable.  IMPRESSION: Right upper lobe nodular soft tissue with surrounding mild architectural distortion and volume loss, stable.   CT ABDOMEN AND PELVIS  Findings:  Mild intrahepatic and extrahepatic biliary duct dilatation, as before.  Findings may relate  to cholecystectomy. Liver, gallbladder, adrenal glands, kidneys, spleen, pancreas and stomach are otherwise unremarkable.  There is a duodenal diverticulum.  Small bowel and colon are otherwise unremarkable.  Small bilateral inguinal hernias contain fat.  Scattered atherosclerotic calcification of the arterial vasculature without abdominal aortic aneurysm.  Retroaortic left renal vein is incidentally noted.  No pathologically enlarged lymph nodes.  No free fluid.  No worrisome lytic or sclerotic lesions.  L3 vertebroplasty.  IMPRESSION: No evidence of metastatic disease in the abdomen or pelvis.   Original Report Authenticated By: Leanna Battles, M.D.     ASSESSMENT: This is a very pleasant 79 years old white female with metastatic non-small cell lung cancer diagnosed in March of 2006 is status post several chemotherapy regimen as well as oral Tarceva and palliative radiation. She was recently treated with maintenance Alimta status post 1 cycle discontinued in February of 2014 secondary to frequent hospitalization with lower extremity cellulitis and dermatitis. Her recent scan showed no evidence for disease progression.  PLAN: I discussed the scan results with the patient today. I recommended for her to continue on observation with repeat CT scan of the chest, abdomen and pelvis in 3 months. For the bone metastasis, the patient will continue on treatment with Zometa every 3 months. She was advised to call immediately if she has any concerning symptoms in the interval. Regarding the lower extremity cellulitis and dermatitis, the patient will continue her routine followup visit with her dermatologist as well as primary care physician.  All questions were answered. The patient knows to call the clinic with any problems, questions or concerns. We can certainly see the patient much sooner if necessary.

## 2012-10-03 ENCOUNTER — Telehealth: Payer: Self-pay | Admitting: Internal Medicine

## 2012-10-03 NOTE — Telephone Encounter (Signed)
s.w. pt and advised on 4.17.14 appt...at nxt visit she will get JULY schedule and barium.Marland KitchenMarland Kitchen

## 2012-10-04 ENCOUNTER — Ambulatory Visit (HOSPITAL_BASED_OUTPATIENT_CLINIC_OR_DEPARTMENT_OTHER): Payer: Medicare Other

## 2012-10-04 VITALS — BP 156/72 | HR 73 | Temp 96.8°F | Resp 17

## 2012-10-04 DIAGNOSIS — C3410 Malignant neoplasm of upper lobe, unspecified bronchus or lung: Secondary | ICD-10-CM

## 2012-10-04 DIAGNOSIS — C3491 Malignant neoplasm of unspecified part of right bronchus or lung: Secondary | ICD-10-CM

## 2012-10-04 DIAGNOSIS — C7952 Secondary malignant neoplasm of bone marrow: Secondary | ICD-10-CM

## 2012-10-04 MED ORDER — HEPARIN SOD (PORK) LOCK FLUSH 100 UNIT/ML IV SOLN
500.0000 [IU] | Freq: Once | INTRAVENOUS | Status: AC | PRN
  Administered 2012-10-04: 500 [IU]
  Filled 2012-10-04: qty 5

## 2012-10-04 MED ORDER — SODIUM CHLORIDE 0.9 % IJ SOLN
10.0000 mL | INTRAMUSCULAR | Status: DC | PRN
Start: 2012-10-04 — End: 2012-10-04
  Administered 2012-10-04: 10 mL
  Filled 2012-10-04: qty 10

## 2012-10-04 MED ORDER — SODIUM CHLORIDE 0.9 % IV SOLN
Freq: Once | INTRAVENOUS | Status: AC
  Administered 2012-10-04: 15:00:00 via INTRAVENOUS

## 2012-10-04 MED ORDER — ZOLEDRONIC ACID 4 MG/100ML IV SOLN
4.0000 mg | Freq: Once | INTRAVENOUS | Status: AC
  Administered 2012-10-04: 4 mg via INTRAVENOUS
  Filled 2012-10-04: qty 100

## 2012-10-04 NOTE — Patient Instructions (Addendum)
Zoledronic Acid injection (Hypercalcemia, Oncology) What is this medicine? ZOLEDRONIC ACID (ZOE le dron ik AS id) lowers the amount of calcium loss from bone. It is used to treat too much calcium in your blood from cancer. It is also used to prevent complications of cancer that has spread to the bone. This medicine may be used for other purposes; ask your health care provider or pharmacist if you have questions. What should I tell my health care provider before I take this medicine? They need to know if you have any of these conditions: -aspirin-sensitive asthma -dental disease -kidney disease -an unusual or allergic reaction to zoledronic acid, other medicines, foods, dyes, or preservatives -pregnant or trying to get pregnant -breast-feeding How should I use this medicine? This medicine is for infusion into a vein. It is given by a health care professional in a hospital or clinic setting. Talk to your pediatrician regarding the use of this medicine in children. Special care may be needed. Overdosage: If you think you have taken too much of this medicine contact a poison control center or emergency room at once. NOTE: This medicine is only for you. Do not share this medicine with others. What if I miss a dose? It is important not to miss your dose. Call your doctor or health care professional if you are unable to keep an appointment. What may interact with this medicine? -certain antibiotics given by injection -NSAIDs, medicines for pain and inflammation, like ibuprofen or naproxen -some diuretics like bumetanide, furosemide -teriparatide -thalidomide This list may not describe all possible interactions. Give your health care provider a list of all the medicines, herbs, non-prescription drugs, or dietary supplements you use. Also tell them if you smoke, drink alcohol, or use illegal drugs. Some items may interact with your medicine. What should I watch for while using this medicine? Visit  your doctor or health care professional for regular checkups. It may be some time before you see the benefit from this medicine. Do not stop taking your medicine unless your doctor tells you to. Your doctor may order blood tests or other tests to see how you are doing. Women should inform their doctor if they wish to become pregnant or think they might be pregnant. There is a potential for serious side effects to an unborn child. Talk to your health care professional or pharmacist for more information. You should make sure that you get enough calcium and vitamin D while you are taking this medicine. Discuss the foods you eat and the vitamins you take with your health care professional. Some people who take this medicine have severe bone, joint, and/or muscle pain. This medicine may also increase your risk for a broken thigh bone. Tell your doctor right away if you have pain in your upper leg or groin. Tell your doctor if you have any pain that does not go away or that gets worse. What side effects may I notice from receiving this medicine? Side effects that you should report to your doctor or health care professional as soon as possible: -allergic reactions like skin rash, itching or hives, swelling of the face, lips, or tongue -anxiety, confusion, or depression -breathing problems -changes in vision -feeling faint or lightheaded, falls -jaw burning, cramping, pain -muscle cramps, stiffness, or weakness -trouble passing urine or change in the amount of urine Side effects that usually do not require medical attention (report to your doctor or health care professional if they continue or are bothersome): -bone, joint, or muscle pain -  fever -hair loss -irritation at site where injected -loss of appetite -nausea, vomiting -stomach upset -tired This list may not describe all possible side effects. Call your doctor for medical advice about side effects. You may report side effects to FDA at  1-800-FDA-1088. Where should I keep my medicine? This drug is given in a hospital or clinic and will not be stored at home. NOTE: This sheet is a summary. It may not cover all possible information. If you have questions about this medicine, talk to your doctor, pharmacist, or health care provider.  2013, [employer]. (12/03/2010 9:06:58 AM)  

## 2012-10-10 ENCOUNTER — Other Ambulatory Visit: Payer: Self-pay | Admitting: *Deleted

## 2012-10-10 MED ORDER — OXYCODONE HCL 20 MG PO TB12: 20.0000 mg | ORAL_TABLET | Freq: Every day | ORAL | Status: DC

## 2012-10-10 NOTE — Telephone Encounter (Signed)
Per pt request, rx has been mailed to pt's home address.  SLJ

## 2012-10-22 ENCOUNTER — Other Ambulatory Visit: Payer: Medicare Other | Admitting: Lab

## 2012-11-01 ENCOUNTER — Other Ambulatory Visit: Payer: Self-pay | Admitting: Internal Medicine

## 2012-11-06 ENCOUNTER — Other Ambulatory Visit: Payer: Self-pay | Admitting: *Deleted

## 2012-11-06 MED ORDER — OXYCODONE HCL 20 MG PO TB12: 20.0000 mg | ORAL_TABLET | Freq: Every day | ORAL | Status: DC

## 2012-11-06 NOTE — Telephone Encounter (Signed)
Per patient request, rx mailed to home address.  SLJ

## 2012-11-15 ENCOUNTER — Ambulatory Visit (HOSPITAL_BASED_OUTPATIENT_CLINIC_OR_DEPARTMENT_OTHER): Payer: Medicare Other

## 2012-11-15 VITALS — BP 134/69 | HR 69 | Temp 97.1°F | Resp 18

## 2012-11-15 DIAGNOSIS — C3490 Malignant neoplasm of unspecified part of unspecified bronchus or lung: Secondary | ICD-10-CM

## 2012-11-15 DIAGNOSIS — C3491 Malignant neoplasm of unspecified part of right bronchus or lung: Secondary | ICD-10-CM

## 2012-11-15 MED ORDER — SODIUM CHLORIDE 0.9 % IJ SOLN
10.0000 mL | INTRAMUSCULAR | Status: DC | PRN
  Administered 2012-11-15: 10 mL via INTRAVENOUS
  Filled 2012-11-15: qty 10

## 2012-11-15 MED ORDER — HEPARIN SOD (PORK) LOCK FLUSH 100 UNIT/ML IV SOLN
500.0000 [IU] | Freq: Once | INTRAVENOUS | Status: AC
  Administered 2012-11-15: 500 [IU] via INTRAVENOUS
  Filled 2012-11-15: qty 5

## 2012-11-15 NOTE — Patient Instructions (Signed)
Call MD for problems or concerns 

## 2012-12-11 ENCOUNTER — Other Ambulatory Visit: Payer: Self-pay | Admitting: *Deleted

## 2012-12-11 MED ORDER — OXYCODONE HCL 20 MG PO TB12: 20.0000 mg | ORAL_TABLET | Freq: Every day | ORAL | Status: DC

## 2012-12-11 NOTE — Telephone Encounter (Signed)
Oxycontin rx mailed to pt.

## 2012-12-14 ENCOUNTER — Other Ambulatory Visit: Payer: Self-pay | Admitting: Medical Oncology

## 2012-12-14 MED ORDER — OXYCODONE HCL 20 MG PO TB12: 20.0000 mg | ORAL_TABLET | Freq: Every day | ORAL | Status: DC

## 2012-12-14 NOTE — Telephone Encounter (Signed)
Pt called to say she did not get her rx in the mail for her oxycontin. It was mailed out Monday 6/23.

## 2012-12-14 NOTE — Addendum Note (Signed)
Addended by: Charma Igo on: 12/14/2012 03:34 PM   Modules accepted: Orders, Medications

## 2012-12-14 NOTE — Telephone Encounter (Signed)
Per Dr Arbutus Ped .Okay for Oxycontin 20 mg #15 tablets. Patient notified and I instructed her to tear up other oxycontin if it comes in mail

## 2013-01-01 ENCOUNTER — Other Ambulatory Visit (HOSPITAL_BASED_OUTPATIENT_CLINIC_OR_DEPARTMENT_OTHER): Payer: Medicare Other | Admitting: Lab

## 2013-01-01 ENCOUNTER — Ambulatory Visit (HOSPITAL_COMMUNITY)
Admission: RE | Admit: 2013-01-01 | Discharge: 2013-01-01 | Disposition: A | Discharge status: Home / Self Care | Payer: Medicare Other | Source: Ambulatory Visit | Attending: Internal Medicine | Admitting: Internal Medicine

## 2013-01-01 ENCOUNTER — Encounter (HOSPITAL_COMMUNITY): Payer: Self-pay

## 2013-01-01 DIAGNOSIS — K838 Other specified diseases of biliary tract: Secondary | ICD-10-CM | POA: Insufficient documentation

## 2013-01-01 DIAGNOSIS — C3491 Malignant neoplasm of unspecified part of right bronchus or lung: Secondary | ICD-10-CM

## 2013-01-01 DIAGNOSIS — C3490 Malignant neoplasm of unspecified part of unspecified bronchus or lung: Secondary | ICD-10-CM | POA: Insufficient documentation

## 2013-01-01 DIAGNOSIS — C7951 Secondary malignant neoplasm of bone: Secondary | ICD-10-CM | POA: Insufficient documentation

## 2013-01-01 LAB — CBC WITH DIFFERENTIAL/PLATELET
BASO%: 0.4 % (ref 0.0–2.0)
Basophils Absolute: 0 10*3/uL (ref 0.0–0.1)
EOS%: 1.2 % (ref 0.0–7.0)
HCT: 26.8 % — ABNORMAL LOW (ref 34.8–46.6)
HGB: 9.4 g/dL — ABNORMAL LOW (ref 11.6–15.9)
LYMPH%: 18.7 % (ref 14.0–49.7)
MCH: 35.3 pg — ABNORMAL HIGH (ref 25.1–34.0)
MCHC: 35.1 g/dL (ref 31.5–36.0)
MCV: 100.4 fL (ref 79.5–101.0)
NEUT%: 70.3 % (ref 38.4–76.8)
Platelets: 139 10*3/uL — ABNORMAL LOW (ref 145–400)

## 2013-01-01 LAB — COMPREHENSIVE METABOLIC PANEL (CC13)
BUN: 23.6 mg/dL (ref 7.0–26.0)
CO2: 26 mEq/L (ref 22–29)
Calcium: 8.5 mg/dL (ref 8.4–10.4)
Chloride: 106 mEq/L (ref 98–109)
Creatinine: 1.1 mg/dL (ref 0.6–1.1)
Glucose: 90 mg/dl (ref 70–140)
Total Bilirubin: 0.32 mg/dL (ref 0.20–1.20)

## 2013-01-01 MED ORDER — IOHEXOL 300 MG/ML  SOLN
100.0000 mL | Freq: Once | INTRAMUSCULAR | Status: AC | PRN
  Administered 2013-01-01: 100 mL via INTRAVENOUS

## 2013-01-03 ENCOUNTER — Encounter: Payer: Self-pay | Admitting: Internal Medicine

## 2013-01-03 ENCOUNTER — Other Ambulatory Visit: Payer: Medicare Other | Admitting: Lab

## 2013-01-03 ENCOUNTER — Ambulatory Visit (HOSPITAL_BASED_OUTPATIENT_CLINIC_OR_DEPARTMENT_OTHER): Payer: Medicare Other

## 2013-01-03 ENCOUNTER — Telehealth: Payer: Self-pay | Admitting: Internal Medicine

## 2013-01-03 ENCOUNTER — Ambulatory Visit (HOSPITAL_BASED_OUTPATIENT_CLINIC_OR_DEPARTMENT_OTHER): Payer: Medicare Other | Admitting: Internal Medicine

## 2013-01-03 VITALS — BP 131/63 | HR 76 | Temp 98.3°F | Resp 20 | Ht 66.0 in | Wt 136.3 lb

## 2013-01-03 DIAGNOSIS — C3410 Malignant neoplasm of upper lobe, unspecified bronchus or lung: Secondary | ICD-10-CM

## 2013-01-03 DIAGNOSIS — C3490 Malignant neoplasm of unspecified part of unspecified bronchus or lung: Secondary | ICD-10-CM

## 2013-01-03 DIAGNOSIS — C7952 Secondary malignant neoplasm of bone marrow: Secondary | ICD-10-CM

## 2013-01-03 DIAGNOSIS — R5381 Other malaise: Secondary | ICD-10-CM

## 2013-01-03 DIAGNOSIS — C3491 Malignant neoplasm of unspecified part of right bronchus or lung: Secondary | ICD-10-CM

## 2013-01-03 MED ORDER — OXYCODONE HCL 20 MG PO TB12: 20.0000 mg | ORAL_TABLET | Freq: Every day | ORAL | Status: DC

## 2013-01-03 MED ORDER — SODIUM CHLORIDE 0.9 % IV SOLN
Freq: Once | INTRAVENOUS | Status: AC
  Administered 2013-01-03: 13:00:00 via INTRAVENOUS

## 2013-01-03 MED ORDER — ZOLEDRONIC ACID 4 MG/5ML IV CONC
4.0000 mg | Freq: Once | INTRAVENOUS | Status: AC
  Administered 2013-01-03: 4 mg via INTRAVENOUS
  Filled 2013-01-03: qty 5

## 2013-01-03 MED ORDER — SODIUM CHLORIDE 0.9 % IJ SOLN
10.0000 mL | INTRAMUSCULAR | Status: DC | PRN
  Administered 2013-01-03: 10 mL
  Filled 2013-01-03: qty 10

## 2013-01-03 MED ORDER — ERLOTINIB HCL 100 MG PO TABS: 100.0000 mg | ORAL_TABLET | Freq: Every day | ORAL | Status: DC

## 2013-01-03 MED ORDER — HEPARIN SOD (PORK) LOCK FLUSH 100 UNIT/ML IV SOLN
500.0000 [IU] | Freq: Once | INTRAVENOUS | Status: AC | PRN
  Administered 2013-01-03: 500 [IU]
  Filled 2013-01-03: qty 5

## 2013-01-03 NOTE — Progress Notes (Signed)
[facility] [phone]   [phone]) [phone]  OFFICE PROGRESS NOTE  Jamie Hoit, MD [address] [place] [address]  DIAGNOSIS: Metastatic non-small cell lung cancer (T2 NX M1). Presented with right upper lobe lung mass as well as multiple pulmonary nodules and bone metastasis at L3 vertebral body. This was diagnosed in March of 2006.   PRIOR THERAPY:  1. Status post palliative radiotherapy to the L3 lesion under the care of Dr. Kathrynn Running completed September 17, 2004. 2. Status post 6 cycles of systemic chemotherapy with carboplatin and paclitaxel. Last dose was given January 01, 2005. 3. Status post palliative radiotherapy to the right upper lobe lung mass under the care of Dr. Kathrynn Running completed on 12/29/2010. 4. Status post treatment with Tarceva 150 mg p.o. daily started April 2007 through July 2012. 5. Tarceva 100 mg p.o. daily started July 2012. 6. Systemic chemotherapy with Carboplatin for AUC 5 and Alimta 500 mg/m2 given every 3 weeks. Status post 6 cycles. 7. Maintenance chemotherapy with single agent Alimta at 500 mg per meter squared given every 3 weeks, status post 1 cycle given on 07/30/2012.  CURRENT THERAPY:  1. Zometa 4 mg IV q. 3 months for bone metastasis. 2. The patient will resume treatment with Tarceva 100 mg by mouth daily and the next few days.   INTERVAL HISTORY: Jamie Munoz 79 y.o. female returns to the clinic today for followup visit accompanied by his friend. The patient is feeling fine today with no specific complaints except for mild fatigue. She denied having any significant chest pain, shortness of breath, cough or hemoptysis. She has no nausea or vomiting, no weight loss or night sweats. The patient has been observation for the last 6 months. She had repeat CT scan of the chest, abdomen and pelvis performed recently and she is here for evaluation and discussion of her scan results.  MEDICAL HISTORY: Past Medical History    Diagnosis Date  . Cancer   . Lung cancer   . Hypertension   . Cellulitis of left lower extremity 07/16/2012    ALLERGIES:  is allergic to codeine.  MEDICATIONS:  Current Outpatient Prescriptions  Medication Sig Dispense Refill  . aspirin EC 81 MG tablet Take 81 mg by mouth daily.      . clobetasol ointment (TEMOVATE) 0.05 % Apply 1 application topically daily.      Marland Kitchen dexamethasone (DECADRON) 4 MG tablet Take 4 mg by mouth 2 (two) times daily with a meal. Taken day before, day of and day after chemo.      . gabapentin (NEURONTIN) 100 MG capsule TAKE (2) CAPSULES THREE TIMES DAILY.  180 capsule  1  . lidocaine-prilocaine (EMLA) cream Apply 1 application topically as needed (for port-a-cath access.).       Marland Kitchen olmesartan (BENICAR) 20 MG tablet Take 20 mg by mouth daily.        Marland Kitchen oxyCODONE (OXYCONTIN) 20 MG 12 hr tablet Take 1 tablet (20 mg total) by mouth at bedtime.  30 tablet  0  . senna-docusate (SENOKOT-S) 8.6-50 MG per tablet Take 1 tablet by mouth at bedtime.      . temazepam (RESTORIL) 15 MG capsule Take 15 mg by mouth at bedtime as needed for sleep.       Marland Kitchen ALPRAZolam (XANAX) 0.25 MG tablet Take 0.25 mg by mouth 3 (three) times daily as needed for anxiety.       . bisacodyl (DULCOLAX) 5 MG EC tablet  Take 5 mg by mouth daily as needed for constipation.       . clindamycin (CLEOCIN T) 1 % lotion Apply 1 application topically 2 (two) times daily. Applied to face.      . docusate sodium (COLACE) 100 MG capsule Take 100 mg by mouth 2 (two) times daily as needed for constipation.       . polyethylene glycol (MIRALAX / GLYCOLAX) packet Take 17 g by mouth 2 (two) times daily.  14 each    . prochlorperazine (COMPAZINE) 10 MG tablet Take 10 mg by mouth every 6 (six) hours as needed (for nausea.).       No current facility-administered medications for this visit.   Facility-Administered Medications Ordered in Other Visits  Medication Dose Route Frequency Provider Last Rate Last Dose  .  influenza  inactive virus vaccine (FLUZONE/FLUARIX) injection 0.5 mL  0.5 mL Intramuscular Once Si Gaul, MD        SURGICAL HISTORY:  Past Surgical History  Procedure Laterality Date  . Cholecystectomy    . Abdominal hysterectomy    . Breast surgery    . Vaginal sling      REVIEW OF SYSTEMS:  A comprehensive review of systems was negative except for: Constitutional: positive for fatigue   PHYSICAL EXAMINATION: General appearance: alert, cooperative and no distress Head: Normocephalic, without obvious abnormality, atraumatic Neck: no adenopathy Lymph nodes: Cervical, supraclavicular, and axillary nodes normal. Resp: clear to auscultation bilaterally Cardio: regular rate and rhythm, S1, S2 normal, no murmur, click, rub or gallop GI: soft, non-tender; bowel sounds normal; no masses,  no organomegaly Extremities: extremities normal, atraumatic, no cyanosis or edema Neurologic: Alert and oriented X 3, normal strength and tone. Normal symmetric reflexes. Normal coordination and gait  ECOG PERFORMANCE STATUS: 1 - Symptomatic but completely ambulatory  Blood pressure 131/63, pulse 76, temperature 98.3 F (36.8 C), temperature source Oral, resp. rate 20, height 5\' 6"  (1.676 m), weight 136 lb 4.8 oz (61.825 kg).  LABORATORY DATA: Lab Results  Component Value Date   WBC 4.9 01/01/2013   HGB 9.4* 01/01/2013   HCT 26.8* 01/01/2013   MCV 100.4 01/01/2013   PLT 139* 01/01/2013      Chemistry      Component Value Date/Time   NA 141 01/01/2013 0926   NA 141 08/18/2012 0450   NA 139 09/02/2011 1406   K 4.4 01/01/2013 0926   K 3.7 08/18/2012 0450   K 3.9 09/02/2011 1406   CL 105 10/02/2012 1056   CL 108 08/18/2012 0450   CL 97* 09/02/2011 1406   CO2 26 01/01/2013 0926   CO2 26 08/18/2012 0450   CO2 29 09/02/2011 1406   BUN 23.6 01/01/2013 0926   BUN 17 08/18/2012 0450   BUN 18 09/02/2011 1406   CREATININE 1.1 01/01/2013 0926   CREATININE 1.08 08/18/2012 0450   CREATININE 1.2 09/02/2011 1406        Component Value Date/Time   CALCIUM 8.5 01/01/2013 0926   CALCIUM 7.6* 08/18/2012 0450   CALCIUM 8.0 09/02/2011 1406   ALKPHOS 63 01/01/2013 0926   ALKPHOS 97 08/15/2012 0340   ALKPHOS 71 09/02/2011 1406   AST 19 01/01/2013 0926   AST 31 08/15/2012 0340   AST 25 09/02/2011 1406   ALT 12 01/01/2013 0926   ALT 18 08/15/2012 0340   BILITOT 0.32 01/01/2013 0926   BILITOT 0.4 08/15/2012 0340   BILITOT 0.60 09/02/2011 1406       RADIOGRAPHIC STUDIES: Ct Chest W  Contrast  01/01/2013   *RADIOLOGY REPORT*  Clinical Data:  Restaging lung cancer  CT CHEST, ABDOMEN AND PELVIS WITH CONTRAST  Technique:  Multidetector CT imaging of the chest, abdomen and pelvis was performed following the standard protocol during bolus administration of intravenous contrast.  Contrast: OMNIPAQUE IOHEXOL 300 MG/ML  SOLN  Comparison:  09/19/2012    CT CHEST  Findings:  There is no pleural effusion identified.  Mass within the right upper lobe measures 3.2 cm, image number 17/series 5. This is compared with 2.8 cm previously.  Small nodule within the superior segment of right lower lobe measures 4 mm, image 20/series 5.  This is compared with 3 mm previously the left lung appears clear.  The heart size appears normal.  The no pericardial effusion identified.  No enlarged mediastinal or hilar lymph nodes identified.  There is no axillary or supraclavicular adenopathy.  Peripherally sclerotic manubrial lesion measuring 8 mm is unchanged from prior examinations.  IMPRESSION: 1.  Right upper lobe lung lesion has increased in size from previous exam. 2.  Stable metastasis within the sternal manubrium.    CT ABDOMEN AND PELVIS  Findings:  There is no suspicious liver abnormality identified. Prior cholecystectomy.  Increased caliber of the common bile duct measures 11 mm.  There is mild intrahepatic bile duct dilatation. Normal appearance of the pancreas.  The spleen is remarkable.  The adrenal glands are both normal.  Normal appearance  of the right kidney.  The left kidney is also normal.  The urinary bladder is unremarkable.  Calcified atherosclerotic disease affects the abdominal aorta. There is no aneurysm.  No upper abdominal adenopathy identified. There is no pelvic or inguinal adenopathy noted.  No free fluid or abnormal fluid collections within the abdomen or pelvis.  The stomach and the small bowel loops have a normal course and caliber.  No obstruction.  Normal appearance of the proximal colon.  There are scattered distal colonic diverticula without acute inflammation.  Review of the visualized osseous structures shows a peripherally sclerotic lesion involving the L1 vertebra.  This is unchanged from previous exam.  Compression fracture involving the L3 vertebra is again identified.  This has been treated with bone cement and the appearance is unchanged from previous exam.  IMPRESSION: 1.  No evidence for mass or adenopathy within the abdomen or pelvis.  2.  Stable increased caliber of the common bile duct with mild intrahepatic biliary dilatation.  3.  Stable appearance of sclerotic lesion involving the L1 vertebra.   Original Report Authenticated By: Signa Kell, M.D.    ASSESSMENT AND PLAN: This is a very pleasant 79 years old white female with metastatic non-small cell lung cancer, adenocarcinoma and has been observation since February of 2014 but now has evidence for disease progression in the right upper lobe lung mass. I discussed the scan results with the patient today. I recommended for her to resume treatment was Tarceva 100 mg by mouth daily. I discussed with the patient adverse effect of this treatment including but not limited to a skin rash, diarrhea, interstitial lung disease, liver or renal dysfunction. The patient is very familiar with the treatment as she has been on Tarceva for several years in the past. I will send a prescription to [facility] patient pharmacy. She would come back for followup visit  in 2 weeks for evaluation and management any adverse effect of her treatment. The patient was advised to call immediately if she has any concerning symptoms in  the interval.  All questions were answered. The patient knows to call the clinic with any problems, questions or concerns. We can certainly see the patient much sooner if necessary.  I spent 15 minutes counseling the patient face to face. The total time spent in the appointment was 25 minutes.

## 2013-01-03 NOTE — Progress Notes (Signed)
[employer], [id number], approved tarceva 100mg  from 01/03/13-07/06/13.

## 2013-01-03 NOTE — Telephone Encounter (Signed)
gave pt appt for lab and ML on 01/17/13

## 2013-01-03 NOTE — Patient Instructions (Signed)
 ZOLEDRONIC ACID (ZOE le dron ik AS id) lowers the amount of calcium loss from bone. It is used to treat too much calcium in your blood from cancer. It is also used to prevent complications of cancer that has spread to the bone. This medicine may be used for other purposes; ask your health care provider or pharmacist if you have questions. What should I tell my health care provider before I take this medicine? They need to know if you have any of these conditions: -aspirin-sensitive asthma -dental disease -kidney disease -an unusual or allergic reaction to zoledronic acid, other medicines, foods, dyes, or preservatives -pregnant or trying to get pregnant -breast-feeding How should I use this medicine? This medicine is for infusion into a vein. It is given by a health care professional in a hospital or clinic setting. Talk to your pediatrician regarding the use of this medicine in children. Special care may be needed. Overdosage: If you think you have taken too much of this medicine contact a poison control center or emergency room at once. NOTE: This medicine is only for you. Do not share this medicine with others. What if I miss a dose? It is important not to miss your dose. Call your doctor or health care professional if you are unable to keep an appointment. What may interact with this medicine? -certain antibiotics given by injection -NSAIDs, medicines for pain and inflammation, like ibuprofen or naproxen -some diuretics like bumetanide, furosemide -teriparatide -thalidomide This list may not describe all possible interactions. Give your health care provider a list of all the medicines, herbs, non-prescription drugs, or dietary supplements you use. Also tell them if you smoke, drink alcohol, or use illegal drugs. Some items may interact with your medicine. What should I watch for while using this medicine? Visit your doctor or health care professional for regular checkups. It may be  some time before you see the benefit from this medicine. Do not stop taking your medicine unless your doctor tells you to. Your doctor may order blood tests or other tests to see how you are doing. Women should inform their doctor if they wish to become pregnant or think they might be pregnant. There is a potential for serious side effects to an unborn child. Talk to your health care professional or pharmacist for more information. You should make sure that you get enough calcium and vitamin D while you are taking this medicine. Discuss the foods you eat and the vitamins you take with your health care professional. Some people who take this medicine have severe bone, joint, and/or muscle pain. This medicine may also increase your risk for a broken thigh bone. Tell your doctor right away if you have pain in your upper leg or groin. Tell your doctor if you have any pain that does not go away or that gets worse. What side effects may I notice from receiving this medicine? Side effects that you should report to your doctor or health care professional as soon as possible: -allergic reactions like skin rash, itching or hives, swelling of the face, lips, or tongue -anxiety, confusion, or depression -breathing problems -changes in vision -feeling faint or lightheaded, falls -jaw burning, cramping, pain -muscle cramps, stiffness, or weakness -trouble passing urine or change in the amount of urine Side effects that usually do not require medical attention (report to your doctor or health care professional if they continue or are bothersome): -bone, joint, or muscle pain -fever -hair loss -irritation at site where injected -  loss of appetite -nausea, vomiting -stomach upset -tired This list may not describe all possible side effects. Call your doctor for medical advice about side effects. You may report side effects to FDA at 1-800-FDA-1088. Where should I keep my medicine? This drug is given in a  hospital or clinic and will not be stored at home. NOTE: This sheet is a summary. It may not cover all possible information. If you have questions about this medicine, talk to your doctor, pharmacist, or health care provider.  2012, [employer]. (12/03/2010 9:06:58 AM) 

## 2013-01-04 ENCOUNTER — Other Ambulatory Visit: Payer: Self-pay | Admitting: Internal Medicine

## 2013-01-04 ENCOUNTER — Encounter: Payer: Self-pay | Admitting: *Deleted

## 2013-01-04 NOTE — Progress Notes (Signed)
RECEIVED A FAX FROM [facility] CONCERNING A PRIOR AUTHORIZATION FOR TARCEVA. THIS REQUEST WAS PLACED IN THE MANAGED CARE BIN.

## 2013-01-05 NOTE — Patient Instructions (Signed)
The scan showed evidence for disease progression in the right upper lobe lung mass. We discussed resuming treatment with Tarceva. Followup visit in 2 weeks.

## 2013-01-17 ENCOUNTER — Telehealth: Payer: Self-pay | Admitting: Internal Medicine

## 2013-01-17 ENCOUNTER — Ambulatory Visit (HOSPITAL_BASED_OUTPATIENT_CLINIC_OR_DEPARTMENT_OTHER): Payer: Medicare Other | Admitting: Physician Assistant

## 2013-01-17 ENCOUNTER — Encounter: Payer: Self-pay | Admitting: Physician Assistant

## 2013-01-17 ENCOUNTER — Other Ambulatory Visit (HOSPITAL_BASED_OUTPATIENT_CLINIC_OR_DEPARTMENT_OTHER): Payer: Medicare Other | Admitting: Lab

## 2013-01-17 VITALS — BP 126/59 | HR 74 | Temp 97.9°F | Resp 20 | Ht 66.0 in | Wt 135.3 lb

## 2013-01-17 DIAGNOSIS — C3490 Malignant neoplasm of unspecified part of unspecified bronchus or lung: Secondary | ICD-10-CM

## 2013-01-17 LAB — CBC WITH DIFFERENTIAL/PLATELET
Basophils Absolute: 0 10*3/uL (ref 0.0–0.1)
Eosinophils Absolute: 0 10*3/uL (ref 0.0–0.5)
HCT: 27.9 % — ABNORMAL LOW (ref 34.8–46.6)
HGB: 9.8 g/dL — ABNORMAL LOW (ref 11.6–15.9)
MONO#: 0.4 10*3/uL (ref 0.1–0.9)
NEUT#: 3.2 10*3/uL (ref 1.5–6.5)
RDW: 13.2 % (ref 11.2–14.5)
lymph#: 0.8 10*3/uL — ABNORMAL LOW (ref 0.9–3.3)

## 2013-01-17 LAB — COMPREHENSIVE METABOLIC PANEL (CC13)
AST: 18 U/L (ref 5–34)
Albumin: 3.3 g/dL — ABNORMAL LOW (ref 3.5–5.0)
BUN: 24.4 mg/dL (ref 7.0–26.0)
CO2: 26 mEq/L (ref 22–29)
Calcium: 8.5 mg/dL (ref 8.4–10.4)
Chloride: 109 mEq/L (ref 98–109)
Glucose: 91 mg/dl (ref 70–140)
Potassium: 4.2 mEq/L (ref 3.5–5.1)

## 2013-01-17 NOTE — Patient Instructions (Addendum)
Continue taking Tarceva 100 mg by mouth daily Continue to use clindamycin solution on your rash as needed

## 2013-01-17 NOTE — Telephone Encounter (Signed)
gv pt appt schedule for August. Per 7/30 pof lb once every 4w.

## 2013-01-17 NOTE — Progress Notes (Addendum)
[facility] [phone]   [phone]  SHARED VISIT PROGRESS NOTE  [name], MD [address] [place] [address]  DIAGNOSIS: Metastatic non-small cell lung cancer (T2 NX M1). Presented with right upper lobe lung mass as well as multiple pulmonary nodules and bone metastasis at L3 vertebral body. This was diagnosed in March of 2006.   PRIOR THERAPY:  1. Status post palliative radiotherapy to the L3 lesion under the care of Dr. Kathrynn Running completed September 17, 2004. 2. Status post 6 cycles of systemic chemotherapy with carboplatin and paclitaxel. Last dose was given January 01, 2005. 3. Status post palliative radiotherapy to the right upper lobe lung mass under the care of Dr. Kathrynn Running completed on 12/29/2010. 4. Status post treatment with Tarceva 150 mg p.o. daily started April 2007 through July 2012. 5. Tarceva 100 mg p.o. daily started July 2012. 6. Systemic chemotherapy with Carboplatin for AUC 5 and Alimta 500 mg/m2 given every 3 weeks. Status post 6 cycles. 7. Maintenance chemotherapy with single agent Alimta at 500 mg per meter squared given every 3 weeks, status post 1 cycle given on 07/30/2012.  CURRENT THERAPY:  1. Zometa 4 mg IV q. 3 months for bone metastasis. 2. Tarceva 100 mg by mouth daily resumed therapy on 01/08/11  DISEASE STAGE: Stage IV  CHEMOTHERAPY INTENT: Palliative  CURRENT # OF CHEMOTHERAPY CYCLES: <1  CURRENT ANTIEMETICS: Compazine  CURRENT SMOKING STATUS: Never Smoker  ORAL CHEMOTHERAPY AND CONSENT: yes  CURRENT BISPHOSPHONATES USE: Zometa every 3 months  PAIN MANAGEMENT: OxyContin  NARCOTICS INDUCED CONSTIPATION: yes but well managed with Senokot-S and as needed Dulcolax  LIVING WILL AND CODE STATUS:     INTERVAL HISTORY: ADRIEN DIETZMAN 79 y.o. female returns to the clinic today for followup visit accompanied by his friend. The patient is feeling fine today with no specific complaints except for mild  fatigue. She denied having any significant chest pain, shortness of breath, cough or hemoptysis. She has no nausea or vomiting, no weight loss or night sweats. The patient has been observation for the last 6 months. She had repeat CT scan of the chest, abdomen and pelvis performed recently and revealed evidence for disease progression in the right upper lobe mass. She has resumed therapy with Tarceva at 100 mg by mouth daily starting 01/07/2013. Overall she's tolerated this medication relatively well except for the beginnings of a mild rash of the base of her neck along the hairline. She denied any diarrhea. She has long-standing issues with constipation and these continue. She does not require any prescription refills today.  MEDICAL HISTORY: Past Medical History  Diagnosis Date  . Cancer   . Lung cancer   . Hypertension   . Cellulitis of left lower extremity 07/16/2012    ALLERGIES:  is allergic to codeine.  MEDICATIONS:  Current Outpatient Prescriptions  Medication Sig Dispense Refill  . ALPRAZolam (XANAX) 0.25 MG tablet Take 0.25 mg by mouth 3 (three) times daily as needed for anxiety.       Marland Kitchen aspirin EC 81 MG tablet Take 81 mg by mouth daily.      . bisacodyl (DULCOLAX) 5 MG EC tablet Take 5 mg by mouth daily as needed for constipation.       . clindamycin (CLEOCIN T) 1 % lotion Apply 1 application topically 2 (two) times daily. Applied to face.      . clobetasol ointment (TEMOVATE) 0.05 % Apply 1 application topically daily.      Marland Kitchen  dexamethasone (DECADRON) 4 MG tablet Take 4 mg by mouth 2 (two) times daily with a meal. Taken day before, day of and day after chemo.      . docusate sodium (COLACE) 100 MG capsule Take 100 mg by mouth 2 (two) times daily as needed for constipation.       . erlotinib (TARCEVA) 100 MG tablet Take 1 tablet (100 mg total) by mouth daily. Take on an empty stomach 1 hour before meals or 2 hours after  30 tablet  2  . gabapentin (NEURONTIN) 100 MG capsule TAKE (2)  CAPSULES THREE TIMES DAILY.  180 capsule  1  . lidocaine-prilocaine (EMLA) cream Apply 1 application topically as needed (for port-a-cath access.).       Marland Kitchen olmesartan (BENICAR) 20 MG tablet Take 20 mg by mouth daily.        Marland Kitchen oxyCODONE (OXYCONTIN) 20 MG 12 hr tablet Take 1 tablet (20 mg total) by mouth at bedtime.  30 tablet  0  . polyethylene glycol (MIRALAX / GLYCOLAX) packet Take 17 g by mouth 2 (two) times daily.  14 each    . prochlorperazine (COMPAZINE) 10 MG tablet Take 10 mg by mouth every 6 (six) hours as needed (for nausea.).      Marland Kitchen senna-docusate (SENOKOT-S) 8.6-50 MG per tablet Take 1 tablet by mouth at bedtime.      . temazepam (RESTORIL) 15 MG capsule Take 15 mg by mouth at bedtime as needed for sleep.        No current facility-administered medications for this visit.   Facility-Administered Medications Ordered in Other Visits  Medication Dose Route Frequency Provider Last Rate Last Dose  . influenza  inactive virus vaccine (FLUZONE/FLUARIX) injection 0.5 mL  0.5 mL Intramuscular Once Si Gaul, MD        SURGICAL HISTORY:  Past Surgical History  Procedure Laterality Date  . Cholecystectomy    . Abdominal hysterectomy    . Breast surgery    . Vaginal sling      REVIEW OF SYSTEMS:  A comprehensive review of systems was negative except for: Constitutional: positive for fatigue   PHYSICAL EXAMINATION: General appearance: alert, cooperative and no distress Head: Normocephalic, without obvious abnormality, atraumatic Neck: no adenopathy Lymph nodes: Cervical, supraclavicular, and axillary nodes normal. Resp: clear to auscultation bilaterally Cardio: regular rate and rhythm, S1, S2 normal, no murmur, click, rub or gallop GI: soft, non-tender; bowel sounds normal; no masses,  no organomegaly Extremities: extremities normal, atraumatic, no cyanosis or edema Neurologic: Alert and oriented X 3, normal strength and tone. Normal symmetric reflexes. Normal coordination and  gait Skin:a few scattered acneform eruptions at the posterior neck hairline. No evidence of superinfection  ECOG PERFORMANCE STATUS: 1 - Symptomatic but completely ambulatory  Blood pressure 126/59, pulse 74, temperature 97.9 F (36.6 C), temperature source Oral, resp. rate 20, height 5\' 6"  (1.676 m), weight 135 lb 4.8 oz (61.372 kg).  LABORATORY DATA: Lab Results  Component Value Date   WBC 4.6 01/17/2013   HGB 9.8* 01/17/2013   HCT 27.9* 01/17/2013   MCV 100.6 01/17/2013   PLT 165 01/17/2013      Chemistry      Component Value Date/Time   NA 145 01/17/2013 0930   NA 141 08/18/2012 0450   NA 139 09/02/2011 1406   K 4.2 01/17/2013 0930   K 3.7 08/18/2012 0450   K 3.9 09/02/2011 1406   CL 105 10/02/2012 1056   CL 108 08/18/2012 0450  CL 97* 09/02/2011 1406   CO2 26 01/17/2013 0930   CO2 26 08/18/2012 0450   CO2 29 09/02/2011 1406   BUN 24.4 01/17/2013 0930   BUN 17 08/18/2012 0450   BUN 18 09/02/2011 1406   CREATININE 1.6* 01/17/2013 0930   CREATININE 1.08 08/18/2012 0450   CREATININE 1.2 09/02/2011 1406      Component Value Date/Time   CALCIUM 8.5 01/17/2013 0930   CALCIUM 7.6* 08/18/2012 0450   CALCIUM 8.0 09/02/2011 1406   ALKPHOS 68 01/17/2013 0930   ALKPHOS 97 08/15/2012 0340   ALKPHOS 71 09/02/2011 1406   AST 18 01/17/2013 0930   AST 31 08/15/2012 0340   AST 25 09/02/2011 1406   ALT 8 01/17/2013 0930   ALT 18 08/15/2012 0340   ALT 20 09/02/2011 1406   BILITOT 0.61 01/17/2013 0930   BILITOT 0.4 08/15/2012 0340   BILITOT 0.60 09/02/2011 1406       RADIOGRAPHIC STUDIES: Ct Chest W Contrast  01/01/2013   *RADIOLOGY REPORT*  Clinical Data:  Restaging lung cancer  CT CHEST, ABDOMEN AND PELVIS WITH CONTRAST  Technique:  Multidetector CT imaging of the chest, abdomen and pelvis was performed following the standard protocol during bolus administration of intravenous contrast.  Contrast: OMNIPAQUE IOHEXOL 300 MG/ML  SOLN  Comparison:  09/19/2012    CT CHEST  Findings:  There is no pleural  effusion identified.  Mass within the right upper lobe measures 3.2 cm, image number 17/series 5. This is compared with 2.8 cm previously.  Small nodule within the superior segment of right lower lobe measures 4 mm, image 20/series 5.  This is compared with 3 mm previously the left lung appears clear.  The heart size appears normal.  The no pericardial effusion identified.  No enlarged mediastinal or hilar lymph nodes identified.  There is no axillary or supraclavicular adenopathy.  Peripherally sclerotic manubrial lesion measuring 8 mm is unchanged from prior examinations.  IMPRESSION: 1.  Right upper lobe lung lesion has increased in size from previous exam. 2.  Stable metastasis within the sternal manubrium.    CT ABDOMEN AND PELVIS  Findings:  There is no suspicious liver abnormality identified. Prior cholecystectomy.  Increased caliber of the common bile duct measures 11 mm.  There is mild intrahepatic bile duct dilatation. Normal appearance of the pancreas.  The spleen is remarkable.  The adrenal glands are both normal.  Normal appearance of the right kidney.  The left kidney is also normal.  The urinary bladder is unremarkable.  Calcified atherosclerotic disease affects the abdominal aorta. There is no aneurysm.  No upper abdominal adenopathy identified. There is no pelvic or inguinal adenopathy noted.  No free fluid or abnormal fluid collections within the abdomen or pelvis.  The stomach and the small bowel loops have a normal course and caliber.  No obstruction.  Normal appearance of the proximal colon.  There are scattered distal colonic diverticula without acute inflammation.  Review of the visualized osseous structures shows a peripherally sclerotic lesion involving the L1 vertebra.  This is unchanged from previous exam.  Compression fracture involving the L3 vertebra is again identified.  This has been treated with bone cement and the appearance is unchanged from previous exam.  IMPRESSION: 1.  No  evidence for mass or adenopathy within the abdomen or pelvis.  2.  Stable increased caliber of the common bile duct with mild intrahepatic biliary dilatation.  3.  Stable appearance of sclerotic lesion involving the L1 vertebra.  Original Report Authenticated By: Signa Kell, M.D.    ASSESSMENT AND PLAN: This is a very pleasant 79 years old white female with metastatic non-small cell lung cancer, adenocarcinoma and has been observation since February of 2014 but now has evidence for disease progression in the right upper lobe lung mass. She has resumed therapy with Tarceva at 100 mg by mouth daily starting 01/07/2013. Overall she's tolerating the treatment with Tarceva without difficulty. She is advised to continue using the clindamycin solution on the acneform eruptions on her neck twice daily. The patient was discussed with an also seen by Dr. Arbutus Ped She will continue on Tarceva at 100 mg by mouth daily and return in one month for another symptom management visit with a repeat CBC differential and C. met.  Laural Benes, Brayln Duque E, PA-C   The patient was advised to call immediately if she has any concerning symptoms in the interval.  All questions were answered. The patient knows to call the clinic with any problems, questions or concerns. We can certainly see the patient much sooner if necessary.  ADDENDUM: Hematology/Oncology Attending: I have face to face encounter with the patient today. I recommended her care plan. The patient is tolerating her treatment with Tarceva fairly well with no significant adverse effects except for very mild skin rash but she denied having any significant diarrhea. She denied having any chest pain, shortness breath, cough or hemoptysis. I recommended for her to continue her current treatment with Tarceva 100 mg by mouth daily. The patient would come back for follow up visit in one month's for reevaluation. Lajuana Matte., MD 01/17/2013

## 2013-02-13 ENCOUNTER — Other Ambulatory Visit: Payer: Self-pay | Admitting: *Deleted

## 2013-02-13 DIAGNOSIS — C3490 Malignant neoplasm of unspecified part of unspecified bronchus or lung: Secondary | ICD-10-CM

## 2013-02-13 MED ORDER — OXYCODONE HCL 20 MG PO TB12: 20.0000 mg | ORAL_TABLET | Freq: Every day | ORAL | Status: DC

## 2013-02-14 ENCOUNTER — Ambulatory Visit: Payer: Medicare Other | Admitting: Internal Medicine

## 2013-02-14 ENCOUNTER — Other Ambulatory Visit: Payer: Medicare Other | Admitting: Lab

## 2013-02-14 ENCOUNTER — Telehealth: Payer: Self-pay | Admitting: Internal Medicine

## 2013-02-14 NOTE — Telephone Encounter (Signed)
s.w. pt and r/s appt to 9.3.14.Marland KitchenMarland Kitchenpt ok and aware

## 2013-02-20 ENCOUNTER — Encounter: Payer: Self-pay | Admitting: Internal Medicine

## 2013-02-20 ENCOUNTER — Other Ambulatory Visit (HOSPITAL_BASED_OUTPATIENT_CLINIC_OR_DEPARTMENT_OTHER): Payer: Medicare Other | Admitting: Lab

## 2013-02-20 ENCOUNTER — Telehealth: Payer: Self-pay | Admitting: Internal Medicine

## 2013-02-20 ENCOUNTER — Ambulatory Visit (HOSPITAL_BASED_OUTPATIENT_CLINIC_OR_DEPARTMENT_OTHER): Payer: Medicare Other | Admitting: Internal Medicine

## 2013-02-20 VITALS — BP 149/53 | HR 69 | Temp 98.2°F | Resp 18 | Ht 66.0 in | Wt 134.3 lb

## 2013-02-20 DIAGNOSIS — C7951 Secondary malignant neoplasm of bone: Secondary | ICD-10-CM

## 2013-02-20 DIAGNOSIS — C3491 Malignant neoplasm of unspecified part of right bronchus or lung: Secondary | ICD-10-CM

## 2013-02-20 DIAGNOSIS — C3410 Malignant neoplasm of upper lobe, unspecified bronchus or lung: Secondary | ICD-10-CM

## 2013-02-20 DIAGNOSIS — C3490 Malignant neoplasm of unspecified part of unspecified bronchus or lung: Secondary | ICD-10-CM

## 2013-02-20 DIAGNOSIS — M545 Low back pain: Secondary | ICD-10-CM

## 2013-02-20 LAB — COMPREHENSIVE METABOLIC PANEL (CC13)
ALT: 11 U/L (ref 0–55)
AST: 21 U/L (ref 5–34)
Albumin: 3.3 g/dL — ABNORMAL LOW (ref 3.5–5.0)
Alkaline Phosphatase: 56 U/L (ref 40–150)
Potassium: 4.2 mEq/L (ref 3.5–5.1)
Sodium: 143 mEq/L (ref 136–145)
Total Bilirubin: 0.76 mg/dL (ref 0.20–1.20)
Total Protein: 6.3 g/dL — ABNORMAL LOW (ref 6.4–8.3)

## 2013-02-20 LAB — CBC WITH DIFFERENTIAL/PLATELET
BASO%: 0.5 % (ref 0.0–2.0)
Eosinophils Absolute: 0.1 10*3/uL (ref 0.0–0.5)
LYMPH%: 23.1 % (ref 14.0–49.7)
MCHC: 34.7 g/dL (ref 31.5–36.0)
MCV: 99.8 fL (ref 79.5–101.0)
MONO%: 10.4 % (ref 0.0–14.0)
NEUT#: 2.5 10*3/uL (ref 1.5–6.5)
Platelets: 156 10*3/uL (ref 145–400)
RBC: 2.55 10*6/uL — ABNORMAL LOW (ref 3.70–5.45)
RDW: 13.8 % (ref 11.2–14.5)
WBC: 3.9 10*3/uL (ref 3.9–10.3)

## 2013-02-20 NOTE — Progress Notes (Signed)
[facility] [phone]   [phone]  OFFICE PROGRESS NOTE  [name], MD [address] [place] [address]  DIAGNOSIS: Metastatic non-small cell lung cancer (T2 NX M1). Presented with right upper lobe lung mass as well as multiple pulmonary nodules and bone metastasis at L3 vertebral body. This was diagnosed in March of 2006.   PRIOR THERAPY:  1. Status post palliative radiotherapy to the L3 lesion under the care of Dr. Kathrynn Running completed September 17, 2004. 2. Status post 6 cycles of systemic chemotherapy with carboplatin and paclitaxel. Last dose was given January 01, 2005. 3. Status post palliative radiotherapy to the right upper lobe lung mass under the care of Dr. Kathrynn Running completed on 12/29/2010. 4. Status post treatment with Tarceva 150 mg p.o. daily started April 2007 through July 2012. 5. Tarceva 100 mg p.o. daily started July 2012. 6. Systemic chemotherapy with Carboplatin for AUC 5 and Alimta 500 mg/m2 given every 3 weeks. Status post 6 cycles. 7. Maintenance chemotherapy with single agent Alimta at 500 mg per meter squared given every 3 weeks, status post 1 cycle given on 07/30/2012.  CURRENT THERAPY:  1. Zometa 4 mg IV q. 3 months for bone metastasis. 2. Tarceva 100 mg by mouth daily resumed therapy on 01/07/13  CHEMOTHERAPY INTENT: Palliative  CURRENT # OF CHEMOTHERAPY CYCLES: <1  CURRENT ANTIEMETICS: Compazine  CURRENT SMOKING STATUS: Never Smoker  ORAL CHEMOTHERAPY AND CONSENT: yes  CURRENT BISPHOSPHONATES USE: Zometa every 3 months  PAIN MANAGEMENT: OxyContin  NARCOTICS INDUCED CONSTIPATION: yes but well managed with Senokot-S and as needed Dulcolax  LIVING WILL AND CODE STATUS: Full code   INTERVAL HISTORY: Jamie Munoz 79 y.o. female returns to the clinic today for followup visit. The patient is feeling fine today with no specific complaints except for low back pain. She is currently on OxyContin 20 mg by mouth each  bedtime. The patient denied having any significant chest pain, shortness breath, cough or hemoptysis. She denied having any nausea or vomiting. She is tolerating her treatment was Tarceva fairly well with no significant skin rash or diarrhea.  MEDICAL HISTORY: Past Medical History  Diagnosis Date  . Cancer   . Lung cancer   . Hypertension   . Cellulitis of left lower extremity 07/16/2012    ALLERGIES:  is allergic to codeine.  MEDICATIONS:  Current Outpatient Prescriptions  Medication Sig Dispense Refill  . ALPRAZolam (XANAX) 0.25 MG tablet Take 0.25 mg by mouth 3 (three) times daily as needed for anxiety.       Marland Kitchen aspirin EC 81 MG tablet Take 81 mg by mouth daily.      . bisacodyl (DULCOLAX) 5 MG EC tablet Take 5 mg by mouth daily as needed for constipation.       . clindamycin (CLEOCIN T) 1 % lotion Apply 1 application topically 2 (two) times daily. Applied to face.      . clobetasol ointment (TEMOVATE) 0.05 % Apply 1 application topically daily.      Marland Kitchen docusate sodium (COLACE) 100 MG capsule Take 100 mg by mouth 2 (two) times daily as needed for constipation.       . erlotinib (TARCEVA) 100 MG tablet Take 1 tablet (100 mg total) by mouth daily. Take on an empty stomach 1 hour before meals or 2 hours after  30 tablet  2  . gabapentin (NEURONTIN) 100 MG capsule TAKE (2) CAPSULES THREE TIMES DAILY.  180 capsule  1  .  lidocaine-prilocaine (EMLA) cream Apply 1 application topically as needed (for port-a-cath access.).       Marland Kitchen olmesartan (BENICAR) 20 MG tablet Take 20 mg by mouth daily.        Marland Kitchen oxyCODONE (OXYCONTIN) 20 MG 12 hr tablet Take 1 tablet (20 mg total) by mouth at bedtime.  30 tablet  0  . polyethylene glycol (MIRALAX / GLYCOLAX) packet Take 17 g by mouth 2 (two) times daily.  14 each    . senna-docusate (SENOKOT-S) 8.6-50 MG per tablet Take 1 tablet by mouth at bedtime.      . temazepam (RESTORIL) 15 MG capsule Take 15 mg by mouth at bedtime as needed for sleep.       Marland Kitchen  prochlorperazine (COMPAZINE) 10 MG tablet Take 10 mg by mouth every 6 (six) hours as needed (for nausea.).       No current facility-administered medications for this visit.   Facility-Administered Medications Ordered in Other Visits  Medication Dose Route Frequency Provider Last Rate Last Dose  . influenza  inactive virus vaccine (FLUZONE/FLUARIX) injection 0.5 mL  0.5 mL Intramuscular Once Si Gaul, MD        SURGICAL HISTORY:  Past Surgical History  Procedure Laterality Date  . Cholecystectomy    . Abdominal hysterectomy    . Breast surgery    . Vaginal sling      REVIEW OF SYSTEMS:  A comprehensive review of systems was negative except for: Musculoskeletal: positive for back pain   PHYSICAL EXAMINATION: General appearance: alert, cooperative and no distress Head: Normocephalic, without obvious abnormality, atraumatic Neck: no adenopathy Lymph nodes: Cervical, supraclavicular, and axillary nodes normal. Resp: clear to auscultation bilaterally Back: symmetric, no curvature. ROM normal. No CVA tenderness., Mild tenderness at the lower thoracic spine Cardio: regular rate and rhythm, S1, S2 normal, no murmur, click, rub or gallop GI: soft, non-tender; bowel sounds normal; no masses,  no organomegaly Extremities: extremities normal, atraumatic, no cyanosis or edema  ECOG PERFORMANCE STATUS: 1 - Symptomatic but completely ambulatory  Blood pressure 149/53, pulse 69, temperature 98.2 F (36.8 C), temperature source Oral, resp. rate 18, height 5\' 6"  (1.676 m), weight 134 lb 4.8 oz (60.918 kg), SpO2 99.00%.  LABORATORY DATA: Lab Results  Component Value Date   WBC 3.9 02/20/2013   HGB 8.8* 02/20/2013   HCT 25.5* 02/20/2013   MCV 99.8 02/20/2013   PLT 156 02/20/2013      Chemistry      Component Value Date/Time   NA 143 02/20/2013 1115   NA 141 08/18/2012 0450   NA 139 09/02/2011 1406   K 4.2 02/20/2013 1115   K 3.7 08/18/2012 0450   K 3.9 09/02/2011 1406   CL 105 10/02/2012 1056    CL 108 08/18/2012 0450   CL 97* 09/02/2011 1406   CO2 24 02/20/2013 1115   CO2 26 08/18/2012 0450   CO2 29 09/02/2011 1406   BUN 23.6 02/20/2013 1115   BUN 17 08/18/2012 0450   BUN 18 09/02/2011 1406   CREATININE 1.3* 02/20/2013 1115   CREATININE 1.08 08/18/2012 0450   CREATININE 1.2 09/02/2011 1406      Component Value Date/Time   CALCIUM 8.3* 02/20/2013 1115   CALCIUM 7.6* 08/18/2012 0450   CALCIUM 8.0 09/02/2011 1406   ALKPHOS 56 02/20/2013 1115   ALKPHOS 97 08/15/2012 0340   ALKPHOS 71 09/02/2011 1406   AST 21 02/20/2013 1115   AST 31 08/15/2012 0340   AST 25 09/02/2011 1406  ALT 11 02/20/2013 1115   ALT 18 08/15/2012 0340   ALT 20 09/02/2011 1406   BILITOT 0.76 02/20/2013 1115   BILITOT 0.4 08/15/2012 0340   BILITOT 0.60 09/02/2011 1406       RADIOGRAPHIC STUDIES: No results found.  ASSESSMENT AND PLAN: This is a very pleasant 79 years old white female with metastatic non-small cell lung cancer currently undergoing treatment with oral Tarceva 100 mg by mouth daily, started 6 weeks ago. The patient is doing fine and tolerating her treatment was Tarceva fairly well with no significant adverse effects. The patient had chronic back pain especially at the lower thoracic spine. I recommended for her to and kidneys the dose of OxyContin to 20 mg by mouth every 12 hours. She would come back for followup visit in one month's for reevaluation. She was advised to call immediately she has any concerning symptoms in the interval. The patient voices understanding of current disease status and treatment options and is in agreement with the current care plan.  All questions were answered. The patient knows to call the clinic with any problems, questions or concerns. We can certainly see the patient much sooner if necessary.

## 2013-02-20 NOTE — Telephone Encounter (Signed)
Gave pt appt for 10/3 lab and ML

## 2013-02-20 NOTE — Patient Instructions (Signed)
CURRENT THERAPY:  1. Zometa 4 mg IV q. 3 months for bone metastasis. 2. Tarceva 100 mg by mouth daily resumed therapy on 01/07/13 CHEMOTHERAPY INTENT: Palliative  CURRENT # OF CHEMOTHERAPY CYCLES: <1  CURRENT ANTIEMETICS: Compazine  CURRENT SMOKING STATUS: Never Smoker  ORAL CHEMOTHERAPY AND CONSENT: yes  CURRENT BISPHOSPHONATES USE: Zometa every 3 months  PAIN MANAGEMENT: OxyContin  NARCOTICS INDUCED CONSTIPATION: yes but well managed with Senokot-S and as needed Dulcolax  LIVING WILL AND CODE STATUS: Full code

## 2013-03-05 ENCOUNTER — Other Ambulatory Visit: Payer: Self-pay | Admitting: Internal Medicine

## 2013-03-11 ENCOUNTER — Other Ambulatory Visit: Payer: Self-pay | Admitting: Physician Assistant

## 2013-03-11 DIAGNOSIS — C3490 Malignant neoplasm of unspecified part of unspecified bronchus or lung: Secondary | ICD-10-CM

## 2013-03-22 ENCOUNTER — Ambulatory Visit (HOSPITAL_BASED_OUTPATIENT_CLINIC_OR_DEPARTMENT_OTHER): Payer: Medicare Other | Admitting: Physician Assistant

## 2013-03-22 ENCOUNTER — Ambulatory Visit (HOSPITAL_BASED_OUTPATIENT_CLINIC_OR_DEPARTMENT_OTHER): Payer: Medicare Other

## 2013-03-22 ENCOUNTER — Telehealth: Payer: Self-pay | Admitting: Internal Medicine

## 2013-03-22 ENCOUNTER — Encounter: Payer: Self-pay | Admitting: Physician Assistant

## 2013-03-22 ENCOUNTER — Other Ambulatory Visit (HOSPITAL_BASED_OUTPATIENT_CLINIC_OR_DEPARTMENT_OTHER): Payer: Medicare Other | Admitting: Lab

## 2013-03-22 ENCOUNTER — Telehealth: Payer: Self-pay | Admitting: *Deleted

## 2013-03-22 VITALS — BP 148/70 | HR 73 | Temp 97.1°F | Resp 17 | Ht 66.0 in | Wt 134.8 lb

## 2013-03-22 DIAGNOSIS — C3410 Malignant neoplasm of upper lobe, unspecified bronchus or lung: Secondary | ICD-10-CM

## 2013-03-22 DIAGNOSIS — M545 Low back pain: Secondary | ICD-10-CM

## 2013-03-22 DIAGNOSIS — C7951 Secondary malignant neoplasm of bone: Secondary | ICD-10-CM

## 2013-03-22 DIAGNOSIS — M25559 Pain in unspecified hip: Secondary | ICD-10-CM

## 2013-03-22 DIAGNOSIS — C3491 Malignant neoplasm of unspecified part of right bronchus or lung: Secondary | ICD-10-CM

## 2013-03-22 DIAGNOSIS — C3490 Malignant neoplasm of unspecified part of unspecified bronchus or lung: Secondary | ICD-10-CM

## 2013-03-22 DIAGNOSIS — M310 Hypersensitivity angiitis: Secondary | ICD-10-CM

## 2013-03-22 DIAGNOSIS — Z23 Encounter for immunization: Secondary | ICD-10-CM

## 2013-03-22 LAB — CBC WITH DIFFERENTIAL/PLATELET
BASO%: 0.4 % (ref 0.0–2.0)
EOS%: 0.9 % (ref 0.0–7.0)
Eosinophils Absolute: 0 10*3/uL (ref 0.0–0.5)
LYMPH%: 17.4 % (ref 14.0–49.7)
MCH: 34.8 pg — ABNORMAL HIGH (ref 25.1–34.0)
MCHC: 34.7 g/dL (ref 31.5–36.0)
MCV: 100.3 fL (ref 79.5–101.0)
MONO%: 7.6 % (ref 0.0–14.0)
Platelets: 152 10*3/uL (ref 145–400)
RBC: 2.61 10*6/uL — ABNORMAL LOW (ref 3.70–5.45)
RDW: 13.8 % (ref 11.2–14.5)

## 2013-03-22 LAB — COMPREHENSIVE METABOLIC PANEL (CC13)
AST: 18 U/L (ref 5–34)
Alkaline Phosphatase: 53 U/L (ref 40–150)
Glucose: 98 mg/dl (ref 70–140)
Sodium: 144 mEq/L (ref 136–145)
Total Bilirubin: 0.51 mg/dL (ref 0.20–1.20)
Total Protein: 6.2 g/dL — ABNORMAL LOW (ref 6.4–8.3)

## 2013-03-22 MED ORDER — OXYCODONE-ACETAMINOPHEN 5-325 MG PO TABS: 1.0000 | ORAL_TABLET | ORAL | Status: DC | PRN

## 2013-03-22 MED ORDER — ZOLEDRONIC ACID 4 MG/100ML IV SOLN
4.0000 mg | Freq: Once | INTRAVENOUS | Status: DC
  Filled 2013-03-22: qty 100

## 2013-03-22 MED ORDER — INFLUENZA VAC SPLIT QUAD 0.5 ML IM SUSP
0.5000 mL | Freq: Once | INTRAMUSCULAR | Status: AC
  Administered 2013-03-22: 0.5 mL via INTRAMUSCULAR
  Filled 2013-03-22: qty 0.5

## 2013-03-22 MED ORDER — OXYCODONE HCL ER 20 MG PO T12A
EXTENDED_RELEASE_TABLET | ORAL | Status: DC
Start: 2013-03-22 — End: 2013-04-18

## 2013-03-22 NOTE — Progress Notes (Addendum)
[facility] [phone]   [phone]  SHARED VISIT PROGRESS NOTE  Jamie Hoit, MD [address] [place] [address]  DIAGNOSIS: Metastatic non-small cell lung cancer (T2 NX M1). Presented with right upper lobe lung mass as well as multiple pulmonary nodules and bone metastasis at L3 vertebral body. This was diagnosed in March of 2006.   PRIOR THERAPY:  1. Status post palliative radiotherapy to the L3 lesion under the care of Dr. Kathrynn Running completed September 17, 2004. 2. Status post 6 cycles of systemic chemotherapy with carboplatin and paclitaxel. Last dose was given January 01, 2005. 3. Status post palliative radiotherapy to the right upper lobe lung mass under the care of Dr. Kathrynn Running completed on 12/29/2010. 4. Status post treatment with Tarceva 150 mg p.o. daily started April 2007 through July 2012. 5. Tarceva 100 mg p.o. daily started July 2012. 6. Systemic chemotherapy with Carboplatin for AUC 5 and Alimta 500 mg/m2 given every 3 weeks. Status post 6 cycles. 7. Maintenance chemotherapy with single agent Alimta at 500 mg per meter squared given every 3 weeks, status post 1 cycle given on 07/30/2012.  CURRENT THERAPY:  1. Zometa 4 mg IV q. 3 months for bone metastasis. 2. Tarceva 100 mg by mouth daily resumed therapy on 01/07/13  CHEMOTHERAPY INTENT: Palliative  CURRENT # OF CHEMOTHERAPY CYCLES: 2 CURRENT ANTIEMETICS: Compazine  CURRENT SMOKING STATUS: Never Smoker  ORAL CHEMOTHERAPY AND CONSENT: yes  CURRENT BISPHOSPHONATES USE: Zometa every 3 months  PAIN MANAGEMENT: OxyContin  NARCOTICS INDUCED CONSTIPATION: yes but well managed with Senokot-S and as needed Dulcolax  LIVING WILL AND CODE STATUS: Full code   INTERVAL HISTORY: Jamie Munoz 79 y.o. female returns to the clinic today for followup visit. The patient is feeling fine today with no specific complaints except for increased left hip and low back pain. She denied any numbness  or tingling into her legs or any problems with bowel or bladder control. She is having some difficulty with her vacuuming and some household chores related to the hip and back pain but is able to manage if she takes her time. She is currently on OxyContin 20 mg by mouth each bedtime. She try taking the OxyContin 20 mg twice daily as she was instructed at her last visit, however she felt "too drunk". She has gone back to taking the OxyContin just at bedtime as previously. The patient denied having any significant chest pain, shortness breath, cough or hemoptysis. She denied having any nausea or vomiting. She is tolerating her treatment was Tarceva fairly well with no significant skin rash or diarrhea. She notes some mild skin rash at her posterior hairline /wig line that she successfully uses the clindamycin solution on, but no other significant areas of rash. The patient reports that she is to see her ophthalmologist, Dr. Dione Booze, next week regarding setting up cataract surgery beginning with her right eye. The left eye will be scheduled at a later date.  MEDICAL HISTORY: Past Medical History  Diagnosis Date  . Cancer   . Lung cancer   . Hypertension   . Cellulitis of left lower extremity 07/16/2012    ALLERGIES:  is allergic to codeine.  MEDICATIONS:  Current Outpatient Prescriptions  Medication Sig Dispense Refill  . ALPRAZolam (XANAX) 0.25 MG tablet Take 0.25 mg by mouth 3 (three) times daily as needed for anxiety.       Marland Kitchen aspirin EC 81 MG tablet Take 81 mg by  mouth daily.      . bisacodyl (DULCOLAX) 5 MG EC tablet Take 5 mg by mouth daily as needed for constipation.       . clindamycin (CLEOCIN T) 1 % lotion Apply 1 application topically 2 (two) times daily. Applied to face.      . clobetasol ointment (TEMOVATE) 0.05 % Apply 1 application topically daily.      Marland Kitchen docusate sodium (COLACE) 100 MG capsule Take 100 mg by mouth 2 (two) times daily as needed for constipation.       . erlotinib  (TARCEVA) 100 MG tablet Take 1 tablet (100 mg total) by mouth daily. Take on an empty stomach 1 hour before meals or 2 hours after  30 tablet  2  . gabapentin (NEURONTIN) 100 MG capsule TAKE (2) CAPSULES THREE TIMES DAILY.  180 capsule  1  . lidocaine-prilocaine (EMLA) cream Apply 1 application topically as needed (for port-a-cath access.).       Marland Kitchen olmesartan (BENICAR) 20 MG tablet Take 20 mg by mouth daily.        Marland Kitchen oxyCODONE (OXYCONTIN) 20 MG 12 hr tablet Take 1 tablet (20 mg total) by mouth at bedtime.  30 tablet  0  . OxyCODONE (OXYCONTIN) 20 mg T12A 12 hr tablet Take 1 tablet by mouth at bedtime  30 tablet  0  . oxyCODONE-acetaminophen (ROXICET) 5-325 MG per tablet Take 1 tablet by mouth every 4 (four) hours as needed for pain.  30 tablet  0  . polyethylene glycol (MIRALAX / GLYCOLAX) packet Take 17 g by mouth 2 (two) times daily.  14 each    . prochlorperazine (COMPAZINE) 10 MG tablet Take 10 mg by mouth every 6 (six) hours as needed (for nausea.).      Marland Kitchen senna-docusate (SENOKOT-S) 8.6-50 MG per tablet Take 1 tablet by mouth at bedtime.      . temazepam (RESTORIL) 15 MG capsule TAKE 1 CAPSULE AT BEDTIME AS NEEDED FOR INSOMNIA  30 capsule  0   Current Facility-Administered Medications  Medication Dose Route Frequency Provider Last Rate Last Dose  . [START ON 04/05/2013] Zoledronic Acid (ZOMETA) 4 mg IVPB  4 mg Intravenous Once Keimon Basaldua E TRUE Shackleford, PA-C       Facility-Administered Medications Ordered in Other Visits  Medication Dose Route Frequency Provider Last Rate Last Dose  . influenza  inactive virus vaccine (FLUZONE/FLUARIX) injection 0.5 mL  0.5 mL Intramuscular Once Si Gaul, MD        SURGICAL HISTORY:  Past Surgical History  Procedure Laterality Date  . Cholecystectomy    . Abdominal hysterectomy    . Breast surgery    . Vaginal sling      REVIEW OF SYSTEMS:  A comprehensive review of systems was negative except for: Musculoskeletal: positive for back pain and left hip  pain   PHYSICAL EXAMINATION: General appearance: alert, cooperative and no distress Head: Normocephalic, without obvious abnormality, atraumatic Neck: no adenopathy Lymph nodes: Cervical, supraclavicular, and axillary nodes normal. Resp: clear to auscultation bilaterally Back: symmetric, no curvature. ROM normal. No CVA tenderness., Mild tenderness at the lower thoracic spine Cardio: regular rate and rhythm, S1, S2 normal, no murmur, click, rub or gallop GI: soft, non-tender; bowel sounds normal; no masses,  no organomegaly Extremities: extremities normal, atraumatic, no cyanosis or edema  ECOG PERFORMANCE STATUS: 1 - Symptomatic but completely ambulatory  Blood pressure 148/70, pulse 73, temperature 97.1 F (36.2 C), temperature source Oral, resp. rate 17, height 5\' 6"  (1.676 m), weight  134 lb 12.8 oz (61.145 kg), SpO2 97.00%.  LABORATORY DATA: Lab Results  Component Value Date   WBC 4.3 03/22/2013   HGB 9.1* 03/22/2013   HCT 26.1* 03/22/2013   MCV 100.3 03/22/2013   PLT 152 03/22/2013      Chemistry      Component Value Date/Time   NA 144 03/22/2013 0957   NA 141 08/18/2012 0450   NA 139 09/02/2011 1406   K 3.9 03/22/2013 0957   K 3.7 08/18/2012 0450   K 3.9 09/02/2011 1406   CL 105 10/02/2012 1056   CL 108 08/18/2012 0450   CL 97* 09/02/2011 1406   CO2 27 03/22/2013 0957   CO2 26 08/18/2012 0450   CO2 29 09/02/2011 1406   BUN 26.3* 03/22/2013 0957   BUN 17 08/18/2012 0450   BUN 18 09/02/2011 1406   CREATININE 1.2* 03/22/2013 0957   CREATININE 1.08 08/18/2012 0450   CREATININE 1.2 09/02/2011 1406      Component Value Date/Time   CALCIUM 8.4 03/22/2013 0957   CALCIUM 7.6* 08/18/2012 0450   CALCIUM 8.0 09/02/2011 1406   ALKPHOS 53 03/22/2013 0957   ALKPHOS 97 08/15/2012 0340   ALKPHOS 71 09/02/2011 1406   AST 18 03/22/2013 0957   AST 31 08/15/2012 0340   AST 25 09/02/2011 1406   ALT 11 03/22/2013 0957   ALT 18 08/15/2012 0340   ALT 20 09/02/2011 1406   BILITOT 0.51 03/22/2013 0957   BILITOT 0.4  08/15/2012 0340   BILITOT 0.60 09/02/2011 1406       RADIOGRAPHIC STUDIES: No results found.  ASSESSMENT AND PLAN: This is a very pleasant 79 years old white female with metastatic non-small cell lung cancer currently undergoing treatment with oral Tarceva 100 mg by mouth daily, status post approximately 2-2 and a half months of therapy. The patient is doing fine and tolerating her treatment was Tarceva fairly well with no significant adverse effects. Patient was discussed with him also seen by Dr. Arbutus Ped. She did not tolerate going back up to OxyContin 20 mg by mouth every 12 hours. She will continue taking the OxyContin at bedtime and she was given a refill for the OxyContin as well as a prescription for Percocet 5/325 mg that she can take as needed for breakthrough pain. She was given a prescription for 30 tablets of both the OxyContin and the Percocet. Both prescriptions were with no refills. She will be due for her next Zometa infusion on 04/05/2013 and will return in 2 weeks for that. She requested flu vaccine today and she will be given a flu vaccine today as requested. She'll followup with Dr. Arbutus Ped in one month with a repeat CBC differential, C. met and CT of the chest, abdomen and pelvis with contrast to reevaluate her disease.  Laural Benes, Patryce Depriest E, PA-C   She was advised to call immediately she has any concerning symptoms in the interval. The patient voices understanding of current disease status and treatment options and is in agreement with the current care plan.  All questions were answered. The patient knows to call the clinic with any problems, questions or concerns. We can certainly see the patient much sooner if necessary.  ADDENDUM: Hematology/Oncology Attending: I had a face to face encounter with the patient. I recommended her care plan. This is a very pleasant 79 years old white female with metastatic non-small cell lung cancer diagnosed in March 2006 and has been on  treatment with several chemotherapy regimen including almost 3  years of Tarceva and she is currently on Tarceva 100 mg by mouth daily and tolerating it fairly well with no significant skin rash or diarrhea. The patient has mild back pain and she is currently on OxyContin 20 mg each bedtime but she does not like to take it in the morning. We'll give her a prescription for Percocet to be used on as-needed basis on the daytime. The patient would come back for follow up visit in one month with repeat CT scan of the chest, abdomen and pelvis for restaging of her disease. She was advised to call immediately if she has any concerning symptoms in the interval. Lajuana Matte., MD 03/24/2013

## 2013-03-22 NOTE — Telephone Encounter (Signed)
s.w. pt and advised on 10.17 and 10.29.14 appts...pt ok and aware

## 2013-03-22 NOTE — Patient Instructions (Addendum)
Return in 2 weeks for your Zometa infusion Continue taking your OxyContin 20 mg at bedtime and you may take Percocet 5/325- 1 tablet by mouth every 4-6 hours as needed for break through pain  Follow up with Dr. Arbutus Ped in 1 month with a restaging CT scan of your chest, abdomen and pelvis to re-evaluate your disease

## 2013-03-22 NOTE — Telephone Encounter (Signed)
Per staff message and POF I have scheduled appts.  JMW  

## 2013-03-22 NOTE — Telephone Encounter (Signed)
gv andprinted appt sched and avs forpt for OCT..emailed MW to add tx....gv pt barium  °

## 2013-03-23 IMAGING — CT CT ABD-PELV W/ CM
2 of 6 series · 16 of 46 positions shown, 18 images · IV contrast (omnipaque)
Comparison: None.

CT CHEST

CLINICAL DATA: History of lung cancer.  Status post radiation
therapy.  Currently undergoing chemotherapy.

CT CHEST, ABDOMEN AND PELVIS WITH CONTRAST
TECHNIQUE: Multidetector CT imaging of the chest, abdomen and
pelvis was performed following the standard protocol during bolus
administration of intravenous contrast.
Contrast: 100mL OMNIPAQUE IOHEXOL 300 MG/ML  SOLN

[Series 2: rtn a/p with · axial · 0.68mm/px · z∈[-570,-80]mm · 13 of 114 slices shown, 15 images]
[im 8/114  soft-tissue]
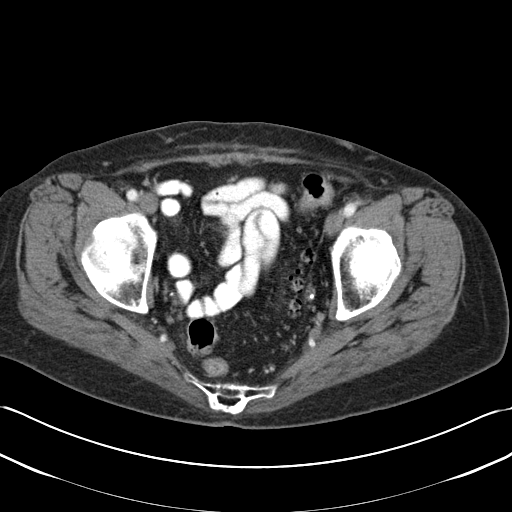
[im 8/114  bone]
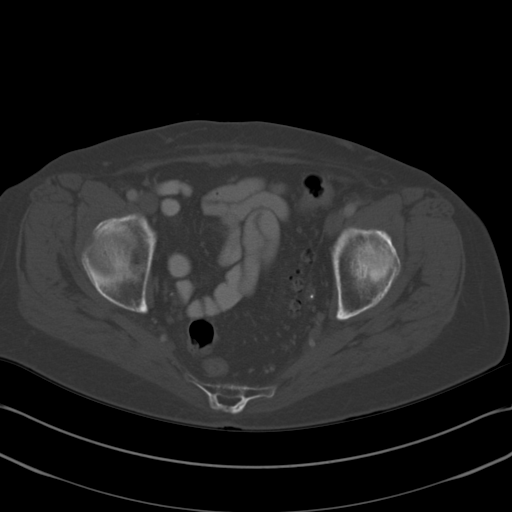
[im 16/114  soft-tissue]
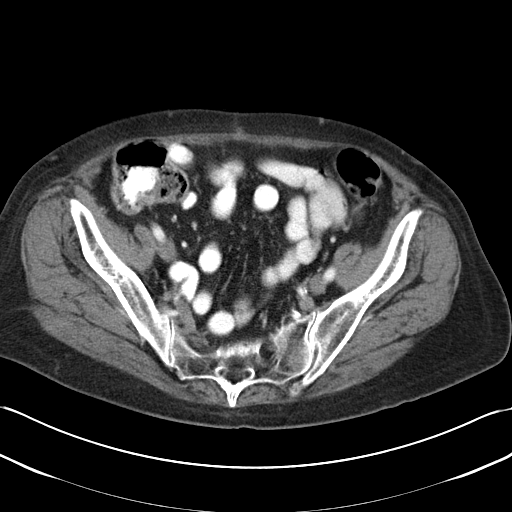
[im 23/114  soft-tissue]
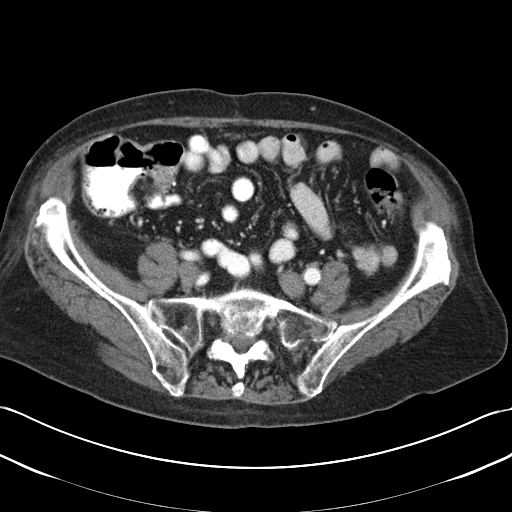
[im 31/114  soft-tissue]
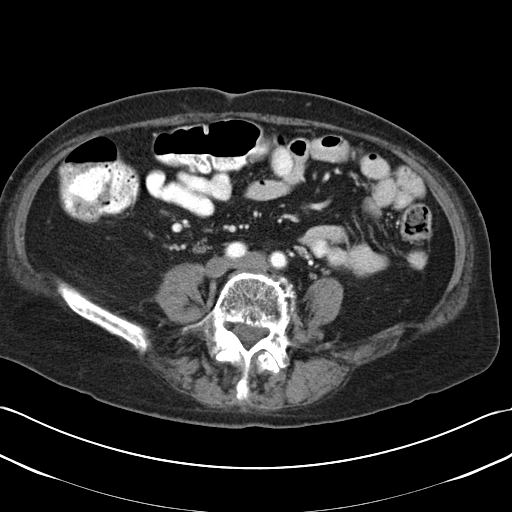
[im 38/114  soft-tissue]
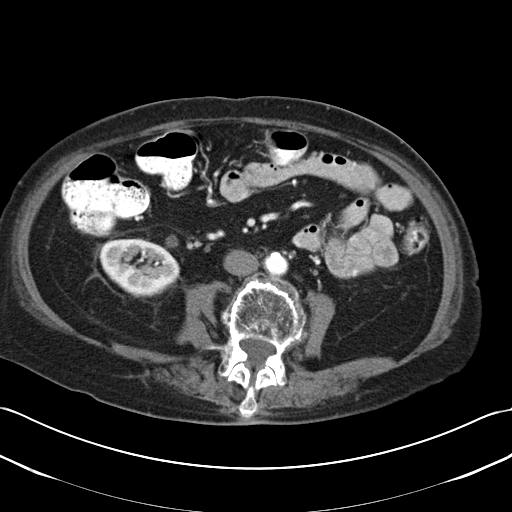
[im 46/114  soft-tissue]
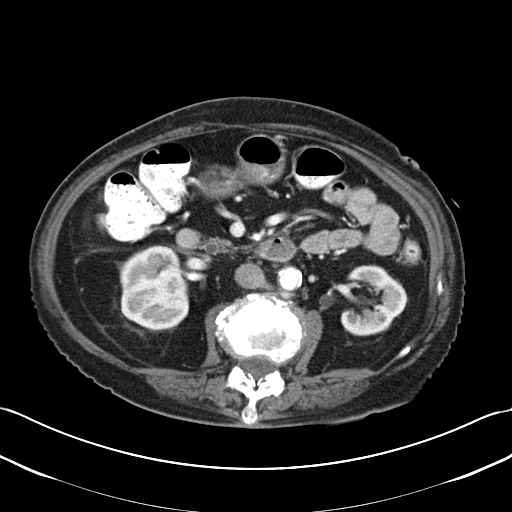
[im 61/114  soft-tissue]
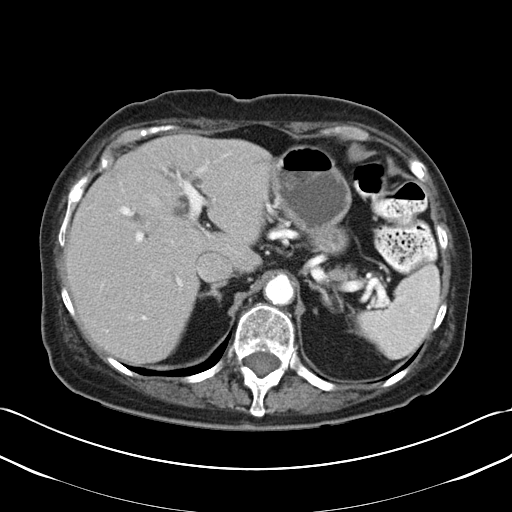
[im 68/114  soft-tissue]
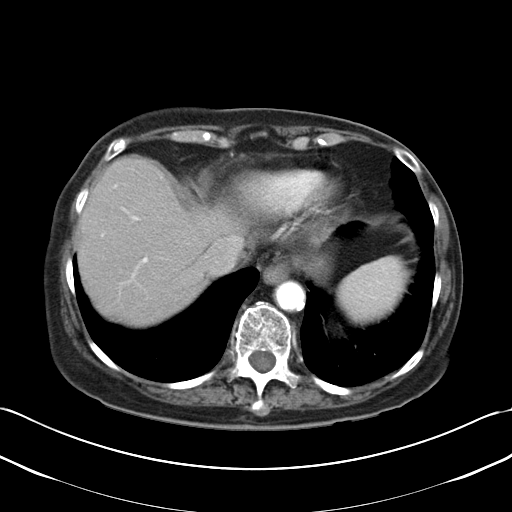
[im 76/114  soft-tissue]
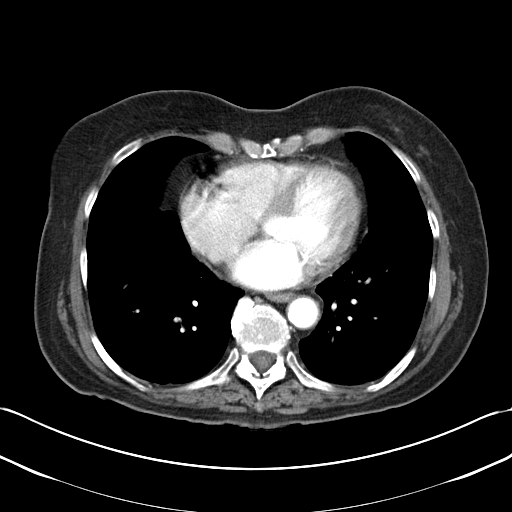
[im 76/114  bone]
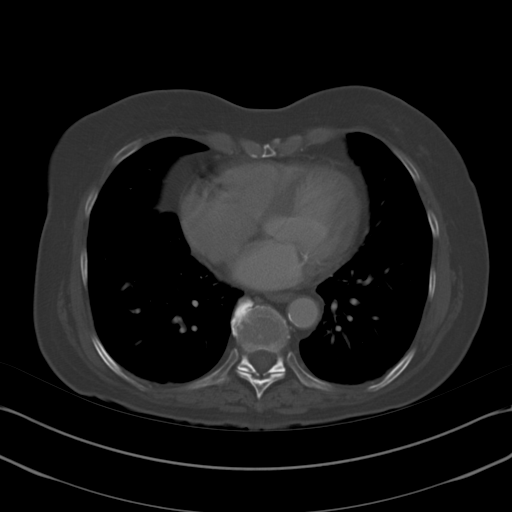
[im 83/114  soft-tissue]
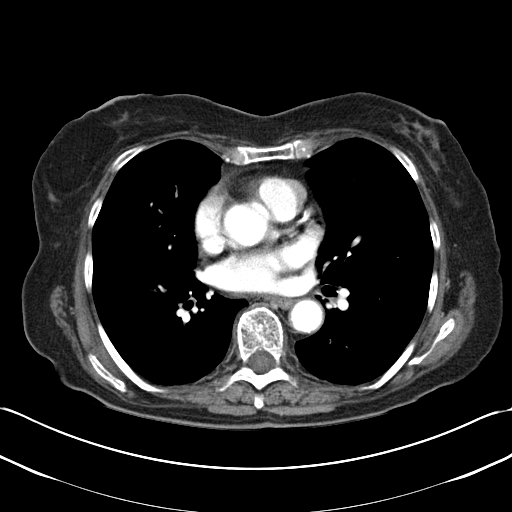
[im 91/114  soft-tissue]
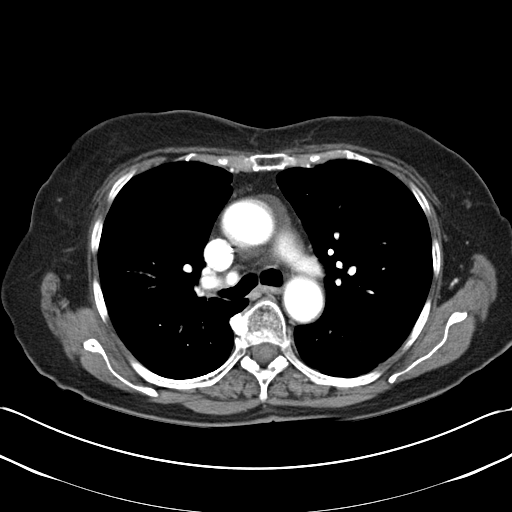
[im 98/114  soft-tissue]
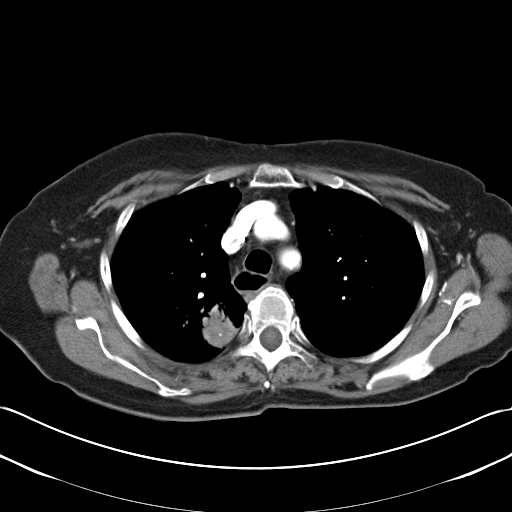
[im 106/114  soft-tissue]
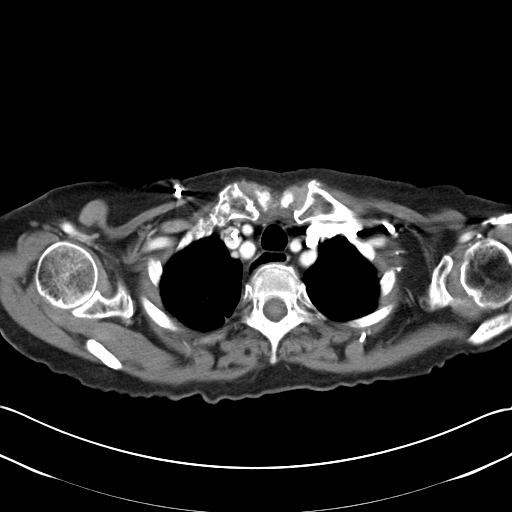

[Series 602: <mpr thick range> · coronal · 1.11mm/px · 3 of 77 slices shown]
[im 34/77  soft-tissue]
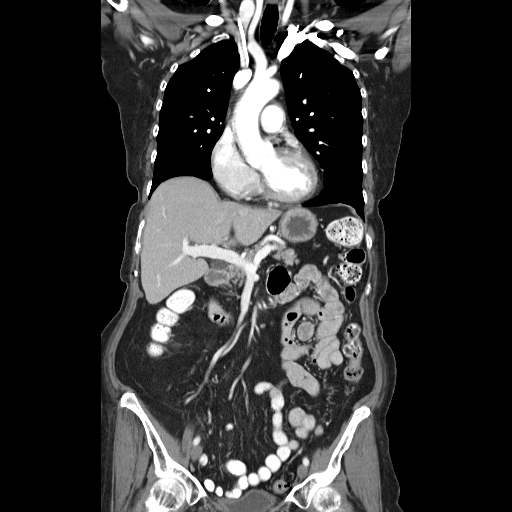
[im 43/77  soft-tissue]
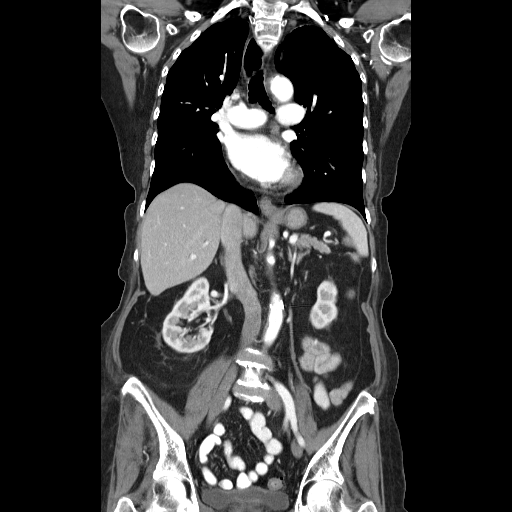
[im 51/77  soft-tissue]
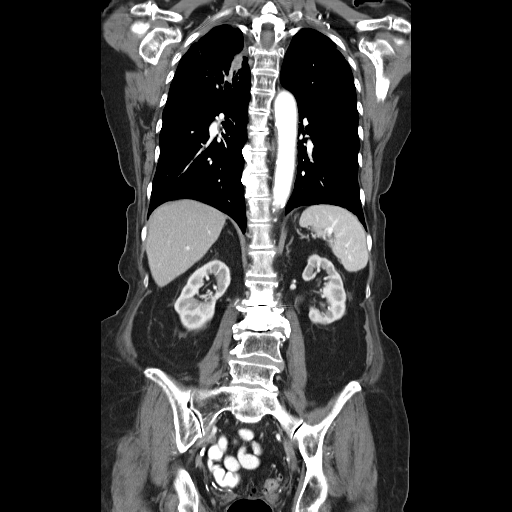

[16 of 46 positions shown; findings below may reference images not displayed]

FINDINGS: Right paratracheal node on image 21 measures 1.1 cm
short axis dimension compared to 1.4 cm on the most recent
examination.  No new or enlarging lymph nodes are identified in the
hila, axilla or mediastinum.  There is no pleural or pericardial
effusion.  Heart size is mildly enlarged. The upper esophagus
appears patulous with a short air fluid level present within it.
The appearance is unchanged. Metastatic deposit in the manubrium is
again seen.

Lungs again demonstrate a bilobed lesion in the right upper lobe.
The more superior, anterior component measures 1.2 cm on image 15
in diameter compared to 1.8 cm.  The more inferior component with
calcifications and areas of low attenuation measures 2.2 x 1.9 cm
on image 17 compared to 3.2 x 2.9 cm.  No new or enlarging
pulmonary nodule or mass is identified.  No consolidative process
is seen.]
IMPRESSION: [1.  Interval decrease in the size of a right upper lobe mass.
Right paratracheal lymph nodes also decreased in size.
2.  No new abnormality.

CT ABDOMEN AND PELVIS
FINDINGS: The patient is status post cholecystectomy and
hysterectomy.  Mild intra and extrahepatic biliary ductal
prominence is unchanged.  There is no focal liver lesion.  The
adrenal glands, spleen, pancreas and kidneys are unremarkable.
Retroaortic left renal vein is noted.  No lymphadenopathy or fluid
is identified.  Diverticulosis without diverticulitis is seen.  The
colon is otherwise unremarkable.  The stomach and small bowel
appear normal.  There is no lymphadenopathy or fluid.  Bones again
demonstrate an L3 compression fracture where the patient is status
post vertebral augmentation. L1 metastatic deposit and compression
fracture deformity of L3 where the patient is status post vertebral
augmentation again seen.
IMPRESSION: L1 and L3 metastases are unchanged.  No new metastatic disease
identified.  The appearance of the abdomen and pelvis is stable.

## 2013-04-04 ENCOUNTER — Other Ambulatory Visit: Payer: Self-pay | Admitting: Internal Medicine

## 2013-04-05 ENCOUNTER — Ambulatory Visit (HOSPITAL_BASED_OUTPATIENT_CLINIC_OR_DEPARTMENT_OTHER): Payer: Medicare Other

## 2013-04-05 VITALS — BP 130/61 | HR 71 | Temp 96.9°F | Resp 20

## 2013-04-05 DIAGNOSIS — C7951 Secondary malignant neoplasm of bone: Secondary | ICD-10-CM

## 2013-04-05 DIAGNOSIS — C3491 Malignant neoplasm of unspecified part of right bronchus or lung: Secondary | ICD-10-CM

## 2013-04-05 DIAGNOSIS — C3410 Malignant neoplasm of upper lobe, unspecified bronchus or lung: Secondary | ICD-10-CM

## 2013-04-05 MED ORDER — ZOLEDRONIC ACID 4 MG/5ML IV CONC
4.0000 mg | Freq: Once | INTRAVENOUS | Status: AC
  Administered 2013-04-05: 4 mg via INTRAVENOUS
  Filled 2013-04-05: qty 5

## 2013-04-05 MED ORDER — SODIUM CHLORIDE 0.9 % IV SOLN
Freq: Once | INTRAVENOUS | Status: AC
  Administered 2013-04-05: 13:00:00 via INTRAVENOUS

## 2013-04-05 MED ORDER — HEPARIN SOD (PORK) LOCK FLUSH 100 UNIT/ML IV SOLN
500.0000 [IU] | Freq: Once | INTRAVENOUS | Status: AC | PRN
  Administered 2013-04-05: 500 [IU]
  Filled 2013-04-05: qty 5

## 2013-04-05 MED ORDER — SODIUM CHLORIDE 0.9 % IJ SOLN
10.0000 mL | INTRAMUSCULAR | Status: DC | PRN
  Administered 2013-04-05: 10 mL
  Filled 2013-04-05: qty 10

## 2013-04-05 NOTE — Patient Instructions (Signed)
Zoledronic Acid injection (Hypercalcemia, Oncology) What is this medicine? ZOLEDRONIC ACID (ZOE le dron ik AS id) lowers the amount of calcium loss from bone. It is used to treat too much calcium in your blood from cancer. It is also used to prevent complications of cancer that has spread to the bone. This medicine may be used for other purposes; ask your health care provider or pharmacist if you have questions. What should I tell my health care provider before I take this medicine? They need to know if you have any of these conditions: -aspirin-sensitive asthma -dental disease -kidney disease -an unusual or allergic reaction to zoledronic acid, other medicines, foods, dyes, or preservatives -pregnant or trying to get pregnant -breast-feeding How should I use this medicine? This medicine is for infusion into a vein. It is given by a health care professional in a hospital or clinic setting. Talk to your pediatrician regarding the use of this medicine in children. Special care may be needed. Overdosage: If you think you have taken too much of this medicine contact a poison control center or emergency room at once. NOTE: This medicine is only for you. Do not share this medicine with others. What if I miss a dose? It is important not to miss your dose. Call your doctor or health care professional if you are unable to keep an appointment. What may interact with this medicine? -certain antibiotics given by injection -NSAIDs, medicines for pain and inflammation, like ibuprofen or naproxen -some diuretics like bumetanide, furosemide -teriparatide -thalidomide This list may not describe all possible interactions. Give your health care provider a list of all the medicines, herbs, non-prescription drugs, or dietary supplements you use. Also tell them if you smoke, drink alcohol, or use illegal drugs. Some items may interact with your medicine. What should I watch for while using this medicine? Visit  your doctor or health care professional for regular checkups. It may be some time before you see the benefit from this medicine. Do not stop taking your medicine unless your doctor tells you to. Your doctor may order blood tests or other tests to see how you are doing. Women should inform their doctor if they wish to become pregnant or think they might be pregnant. There is a potential for serious side effects to an unborn child. Talk to your health care professional or pharmacist for more information. You should make sure that you get enough calcium and vitamin D while you are taking this medicine. Discuss the foods you eat and the vitamins you take with your health care professional. Some people who take this medicine have severe bone, joint, and/or muscle pain. This medicine may also increase your risk for a broken thigh bone. Tell your doctor right away if you have pain in your upper leg or groin. Tell your doctor if you have any pain that does not go away or that gets worse. What side effects may I notice from receiving this medicine? Side effects that you should report to your doctor or health care professional as soon as possible: -allergic reactions like skin rash, itching or hives, swelling of the face, lips, or tongue -anxiety, confusion, or depression -breathing problems -changes in vision -feeling faint or lightheaded, falls -jaw burning, cramping, pain -muscle cramps, stiffness, or weakness -trouble passing urine or change in the amount of urine Side effects that usually do not require medical attention (report to your doctor or health care professional if they continue or are bothersome): -bone, joint, or muscle pain -  fever -hair loss -irritation at site where injected -loss of appetite -nausea, vomiting -stomach upset -tired This list may not describe all possible side effects. Call your doctor for medical advice about side effects. You may report side effects to FDA at  1-800-FDA-1088. Where should I keep my medicine? This drug is given in a hospital or clinic and will not be stored at home. NOTE: This sheet is a summary. It may not cover all possible information. If you have questions about this medicine, talk to your doctor, pharmacist, or health care provider.  2012, [employer]. (12/03/2010 9:06:58 AM) 

## 2013-04-17 ENCOUNTER — Ambulatory Visit (HOSPITAL_COMMUNITY)
Admission: RE | Admit: 2013-04-17 | Discharge: 2013-04-17 | Disposition: A | Discharge status: Home / Self Care | Payer: Medicare Other | Source: Ambulatory Visit | Attending: Physician Assistant | Admitting: Physician Assistant

## 2013-04-17 ENCOUNTER — Encounter (HOSPITAL_COMMUNITY): Payer: Self-pay

## 2013-04-17 DIAGNOSIS — C7951 Secondary malignant neoplasm of bone: Secondary | ICD-10-CM | POA: Insufficient documentation

## 2013-04-17 DIAGNOSIS — C3490 Malignant neoplasm of unspecified part of unspecified bronchus or lung: Secondary | ICD-10-CM

## 2013-04-17 DIAGNOSIS — K5730 Diverticulosis of large intestine without perforation or abscess without bleeding: Secondary | ICD-10-CM | POA: Insufficient documentation

## 2013-04-17 MED ORDER — IOHEXOL 300 MG/ML  SOLN
100.0000 mL | Freq: Once | INTRAMUSCULAR | Status: AC | PRN
  Administered 2013-04-17: 100 mL via INTRAVENOUS

## 2013-04-18 ENCOUNTER — Encounter (INDEPENDENT_AMBULATORY_CARE_PROVIDER_SITE_OTHER): Payer: Self-pay

## 2013-04-18 ENCOUNTER — Telehealth: Payer: Self-pay | Admitting: *Deleted

## 2013-04-18 ENCOUNTER — Other Ambulatory Visit (HOSPITAL_BASED_OUTPATIENT_CLINIC_OR_DEPARTMENT_OTHER): Payer: Medicare Other | Admitting: Lab

## 2013-04-18 ENCOUNTER — Ambulatory Visit (HOSPITAL_BASED_OUTPATIENT_CLINIC_OR_DEPARTMENT_OTHER): Payer: Medicare Other | Admitting: Internal Medicine

## 2013-04-18 ENCOUNTER — Encounter: Payer: Self-pay | Admitting: Internal Medicine

## 2013-04-18 DIAGNOSIS — R5381 Other malaise: Secondary | ICD-10-CM

## 2013-04-18 DIAGNOSIS — C3491 Malignant neoplasm of unspecified part of right bronchus or lung: Secondary | ICD-10-CM

## 2013-04-18 DIAGNOSIS — M549 Dorsalgia, unspecified: Secondary | ICD-10-CM

## 2013-04-18 DIAGNOSIS — D649 Anemia, unspecified: Secondary | ICD-10-CM

## 2013-04-18 DIAGNOSIS — C3410 Malignant neoplasm of upper lobe, unspecified bronchus or lung: Secondary | ICD-10-CM

## 2013-04-18 DIAGNOSIS — C7951 Secondary malignant neoplasm of bone: Secondary | ICD-10-CM

## 2013-04-18 DIAGNOSIS — C3490 Malignant neoplasm of unspecified part of unspecified bronchus or lung: Secondary | ICD-10-CM

## 2013-04-18 LAB — COMPREHENSIVE METABOLIC PANEL (CC13)
AST: 18 U/L (ref 5–34)
Anion Gap: 9 mEq/L (ref 3–11)
BUN: 26.9 mg/dL — ABNORMAL HIGH (ref 7.0–26.0)
Calcium: 8.6 mg/dL (ref 8.4–10.4)
Chloride: 108 mEq/L (ref 98–109)
Creatinine: 1.4 mg/dL — ABNORMAL HIGH (ref 0.6–1.1)
Glucose: 82 mg/dl (ref 70–140)
Potassium: 4.3 mEq/L (ref 3.5–5.1)
Sodium: 141 mEq/L (ref 136–145)
Total Protein: 6.4 g/dL (ref 6.4–8.3)

## 2013-04-18 LAB — CBC WITH DIFFERENTIAL/PLATELET
Basophils Absolute: 0 10*3/uL (ref 0.0–0.1)
EOS%: 1 % (ref 0.0–7.0)
Eosinophils Absolute: 0.1 10*3/uL (ref 0.0–0.5)
HCT: 26.9 % — ABNORMAL LOW (ref 34.8–46.6)
HGB: 8.9 g/dL — ABNORMAL LOW (ref 11.6–15.9)
MCH: 33.2 pg (ref 25.1–34.0)
MCV: 100.4 fL (ref 79.5–101.0)
NEUT%: 77.6 % — ABNORMAL HIGH (ref 38.4–76.8)
RDW: 13.4 % (ref 11.2–14.5)
lymph#: 0.9 10*3/uL (ref 0.9–3.3)

## 2013-04-18 NOTE — Telephone Encounter (Signed)
appts made and printed...td 

## 2013-04-18 NOTE — Progress Notes (Signed)
[facility] [phone]   [phone]  OFFICE PROGRESS NOTE  [name], MD [address] [place] [address]  DIAGNOSIS: Metastatic non-small cell lung cancer (T2 NX M1). Presented with right upper lobe lung mass as well as multiple pulmonary nodules and bone metastasis at L3 vertebral body. This was diagnosed in March of 2006.   PRIOR THERAPY:  1. Status post palliative radiotherapy to the L3 lesion under the care of Dr. Kathrynn Running completed September 17, 2004. 2. Status post 6 cycles of systemic chemotherapy with carboplatin and paclitaxel. Last dose was given January 01, 2005. 3. Status post palliative radiotherapy to the right upper lobe lung mass under the care of Dr. Kathrynn Running completed on 12/29/2010. 4. Status post treatment with Tarceva 150 mg p.o. daily started April 2007 through July 2012. 5. Tarceva 100 mg p.o. daily started July 2012. 6. Systemic chemotherapy with Carboplatin for AUC 5 and Alimta 500 mg/m2 given every 3 weeks. Status post 6 cycles. 7. Maintenance chemotherapy with single agent Alimta at 500 mg per meter squared given every 3 weeks, status post 1 cycle given on 07/30/2012.  CURRENT THERAPY:  1. Zometa 4 mg IV q. 3 months for bone metastasis. 2. Tarceva 100 mg by mouth daily resumed therapy on 01/07/13  CHEMOTHERAPY INTENT: Palliative  CURRENT # OF CHEMOTHERAPY CYCLES: 3  CURRENT ANTIEMETICS: Compazine  CURRENT SMOKING STATUS: Never Smoker  ORAL CHEMOTHERAPY AND CONSENT: yes  CURRENT BISPHOSPHONATES USE: Zometa every 3 months  PAIN MANAGEMENT: OxyContin  NARCOTICS INDUCED CONSTIPATION: yes but well managed with Senokot-S and as needed Dulcolax  LIVING WILL AND CODE STATUS: Full code  INTERVAL HISTORY: Jamie Munoz 79 y.o. female returns to the clinic today for followup visit. The patient is feeling fine today with no specific complaints. She is tolerating her current treatment with Tarceva fairly well with no  significant adverse effects. The patient denied having any skin rash or diarrhea. She continues to have mild fatigue and itching on the back but no significant back pain. The patient denied having any chest pain, shortness of breath, cough or hemoptysis. She lost a few pounds recently. She has repeat CT scan of the chest, abdomen and pelvis performed recently and she is here for evaluation and discussion of her scan results.  MEDICAL HISTORY: Past Medical History  Diagnosis Date  . Cancer   . Lung cancer   . Hypertension   . Cellulitis of left lower extremity 07/16/2012    ALLERGIES:  is allergic to codeine.  MEDICATIONS:  Current Outpatient Prescriptions  Medication Sig Dispense Refill  . ALPRAZolam (XANAX) 0.25 MG tablet Take 0.25 mg by mouth 3 (three) times daily as needed for anxiety.       Marland Kitchen aspirin EC 81 MG tablet Take 81 mg by mouth daily.      . clindamycin (CLEOCIN T) 1 % lotion Apply 1 application topically 2 (two) times daily. Applied to face.      . clobetasol ointment (TEMOVATE) 0.05 % Apply 1 application topically daily.      Marland Kitchen docusate sodium (COLACE) 100 MG capsule Take 100 mg by mouth 2 (two) times daily as needed for constipation.       . gabapentin (NEURONTIN) 100 MG capsule TAKE (2) CAPSULES THREE TIMES DAILY.  180 capsule  1  . lidocaine-prilocaine (EMLA) cream Apply 1 application topically as needed (for port-a-cath access.).       Marland Kitchen olmesartan (BENICAR) 20 MG tablet  Take 20 mg by mouth daily.        Marland Kitchen oxyCODONE (OXYCONTIN) 20 MG 12 hr tablet Take 1 tablet (20 mg total) by mouth at bedtime.  30 tablet  0  . polyethylene glycol (MIRALAX / GLYCOLAX) packet Take 17 g by mouth 2 (two) times daily.  14 each    . TARCEVA 100 MG tablet TAKE 1 TABLET BY MOUTH DAILY. TAKE ON AN EMPTY STOMACH 1 HOUR BEFORE MEALS OR 2 HOURS AFTER  30 tablet  2  . bisacodyl (DULCOLAX) 5 MG EC tablet Take 5 mg by mouth daily as needed for constipation.       . prochlorperazine (COMPAZINE) 10 MG  tablet Take 10 mg by mouth every 6 (six) hours as needed (for nausea.).      Marland Kitchen senna-docusate (SENOKOT-S) 8.6-50 MG per tablet Take 1 tablet by mouth at bedtime.      . temazepam (RESTORIL) 15 MG capsule TAKE 1 CAPSULE AT BEDTIME AS NEEDED FOR INSOMNIA  30 capsule  0   No current facility-administered medications for this visit.   Facility-Administered Medications Ordered in Other Visits  Medication Dose Route Frequency Provider Last Rate Last Dose  . influenza  inactive virus vaccine (FLUZONE/FLUARIX) injection 0.5 mL  0.5 mL Intramuscular Once Si Gaul, MD        SURGICAL HISTORY:  Past Surgical History  Procedure Laterality Date  . Cholecystectomy    . Abdominal hysterectomy    . Breast surgery    . Vaginal sling      REVIEW OF SYSTEMS:  Constitutional: positive for fatigue Eyes: negative Ears, nose, mouth, throat, and face: negative Respiratory: negative Cardiovascular: negative Gastrointestinal: negative Genitourinary:negative Integument/breast: negative Hematologic/lymphatic: negative Musculoskeletal:negative Neurological: negative Behavioral/Psych: negative Endocrine: negative Allergic/Immunologic: negative   PHYSICAL EXAMINATION: General appearance: alert, cooperative and no distress Head: Normocephalic, without obvious abnormality, atraumatic Neck: no adenopathy, no JVD, supple, symmetrical, trachea midline and thyroid not enlarged, symmetric, no tenderness/mass/nodules Lymph nodes: Cervical, supraclavicular, and axillary nodes normal. Resp: clear to auscultation bilaterally Back: symmetric, no curvature. ROM normal. No CVA tenderness. Cardio: regular rate and rhythm, S1, S2 normal, no murmur, click, rub or gallop GI: soft, non-tender; bowel sounds normal; no masses,  no organomegaly Extremities: extremities normal, atraumatic, no cyanosis or edema Neurologic: Alert and oriented X 3, normal strength and tone. Normal symmetric reflexes. Normal coordination  and gait  ECOG PERFORMANCE STATUS: 1 - Symptomatic but completely ambulatory  Blood pressure 138/45, pulse 74, temperature 97.4 F (36.3 C), temperature source Oral, resp. rate 19, height 5\' 6"  (1.676 m), weight 131 lb 12.8 oz (59.784 kg).  LABORATORY DATA: Lab Results  Component Value Date   WBC 6.1 04/18/2013   HGB 8.9* 04/18/2013   HCT 26.9* 04/18/2013   MCV 100.4 04/18/2013   PLT 190 04/18/2013      Chemistry      Component Value Date/Time   NA 144 03/22/2013 0957   NA 141 08/18/2012 0450   NA 139 09/02/2011 1406   K 3.9 03/22/2013 0957   K 3.7 08/18/2012 0450   K 3.9 09/02/2011 1406   CL 105 10/02/2012 1056   CL 108 08/18/2012 0450   CL 97* 09/02/2011 1406   CO2 27 03/22/2013 0957   CO2 26 08/18/2012 0450   CO2 29 09/02/2011 1406   BUN 26.3* 03/22/2013 0957   BUN 17 08/18/2012 0450   BUN 18 09/02/2011 1406   CREATININE 1.2* 03/22/2013 0957   CREATININE 1.08 08/18/2012 0450   CREATININE  1.2 09/02/2011 1406      Component Value Date/Time   CALCIUM 8.4 03/22/2013 0957   CALCIUM 7.6* 08/18/2012 0450   CALCIUM 8.0 09/02/2011 1406   ALKPHOS 53 03/22/2013 0957   ALKPHOS 97 08/15/2012 0340   ALKPHOS 71 09/02/2011 1406   AST 18 03/22/2013 0957   AST 31 08/15/2012 0340   AST 25 09/02/2011 1406   ALT 11 03/22/2013 0957   ALT 18 08/15/2012 0340   ALT 20 09/02/2011 1406   BILITOT 0.51 03/22/2013 0957   BILITOT 0.4 08/15/2012 0340   BILITOT 0.60 09/02/2011 1406       RADIOGRAPHIC STUDIES: Ct Chest W Contrast  04/18/2013   ADDENDUM REPORT: 04/18/2013 08:21  ADDENDUM: Correction to exam title shown below, which should read:  CT CHEST WITH CONTRAST  CT ABDOMEN AND PELVIS WITH CONTRAST   Electronically Signed   By: Myles Rosenthal M.D.   On: 04/18/2013 08:21   04/18/2013   CLINICAL DATA:  Followup non-small cell lung carcinoma.  EXAM: CT CHEST WITH CONTRAST  CT ABDOMEN AND PELVIS WITH AND WITHOUT CONTRAST  TECHNIQUE: Multidetector CT imaging of the chest was performed during intravenous contrast  administration. Multidetector CT imaging of the abdomen and pelvis was performed following the standard protocol before and during bolus administration of intravenous contrast.  CONTRAST:  OMNIPAQUE IOHEXOL 300 MG/ML  SOLN  COMPARISON:  01/01/2013  FINDINGS:   CT CHEST FINDINGS  Mass in the posterior medial right upper lobe measures 3.2 x 3.6 cm on image 20 of series 4, without significant change in size compared to prior exam no other pulmonary nodules or masses are identified. No evidence of hilar or mediastinal lymphadenopathy. No evidence of pleural or pericardial effusion.  Small mixed lytic and sclerotic bone lesion in the sternal manubrium measuring approximately 9 mm is stable. No evidence of chest wall mass.    CT ABDOMEN AND PELVIS FINDINGS  Gallbladder is absent. Biliary dilatation remains stable. No liver masses are identified. The adrenal glands and pancreas are normal in appearance. Both kidneys are also normal in appearance, without evidence of mass or hydronephrosis. Prior hysterectomy noted. Adnexal regions are unremarkable.  No soft tissue masses or lymphadenopathy identified within the abdomen or pelvis. Duodenal noted. No evidence of inflammatory process or abnormal fluid collections. No evidence of dilated bowel loops or hernia. Diverticulosis is seen involving the sigmoid colon, however there is no evidence of diverticulitis. Mixed lytic and sclerotic bone lesion in the L1 vertebral body remains stable, as well as old L3 vertebral body compression fracture and vertebroplasty.    IMPRESSION: Stable 3.6 cm posterior right upper lobe mass.  Stable bone metastases involving the sternal manubrium and L1 vertebral body.  No evidence of new or progressive metastatic disease.  Electronically Signed: By: Myles Rosenthal M.D. On: 04/17/2013 11:45   ASSESSMENT AND PLAN:  This is a very pleasant 79 years old white female with metastatic non-small cell lung cancer diagnosed in March 2006 and has  been on treatment with several chemotherapy regimen including almost 3 years of Tarceva and she is currently on Tarceva 100 mg by mouth daily and tolerating it fairly well with no significant skin rash or diarrhea.  Repeat CT scan of the chest, abdomen and pelvis showed no evidence for disease progression. I discussed the scan results with the patient and recommended for her to continue treatment was Tarceva with the same dose. For the back pain, she is currently on OxyContin 20 mg each bedtime.  For the anemia, I will start the patient on Integra plus 1 capsule by mouth daily. She was advised to call immediately if she has any concerning symptoms in the interval  The patient voices understanding of current disease status and treatment options and is in agreement with the current care plan.  All questions were answered. The patient knows to call the clinic with any problems, questions or concerns. We can certainly see the patient much sooner if necessary.

## 2013-04-22 ENCOUNTER — Other Ambulatory Visit: Payer: Self-pay | Admitting: *Deleted

## 2013-04-22 DIAGNOSIS — C3490 Malignant neoplasm of unspecified part of unspecified bronchus or lung: Secondary | ICD-10-CM

## 2013-04-22 MED ORDER — OXYCODONE HCL ER 20 MG PO T12A: 20.0000 mg | EXTENDED_RELEASE_TABLET | Freq: Every day | ORAL | Status: DC

## 2013-04-22 NOTE — Telephone Encounter (Signed)
rx mailed to pt home per pt request.  SLJ

## 2013-05-10 ENCOUNTER — Other Ambulatory Visit: Payer: Self-pay | Admitting: Internal Medicine

## 2013-05-20 ENCOUNTER — Encounter (INDEPENDENT_AMBULATORY_CARE_PROVIDER_SITE_OTHER): Payer: Self-pay

## 2013-05-20 ENCOUNTER — Encounter: Payer: Self-pay | Admitting: Physician Assistant

## 2013-05-20 ENCOUNTER — Ambulatory Visit (HOSPITAL_BASED_OUTPATIENT_CLINIC_OR_DEPARTMENT_OTHER): Payer: Medicare Other | Admitting: Physician Assistant

## 2013-05-20 ENCOUNTER — Telehealth: Payer: Self-pay | Admitting: *Deleted

## 2013-05-20 ENCOUNTER — Other Ambulatory Visit (HOSPITAL_BASED_OUTPATIENT_CLINIC_OR_DEPARTMENT_OTHER): Payer: Medicare Other | Admitting: Lab

## 2013-05-20 VITALS — BP 154/66 | HR 70 | Temp 97.7°F | Resp 18 | Ht 66.0 in | Wt 130.9 lb

## 2013-05-20 DIAGNOSIS — C7951 Secondary malignant neoplasm of bone: Secondary | ICD-10-CM

## 2013-05-20 DIAGNOSIS — C3410 Malignant neoplasm of upper lobe, unspecified bronchus or lung: Secondary | ICD-10-CM

## 2013-05-20 DIAGNOSIS — D649 Anemia, unspecified: Secondary | ICD-10-CM

## 2013-05-20 DIAGNOSIS — C3490 Malignant neoplasm of unspecified part of unspecified bronchus or lung: Secondary | ICD-10-CM

## 2013-05-20 LAB — COMPREHENSIVE METABOLIC PANEL (CC13)
ALT: 13 U/L (ref 0–55)
AST: 20 U/L (ref 5–34)
Albumin: 3.5 g/dL (ref 3.5–5.0)
Alkaline Phosphatase: 45 U/L (ref 40–150)
Anion Gap: 9 meq/L (ref 3–11)
BUN: 26.5 mg/dL — ABNORMAL HIGH (ref 7.0–26.0)
CO2: 25 meq/L (ref 22–29)
Calcium: 8.6 mg/dL (ref 8.4–10.4)
Chloride: 109 meq/L (ref 98–109)
Creatinine: 1.4 mg/dL — ABNORMAL HIGH (ref 0.6–1.1)
Glucose: 91 mg/dL (ref 70–140)
Potassium: 4.2 meq/L (ref 3.5–5.1)
Sodium: 143 meq/L (ref 136–145)
Total Bilirubin: 0.64 mg/dL (ref 0.20–1.20)
Total Protein: 6.4 g/dL (ref 6.4–8.3)

## 2013-05-20 LAB — CBC WITH DIFFERENTIAL/PLATELET
BASO%: 0.4 % (ref 0.0–2.0)
Basophils Absolute: 0 10*3/uL (ref 0.0–0.1)
EOS%: 0.9 % (ref 0.0–7.0)
Eosinophils Absolute: 0 10*3/uL (ref 0.0–0.5)
HGB: 9.4 g/dL — ABNORMAL LOW (ref 11.6–15.9)
MCHC: 33.3 g/dL (ref 31.5–36.0)
MONO#: 0.4 10*3/uL (ref 0.1–0.9)
NEUT#: 3.6 10*3/uL (ref 1.5–6.5)
Platelets: 162 10*3/uL (ref 145–400)
RBC: 2.76 10*6/uL — ABNORMAL LOW (ref 3.70–5.45)
RDW: 13.3 % (ref 11.2–14.5)
WBC: 4.9 10*3/uL (ref 3.9–10.3)
lymph#: 0.8 10*3/uL — ABNORMAL LOW (ref 0.9–3.3)

## 2013-05-20 NOTE — Telephone Encounter (Signed)
appts made and printed...td 

## 2013-05-20 NOTE — Progress Notes (Addendum)
[facility] [phone]) [phone]   Fax:(336) [phone]  SHARED VISIT PROGRESS NOTE  [name], MD [address] [place] [address]  DIAGNOSIS: Metastatic non-small cell lung cancer (T2 NX M1). Presented with right upper lobe lung mass as well as multiple pulmonary nodules and bone metastasis at L3 vertebral body. This was diagnosed in March of 2006.   PRIOR THERAPY:  1. Status post palliative radiotherapy to the L3 lesion under the care of Dr. Kathrynn Running completed September 17, 2004. 2. Status post 6 cycles of systemic chemotherapy with carboplatin and paclitaxel. Last dose was given January 01, 2005. 3. Status post palliative radiotherapy to the right upper lobe lung mass under the care of Dr. Kathrynn Running completed on 12/29/2010. 4. Status post treatment with Tarceva 150 mg p.o. daily started April 2007 through July 2012. 5. Tarceva 100 mg p.o. daily started July 2012. 6. Systemic chemotherapy with Carboplatin for AUC 5 and Alimta 500 mg/m2 given every 3 weeks. Status post 6 cycles. 7. Maintenance chemotherapy with single agent Alimta at 500 mg per meter squared given every 3 weeks, status post 1 cycle given on 07/30/2012.  CURRENT THERAPY:  1. Zometa 4 mg IV q. 3 months for bone metastasis. 2. Tarceva 100 mg by mouth daily resumed therapy on 01/07/13  CHEMOTHERAPY INTENT: Palliative  CURRENT # OF CHEMOTHERAPY CYCLES: 3  CURRENT ANTIEMETICS: Compazine  CURRENT SMOKING STATUS: Never Smoker  ORAL CHEMOTHERAPY AND CONSENT: yes  CURRENT BISPHOSPHONATES USE: Zometa every 3 months  PAIN MANAGEMENT: OxyContin  NARCOTICS INDUCED CONSTIPATION: yes but well managed with Senokot-S and as needed Dulcolax  LIVING WILL AND CODE STATUS: Full code  INTERVAL HISTORY: Jamie Munoz 79 y.o. female returns to the clinic today for followup visit. The patient is feeling fine today with no specific complaints. She is tolerating her current treatment with Tarceva fairly well with no  significant adverse effects. The patient denied having any skin rash or diarrhea. She continues to have mild fatigue back pain. Her pain is well-controlled with her OxyContin. She will need a new prescription for her OxyContin before her next appointment. The patient denied having any chest pain, shortness of breath, cough or hemoptysis. She reports she had an enjoyable Thanksgiving with her family. Her weight is stable.  MEDICAL HISTORY: Past Medical History  Diagnosis Date  . Cancer   . Lung cancer   . Hypertension   . Cellulitis of left lower extremity 07/16/2012    ALLERGIES:  is allergic to codeine.  MEDICATIONS:  Current Outpatient Prescriptions  Medication Sig Dispense Refill  . ALPRAZolam (XANAX) 0.25 MG tablet Take 0.25 mg by mouth 3 (three) times daily as needed for anxiety.       Marland Kitchen aspirin EC 81 MG tablet Take 81 mg by mouth daily.      Marland Kitchen BESIVANCE 0.6 % SUSP       . bisacodyl (DULCOLAX) 5 MG EC tablet Take 5 mg by mouth daily as needed for constipation.       . clindamycin (CLEOCIN T) 1 % lotion Apply 1 application topically 2 (two) times daily. Applied to face.      . docusate sodium (COLACE) 100 MG capsule Take 100 mg by mouth 2 (two) times daily as needed for constipation.       . DUREZOL 0.05 % EMUL       . gabapentin (NEURONTIN) 100 MG capsule TAKE (2) CAPSULES THREE TIMES DAILY.  180 capsule  1  .  olmesartan (BENICAR) 20 MG tablet Take 20 mg by mouth daily.        . OxyCODONE (OXYCONTIN) 20 mg T12A 12 hr tablet Take 1 tablet (20 mg total) by mouth at bedtime.  60 tablet  0  . oxyCODONE-acetaminophen (PERCOCET/ROXICET) 5-325 MG per tablet       . polyethylene glycol (MIRALAX / GLYCOLAX) packet Take 17 g by mouth 2 (two) times daily.  14 each    . prochlorperazine (COMPAZINE) 10 MG tablet Take 10 mg by mouth every 6 (six) hours as needed (for nausea.).      Marland Kitchen senna-docusate (SENOKOT-S) 8.6-50 MG per tablet Take 1 tablet by mouth at bedtime.      Marland Kitchen TARCEVA 100 MG tablet  TAKE 1 TABLET BY MOUTH DAILY. TAKE ON AN EMPTY STOMACH 1 HOUR BEFORE MEALS OR 2 HOURS AFTER  30 tablet  2  . temazepam (RESTORIL) 15 MG capsule TAKE 1 CAPSULE AT BEDTIME AS NEEDED FOR INSOMNIA  30 capsule  0  . clobetasol ointment (TEMOVATE) 0.05 % Apply 1 application topically daily.      Marland Kitchen lidocaine-prilocaine (EMLA) cream Apply 1 application topically as needed (for port-a-cath access.).        No current facility-administered medications for this visit.   Facility-Administered Medications Ordered in Other Visits  Medication Dose Route Frequency Provider Last Rate Last Dose  . influenza  inactive virus vaccine (FLUZONE/FLUARIX) injection 0.5 mL  0.5 mL Intramuscular Once Si Gaul, MD        SURGICAL HISTORY:  Past Surgical History  Procedure Laterality Date  . Cholecystectomy    . Abdominal hysterectomy    . Breast surgery    . Vaginal sling      REVIEW OF SYSTEMS:  Constitutional: positive for fatigue Eyes: negative Ears, nose, mouth, throat, and face: negative Respiratory: negative Cardiovascular: negative Gastrointestinal: negative Genitourinary:negative Integument/breast: negative Hematologic/lymphatic: negative Musculoskeletal:positive for back pain Neurological: negative Behavioral/Psych: negative Endocrine: negative Allergic/Immunologic: negative   PHYSICAL EXAMINATION: General appearance: alert, cooperative and no distress Head: Normocephalic, without obvious abnormality, atraumatic Neck: no adenopathy, no JVD, supple, symmetrical, trachea midline and thyroid not enlarged, symmetric, no tenderness/mass/nodules Lymph nodes: Cervical, supraclavicular, and axillary nodes normal. Resp: clear to auscultation bilaterally Back: symmetric, no curvature. ROM normal. No CVA tenderness. Cardio: regular rate and rhythm, S1, S2 normal, no murmur, click, rub or gallop GI: soft, non-tender; bowel sounds normal; no masses,  no organomegaly Extremities: extremities  normal, atraumatic, no cyanosis or edema Neurologic: Alert and oriented X 3, normal strength and tone. Normal symmetric reflexes. Normal coordination and gait  ECOG PERFORMANCE STATUS: 1 - Symptomatic but completely ambulatory  Blood pressure 154/66, pulse 70, temperature 97.7 F (36.5 C), temperature source Oral, resp. rate 18, height 5\' 6"  (1.676 m), weight 130 lb 14.4 oz (59.376 kg), SpO2 96.00%.  LABORATORY DATA: Lab Results  Component Value Date   WBC 4.9 05/20/2013   HGB 9.4* 05/20/2013   HCT 28.3* 05/20/2013   MCV 102.5* 05/20/2013   PLT 162 05/20/2013      Chemistry      Component Value Date/Time   NA 143 05/20/2013 0950   NA 141 08/18/2012 0450   NA 139 09/02/2011 1406   K 4.2 05/20/2013 0950   K 3.7 08/18/2012 0450   K 3.9 09/02/2011 1406   CL 105 10/02/2012 1056   CL 108 08/18/2012 0450   CL 97* 09/02/2011 1406   CO2 25 05/20/2013 0950   CO2 26 08/18/2012 0450   CO2  29 09/02/2011 1406   BUN 26.5* 05/20/2013 0950   BUN 17 08/18/2012 0450   BUN 18 09/02/2011 1406   CREATININE 1.4* 05/20/2013 0950   CREATININE 1.08 08/18/2012 0450   CREATININE 1.2 09/02/2011 1406      Component Value Date/Time   CALCIUM 8.6 05/20/2013 0950   CALCIUM 7.6* 08/18/2012 0450   CALCIUM 8.0 09/02/2011 1406   ALKPHOS 45 05/20/2013 0950   ALKPHOS 97 08/15/2012 0340   ALKPHOS 71 09/02/2011 1406   AST 20 05/20/2013 0950   AST 31 08/15/2012 0340   AST 25 09/02/2011 1406   ALT 13 05/20/2013 0950   ALT 18 08/15/2012 0340   ALT 20 09/02/2011 1406   BILITOT 0.64 05/20/2013 0950   BILITOT 0.4 08/15/2012 0340   BILITOT 0.60 09/02/2011 1406       RADIOGRAPHIC STUDIES: Ct Chest W Contrast  04/18/2013   ADDENDUM REPORT: 04/18/2013 08:21  ADDENDUM: Correction to exam title shown below, which should read:  CT CHEST WITH CONTRAST  CT ABDOMEN AND PELVIS WITH CONTRAST   Electronically Signed   By: Myles Rosenthal M.D.   On: 04/18/2013 08:21   04/18/2013   CLINICAL DATA:  Followup non-small cell lung carcinoma.  EXAM: CT CHEST WITH  CONTRAST  CT ABDOMEN AND PELVIS WITH AND WITHOUT CONTRAST  TECHNIQUE: Multidetector CT imaging of the chest was performed during intravenous contrast administration. Multidetector CT imaging of the abdomen and pelvis was performed following the standard protocol before and during bolus administration of intravenous contrast.  CONTRAST:  OMNIPAQUE IOHEXOL 300 MG/ML  SOLN  COMPARISON:  01/01/2013  FINDINGS:   CT CHEST FINDINGS  Mass in the posterior medial right upper lobe measures 3.2 x 3.6 cm on image 20 of series 4, without significant change in size compared to prior exam no other pulmonary nodules or masses are identified. No evidence of hilar or mediastinal lymphadenopathy. No evidence of pleural or pericardial effusion.  Small mixed lytic and sclerotic bone lesion in the sternal manubrium measuring approximately 9 mm is stable. No evidence of chest wall mass.    CT ABDOMEN AND PELVIS FINDINGS  Gallbladder is absent. Biliary dilatation remains stable. No liver masses are identified. The adrenal glands and pancreas are normal in appearance. Both kidneys are also normal in appearance, without evidence of mass or hydronephrosis. Prior hysterectomy noted. Adnexal regions are unremarkable.  No soft tissue masses or lymphadenopathy identified within the abdomen or pelvis. Duodenal noted. No evidence of inflammatory process or abnormal fluid collections. No evidence of dilated bowel loops or hernia. Diverticulosis is seen involving the sigmoid colon, however there is no evidence of diverticulitis. Mixed lytic and sclerotic bone lesion in the L1 vertebral body remains stable, as well as old L3 vertebral body compression fracture and vertebroplasty.    IMPRESSION: Stable 3.6 cm posterior right upper lobe mass.  Stable bone metastases involving the sternal manubrium and L1 vertebral body.  No evidence of new or progressive metastatic disease.  Electronically Signed: By: Myles Rosenthal M.D. On: 04/17/2013 11:45    ASSESSMENT AND PLAN:  This is a very pleasant 79 years old white female with metastatic non-small cell lung cancer diagnosed in March 2006 and has been on treatment with several chemotherapy regimen including almost 3 years of Tarceva and she is currently on Tarceva 100 mg by mouth daily and tolerating it fairly well with no significant skin rash or diarrhea.  Her recent CT scan of the chest, abdomen and pelvis showed no evidence  for disease progression. Patient was discussed with him also seen by Dr. Arbutus Ped. She will continue on Tarceva at 100 mg by mouth daily. She was given a prescription for OxyContin 20 mg tablets a total of 60 not to be filled before 06/11/2013. She will followup in 4 weeks for another symptom management visit with a repeat CBC differential and C. met. She is to continue on Integra +,1 capsule daily for anemia.  She was advised to call immediately if she has any concerning symptoms in the interval  Gery Sabedra E, PA-C   The patient voices understanding of current disease status and treatment options and is in agreement with the current care plan.  All questions were answered. The patient knows to call the clinic with any problems, questions or concerns. We can certainly see the patient much sooner if necessary.  ADDENDUM: Hematology/Oncology Attending: I had face to face encounter with the patient. I recommended her care plan.This is a very pleasant 79 years old white female with recurrent non-small cell lung cancer, adenocarcinoma currently undergoing systemic treatment with oral Tarceva 100 mg by mouth daily and tolerating it fairly well. She did not have any evidence for disease progression on the most recent scan. I recommended for the patient to continue her current treatment with Tarceva as scheduled. For pain management she will continue on OxyContin and for anemia the patient will continue on Integra plus. She would come back for follow up visit in one month's  for reevaluation and management any adverse effect of her treatment. The patient was advised to call immediately if she has any concerning symptoms in the interval.  Disclaimer: This note was dictated with voice recognition software. Similar sounding words can inadvertently be transcribed and may not be corrected upon review.   Lajuana Matte., MD 07/20/2013

## 2013-05-21 NOTE — Patient Instructions (Signed)
Continue Tarceva 100 mg by mouth daily Continued Integra plus, 1 capsule by mouth daily Followup in 4 weeks for another symptom management visit

## 2013-05-30 ENCOUNTER — Other Ambulatory Visit: Payer: Self-pay | Admitting: Physician Assistant

## 2013-05-30 DIAGNOSIS — C3490 Malignant neoplasm of unspecified part of unspecified bronchus or lung: Secondary | ICD-10-CM

## 2013-06-15 ENCOUNTER — Other Ambulatory Visit: Payer: Self-pay | Admitting: Internal Medicine

## 2013-06-17 ENCOUNTER — Telehealth: Payer: Self-pay | Admitting: Internal Medicine

## 2013-06-17 ENCOUNTER — Other Ambulatory Visit: Payer: Medicare Other | Admitting: Lab

## 2013-06-17 ENCOUNTER — Telehealth: Payer: Self-pay | Admitting: *Deleted

## 2013-06-17 ENCOUNTER — Other Ambulatory Visit (HOSPITAL_BASED_OUTPATIENT_CLINIC_OR_DEPARTMENT_OTHER): Payer: Medicare Other

## 2013-06-17 ENCOUNTER — Ambulatory Visit (HOSPITAL_BASED_OUTPATIENT_CLINIC_OR_DEPARTMENT_OTHER): Payer: Medicare Other | Admitting: Internal Medicine

## 2013-06-17 ENCOUNTER — Encounter: Payer: Self-pay | Admitting: Internal Medicine

## 2013-06-17 ENCOUNTER — Ambulatory Visit (HOSPITAL_BASED_OUTPATIENT_CLINIC_OR_DEPARTMENT_OTHER): Payer: Medicare Other

## 2013-06-17 VITALS — Ht 66.0 in | Wt 134.4 lb

## 2013-06-17 DIAGNOSIS — C3410 Malignant neoplasm of upper lobe, unspecified bronchus or lung: Secondary | ICD-10-CM

## 2013-06-17 DIAGNOSIS — C7951 Secondary malignant neoplasm of bone: Secondary | ICD-10-CM

## 2013-06-17 DIAGNOSIS — I10 Essential (primary) hypertension: Secondary | ICD-10-CM

## 2013-06-17 DIAGNOSIS — C3490 Malignant neoplasm of unspecified part of unspecified bronchus or lung: Secondary | ICD-10-CM

## 2013-06-17 LAB — CBC WITH DIFFERENTIAL/PLATELET
BASO%: 0.4 % (ref 0.0–2.0)
Basophils Absolute: 0 10*3/uL (ref 0.0–0.1)
Eosinophils Absolute: 0.1 10*3/uL (ref 0.0–0.5)
LYMPH%: 16 % (ref 14.0–49.7)
MCHC: 34.5 g/dL (ref 31.5–36.0)
MCV: 101.2 fL — ABNORMAL HIGH (ref 79.5–101.0)
MONO#: 0.4 10*3/uL (ref 0.1–0.9)
MONO%: 8.5 % (ref 0.0–14.0)
NEUT#: 3.6 10*3/uL (ref 1.5–6.5)
Platelets: 168 10*3/uL (ref 145–400)
RBC: 2.59 10*6/uL — ABNORMAL LOW (ref 3.70–5.45)
RDW: 14 % (ref 11.2–14.5)
WBC: 4.9 10*3/uL (ref 3.9–10.3)

## 2013-06-17 LAB — COMPREHENSIVE METABOLIC PANEL (CC13)
ALT: 11 U/L (ref 0–55)
Albumin: 3.2 g/dL — ABNORMAL LOW (ref 3.5–5.0)
Alkaline Phosphatase: 43 U/L (ref 40–150)
BUN: 23 mg/dL (ref 7.0–26.0)
Glucose: 114 mg/dl (ref 70–140)
Potassium: 4.3 mEq/L (ref 3.5–5.1)
Sodium: 142 mEq/L (ref 136–145)
Total Bilirubin: 0.48 mg/dL (ref 0.20–1.20)
Total Protein: 6 g/dL — ABNORMAL LOW (ref 6.4–8.3)

## 2013-06-17 MED ORDER — SODIUM CHLORIDE 0.9 % IJ SOLN
10.0000 mL | INTRAMUSCULAR | Status: DC | PRN
  Administered 2013-06-17: 10 mL via INTRAVENOUS
  Filled 2013-06-17: qty 10

## 2013-06-17 MED ORDER — HEPARIN SOD (PORK) LOCK FLUSH 100 UNIT/ML IV SOLN
500.0000 [IU] | Freq: Once | INTRAVENOUS | Status: AC
  Administered 2013-06-17: 500 [IU] via INTRAVENOUS
  Filled 2013-06-17: qty 5

## 2013-06-17 NOTE — Telephone Encounter (Signed)
gv and printed appt sched and avs for pt for Jan thru March 2015.....sed added tx. °

## 2013-06-17 NOTE — Patient Instructions (Signed)
Implanted Port Instructions  An implanted port is a central line that has a round shape and is placed under the skin. It is used for long-term IV (intravenous) access for:  · Medicine.  · Fluids.  · Liquid nutrition, such as TPN (total parenteral nutrition).  · Blood samples.  Ports can be placed:  · In the chest area just below the collarbone (this is the most common place.)  · In the arms.  · In the belly (abdomen) area.  · In the legs.  PARTS OF THE PORT  A port has 2 main parts:  · The reservoir. The reservoir is round, disc-shaped, and will be a small, raised area under your skin.  · The reservoir is the part where a needle is inserted (accessed) to either give medicines or to draw blood.  · The catheter. The catheter is a long, slender tube that extends from the reservoir. The catheter is placed into a large vein.  · Medicine that is inserted into the reservoir goes into the catheter and then into the vein.  INSERTION OF THE PORT  · The port is surgically placed in either an operating room or in a procedural area (interventional radiology).  · Medicine may be given to help you relax during the procedure.  · The skin where the port will be inserted is numbed (local anesthetic).  · 1 or 2 small cuts (incisions) will be made in the skin to insert the port.  · The port can be used after it has been inserted.  INCISION SITE CARE  · The incision site may have small adhesive strips on it. This helps keep the incision site closed. Sometimes, no adhesive strips are placed. Instead of adhesive strips, a special kind of surgical glue is used to keep the incision closed.  · If adhesive strips were placed on the incision sites, do not take them off. They will fall off on their own.  · The incision site may be sore for 1 to 2 days. Pain medicine can help.  · Do not get the incision site wet. Bathe or shower as directed by your caregiver.  · The incision site should heal in 5 to 7 days. A small scar may form after the  incision has healed.  ACCESSING THE PORT  Special steps must be taken to access the port:  · Before the port is accessed, a numbing cream can be placed on the skin. This helps numb the skin over the port site.  · A sterile technique is used to access the port.  · The port is accessed with a needle. Only "non-coring" port needles should be used to access the port. Once the port is accessed, a blood return should be checked. This helps ensure the port is in the vein and is not clogged (clotted).  · If your caregiver believes your port should remain accessed, a clear (transparent) bandage will be placed over the needle site. The bandage and needle will need to be changed every week or as directed by your caregiver.  · Keep the bandage covering the needle clean and dry. Do not get it wet. Follow your caregiver's instructions on how to take a shower or bath when the port is accessed.  · If your port does not need to stay accessed, no bandage is needed over the port.  FLUSHING THE PORT  Flushing the port keeps it from getting clogged. How often the port is flushed depends on:  · If a   constant infusion is running. If a constant infusion is running, the port may not need to be flushed.  · If intermittent medicines are given.  · If the port is not being used.  For intermittent medicines:  · The port will need to be flushed:  · After medicines have been given.  · After blood has been drawn.  · As part of routine maintenance.  · A port is normally flushed with:  · Normal saline.  · Heparin.  · Follow your caregiver's advice on how often, how much, and the type of flush to use on your port.  IMPORTANT PORT INFORMATION  · Tell your caregiver if you are allergic to heparin.  · After your port is placed, you will get a manufacturer's information card. The card has information about your port. Keep this card with you at all times.  · There are many types of ports available. Know what kind of port you have.  · In case of an  emergency, it may be helpful to wear a medical alert bracelet. This can help alert health care workers that you have a port.  · The port can stay in for as long as your caregiver believes it is necessary.  · When it is time for the port to come out, surgery will be done to remove it. The surgery will be similar to how the port was put in.  · If you are in the hospital or clinic:  · Your port will be taken care of and flushed by a nurse.  · If you are at home:  · A home health care nurse may give medicines and take care of the port.  · You or a family member can get special training and directions for giving medicine and taking care of the port at home.  SEEK IMMEDIATE MEDICAL CARE IF:   · Your port does not flush or you are unable to get a blood return.  · New drainage or pus is coming from the incision.  · A bad smell is coming from the incision site.  · You develop swelling or increased redness at the incision site.  · You develop increased swelling or pain at the port site.  · You develop swelling or pain in the surrounding skin near the port.  · You have an oral temperature above 102° F (38.9° C), not controlled by medicine.  MAKE SURE YOU:   · Understand these instructions.  · Will watch your condition.  · Will get help right away if you are not doing well or get worse.  Document Released: 06/06/2005 Document Revised: 08/29/2011 Document Reviewed: 08/28/2008  [employer]® Patient Information ©2014 [employer].

## 2013-06-17 NOTE — Telephone Encounter (Signed)
Per staff message and POF I have scheduled appts.  Jamie Munoz  

## 2013-06-17 NOTE — Progress Notes (Signed)
[facility] [phone]   [phone]  OFFICE PROGRESS NOTE  [name], MD [address] [place] [address]  DIAGNOSIS: Metastatic non-small cell lung cancer (T2 NX M1). Presented with right upper lobe lung mass as well as multiple pulmonary nodules and bone metastasis at L3 vertebral body. This was diagnosed in March of 2006.   PRIOR THERAPY:  1. Status post palliative radiotherapy to the L3 lesion under the care of Dr. Kathrynn Running completed September 17, 2004. 2. Status post 6 cycles of systemic chemotherapy with carboplatin and paclitaxel. Last dose was given January 01, 2005. 3. Status post palliative radiotherapy to the right upper lobe lung mass under the care of Dr. Kathrynn Running completed on 12/29/2010. 4. Status post treatment with Tarceva 150 mg p.o. daily started April 2007 through July 2012. 5. Tarceva 100 mg p.o. daily started July 2012. 6. Systemic chemotherapy with Carboplatin for AUC 5 and Alimta 500 mg/m2 given every 3 weeks. Status post 6 cycles. 7. Maintenance chemotherapy with single agent Alimta at 500 mg per meter squared given every 3 weeks, status post 1 cycle given on 07/30/2012.  CURRENT THERAPY:  1. Zometa 4 mg IV q. 3 months for bone metastasis. 2. Tarceva 100 mg by mouth daily resumed therapy on 01/07/13  CHEMOTHERAPY INTENT: Palliative  CURRENT # OF CHEMOTHERAPY CYCLES: 5 CURRENT ANTIEMETICS: Compazine  CURRENT SMOKING STATUS: Never Smoker  ORAL CHEMOTHERAPY AND CONSENT: yes  CURRENT BISPHOSPHONATES USE: Zometa every 3 months  PAIN MANAGEMENT: OxyContin  NARCOTICS INDUCED CONSTIPATION: yes but well managed with Senokot-S and as needed Dulcolax  LIVING WILL AND CODE STATUS: Full code  INTERVAL HISTORY: Jamie Munoz 79 y.o. female returns to the clinic today for followup visit. The patient is feeling fine today with no specific complaints. She is tolerating her current treatment with Tarceva fairly well with no  significant adverse effects. The patient denied having any skin rash or diarrhea. She continues to have mild fatigue. The patient denied having any chest pain, shortness of breath, cough or hemoptysis.  She enjoyed her Christmas holiday with her family.  MEDICAL HISTORY: Past Medical History  Diagnosis Date  . Cancer   . Lung cancer   . Hypertension   . Cellulitis of left lower extremity 07/16/2012    ALLERGIES:  is allergic to codeine.  MEDICATIONS:  Current Outpatient Prescriptions  Medication Sig Dispense Refill  . ALPRAZolam (XANAX) 0.25 MG tablet Take 0.25 mg by mouth 3 (three) times daily as needed for anxiety.       Marland Kitchen aspirin EC 81 MG tablet Take 81 mg by mouth daily.      Marland Kitchen BESIVANCE 0.6 % SUSP       . bisacodyl (DULCOLAX) 5 MG EC tablet Take 5 mg by mouth daily as needed for constipation.       . clindamycin (CLEOCIN T) 1 % lotion Apply 1 application topically 2 (two) times daily. Applied to face.      . clobetasol ointment (TEMOVATE) 0.05 % Apply 1 application topically daily.      Marland Kitchen docusate sodium (COLACE) 100 MG capsule Take 100 mg by mouth 2 (two) times daily as needed for constipation.       . DUREZOL 0.05 % EMUL       . gabapentin (NEURONTIN) 100 MG capsule TAKE (2) CAPSULES THREE TIMES DAILY.  180 capsule  1  . lidocaine-prilocaine (EMLA) cream Apply 1 application topically as needed (for port-a-cath access.).       Marland Kitchen  olmesartan (BENICAR) 20 MG tablet Take 20 mg by mouth daily.        . OxyCODONE (OXYCONTIN) 20 mg T12A 12 hr tablet Take 1 tablet (20 mg total) by mouth at bedtime.  60 tablet  0  . oxyCODONE-acetaminophen (PERCOCET/ROXICET) 5-325 MG per tablet       . polyethylene glycol (MIRALAX / GLYCOLAX) packet Take 17 g by mouth 2 (two) times daily.  14 each    . prochlorperazine (COMPAZINE) 10 MG tablet Take 10 mg by mouth every 6 (six) hours as needed (for nausea.).      Marland Kitchen senna-docusate (SENOKOT-S) 8.6-50 MG per tablet Take 1 tablet by mouth at bedtime.      Marland Kitchen  TARCEVA 100 MG tablet TAKE 1 TABLET BY MOUTH DAILY. TAKE ON AN EMPTY STOMACH 1 HOUR BEFORE MEALS OR 2 HOURS AFTER  30 tablet  2  . temazepam (RESTORIL) 15 MG capsule TAKE 1 CAPSULE AT BEDTIME AS NEEDED FOR INSOMNIA  30 capsule  0   No current facility-administered medications for this visit.   Facility-Administered Medications Ordered in Other Visits  Medication Dose Route Frequency Provider Last Rate Last Dose  . influenza  inactive virus vaccine (FLUZONE/FLUARIX) injection 0.5 mL  0.5 mL Intramuscular Once Si Gaul, MD        SURGICAL HISTORY:  Past Surgical History  Procedure Laterality Date  . Cholecystectomy    . Abdominal hysterectomy    . Breast surgery    . Vaginal sling      REVIEW OF SYSTEMS:  Constitutional: positive for fatigue Eyes: negative Ears, nose, mouth, throat, and face: negative Respiratory: negative Cardiovascular: negative Gastrointestinal: negative Genitourinary:negative Integument/breast: negative Hematologic/lymphatic: negative Musculoskeletal:negative Neurological: negative Behavioral/Psych: negative Endocrine: negative Allergic/Immunologic: negative   PHYSICAL EXAMINATION: General appearance: alert, cooperative and no distress Head: Normocephalic, without obvious abnormality, atraumatic Neck: no adenopathy, no JVD, supple, symmetrical, trachea midline and thyroid not enlarged, symmetric, no tenderness/mass/nodules Lymph nodes: Cervical, supraclavicular, and axillary nodes normal. Resp: clear to auscultation bilaterally Back: symmetric, no curvature. ROM normal. No CVA tenderness. Cardio: regular rate and rhythm, S1, S2 normal, no murmur, click, rub or gallop GI: soft, non-tender; bowel sounds normal; no masses,  no organomegaly Extremities: extremities normal, atraumatic, no cyanosis or edema Neurologic: Alert and oriented X 3, normal strength and tone. Normal symmetric reflexes. Normal coordination and gait  ECOG PERFORMANCE STATUS: 1 -  Symptomatic but completely ambulatory  Height 5\' 6"  (1.676 m), weight 134 lb 6.4 oz (60.963 kg).  LABORATORY DATA: Lab Results  Component Value Date   WBC 4.9 06/17/2013   HGB 9.0* 06/17/2013   HCT 26.2* 06/17/2013   MCV 101.2* 06/17/2013   PLT 168 06/17/2013      Chemistry      Component Value Date/Time   NA 142 06/17/2013 0841   NA 141 08/18/2012 0450   NA 139 09/02/2011 1406   K 4.3 06/17/2013 0841   K 3.7 08/18/2012 0450   K 3.9 09/02/2011 1406   CL 105 10/02/2012 1056   CL 108 08/18/2012 0450   CL 97* 09/02/2011 1406   CO2 24 06/17/2013 0841   CO2 26 08/18/2012 0450   CO2 29 09/02/2011 1406   BUN 23.0 06/17/2013 0841   BUN 17 08/18/2012 0450   BUN 18 09/02/2011 1406   CREATININE 1.2* 06/17/2013 0841   CREATININE 1.08 08/18/2012 0450   CREATININE 1.2 09/02/2011 1406      Component Value Date/Time   CALCIUM 8.4 06/17/2013 0841   CALCIUM  7.6* 08/18/2012 0450   CALCIUM 8.0 09/02/2011 1406   ALKPHOS 43 06/17/2013 0841   ALKPHOS 97 08/15/2012 0340   ALKPHOS 71 09/02/2011 1406   AST 17 06/17/2013 0841   AST 31 08/15/2012 0340   AST 25 09/02/2011 1406   ALT 11 06/17/2013 0841   ALT 18 08/15/2012 0340   ALT 20 09/02/2011 1406   BILITOT 0.48 06/17/2013 0841   BILITOT 0.4 08/15/2012 0340   BILITOT 0.60 09/02/2011 1406       RADIOGRAPHIC STUDIES:  ASSESSMENT AND PLAN:  This is a very pleasant 79 years old white female with metastatic non-small cell lung cancer diagnosed in March 2006 and has been on treatment with several chemotherapy regimen including almost 3 years of Tarceva and she is currently on Tarceva 100 mg by mouth daily and tolerating it fairly well with no significant skin rash or diarrhea. She status post 5 months of treatment. I recommended for her to continue treatment with Tarceva with the same dose. She was advised to call immediately if she has any concerning symptoms in the interval  The patient voices understanding of current disease status and treatment options and is  in agreement with the current care plan.  All questions were answered. The patient knows to call the clinic with any problems, questions or concerns. We can certainly see the patient much sooner if necessary.

## 2013-06-17 NOTE — Patient Instructions (Signed)
CURRENT THERAPY:  1. Zometa 4 mg IV q. 3 months for bone metastasis. 2. Tarceva 100 mg by mouth daily resumed therapy on 01/07/13 CHEMOTHERAPY INTENT: Palliative  CURRENT # OF CHEMOTHERAPY CYCLES: 5  CURRENT ANTIEMETICS: Compazine  CURRENT SMOKING STATUS: Never Smoker  ORAL CHEMOTHERAPY AND CONSENT: yes  CURRENT BISPHOSPHONATES USE: Zometa every 3 months  PAIN MANAGEMENT: OxyContin  NARCOTICS INDUCED CONSTIPATION: yes but well managed with Senokot-S and as needed Dulcolax  LIVING WILL AND CODE STATUS: Full code

## 2013-06-28 ENCOUNTER — Ambulatory Visit (HOSPITAL_BASED_OUTPATIENT_CLINIC_OR_DEPARTMENT_OTHER): Payer: Medicare Other

## 2013-06-28 VITALS — BP 151/52 | HR 93 | Temp 98.3°F | Resp 16

## 2013-06-28 DIAGNOSIS — C3410 Malignant neoplasm of upper lobe, unspecified bronchus or lung: Secondary | ICD-10-CM

## 2013-06-28 DIAGNOSIS — C7951 Secondary malignant neoplasm of bone: Secondary | ICD-10-CM

## 2013-06-28 DIAGNOSIS — C3491 Malignant neoplasm of unspecified part of right bronchus or lung: Secondary | ICD-10-CM

## 2013-06-28 DIAGNOSIS — C7952 Secondary malignant neoplasm of bone marrow: Secondary | ICD-10-CM

## 2013-06-28 MED ORDER — HEPARIN SOD (PORK) LOCK FLUSH 100 UNIT/ML IV SOLN
500.0000 [IU] | Freq: Once | INTRAVENOUS | Status: AC | PRN
  Administered 2013-06-28: 500 [IU]
  Filled 2013-06-28: qty 5

## 2013-06-28 MED ORDER — SODIUM CHLORIDE 0.9 % IV SOLN
Freq: Once | INTRAVENOUS | Status: AC
  Administered 2013-06-28: 09:00:00 via INTRAVENOUS

## 2013-06-28 MED ORDER — SODIUM CHLORIDE 0.9 % IJ SOLN
3.0000 mL | Freq: Once | INTRAMUSCULAR | Status: DC | PRN
  Filled 2013-06-28: qty 10

## 2013-06-28 MED ORDER — ZOLEDRONIC ACID 4 MG/100ML IV SOLN
4.0000 mg | Freq: Once | INTRAVENOUS | Status: AC
  Administered 2013-06-28: 4 mg via INTRAVENOUS
  Filled 2013-06-28: qty 100

## 2013-06-28 MED ORDER — SODIUM CHLORIDE 0.9 % IJ SOLN
10.0000 mL | INTRAMUSCULAR | Status: DC | PRN
  Administered 2013-06-28: 10 mL
  Filled 2013-06-28: qty 10

## 2013-06-28 MED ORDER — HEPARIN SOD (PORK) LOCK FLUSH 100 UNIT/ML IV SOLN
250.0000 [IU] | Freq: Once | INTRAVENOUS | Status: DC | PRN
  Filled 2013-06-28: qty 5

## 2013-06-28 NOTE — Patient Instructions (Signed)
[facility] Discharge Instructions for Patients Receiving Chemotherapy  Today you received the following chemotherapy agents Zometa.   To help prevent nausea and vomiting after your treatment, we encourage you to take your nausea medication as prescribed.  Take 10mg  Compazine as needed.     If you develop nausea and vomiting that is not controlled by your nausea medication, call the clinic.   BELOW ARE SYMPTOMS THAT SHOULD BE REPORTED IMMEDIATELY:  *FEVER GREATER THAN 100.5 F  *CHILLS WITH OR WITHOUT FEVER  NAUSEA AND VOMITING THAT IS NOT CONTROLLED WITH YOUR NAUSEA MEDICATION  *UNUSUAL SHORTNESS OF BREATH  *UNUSUAL BRUISING OR BLEEDING  TENDERNESS IN MOUTH AND THROAT WITH OR WITHOUT PRESENCE OF ULCERS  *URINARY PROBLEMS  *BOWEL PROBLEMS  UNUSUAL RASH Items with * indicate a potential emergency and should be followed up as soon as possible.  Feel free to call the clinic should you have any questions or concerns. The clinic phone number is [phone].  Fall Prevention and Home Safety Falls cause injuries and can affect all age groups. It is possible to use preventive measures to significantly decrease the likelihood of falls. There are many simple measures which can make your home safer and prevent falls. OUTDOORS  Repair cracks and edges of walkways and driveways.  Remove high doorway thresholds.  Trim shrubbery on the main path into your home.  Have good outside lighting.  Clear walkways of tools, rocks, debris, and clutter.  Check that handrails are not broken and are securely fastened. Both sides of steps should have handrails.  Have leaves, snow, and ice cleared regularly.  Use sand or salt on walkways during winter months.  In the garage, clean up grease or oil spills. BATHROOM  Install night lights.  Install grab bars by the toilet and in the tub and shower.  Use non-skid mats or decals in the tub or shower.  Place a plastic  non-slip stool in the shower to sit on, if needed.  Keep floors dry and clean up all water on the floor immediately.  Remove soap buildup in the tub or shower on a regular basis.  Secure bath mats with non-slip, double-sided rug tape.  Remove throw rugs and tripping hazards from the floors. BEDROOMS  Install night lights.  Make sure a bedside light is easy to reach.  Do not use oversized bedding.  Keep a telephone by your bedside.  Have a firm chair with side arms to use for getting dressed.  Remove throw rugs and tripping hazards from the floor. KITCHEN  Keep handles on pots and pans turned toward the center of the stove. Use back burners when possible.  Clean up spills quickly and allow time for drying.  Avoid walking on wet floors.  Avoid hot utensils and knives.  Position shelves so they are not too high or low.  Place commonly used objects within easy reach.  If necessary, use a sturdy step stool with a grab bar when reaching.  Keep electrical cables out of the way.  Do not use floor polish or wax that makes floors slippery. If you must use wax, use non-skid floor wax.  Remove throw rugs and tripping hazards from the floor. STAIRWAYS  Never leave objects on stairs.  Place handrails on both sides of stairways and use them. Fix any loose handrails. Make sure handrails on both sides of the stairways are as long as the stairs.  Check carpeting to make sure it is firmly attached along stairs.  Make repairs to worn or loose carpet promptly.  Avoid placing throw rugs at the top or bottom of stairways, or properly secure the rug with carpet tape to prevent slippage. Get rid of throw rugs, if possible.  Have an electrician put in a light switch at the top and bottom of the stairs. OTHER FALL PREVENTION TIPS  Wear low-heel or rubber-soled shoes that are supportive and fit well. Wear closed toe shoes.  When using a stepladder, make sure it is fully opened and both  spreaders are firmly locked. Do not climb a closed stepladder.  Add color or contrast paint or tape to grab bars and handrails in your home. Place contrasting color strips on first and last steps.  Learn and use mobility aids as needed. Install an electrical emergency response system.  Turn on lights to avoid dark areas. Replace light bulbs that burn out immediately. Get light switches that glow.  Arrange furniture to create clear pathways. Keep furniture in the same place.  Firmly attach carpet with non-skid or double-sided tape.  Eliminate uneven floor surfaces.  Select a carpet pattern that does not visually hide the edge of steps.  Be aware of all pets. OTHER HOME SAFETY TIPS  Set the water temperature for 120 F (48.8 C).  Keep emergency numbers on or near the telephone.  Keep smoke detectors on every level of the home and near sleeping areas. Document Released: 05/27/2002 Document Revised: 12/06/2011 Document Reviewed: 08/26/2011 [facility] Patient Information 2014 [employer].  It was my pleasure to take care of you today!  Leeanne Rio, RN

## 2013-06-28 NOTE — Progress Notes (Signed)
Pt states she often stumbles. States she has a cane, but is too proud to use it. Nurse encouraged pt. To use the cane, especially when away from home to prevent falls.

## 2013-07-03 ENCOUNTER — Other Ambulatory Visit: Payer: Self-pay | Admitting: Internal Medicine

## 2013-07-03 DIAGNOSIS — C3490 Malignant neoplasm of unspecified part of unspecified bronchus or lung: Secondary | ICD-10-CM

## 2013-07-15 ENCOUNTER — Telehealth: Payer: Self-pay | Admitting: Internal Medicine

## 2013-07-15 NOTE — Telephone Encounter (Signed)
pt called to r/s appt due to being sick....done.Marland Kitchen

## 2013-07-16 ENCOUNTER — Other Ambulatory Visit: Payer: Medicare Other

## 2013-07-16 ENCOUNTER — Ambulatory Visit (HOSPITAL_COMMUNITY): Payer: Medicare Other

## 2013-07-18 ENCOUNTER — Ambulatory Visit: Payer: Medicare Other | Admitting: Internal Medicine

## 2013-07-23 ENCOUNTER — Ambulatory Visit (HOSPITAL_COMMUNITY)
Admission: RE | Admit: 2013-07-23 | Discharge: 2013-07-23 | Disposition: A | Discharge status: Home / Self Care | Payer: Medicare Other | Source: Ambulatory Visit | Attending: Internal Medicine | Admitting: Internal Medicine

## 2013-07-23 ENCOUNTER — Other Ambulatory Visit: Payer: Self-pay | Admitting: Internal Medicine

## 2013-07-23 ENCOUNTER — Other Ambulatory Visit (HOSPITAL_BASED_OUTPATIENT_CLINIC_OR_DEPARTMENT_OTHER): Payer: Medicare Other

## 2013-07-23 ENCOUNTER — Encounter (HOSPITAL_COMMUNITY): Payer: Self-pay

## 2013-07-23 DIAGNOSIS — C3410 Malignant neoplasm of upper lobe, unspecified bronchus or lung: Secondary | ICD-10-CM

## 2013-07-23 DIAGNOSIS — Z9221 Personal history of antineoplastic chemotherapy: Secondary | ICD-10-CM | POA: Insufficient documentation

## 2013-07-23 DIAGNOSIS — K5730 Diverticulosis of large intestine without perforation or abscess without bleeding: Secondary | ICD-10-CM | POA: Insufficient documentation

## 2013-07-23 DIAGNOSIS — C3490 Malignant neoplasm of unspecified part of unspecified bronchus or lung: Secondary | ICD-10-CM

## 2013-07-23 DIAGNOSIS — C7951 Secondary malignant neoplasm of bone: Secondary | ICD-10-CM | POA: Insufficient documentation

## 2013-07-23 DIAGNOSIS — C7952 Secondary malignant neoplasm of bone marrow: Secondary | ICD-10-CM

## 2013-07-23 LAB — COMPREHENSIVE METABOLIC PANEL (CC13)
ALBUMIN: 3.7 g/dL (ref 3.5–5.0)
ALT: 11 U/L (ref 0–55)
AST: 17 U/L (ref 5–34)
Alkaline Phosphatase: 59 U/L (ref 40–150)
Anion Gap: 10 mEq/L (ref 3–11)
BUN: 18.6 mg/dL (ref 7.0–26.0)
CHLORIDE: 108 meq/L (ref 98–109)
CO2: 26 mEq/L (ref 22–29)
Calcium: 8.8 mg/dL (ref 8.4–10.4)
Creatinine: 1.4 mg/dL — ABNORMAL HIGH (ref 0.6–1.1)
GLUCOSE: 94 mg/dL (ref 70–140)
POTASSIUM: 4.2 meq/L (ref 3.5–5.1)
Sodium: 143 mEq/L (ref 136–145)
TOTAL PROTEIN: 6.4 g/dL (ref 6.4–8.3)
Total Bilirubin: 0.54 mg/dL (ref 0.20–1.20)

## 2013-07-23 LAB — CBC WITH DIFFERENTIAL/PLATELET
BASO%: 0.2 % (ref 0.0–2.0)
BASOS ABS: 0 10*3/uL (ref 0.0–0.1)
EOS ABS: 0.1 10*3/uL (ref 0.0–0.5)
EOS%: 1.4 % (ref 0.0–7.0)
HCT: 28.3 % — ABNORMAL LOW (ref 34.8–46.6)
HEMOGLOBIN: 9.2 g/dL — AB (ref 11.6–15.9)
LYMPH#: 1 10*3/uL (ref 0.9–3.3)
LYMPH%: 18.1 % (ref 14.0–49.7)
MCH: 33 pg (ref 25.1–34.0)
MCHC: 32.5 g/dL (ref 31.5–36.0)
MCV: 101.4 fL — ABNORMAL HIGH (ref 79.5–101.0)
MONO#: 0.5 10*3/uL (ref 0.1–0.9)
MONO%: 9.7 % (ref 0.0–14.0)
NEUT%: 70.6 % (ref 38.4–76.8)
NEUTROS ABS: 4 10*3/uL (ref 1.5–6.5)
Platelets: 209 10*3/uL (ref 145–400)
RBC: 2.79 10*6/uL — ABNORMAL LOW (ref 3.70–5.45)
RDW: 13.9 % (ref 11.2–14.5)
WBC: 5.6 10*3/uL (ref 3.9–10.3)

## 2013-07-25 ENCOUNTER — Ambulatory Visit (HOSPITAL_BASED_OUTPATIENT_CLINIC_OR_DEPARTMENT_OTHER): Payer: Medicare Other | Admitting: Internal Medicine

## 2013-07-25 ENCOUNTER — Telehealth: Payer: Self-pay | Admitting: Internal Medicine

## 2013-07-25 ENCOUNTER — Other Ambulatory Visit (HOSPITAL_COMMUNITY): Payer: Self-pay | Admitting: Diagnostic Radiology

## 2013-07-25 ENCOUNTER — Encounter: Payer: Self-pay | Admitting: Internal Medicine

## 2013-07-25 ENCOUNTER — Encounter (INDEPENDENT_AMBULATORY_CARE_PROVIDER_SITE_OTHER): Payer: Self-pay

## 2013-07-25 VITALS — BP 141/58 | HR 67 | Temp 98.3°F | Resp 18 | Ht 66.0 in | Wt 132.5 lb

## 2013-07-25 DIAGNOSIS — R5383 Other fatigue: Secondary | ICD-10-CM

## 2013-07-25 DIAGNOSIS — D649 Anemia, unspecified: Secondary | ICD-10-CM

## 2013-07-25 DIAGNOSIS — C7951 Secondary malignant neoplasm of bone: Secondary | ICD-10-CM

## 2013-07-25 DIAGNOSIS — M549 Dorsalgia, unspecified: Secondary | ICD-10-CM

## 2013-07-25 DIAGNOSIS — C7952 Secondary malignant neoplasm of bone marrow: Secondary | ICD-10-CM

## 2013-07-25 DIAGNOSIS — R5381 Other malaise: Secondary | ICD-10-CM

## 2013-07-25 DIAGNOSIS — C3490 Malignant neoplasm of unspecified part of unspecified bronchus or lung: Secondary | ICD-10-CM

## 2013-07-25 DIAGNOSIS — C3410 Malignant neoplasm of upper lobe, unspecified bronchus or lung: Secondary | ICD-10-CM

## 2013-07-25 MED ORDER — INTEGRA PLUS PO CAPS: 1.0000 | ORAL_CAPSULE | Freq: Every morning | ORAL | Status: DC

## 2013-07-25 NOTE — Progress Notes (Signed)
[employer] [phone]   [phone]  OFFICE PROGRESS NOTE  [name], MD [address] [place] [address]  DIAGNOSIS: Metastatic non-small cell lung cancer (T2 NX M1). Presented with right upper lobe lung mass as well as multiple pulmonary nodules and bone metastasis at L3 vertebral body. This was diagnosed in March of 2006.   PRIOR THERAPY:  1. Status post palliative radiotherapy to the L3 lesion under the care of Dr. Tammi Klippel completed September 17, 2004. 2. Status post 6 cycles of systemic chemotherapy with carboplatin and paclitaxel. Last dose was given January 01, 2005. 3. Status post palliative radiotherapy to the right upper lobe lung mass under the care of Dr. Tammi Klippel completed on 12/29/2010. 4. Status post treatment with Tarceva 150 mg p.o. daily started April 2007 through July 2012. 5. Tarceva 100 mg p.o. daily started July 2012. 6. Systemic chemotherapy with Carboplatin for AUC 5 and Alimta 500 mg/m2 given every 3 weeks. Status post 6 cycles. 7. Maintenance chemotherapy with single agent Alimta at 500 mg per meter squared given every 3 weeks, status post 1 cycle given on 07/30/2012.  CURRENT THERAPY:  1. Zometa 4 mg IV q. 3 months for bone metastasis. 2. Tarceva 100 mg by mouth daily resumed therapy on 01/07/13  CHEMOTHERAPY INTENT: Palliative  CURRENT # OF CHEMOTHERAPY CYCLES: 6 CURRENT ANTIEMETICS: Compazine  CURRENT SMOKING STATUS: Never Smoker  ORAL CHEMOTHERAPY AND CONSENT: yes  CURRENT BISPHOSPHONATES USE: Zometa every 3 months  PAIN MANAGEMENT: OxyContin  NARCOTICS INDUCED CONSTIPATION: yes but well managed with Senokot-S and as needed Dulcolax  LIVING WILL AND CODE STATUS: Full code  INTERVAL HISTORY: Jamie Munoz 80 y.o. female returns to the clinic today for followup visit. The patient is feeling fine today with no specific complaints except for occasional back pain and fatigue. She is tolerating her current  treatment with Tarceva fairly well with no significant adverse effects. The patient denied having any skin rash or diarrhea. She continues to have mild fatigue. The patient denied having any chest pain, shortness of breath, cough or hemoptysis.  She had repeat CT scan of the chest, abdomen and pelvis performed recently and she is here for evaluation and discussion of her scan results. MEDICAL HISTORY: Past Medical History  Diagnosis Date  . Cancer   . Lung cancer   . Hypertension   . Cellulitis of left lower extremity 07/16/2012    ALLERGIES:  is allergic to codeine.  MEDICATIONS:  Current Outpatient Prescriptions  Medication Sig Dispense Refill  . ALPRAZolam (XANAX) 0.25 MG tablet Take 0.25 mg by mouth 3 (three) times daily as needed for anxiety.       Marland Kitchen aspirin EC 81 MG tablet Take 81 mg by mouth daily.      Marland Kitchen BESIVANCE 0.6 % SUSP       . bisacodyl (DULCOLAX) 5 MG EC tablet Take 5 mg by mouth daily as needed for constipation.       . clindamycin (CLEOCIN T) 1 % lotion Apply 1 application topically 2 (two) times daily. Applied to face.      . clobetasol ointment (TEMOVATE) 7.56 % Apply 1 application topically daily.      Marland Kitchen docusate sodium (COLACE) 100 MG capsule Take 100 mg by mouth 2 (two) times daily as needed for constipation.       . DUREZOL 0.05 % EMUL       . gabapentin (NEURONTIN) 100 MG capsule TAKE (2)  CAPSULES THREE TIMES DAILY.  180 capsule  1  . lidocaine-prilocaine (EMLA) cream Apply 1 application topically as needed (for port-a-cath access.).       Marland Kitchen olmesartan (BENICAR) 20 MG tablet Take 20 mg by mouth daily.        . OxyCODONE (OXYCONTIN) 20 mg T12A 12 hr tablet Take 1 tablet (20 mg total) by mouth at bedtime.  60 tablet  0  . oxyCODONE-acetaminophen (PERCOCET/ROXICET) 5-325 MG per tablet       . polyethylene glycol (MIRALAX / GLYCOLAX) packet Take 17 g by mouth 2 (two) times daily.  14 each    . prochlorperazine (COMPAZINE) 10 MG tablet Take 10 mg by mouth every 6 (six)  hours as needed (for nausea.).      Marland Kitchen senna-docusate (SENOKOT-S) 8.6-50 MG per tablet Take 1 tablet by mouth at bedtime.      Marland Kitchen TARCEVA 100 MG tablet TAKE 1 TABLET BY MOUTH DAILY. TAKE ON AN EMPTY STOMACH 1 HOUR BEFORE MEALS OR 2 HOURS AFTER  30 tablet  1  . temazepam (RESTORIL) 15 MG capsule TAKE 1 CAPSULE AT BEDTIME AS NEEDED FOR INSOMNIA  30 capsule  0   No current facility-administered medications for this visit.   Facility-Administered Medications Ordered in Other Visits  Medication Dose Route Frequency Provider Last Rate Last Dose  . influenza  inactive virus vaccine (FLUZONE/FLUARIX) injection 0.5 mL  0.5 mL Intramuscular Once Curt Bears, MD        SURGICAL HISTORY:  Past Surgical History  Procedure Laterality Date  . Cholecystectomy    . Abdominal hysterectomy    . Breast surgery    . Vaginal sling      REVIEW OF SYSTEMS:  Constitutional: positive for fatigue Eyes: negative Ears, nose, mouth, throat, and face: negative Respiratory: negative Cardiovascular: negative Gastrointestinal: negative Genitourinary:negative Integument/breast: negative Hematologic/lymphatic: negative Musculoskeletal:negative Neurological: negative Behavioral/Psych: negative Endocrine: negative Allergic/Immunologic: negative   PHYSICAL EXAMINATION: General appearance: alert, cooperative and no distress Head: Normocephalic, without obvious abnormality, atraumatic Neck: no adenopathy, no JVD, supple, symmetrical, trachea midline and thyroid not enlarged, symmetric, no tenderness/mass/nodules Lymph nodes: Cervical, supraclavicular, and axillary nodes normal. Resp: clear to auscultation bilaterally Back: symmetric, no curvature. ROM normal. No CVA tenderness. Cardio: regular rate and rhythm, S1, S2 normal, no murmur, click, rub or gallop GI: soft, non-tender; bowel sounds normal; no masses,  no organomegaly Extremities: extremities normal, atraumatic, no cyanosis or edema Neurologic: Alert  and oriented X 3, normal strength and tone. Normal symmetric reflexes. Normal coordination and gait  ECOG PERFORMANCE STATUS: 1 - Symptomatic but completely ambulatory  Blood pressure 141/58, pulse 67, temperature 98.3 F (36.8 C), temperature source Oral, resp. rate 18, height 5\' 6"  (1.676 m), weight 132 lb 8 oz (60.102 kg), SpO2 98.00%.  LABORATORY DATA: Lab Results  Component Value Date   WBC 5.6 07/23/2013   HGB 9.2* 07/23/2013   HCT 28.3* 07/23/2013   MCV 101.4* 07/23/2013   PLT 209 07/23/2013      Chemistry      Component Value Date/Time   NA 143 07/23/2013 0926   NA 141 08/18/2012 0450   NA 139 09/02/2011 1406   K 4.2 07/23/2013 0926   K 3.7 08/18/2012 0450   K 3.9 09/02/2011 1406   CL 105 10/02/2012 1056   CL 108 08/18/2012 0450   CL 97* 09/02/2011 1406   CO2 26 07/23/2013 0926   CO2 26 08/18/2012 0450   CO2 29 09/02/2011 1406   BUN 18.6 07/23/2013  0926   BUN 17 08/18/2012 0450   BUN 18 09/02/2011 1406   CREATININE 1.4* 07/23/2013 0926   CREATININE 1.08 08/18/2012 0450   CREATININE 1.2 09/02/2011 1406      Component Value Date/Time   CALCIUM 8.8 07/23/2013 0926   CALCIUM 7.6* 08/18/2012 0450   CALCIUM 8.0 09/02/2011 1406   ALKPHOS 59 07/23/2013 0926   ALKPHOS 97 08/15/2012 0340   ALKPHOS 71 09/02/2011 1406   AST 17 07/23/2013 0926   AST 31 08/15/2012 0340   AST 25 09/02/2011 1406   ALT 11 07/23/2013 0926   ALT 18 08/15/2012 0340   ALT 20 09/02/2011 1406   BILITOT 0.54 07/23/2013 0926   BILITOT 0.4 08/15/2012 0340   BILITOT 0.60 09/02/2011 1406       RADIOGRAPHIC STUDIES: Ct Chest Wo Contrast  07/25/2013   CLINICAL DATA:  Lung cancer, chemotherapy complete. Restaging exam  EXAM: CT CHEST, ABDOMEN AND PELVIS WITHOUT CONTRAST  TECHNIQUE: Multidetector CT imaging of the chest, abdomen and pelvis was performed following the standard protocol without IV contrast.  COMPARISON:  CT ABD/PELVIS W CM dated 04/17/2013;   CT ABD/PELVIS W CM dated 01/01/2013  FINDINGS:   CT CHEST FINDINGS  The right upper lobe mass is  increased in volume measuring 3.8 x 3.4 cm compared to 3.4 x 3.2 cm. No new pulmonary nodules or masses.  No axillary or supraclavicular lymphadenopathy. There is a port in the right chest wall. No mediastinal lymphadenopathy. No pericardial fluid. Esophagus is normal.    CT ABDOMEN AND PELVIS FINDINGS  Non IV contrast images demonstrate no focal hepatic lesion. The gallbladder is surgically absent. Pancreas, spleen, adrenal glands, and kidneys are normal.  The stomach, small bowel, cecum are normal. There are diverticula of the sigmoid colon.  Abdominal aorta is normal caliber. No retroperitoneal periportal lymphadenopathy.  No free fluid the pelvis. Post hysterectomy anatomy. No pelvic lymphadenopathy. Rounded sclerotic lesion measuring 14 mm at L1 is unchanged. Sclerotic lesion within the left aspect of the inferior sternum is also unchanged. Compression fracture at L3 with augmentation.    IMPRESSION: 1. Mild interval enlargement of right upper lobe mass. 2. Stable sclerotic metastasis at L1 and within the sternum. 3. No evidence of mediastinal metastasis.   Electronically Signed   By: Suzy Bouchard M.D.   On: 07/25/2013 08:42   ASSESSMENT AND PLAN:  This is a very pleasant 80 years old white female with metastatic non-small cell lung cancer diagnosed in March 2006 and has been on treatment with several chemotherapy regimen including almost 5 years of Tarceva 150 mg by mouth daily and she is currently on Tarceva 100 mg by mouth daily and tolerating it fairly well with no significant skin rash or diarrhea. She status post 6 months of treatment. Her recent scan showed stable disease except for minimal increase in the size of the right upper lobe mass. I discussed the scan results with the patient today. I recommended for her to continue her current treatment with Tarceva 100 mg by mouth daily. For the persistent anemia, I will start the patient on Integra plus  capsule by mouth daily. She would come  back for followup visit in one month's for reevaluation and management any adverse effect of her treatment. She was advised to call immediately if she has any concerning symptoms in the interval  The patient voices understanding of current disease status and treatment options and is in agreement with the current care plan.  All questions were answered.  The patient knows to call the clinic with any problems, questions or concerns. We can certainly see the patient much sooner if necessary.  Disclaimer: This note was dictated with voice recognition software. Similar sounding words can inadvertently be transcribed and may not be corrected upon review.

## 2013-07-25 NOTE — Telephone Encounter (Signed)
gv and printed appt sched and avs for pt for March adn April    °

## 2013-07-27 ENCOUNTER — Encounter: Payer: Self-pay | Admitting: Internal Medicine

## 2013-07-27 NOTE — Patient Instructions (Signed)
Continue treatment with Tarceva as scheduled. Follow up visit in one month.

## 2013-08-19 ENCOUNTER — Other Ambulatory Visit: Payer: Self-pay | Admitting: Medical Oncology

## 2013-08-19 DIAGNOSIS — C3490 Malignant neoplasm of unspecified part of unspecified bronchus or lung: Secondary | ICD-10-CM

## 2013-08-19 MED ORDER — OXYCODONE HCL ER 20 MG PO T12A: 20.0000 mg | EXTENDED_RELEASE_TABLET | Freq: Every day | ORAL | Status: DC

## 2013-08-19 NOTE — Telephone Encounter (Signed)
Pt is out of pain med and rx mailed to pt.

## 2013-08-22 ENCOUNTER — Ambulatory Visit (HOSPITAL_BASED_OUTPATIENT_CLINIC_OR_DEPARTMENT_OTHER): Payer: Medicare Other

## 2013-08-22 ENCOUNTER — Telehealth: Payer: Self-pay | Admitting: Internal Medicine

## 2013-08-22 ENCOUNTER — Ambulatory Visit (HOSPITAL_BASED_OUTPATIENT_CLINIC_OR_DEPARTMENT_OTHER): Payer: Medicare Other | Admitting: Physician Assistant

## 2013-08-22 ENCOUNTER — Other Ambulatory Visit (HOSPITAL_BASED_OUTPATIENT_CLINIC_OR_DEPARTMENT_OTHER): Payer: Medicare Other

## 2013-08-22 ENCOUNTER — Encounter: Payer: Self-pay | Admitting: Physician Assistant

## 2013-08-22 VITALS — BP 138/57 | HR 69 | Temp 98.3°F | Resp 19 | Ht 66.0 in | Wt 131.9 lb

## 2013-08-22 DIAGNOSIS — Z95828 Presence of other vascular implants and grafts: Secondary | ICD-10-CM

## 2013-08-22 DIAGNOSIS — C3490 Malignant neoplasm of unspecified part of unspecified bronchus or lung: Secondary | ICD-10-CM

## 2013-08-22 DIAGNOSIS — C3410 Malignant neoplasm of upper lobe, unspecified bronchus or lung: Secondary | ICD-10-CM

## 2013-08-22 DIAGNOSIS — C7951 Secondary malignant neoplasm of bone: Secondary | ICD-10-CM

## 2013-08-22 DIAGNOSIS — Z452 Encounter for adjustment and management of vascular access device: Secondary | ICD-10-CM

## 2013-08-22 DIAGNOSIS — C7952 Secondary malignant neoplasm of bone marrow: Secondary | ICD-10-CM

## 2013-08-22 DIAGNOSIS — D649 Anemia, unspecified: Secondary | ICD-10-CM

## 2013-08-22 LAB — COMPREHENSIVE METABOLIC PANEL (CC13)
ALK PHOS: 52 U/L (ref 40–150)
ALT: 10 U/L (ref 0–55)
AST: 19 U/L (ref 5–34)
Albumin: 3.4 g/dL — ABNORMAL LOW (ref 3.5–5.0)
Anion Gap: 8 mEq/L (ref 3–11)
BILIRUBIN TOTAL: 0.59 mg/dL (ref 0.20–1.20)
BUN: 26.6 mg/dL — ABNORMAL HIGH (ref 7.0–26.0)
CO2: 26 mEq/L (ref 22–29)
Calcium: 8.8 mg/dL (ref 8.4–10.4)
Chloride: 108 mEq/L (ref 98–109)
Creatinine: 1.4 mg/dL — ABNORMAL HIGH (ref 0.6–1.1)
GLUCOSE: 110 mg/dL (ref 70–140)
Potassium: 4.5 mEq/L (ref 3.5–5.1)
SODIUM: 143 meq/L (ref 136–145)
TOTAL PROTEIN: 6.1 g/dL — AB (ref 6.4–8.3)

## 2013-08-22 LAB — CBC WITH DIFFERENTIAL/PLATELET
BASO%: 0.6 % (ref 0.0–2.0)
Basophils Absolute: 0 10*3/uL (ref 0.0–0.1)
EOS%: 1.2 % (ref 0.0–7.0)
Eosinophils Absolute: 0.1 10*3/uL (ref 0.0–0.5)
HCT: 27 % — ABNORMAL LOW (ref 34.8–46.6)
HGB: 9.2 g/dL — ABNORMAL LOW (ref 11.6–15.9)
LYMPH%: 16.7 % (ref 14.0–49.7)
MCH: 34.8 pg — ABNORMAL HIGH (ref 25.1–34.0)
MCHC: 34.2 g/dL (ref 31.5–36.0)
MCV: 101.6 fL — AB (ref 79.5–101.0)
MONO#: 0.4 10*3/uL (ref 0.1–0.9)
MONO%: 8.1 % (ref 0.0–14.0)
NEUT#: 3.7 10*3/uL (ref 1.5–6.5)
NEUT%: 73.4 % (ref 38.4–76.8)
Platelets: 166 10*3/uL (ref 145–400)
RBC: 2.65 10*6/uL — AB (ref 3.70–5.45)
RDW: 13.7 % (ref 11.2–14.5)
WBC: 5 10*3/uL (ref 3.9–10.3)
lymph#: 0.8 10*3/uL — ABNORMAL LOW (ref 0.9–3.3)

## 2013-08-22 MED ORDER — HEPARIN SOD (PORK) LOCK FLUSH 100 UNIT/ML IV SOLN
500.0000 [IU] | Freq: Once | INTRAVENOUS | Status: AC
  Administered 2013-08-22: 500 [IU] via INTRAVENOUS
  Filled 2013-08-22: qty 5

## 2013-08-22 MED ORDER — SODIUM CHLORIDE 0.9 % IJ SOLN
10.0000 mL | INTRAMUSCULAR | Status: DC | PRN
  Administered 2013-08-22: 10 mL via INTRAVENOUS
  Filled 2013-08-22: qty 10

## 2013-08-22 NOTE — Patient Instructions (Signed)

## 2013-08-22 NOTE — Telephone Encounter (Signed)
gv and printed appt sched adn avs for pt for 4.3...adjusted tx.

## 2013-08-22 NOTE — Progress Notes (Signed)
[facility] Telephone:([phone]) [phone]   Fax:(336) [phone]  OFFICE PROGRESS NOTE  [name], MD [address] [place] [address]  DIAGNOSIS: Metastatic non-small cell lung cancer (T2 NX M1). Presented with right upper lobe lung mass as well as multiple pulmonary nodules and bone metastasis at L3 vertebral body. This was diagnosed in March of 2006.   PRIOR THERAPY:  1. Status post palliative radiotherapy to the L3 lesion under the care of Dr. Tammi Klippel completed September 17, 2004. 2. Status post 6 cycles of systemic chemotherapy with carboplatin and paclitaxel. Last dose was given January 01, 2005. 3. Status post palliative radiotherapy to the right upper lobe lung mass under the care of Dr. Tammi Klippel completed on 12/29/2010. 4. Status post treatment with Tarceva 150 mg p.o. daily started April 2007 through July 2012. 5. Tarceva 100 mg p.o. daily started July 2012. 6. Systemic chemotherapy with Carboplatin for AUC 5 and Alimta 500 mg/m2 given every 3 weeks. Status post 6 cycles. 7. Maintenance chemotherapy with single agent Alimta at 500 mg per meter squared given every 3 weeks, status post 1 cycle given on 07/30/2012.  CURRENT THERAPY:  1. Zometa 4 mg IV q. 3 months for bone metastasis. 2. Tarceva 100 mg by mouth daily resumed therapy on 01/07/13  CHEMOTHERAPY INTENT: Palliative  CURRENT # OF CHEMOTHERAPY CYCLES: 6 CURRENT ANTIEMETICS: Compazine  CURRENT SMOKING STATUS: Never Smoker  ORAL CHEMOTHERAPY AND CONSENT: yes  CURRENT BISPHOSPHONATES USE: Zometa every 3 months  PAIN MANAGEMENT: OxyContin  NARCOTICS INDUCED CONSTIPATION: yes but well managed with Senokot-S and as needed Dulcolax  LIVING WILL AND CODE STATUS: Full code  INTERVAL HISTORY: Jamie Munoz 80 y.o. female returns to the clinic today for followup visit. The patient is feeling fine today with no specific complaints except for occasional back pain and fatigue. She is tolerating her current  treatment with Tarceva at 100 mg by mouth daily fairly well with no significant adverse effects. The patient denied having any skin rash or diarrhea. She reports that her scalp lesions have actually improved. She continues to have mild fatigue. The patient denied having any chest pain, shortness of breath, cough or hemoptysis.  She had repeat CT scan of the chest, abdomen and pelvis performed recently and she is here for evaluation and discussion of her scan results. She receives Zometa infusions every 3 months for her bone disease, next due April 2015. She is due for a refill for her OxyContin 20 mg tablets and this has been provided to her.   MEDICAL HISTORY: Past Medical History  Diagnosis Date  . Cancer   . Lung cancer   . Hypertension   . Cellulitis of left lower extremity 07/16/2012    ALLERGIES:  is allergic to codeine.  MEDICATIONS:  Current Outpatient Prescriptions  Medication Sig Dispense Refill  . aspirin EC 81 MG tablet Take 81 mg by mouth daily.      . bisacodyl (DULCOLAX) 5 MG EC tablet Take 5 mg by mouth daily as needed for constipation.       . clindamycin (CLEOCIN T) 1 % lotion Apply 1 application topically 2 (two) times daily. Applied to face.      . FeFum-FePoly-FA-B Cmp-C-Biot (INTEGRA PLUS) CAPS Take 1 capsule by mouth every morning.  30 capsule  3  . gabapentin (NEURONTIN) 100 MG capsule TAKE (2) CAPSULES THREE TIMES DAILY.  180 capsule  1  . olmesartan (BENICAR) 20 MG tablet Take 20 mg  by mouth daily.        . OxyCODONE (OXYCONTIN) 20 mg T12A 12 hr tablet Take 1 tablet (20 mg total) by mouth at bedtime.  60 tablet  0  . prochlorperazine (COMPAZINE) 10 MG tablet Take 10 mg by mouth every 6 (six) hours as needed (for nausea.).      Marland Kitchen TARCEVA 100 MG tablet TAKE 1 TABLET BY MOUTH DAILY. TAKE ON AN EMPTY STOMACH 1 HOUR BEFORE MEALS OR 2 HOURS AFTER  30 tablet  1  . ALPRAZolam (XANAX) 0.25 MG tablet Take 0.25 mg by mouth 3 (three) times daily as needed for anxiety.       .  BESIVANCE 0.6 % SUSP       . clobetasol ointment (TEMOVATE) 9.76 % Apply 1 application topically daily.      Marland Kitchen docusate sodium (COLACE) 100 MG capsule Take 100 mg by mouth 2 (two) times daily as needed for constipation.       . lidocaine-prilocaine (EMLA) cream Apply 1 application topically as needed (for port-a-cath access.).       Marland Kitchen oxyCODONE-acetaminophen (PERCOCET/ROXICET) 5-325 MG per tablet       . polyethylene glycol (MIRALAX / GLYCOLAX) packet Take 17 g by mouth 2 (two) times daily.  14 each    . senna-docusate (SENOKOT-S) 8.6-50 MG per tablet Take 1 tablet by mouth at bedtime.      . temazepam (RESTORIL) 15 MG capsule TAKE 1 CAPSULE AT BEDTIME AS NEEDED FOR INSOMNIA  30 capsule  0   No current facility-administered medications for this visit.   Facility-Administered Medications Ordered in Other Visits  Medication Dose Route Frequency Provider Last Rate Last Dose  . influenza  inactive virus vaccine (FLUZONE/FLUARIX) injection 0.5 mL  0.5 mL Intramuscular Once Curt Bears, MD        SURGICAL HISTORY:  Past Surgical History  Procedure Laterality Date  . Cholecystectomy    . Abdominal hysterectomy    . Breast surgery    . Vaginal sling      REVIEW OF SYSTEMS:  Constitutional: positive for fatigue Eyes: negative Ears, nose, mouth, throat, and face: negative Respiratory: negative Cardiovascular: negative Gastrointestinal: negative Genitourinary:negative Integument/breast: negative Hematologic/lymphatic: negative Musculoskeletal:negative Neurological: negative Behavioral/Psych: negative Endocrine: negative Allergic/Immunologic: negative   PHYSICAL EXAMINATION: General appearance: alert, cooperative and no distress Head: Normocephalic, without obvious abnormality, atraumatic Neck: no adenopathy, no JVD, supple, symmetrical, trachea midline and thyroid not enlarged, symmetric, no tenderness/mass/nodules Lymph nodes: Cervical, supraclavicular, and axillary nodes  normal. Resp: clear to auscultation bilaterally Back: symmetric, no curvature. ROM normal. No CVA tenderness. Cardio: regular rate and rhythm, S1, S2 normal, no murmur, click, rub or gallop GI: soft, non-tender; bowel sounds normal; no masses,  no organomegaly Extremities: extremities normal, atraumatic, no cyanosis or edema Neurologic: Alert and oriented X 3, normal strength and tone. Normal symmetric reflexes. Normal coordination and gait  ECOG PERFORMANCE STATUS: 1 - Symptomatic but completely ambulatory  Blood pressure 138/57, pulse 69, temperature 98.3 F (36.8 C), temperature source Oral, resp. rate 19, height 5\' 6"  (1.676 m), weight 131 lb 14.4 oz (59.829 kg).  LABORATORY DATA: Lab Results  Component Value Date   WBC 5.0 08/22/2013   HGB 9.2* 08/22/2013   HCT 27.0* 08/22/2013   MCV 101.6* 08/22/2013   PLT 166 08/22/2013      Chemistry      Component Value Date/Time   NA 143 08/22/2013 0914   NA 141 08/18/2012 0450   NA 139 09/02/2011 1406  K 4.5 08/22/2013 0914   K 3.7 08/18/2012 0450   K 3.9 09/02/2011 1406   CL 105 10/02/2012 1056   CL 108 08/18/2012 0450   CL 97* 09/02/2011 1406   CO2 26 08/22/2013 0914   CO2 26 08/18/2012 0450   CO2 29 09/02/2011 1406   BUN 26.6* 08/22/2013 0914   BUN 17 08/18/2012 0450   BUN 18 09/02/2011 1406   CREATININE 1.4* 08/22/2013 0914   CREATININE 1.08 08/18/2012 0450   CREATININE 1.2 09/02/2011 1406      Component Value Date/Time   CALCIUM 8.8 08/22/2013 0914   CALCIUM 7.6* 08/18/2012 0450   CALCIUM 8.0 09/02/2011 1406   ALKPHOS 52 08/22/2013 0914   ALKPHOS 97 08/15/2012 0340   ALKPHOS 71 09/02/2011 1406   AST 19 08/22/2013 0914   AST 31 08/15/2012 0340   AST 25 09/02/2011 1406   ALT 10 08/22/2013 0914   ALT 18 08/15/2012 0340   ALT 20 09/02/2011 1406   BILITOT 0.59 08/22/2013 0914   BILITOT 0.4 08/15/2012 0340   BILITOT 0.60 09/02/2011 1406       RADIOGRAPHIC STUDIES: Ct Chest Wo Contrast  07/25/2013   CLINICAL DATA:  Lung cancer, chemotherapy complete. Restaging exam   EXAM: CT CHEST, ABDOMEN AND PELVIS WITHOUT CONTRAST  TECHNIQUE: Multidetector CT imaging of the chest, abdomen and pelvis was performed following the standard protocol without IV contrast.  COMPARISON:  CT ABD/PELVIS W CM dated 04/17/2013;   CT ABD/PELVIS W CM dated 01/01/2013  FINDINGS:   CT CHEST FINDINGS  The right upper lobe mass is increased in volume measuring 3.8 x 3.4 cm compared to 3.4 x 3.2 cm. No new pulmonary nodules or masses.  No axillary or supraclavicular lymphadenopathy. There is a port in the right chest wall. No mediastinal lymphadenopathy. No pericardial fluid. Esophagus is normal.    CT ABDOMEN AND PELVIS FINDINGS  Non IV contrast images demonstrate no focal hepatic lesion. The gallbladder is surgically absent. Pancreas, spleen, adrenal glands, and kidneys are normal.  The stomach, small bowel, cecum are normal. There are diverticula of the sigmoid colon.  Abdominal aorta is normal caliber. No retroperitoneal periportal lymphadenopathy.  No free fluid the pelvis. Post hysterectomy anatomy. No pelvic lymphadenopathy. Rounded sclerotic lesion measuring 14 mm at L1 is unchanged. Sclerotic lesion within the left aspect of the inferior sternum is also unchanged. Compression fracture at L3 with augmentation.    IMPRESSION: 1. Mild interval enlargement of right upper lobe mass. 2. Stable sclerotic metastasis at L1 and within the sternum. 3. No evidence of mediastinal metastasis.   Electronically Signed   By: Suzy Bouchard M.D.   On: 07/25/2013 08:42   ASSESSMENT AND PLAN:  This is a very pleasant 80 years old white female with metastatic non-small cell lung cancer diagnosed in March 2006 and has been on treatment with several chemotherapy regimen including almost 5 years of Tarceva 150 mg by mouth daily and she is currently on Tarceva 100 mg by mouth daily and tolerating it fairly well with no significant skin rash or diarrhea. She status post 6 months of treatment. Her recent scan showed  stable disease except for minimal increase in the size of the right upper lobe mass. Her laboratory studies are within normal ranges, and she is tolerating the Tarceva relatively well. She will continue on Tarceva 100 mg by mouth daily. She'll also continue on OxyContin 20 mg by mouth every 12 hours, although the patient primarily takes this at night there  are some days when she doesn't need it twice a day. Of note a prescription for her OxyContin was written on 08/19/2013 and mailed to her and received the 08/22/2013. For the persistent anemia, she will continue on Integra plus  capsule by mouth daily. She will followup i with Dr. Julien Nordmann in one month's for reevaluation and management any adverse effect of her treatment. She will also be due for her Zometa infusion at that visit.  She was advised to call immediately if she has any concerning symptoms in the interval  The patient voices understanding of current disease status and treatment options and is in agreement with the current care plan.  All questions were answered. The patient knows to call the clinic with any problems, questions or concerns. We can certainly see the patient much sooner if necessary.  Carlton Adam, PA-C   Disclaimer: This note was dictated with voice recognition software. Similar sounding words can inadvertently be transcribed and may not be corrected upon review.

## 2013-08-26 NOTE — Patient Instructions (Signed)
Continue taking Tarceva  100 mg by mouth daily Continue taking Integra +1 capsule by mouth daily Followup in one month for another symptom management visit, and your next Zometa infusion

## 2013-09-05 ENCOUNTER — Other Ambulatory Visit: Payer: Self-pay | Admitting: Internal Medicine

## 2013-09-05 DIAGNOSIS — C3490 Malignant neoplasm of unspecified part of unspecified bronchus or lung: Secondary | ICD-10-CM

## 2013-09-09 ENCOUNTER — Other Ambulatory Visit: Payer: Self-pay | Admitting: Internal Medicine

## 2013-09-09 ENCOUNTER — Other Ambulatory Visit: Payer: Self-pay | Admitting: Physician Assistant

## 2013-09-09 DIAGNOSIS — C3490 Malignant neoplasm of unspecified part of unspecified bronchus or lung: Secondary | ICD-10-CM

## 2013-09-18 ENCOUNTER — Telehealth: Payer: Self-pay | Admitting: Internal Medicine

## 2013-09-18 NOTE — Telephone Encounter (Signed)
returned pt call and lvm for pt to call back with d.t the she would like to r/s appt due o falling and breaking shoulder

## 2013-09-19 ENCOUNTER — Telehealth: Payer: Self-pay | Admitting: *Deleted

## 2013-09-19 ENCOUNTER — Other Ambulatory Visit: Payer: Self-pay | Admitting: Pharmacist

## 2013-09-19 NOTE — Telephone Encounter (Signed)
Pt called stating that she broker her shoulder and she will not be able to come to her appts tomorrow (lab/ AJ/ zometa).  Her doctor that she saw at urgent care has not given her any pain medication.  She wanted to see if she could take her oxycontin BID versus just daily.  Spoke to [employer], okay to start taking oxycontin twice daily.  If she is unable to get any short acting pain medication from her doctor taking care of her broken shoulder she can let us know and we will supply that for her as well.  Pt verbalized understanding.  Pt will call back when she can to r/s her appts.  SLJ

## 2013-09-20 ENCOUNTER — Ambulatory Visit: Payer: Medicare Other

## 2013-09-20 ENCOUNTER — Other Ambulatory Visit: Payer: Medicare Other

## 2013-09-20 ENCOUNTER — Ambulatory Visit: Payer: Medicare Other | Admitting: Physician Assistant

## 2013-09-20 ENCOUNTER — Telehealth: Payer: Self-pay | Admitting: *Deleted

## 2013-09-20 DIAGNOSIS — S4290XA Fracture of unspecified shoulder girdle, part unspecified, initial encounter for closed fracture: Secondary | ICD-10-CM

## 2013-09-20 MED ORDER — TRAMADOL HCL 50 MG PO TABS: 50.0000 mg | ORAL_TABLET | Freq: Four times a day (QID) | ORAL | Status: DC | PRN

## 2013-09-20 NOTE — Telephone Encounter (Signed)
Pt's Niece Hope called stating that pt is still having issues with pain control.  She has started the q12 oxycontin but it is not really helping yet.  Hope is not on pt's HIPPA release form, had pt come on the phone to give this RN permission to speak to [facility].  Pt granted permission.  Informed Hope that per AJ, okay to call in Ultram rx for pt and it may take a few days for oxytonin to really being working effectively.  Hope verbalized understanding.  SLJ

## 2013-09-23 ENCOUNTER — Other Ambulatory Visit: Payer: Self-pay

## 2013-09-24 ENCOUNTER — Telehealth: Payer: Self-pay | Admitting: *Deleted

## 2013-09-24 NOTE — Telephone Encounter (Signed)
Received call from pt's niece Hope (permission given from pt to speak to neice).  Pt was started on Ultram on 09/20/13 By Awilda Metro due to her orthopedic MD being closed for the holiday weekend.  She was also advised to increase her oxycontin to BID.  As of today, her pain is not well controlled and she has become intermittently confused.  Hope called her orthopedic MD and they said Dr Vista Mink should oversee her pain medication.  Spoke to Sebastian, due to pt's acute fracture not being oncology related, she feels that her orthopedic MD should oversee her fracture to help with pain management.  Called and spoke to pt's orthopedic MD office (Dr Archie Endo).  Spoke to [place], she states she will inform Dr Durward Fortes of our request for them to oversee her fractured shoulder until her shoulder has healed.  Called and informed Hope.  SLJ

## 2013-09-30 ENCOUNTER — Other Ambulatory Visit: Payer: Self-pay | Admitting: Physician Assistant

## 2013-09-30 DIAGNOSIS — C3490 Malignant neoplasm of unspecified part of unspecified bronchus or lung: Secondary | ICD-10-CM

## 2013-10-01 NOTE — Telephone Encounter (Signed)
Called patient to inquire about orthopedic provider.  "Scheduled for Ortho f/u next Wednesday but I have not heard from their office the past week"  Verbal order received and read back from Ms. Johnson PA-C to fill both pain medications.

## 2013-10-03 ENCOUNTER — Other Ambulatory Visit: Payer: Self-pay | Admitting: Medical Oncology

## 2013-10-03 ENCOUNTER — Telehealth: Payer: Self-pay | Admitting: Medical Oncology

## 2013-10-03 NOTE — Telephone Encounter (Signed)
Pt notified to pick up rx at pharmacy

## 2013-10-03 NOTE — Telephone Encounter (Signed)
Mailed tramadol to pharmacy and faxed rx.

## 2013-10-04 ENCOUNTER — Other Ambulatory Visit: Payer: Self-pay | Admitting: Internal Medicine

## 2013-10-07 ENCOUNTER — Other Ambulatory Visit: Payer: Self-pay | Admitting: *Deleted

## 2013-10-11 ENCOUNTER — Other Ambulatory Visit: Payer: Self-pay | Admitting: Internal Medicine

## 2013-10-14 ENCOUNTER — Ambulatory Visit: Payer: Medicare Other | Attending: Orthopaedic Surgery | Admitting: Physical Therapy

## 2013-10-14 DIAGNOSIS — M25619 Stiffness of unspecified shoulder, not elsewhere classified: Secondary | ICD-10-CM | POA: Diagnosis not present

## 2013-10-14 DIAGNOSIS — IMO0001 Reserved for inherently not codable concepts without codable children: Secondary | ICD-10-CM | POA: Insufficient documentation

## 2013-10-14 DIAGNOSIS — R5381 Other malaise: Secondary | ICD-10-CM | POA: Diagnosis not present

## 2013-10-14 DIAGNOSIS — S42309D Unspecified fracture of shaft of humerus, unspecified arm, subsequent encounter for fracture with routine healing: Secondary | ICD-10-CM | POA: Insufficient documentation

## 2013-10-14 DIAGNOSIS — M25519 Pain in unspecified shoulder: Secondary | ICD-10-CM | POA: Insufficient documentation

## 2013-10-16 ENCOUNTER — Ambulatory Visit: Payer: Medicare Other | Admitting: Physical Therapy

## 2013-10-16 DIAGNOSIS — IMO0001 Reserved for inherently not codable concepts without codable children: Secondary | ICD-10-CM | POA: Diagnosis not present

## 2013-10-22 ENCOUNTER — Ambulatory Visit: Payer: Medicare Other | Attending: Orthopaedic Surgery | Admitting: Physical Therapy

## 2013-10-22 DIAGNOSIS — S42309D Unspecified fracture of shaft of humerus, unspecified arm, subsequent encounter for fracture with routine healing: Secondary | ICD-10-CM | POA: Insufficient documentation

## 2013-10-22 DIAGNOSIS — M25519 Pain in unspecified shoulder: Secondary | ICD-10-CM | POA: Diagnosis not present

## 2013-10-22 DIAGNOSIS — M25619 Stiffness of unspecified shoulder, not elsewhere classified: Secondary | ICD-10-CM | POA: Diagnosis not present

## 2013-10-22 DIAGNOSIS — IMO0001 Reserved for inherently not codable concepts without codable children: Secondary | ICD-10-CM | POA: Insufficient documentation

## 2013-10-22 DIAGNOSIS — R5381 Other malaise: Secondary | ICD-10-CM | POA: Diagnosis not present

## 2013-10-23 ENCOUNTER — Other Ambulatory Visit (HOSPITAL_COMMUNITY): Payer: Self-pay | Admitting: Orthopaedic Surgery

## 2013-10-23 ENCOUNTER — Telehealth: Payer: Self-pay | Admitting: Internal Medicine

## 2013-10-23 DIAGNOSIS — M810 Age-related osteoporosis without current pathological fracture: Secondary | ICD-10-CM

## 2013-10-23 NOTE — Telephone Encounter (Signed)
pt called to r/s missed appt...done...pt needed morning appt...done...pt ok and aware

## 2013-10-24 ENCOUNTER — Ambulatory Visit: Payer: Medicare Other | Admitting: Physical Therapy

## 2013-10-24 DIAGNOSIS — IMO0001 Reserved for inherently not codable concepts without codable children: Secondary | ICD-10-CM | POA: Diagnosis not present

## 2013-10-29 ENCOUNTER — Ambulatory Visit: Payer: Medicare Other | Admitting: Physical Therapy

## 2013-10-29 DIAGNOSIS — IMO0001 Reserved for inherently not codable concepts without codable children: Secondary | ICD-10-CM | POA: Diagnosis not present

## 2013-10-30 ENCOUNTER — Ambulatory Visit (HOSPITAL_COMMUNITY)
Admission: RE | Admit: 2013-10-30 | Discharge: 2013-10-30 | Disposition: A | Discharge status: Home / Self Care | Payer: Medicare Other | Source: Ambulatory Visit | Attending: Orthopaedic Surgery | Admitting: Orthopaedic Surgery

## 2013-10-30 DIAGNOSIS — M810 Age-related osteoporosis without current pathological fracture: Secondary | ICD-10-CM | POA: Insufficient documentation

## 2013-10-31 ENCOUNTER — Encounter: Payer: Medicare Other | Admitting: Physical Therapy

## 2013-11-01 ENCOUNTER — Telehealth: Payer: Self-pay | Admitting: Internal Medicine

## 2013-11-01 ENCOUNTER — Ambulatory Visit (HOSPITAL_BASED_OUTPATIENT_CLINIC_OR_DEPARTMENT_OTHER): Payer: Medicare Other | Admitting: Physician Assistant

## 2013-11-01 ENCOUNTER — Encounter: Payer: Self-pay | Admitting: Physician Assistant

## 2013-11-01 ENCOUNTER — Ambulatory Visit (HOSPITAL_BASED_OUTPATIENT_CLINIC_OR_DEPARTMENT_OTHER): Payer: Medicare Other

## 2013-11-01 ENCOUNTER — Other Ambulatory Visit: Payer: Self-pay

## 2013-11-01 VITALS — BP 132/58 | HR 77 | Temp 97.7°F | Resp 18 | Ht 66.0 in | Wt 130.7 lb

## 2013-11-01 DIAGNOSIS — C7952 Secondary malignant neoplasm of bone marrow: Secondary | ICD-10-CM

## 2013-11-01 DIAGNOSIS — C3490 Malignant neoplasm of unspecified part of unspecified bronchus or lung: Secondary | ICD-10-CM

## 2013-11-01 DIAGNOSIS — C7951 Secondary malignant neoplasm of bone: Secondary | ICD-10-CM

## 2013-11-01 DIAGNOSIS — C3410 Malignant neoplasm of upper lobe, unspecified bronchus or lung: Secondary | ICD-10-CM

## 2013-11-01 DIAGNOSIS — D649 Anemia, unspecified: Secondary | ICD-10-CM

## 2013-11-01 DIAGNOSIS — Z452 Encounter for adjustment and management of vascular access device: Secondary | ICD-10-CM

## 2013-11-01 LAB — COMPREHENSIVE METABOLIC PANEL (CC13)
ALT: 10 U/L (ref 0–55)
ANION GAP: 11 meq/L (ref 3–11)
AST: 19 U/L (ref 5–34)
Albumin: 3.6 g/dL (ref 3.5–5.0)
Alkaline Phosphatase: 85 U/L (ref 40–150)
BILIRUBIN TOTAL: 0.86 mg/dL (ref 0.20–1.20)
BUN: 28.3 mg/dL — ABNORMAL HIGH (ref 7.0–26.0)
CALCIUM: 8.9 mg/dL (ref 8.4–10.4)
CHLORIDE: 106 meq/L (ref 98–109)
CO2: 22 meq/L (ref 22–29)
CREATININE: 1.7 mg/dL — AB (ref 0.6–1.1)
Glucose: 92 mg/dl (ref 70–140)
Potassium: 4.1 mEq/L (ref 3.5–5.1)
Sodium: 139 mEq/L (ref 136–145)
Total Protein: 6.5 g/dL (ref 6.4–8.3)

## 2013-11-01 LAB — CBC WITH DIFFERENTIAL/PLATELET
BASO%: 0.3 % (ref 0.0–2.0)
Basophils Absolute: 0 10*3/uL (ref 0.0–0.1)
EOS ABS: 0.2 10*3/uL (ref 0.0–0.5)
EOS%: 2.3 % (ref 0.0–7.0)
HEMATOCRIT: 29.1 % — AB (ref 34.8–46.6)
HGB: 9.6 g/dL — ABNORMAL LOW (ref 11.6–15.9)
LYMPH#: 1.1 10*3/uL (ref 0.9–3.3)
LYMPH%: 16 % (ref 14.0–49.7)
MCH: 32.9 pg (ref 25.1–34.0)
MCHC: 33 g/dL (ref 31.5–36.0)
MCV: 99.9 fL (ref 79.5–101.0)
MONO#: 0.6 10*3/uL (ref 0.1–0.9)
MONO%: 9.5 % (ref 0.0–14.0)
NEUT%: 71.9 % (ref 38.4–76.8)
NEUTROS ABS: 4.8 10*3/uL (ref 1.5–6.5)
Platelets: 217 10*3/uL (ref 145–400)
RBC: 2.92 10*6/uL — ABNORMAL LOW (ref 3.70–5.45)
RDW: 13.8 % (ref 11.2–14.5)
WBC: 6.7 10*3/uL (ref 3.9–10.3)

## 2013-11-01 MED ORDER — SODIUM CHLORIDE 0.9 % IJ SOLN
10.0000 mL | INTRAMUSCULAR | Status: AC | PRN
  Administered 2013-11-01: 10 mL via INTRAVENOUS
  Filled 2013-11-01: qty 10

## 2013-11-01 MED ORDER — HEPARIN SOD (PORK) LOCK FLUSH 100 UNIT/ML IV SOLN
500.0000 [IU] | Freq: Once | INTRAVENOUS | Status: AC
Start: 2013-11-01 — End: 2013-11-01
  Administered 2013-11-01: 500 [IU] via INTRAVENOUS
  Filled 2013-11-01: qty 5

## 2013-11-01 MED ORDER — DENOSUMAB 120 MG/1.7ML ~~LOC~~ SOLN
120.0000 mg | Freq: Once | SUBCUTANEOUS | Status: AC
  Administered 2013-11-01: 120 mg via SUBCUTANEOUS
  Filled 2013-11-01: qty 1.7

## 2013-11-01 MED ORDER — SODIUM CHLORIDE 0.9 % IJ SOLN
10.0000 mL | INTRAMUSCULAR | Status: DC | PRN
  Administered 2013-11-01: 10 mL via INTRAVENOUS
  Filled 2013-11-01: qty 10

## 2013-11-01 NOTE — Telephone Encounter (Signed)
gv adn prnted appt sched and avs for pt for May thru Sept

## 2013-11-01 NOTE — Progress Notes (Addendum)
[facility] [phone]   [phone]  OFFICE PROGRESS NOTE  [name], MD [address] [name] [address]  DIAGNOSIS: Metastatic non-small cell lung cancer (T2 NX M1). Presented with right upper lobe lung mass as well as multiple pulmonary nodules and bone metastasis at L3 vertebral body. This was diagnosed in March of 2006.   PRIOR THERAPY:  1. Status post palliative radiotherapy to the L3 lesion under the care of Dr. Tammi Klippel completed September 17, 2004. 2. Status post 6 cycles of systemic chemotherapy with carboplatin and paclitaxel. Last dose was given January 01, 2005. 3. Status post palliative radiotherapy to the right upper lobe lung mass under the care of Dr. Tammi Klippel completed on 12/29/2010. 4. Status post treatment with Tarceva 150 mg p.o. daily started April 2007 through July 2012. 5. Tarceva 100 mg p.o. daily started July 2012. 6. Systemic chemotherapy with Carboplatin for AUC 5 and Alimta 500 mg/m2 given every 3 weeks. Status post 6 cycles. 7. Maintenance chemotherapy with single agent Alimta at 500 mg per meter squared given every 3 weeks, status post 1 cycle given on 07/30/2012.  CURRENT THERAPY:  1. Zometa 4 mg IV q. 3 months for bone metastasis. 2. Tarceva 100 mg by mouth daily resumed therapy on 01/07/13  CHEMOTHERAPY INTENT: Palliative  CURRENT # OF CHEMOTHERAPY CYCLES: 8 CURRENT ANTIEMETICS: Compazine  CURRENT SMOKING STATUS: Never Smoker  ORAL CHEMOTHERAPY AND CONSENT: yes  CURRENT BISPHOSPHONATES USE: Zometa every 3 months  PAIN MANAGEMENT: OxyContin  NARCOTICS INDUCED CONSTIPATION: yes but well managed with Senokot-S and as needed Dulcolax  LIVING WILL AND CODE STATUS: Full code  INTERVAL HISTORY: Jamie Munoz 80 y.o. female returns to the clinic today for followup visit. The patient is feeling fine today with no specific complaints except for occasional back pain and fatigue. She is tolerating her current  treatment with Tarceva at 100 mg by mouth daily fairly well with no significant adverse effects. The patient denied having any skin rash or diarrhea. She reports that her scalp lesions are currently not an issue. She continues to have mild fatigue. The patient denied having any chest pain, shortness of breath, cough or hemoptysis. She is still recovering from her right shoulder fracture. She is currently undergoing some physical therapy. Due to concerns about osteonecrosis and renal insufficiency the plan is to change the patient from Zometa to [facility] for her history of bone metastasis.    MEDICAL HISTORY: Past Medical History  Diagnosis Date  . Cancer   . Lung cancer   . Hypertension   . Cellulitis of left lower extremity 07/16/2012    ALLERGIES:  is allergic to codeine.  MEDICATIONS:  Current Outpatient Prescriptions  Medication Sig Dispense Refill  . olmesartan (BENICAR) 20 MG tablet Take 20 mg by mouth daily.        . OxyCODONE (OXYCONTIN) 20 mg T12A 12 hr tablet Take 1 tablet (20 mg total) by mouth every 12 (twelve) hours.  60 tablet  0  . polyethylene glycol (MIRALAX / GLYCOLAX) packet Take 17 g by mouth 2 (two) times daily.  14 each    . senna-docusate (SENOKOT-S) 8.6-50 MG per tablet Take 1 tablet by mouth at bedtime.      . ALPRAZolam (XANAX) 0.25 MG tablet Take 0.25 mg by mouth 3 (three) times daily as needed for anxiety.       Marland Kitchen aspirin EC 81 MG tablet Take 81 mg by mouth daily.      Marland Kitchen  BESIVANCE 0.6 % SUSP       . bisacodyl (DULCOLAX) 5 MG EC tablet Take 5 mg by mouth daily as needed for constipation.       . clindamycin (CLEOCIN T) 1 % lotion Apply 1 application topically 2 (two) times daily. Applied to face.      . clobetasol ointment (TEMOVATE) 8.67 % Apply 1 application topically daily.      Marland Kitchen docusate sodium (COLACE) 100 MG capsule Take 100 mg by mouth 2 (two) times daily as needed for constipation.       . FeFum-FePoly-FA-B Cmp-C-Biot (INTEGRA PLUS) CAPS Take 1 capsule by  mouth every morning.  30 capsule  3  . gabapentin (NEURONTIN) 100 MG capsule TAKE (2) CAPSULES THREE TIMES DAILY.  180 capsule  1  . lidocaine-prilocaine (EMLA) cream Apply 1 application topically as needed (for port-a-cath access.).       Marland Kitchen oxyCODONE-acetaminophen (PERCOCET/ROXICET) 5-325 MG per tablet       . prochlorperazine (COMPAZINE) 10 MG tablet Take 10 mg by mouth every 6 (six) hours as needed (for nausea.).      Marland Kitchen TARCEVA 100 MG tablet TAKE 1 TABLET BY MOUTH DAILY. TAKE ON AN EMPTY STOMACH 1 HOUR BEFORE MEALS OR 2 HOURS AFTER  30 tablet  0  . temazepam (RESTORIL) 15 MG capsule TAKE 1 CAPSULE AT BEDTIME AS NEEDED FOR INSOMNIA  30 capsule  0  . traMADol (ULTRAM) 50 MG tablet TAKE (1) TABLET EVERY SIX HOURS AS NEEDED FOR PAIN.    - MAY MAKE DROWSY -  30 tablet  0   No current facility-administered medications for this visit.   Facility-Administered Medications Ordered in Other Visits  Medication Dose Route Frequency Provider Last Rate Last Dose  . influenza  inactive virus vaccine (FLUZONE/FLUARIX) injection 0.5 mL  0.5 mL Intramuscular Once Curt Bears, MD      . sodium chloride 0.9 % injection 10 mL  10 mL Intravenous PRN Curt Bears, MD   10 mL at 11/01/13 0948  . sodium chloride 0.9 % injection 10 mL  10 mL Intravenous PRN Curt Bears, MD   10 mL at 11/01/13 1206    SURGICAL HISTORY:  Past Surgical History  Procedure Laterality Date  . Cholecystectomy    . Abdominal hysterectomy    . Breast surgery    . Vaginal sling      REVIEW OF SYSTEMS:  Constitutional: positive for fatigue Eyes: negative Ears, nose, mouth, throat, and face: negative Respiratory: negative Cardiovascular: negative Gastrointestinal: negative Genitourinary:negative Integument/breast: negative Hematologic/lymphatic: negative Musculoskeletal:positive for right shoulder pain and stiffness related to recent fracture Neurological: negative Behavioral/Psych: negative Endocrine:  negative Allergic/Immunologic: negative   PHYSICAL EXAMINATION: General appearance: alert, cooperative and no distress Head: Normocephalic, without obvious abnormality, atraumatic Neck: no adenopathy, no JVD, supple, symmetrical, trachea midline and thyroid not enlarged, symmetric, no tenderness/mass/nodules Lymph nodes: Cervical, supraclavicular, and axillary nodes normal. Resp: clear to auscultation bilaterally Back: symmetric, no curvature. ROM normal. No CVA tenderness. Cardio: regular rate and rhythm, S1, S2 normal, no murmur, click, rub or gallop GI: soft, non-tender; bowel sounds normal; no masses,  no organomegaly Extremities: extremities normal, atraumatic, no cyanosis or edema Neurologic: Alert and oriented X 3, normal strength and tone. Normal symmetric reflexes. Normal coordination and gait  ECOG PERFORMANCE STATUS: 1 - Symptomatic but completely ambulatory  Blood pressure 132/58, pulse 77, temperature 97.7 F (36.5 C), temperature source Oral, resp. rate 18, height 5\' 6"  (1.676 m), weight 130 lb 11.2 oz (59.285  kg), SpO2 97.00%.  LABORATORY DATA: Lab Results  Component Value Date   WBC 6.7 11/01/2013   HGB 9.6* 11/01/2013   HCT 29.1* 11/01/2013   MCV 99.9 11/01/2013   PLT 217 11/01/2013      Chemistry      Component Value Date/Time   NA 139 11/01/2013 0938   NA 141 08/18/2012 0450   NA 139 09/02/2011 1406   K 4.1 11/01/2013 0938   K 3.7 08/18/2012 0450   K 3.9 09/02/2011 1406   CL 105 10/02/2012 1056   CL 108 08/18/2012 0450   CL 97* 09/02/2011 1406   CO2 22 11/01/2013 0938   CO2 26 08/18/2012 0450   CO2 29 09/02/2011 1406   BUN 28.3* 11/01/2013 0938   BUN 17 08/18/2012 0450   BUN 18 09/02/2011 1406   CREATININE 1.7* 11/01/2013 0938   CREATININE 1.08 08/18/2012 0450   CREATININE 1.2 09/02/2011 1406      Component Value Date/Time   CALCIUM 8.9 11/01/2013 0938   CALCIUM 7.6* 08/18/2012 0450   CALCIUM 8.0 09/02/2011 1406   ALKPHOS 85 11/01/2013 0938   ALKPHOS 97 08/15/2012 0340    ALKPHOS 71 09/02/2011 1406   AST 19 11/01/2013 0938   AST 31 08/15/2012 0340   AST 25 09/02/2011 1406   ALT 10 11/01/2013 0938   ALT 18 08/15/2012 0340   ALT 20 09/02/2011 1406   BILITOT 0.86 11/01/2013 0938   BILITOT 0.4 08/15/2012 0340   BILITOT 0.60 09/02/2011 1406       RADIOGRAPHIC STUDIES: Ct Chest Wo Contrast  07/25/2013   CLINICAL DATA:  Lung cancer, chemotherapy complete. Restaging exam  EXAM: CT CHEST, ABDOMEN AND PELVIS WITHOUT CONTRAST  TECHNIQUE: Multidetector CT imaging of the chest, abdomen and pelvis was performed following the standard protocol without IV contrast.  COMPARISON:  CT ABD/PELVIS W CM dated 04/17/2013;   CT ABD/PELVIS W CM dated 01/01/2013  FINDINGS:   CT CHEST FINDINGS  The right upper lobe mass is increased in volume measuring 3.8 x 3.4 cm compared to 3.4 x 3.2 cm. No new pulmonary nodules or masses.  No axillary or supraclavicular lymphadenopathy. There is a port in the right chest wall. No mediastinal lymphadenopathy. No pericardial fluid. Esophagus is normal.    CT ABDOMEN AND PELVIS FINDINGS  Non IV contrast images demonstrate no focal hepatic lesion. The gallbladder is surgically absent. Pancreas, spleen, adrenal glands, and kidneys are normal.  The stomach, small bowel, cecum are normal. There are diverticula of the sigmoid colon.  Abdominal aorta is normal caliber. No retroperitoneal periportal lymphadenopathy.  No free fluid the pelvis. Post hysterectomy anatomy. No pelvic lymphadenopathy. Rounded sclerotic lesion measuring 14 mm at L1 is unchanged. Sclerotic lesion within the left aspect of the inferior sternum is also unchanged. Compression fracture at L3 with augmentation.    IMPRESSION: 1. Mild interval enlargement of right upper lobe mass. 2. Stable sclerotic metastasis at L1 and within the sternum. 3. No evidence of mediastinal metastasis.   Electronically Signed   By: Suzy Bouchard M.D.   On: 07/25/2013 08:42   ASSESSMENT AND PLAN:  This is a very  pleasant 80 years old white female with metastatic non-small cell lung cancer diagnosed in March 2006 and has been on treatment with several chemotherapy regimen including almost 5 years of Tarceva 150 mg by mouth daily and she is currently on Tarceva 100 mg by mouth daily and tolerating it fairly well with no significant skin rash or diarrhea. She  status post 8 months of treatment. She missed her monthly followup appointment last month (April ) due to the shoulder fracture. Her recent scan showed stable disease except for minimal increase in the size of the right upper lobe mass. Her laboratory studies are within normal ranges, and she is tolerating the Tarceva relatively well. She will continue on Tarceva 100 mg by mouth daily. She'll also continue on OxyContin 20 mg by mouth every 12 hours, although the patient primarily takes this at night there are some days when she doesn't need it twice a day.  For the persistent anemia, she will continue on Integra plus  capsule by mouth daily. She will followup i with Dr. Julien Nordmann in one month's for reevaluation and management any adverse effect of her treatment. She will start  Xgeva injections for bone metastasis today and this will be given on a every 2 month basis. She was advised to call immediately if she has any concerning symptoms in the interval  The patient voices understanding of current disease status and treatment options and is in agreement with the current care plan.  All questions were answered. The patient knows to call the clinic with any problems, questions or concerns. We can certainly see the patient much sooner if necessary.  Carlton Adam, PA-C  ADDENDUM: Hematology/Oncology Attending: I had a face to face encounter with the patient. I recommended for her care plan. This is a very pleasant 80 years old white female with metastatic non-small cell lung cancer diagnosed in March of 2006 and currently on treatment with Tarceva 100 mg by  mouth daily and tolerating it fairly well status post 8 months. She recently had fracture of the right shoulder after fall at home. She is tolerating her Tarceva with no significant adverse effects. I recommended for the patient to continue her current treatment with Tarceva 100 mg by mouth daily. I would see her back for follow up visit in one month after repeating CT scan of the chest, abdomen and pelvis for restaging of her disease.  She will continue treatment with Xgeva for the metastatic bone disease. The patient was advised to call immediately if she has any concerning symptoms in the interval.  Disclaimer: This note was dictated with voice recognition software. Similar sounding words can inadvertently be transcribed and may not be corrected upon review. Curt Bears, MD 11/05/2013

## 2013-11-01 NOTE — Patient Instructions (Signed)

## 2013-11-01 NOTE — Progress Notes (Signed)
1105 Per Awilda Metro, PA ok to treat today with today's creatinine results.

## 2013-11-01 NOTE — Patient Instructions (Signed)
Denosumab injection  What is this medicine?  DENOSUMAB (den oh sue mab) slows bone breakdown. Prolia is used to treat osteoporosis in women after menopause and in men. Xgeva is used to prevent bone fractures and other bone problems caused by cancer bone metastases. Xgeva is also used to treat giant cell tumor of the bone.  This medicine may be used for other purposes; ask your health care provider or pharmacist if you have questions.  COMMON BRAND NAME(S): Prolia, XGEVA  What should I tell my health care provider before I take this medicine?  They need to know if you have any of these conditions:  -dental disease  -eczema  -infection or history of infections  -kidney disease or on dialysis  -low blood calcium or vitamin D  -malabsorption syndrome  -scheduled to have surgery or tooth extraction  -taking medicine that contains denosumab  -thyroid or parathyroid disease  -an unusual reaction to denosumab, other medicines, foods, dyes, or preservatives  -pregnant or trying to get pregnant  -breast-feeding  How should I use this medicine?  This medicine is for injection under the skin. It is given by a health care professional in a hospital or clinic setting.  If you are getting Prolia, a special MedGuide will be given to you by the pharmacist with each prescription and refill. Be sure to read this information carefully each time.  For Prolia, talk to your pediatrician regarding the use of this medicine in children. Special care may be needed. For Xgeva, talk to your pediatrician regarding the use of this medicine in children. While this drug may be prescribed for children as young as 13 years for selected conditions, precautions do apply.  Overdosage: If you think you've taken too much of this medicine contact a poison control center or emergency room at once.  Overdosage: If you think you have taken too much of this medicine contact a poison control center or emergency room at once.  NOTE: This medicine is only for  you. Do not share this medicine with others.  What if I miss a dose?  It is important not to miss your dose. Call your doctor or health care professional if you are unable to keep an appointment.  What may interact with this medicine?  Do not take this medicine with any of the following medications:  -other medicines containing denosumab  This medicine may also interact with the following medications:  -medicines that suppress the immune system  -medicines that treat cancer  -steroid medicines like prednisone or cortisone  This list may not describe all possible interactions. Give your health care provider a list of all the medicines, herbs, non-prescription drugs, or dietary supplements you use. Also tell them if you smoke, drink alcohol, or use illegal drugs. Some items may interact with your medicine.  What should I watch for while using this medicine?  Visit your doctor or health care professional for regular checks on your progress. Your doctor or health care professional may order blood tests and other tests to see how you are doing.  Call your doctor or health care professional if you get a cold or other infection while receiving this medicine. Do not treat yourself. This medicine may decrease your body's ability to fight infection.  You should make sure you get enough calcium and vitamin D while you are taking this medicine, unless your doctor tells you not to. Discuss the foods you eat and the vitamins you take with your health care professional.    See your dentist regularly. Brush and floss your teeth as directed. Before you have any dental work done, tell your dentist you are receiving this medicine.  Do not become pregnant while taking this medicine or for 5 months after stopping it. Women should inform their doctor if they wish to become pregnant or think they might be pregnant. There is a potential for serious side effects to an unborn child. Talk to your health care professional or pharmacist for more  information.  What side effects may I notice from receiving this medicine?  Side effects that you should report to your doctor or health care professional as soon as possible:  -allergic reactions like skin rash, itching or hives, swelling of the face, lips, or tongue  -breathing problems  -chest pain  -fast, irregular heartbeat  -feeling faint or lightheaded, falls  -fever, chills, or any other sign of infection  -muscle spasms, tightening, or twitches  -numbness or tingling  -skin blisters or bumps, or is dry, peels, or red  -slow healing or unexplained pain in the mouth or jaw  -unusual bleeding or bruising  Side effects that usually do not require medical attention (Report these to your doctor or health care professional if they continue or are bothersome.):  -muscle pain  -stomach upset, gas  This list may not describe all possible side effects. Call your doctor for medical advice about side effects. You may report side effects to FDA at 1-800-FDA-1088.  Where should I keep my medicine?  This medicine is only given in a clinic, doctor's office, or other health care setting and will not be stored at home.  NOTE: This sheet is a summary. It may not cover all possible information. If you have questions about this medicine, talk to your doctor, pharmacist, or health care provider.  © 2014, [employer]. (2011-12-05 12:37:47)

## 2013-11-04 NOTE — Patient Instructions (Signed)
Continue Tarceva 100 mg by mouth daily Followup in one month

## 2013-11-05 ENCOUNTER — Ambulatory Visit: Payer: Medicare Other | Admitting: Physical Therapy

## 2013-11-05 ENCOUNTER — Other Ambulatory Visit: Payer: Self-pay | Admitting: Internal Medicine

## 2013-11-05 DIAGNOSIS — IMO0001 Reserved for inherently not codable concepts without codable children: Secondary | ICD-10-CM | POA: Diagnosis not present

## 2013-11-07 ENCOUNTER — Ambulatory Visit: Payer: Medicare Other | Admitting: Physical Therapy

## 2013-11-07 DIAGNOSIS — IMO0001 Reserved for inherently not codable concepts without codable children: Secondary | ICD-10-CM | POA: Diagnosis not present

## 2013-11-12 ENCOUNTER — Ambulatory Visit: Payer: Medicare Other | Admitting: Physical Therapy

## 2013-11-12 DIAGNOSIS — IMO0001 Reserved for inherently not codable concepts without codable children: Secondary | ICD-10-CM | POA: Diagnosis not present

## 2013-11-14 ENCOUNTER — Ambulatory Visit: Payer: Medicare Other | Admitting: Physical Therapy

## 2013-11-14 DIAGNOSIS — IMO0001 Reserved for inherently not codable concepts without codable children: Secondary | ICD-10-CM | POA: Diagnosis not present

## 2013-11-19 ENCOUNTER — Encounter: Payer: Medicare Other | Admitting: Physical Therapy

## 2013-11-21 ENCOUNTER — Encounter: Payer: Medicare Other | Admitting: Physical Therapy

## 2013-11-25 ENCOUNTER — Encounter (HOSPITAL_COMMUNITY): Payer: Self-pay | Admitting: Emergency Medicine

## 2013-11-25 ENCOUNTER — Emergency Department (HOSPITAL_COMMUNITY)
Admission: EM | Admit: 2013-11-25 | Discharge: 2013-11-25 | Disposition: A | Discharge status: Home / Self Care | Payer: Medicare Other | Attending: Emergency Medicine | Admitting: Emergency Medicine

## 2013-11-25 ENCOUNTER — Emergency Department (HOSPITAL_COMMUNITY): Payer: Medicare Other

## 2013-11-25 ENCOUNTER — Encounter (HOSPITAL_COMMUNITY): Payer: Self-pay

## 2013-11-25 ENCOUNTER — Emergency Department (HOSPITAL_COMMUNITY)
Admission: RE | Admit: 2013-11-25 | Discharge: 2013-11-25 | Disposition: A | Discharge status: Home / Self Care | Payer: Medicare Other | Source: Ambulatory Visit | Attending: Physician Assistant | Admitting: Physician Assistant

## 2013-11-25 DIAGNOSIS — R339 Retention of urine, unspecified: Secondary | ICD-10-CM | POA: Insufficient documentation

## 2013-11-25 DIAGNOSIS — C7951 Secondary malignant neoplasm of bone: Secondary | ICD-10-CM | POA: Insufficient documentation

## 2013-11-25 DIAGNOSIS — C3490 Malignant neoplasm of unspecified part of unspecified bronchus or lung: Secondary | ICD-10-CM

## 2013-11-25 DIAGNOSIS — I10 Essential (primary) hypertension: Secondary | ICD-10-CM | POA: Insufficient documentation

## 2013-11-25 DIAGNOSIS — Z872 Personal history of diseases of the skin and subcutaneous tissue: Secondary | ICD-10-CM | POA: Insufficient documentation

## 2013-11-25 DIAGNOSIS — K5900 Constipation, unspecified: Secondary | ICD-10-CM | POA: Insufficient documentation

## 2013-11-25 DIAGNOSIS — Z79899 Other long term (current) drug therapy: Secondary | ICD-10-CM | POA: Insufficient documentation

## 2013-11-25 DIAGNOSIS — D61818 Other pancytopenia: Secondary | ICD-10-CM | POA: Insufficient documentation

## 2013-11-25 DIAGNOSIS — C7952 Secondary malignant neoplasm of bone marrow: Secondary | ICD-10-CM

## 2013-11-25 LAB — HEPATIC FUNCTION PANEL
ALBUMIN: 3.4 g/dL — AB (ref 3.5–5.2)
ALK PHOS: 62 U/L (ref 39–117)
ALT: 10 U/L (ref 0–35)
AST: 19 U/L (ref 0–37)
Bilirubin, Direct: <0.2 mg/dL (ref 0.0–0.3)
Total Bilirubin: 0.6 mg/dL (ref 0.3–1.2)
Total Protein: 5.9 g/dL — ABNORMAL LOW (ref 6.0–8.3)

## 2013-11-25 LAB — CBC WITH DIFFERENTIAL/PLATELET
BASOS ABS: 0 10*3/uL (ref 0.0–0.1)
Basophils Relative: 0 % (ref 0–1)
Eosinophils Absolute: 0.1 10*3/uL (ref 0.0–0.7)
Eosinophils Relative: 2 % (ref 0–5)
HEMATOCRIT: 25.9 % — AB (ref 36.0–46.0)
Hemoglobin: 8.8 g/dL — ABNORMAL LOW (ref 12.0–15.0)
Lymphocytes Relative: 16 % (ref 12–46)
Lymphs Abs: 0.8 10*3/uL (ref 0.7–4.0)
MCH: 33.6 pg (ref 26.0–34.0)
MCHC: 34 g/dL (ref 30.0–36.0)
MCV: 98.9 fL (ref 78.0–100.0)
Monocytes Absolute: 0.5 10*3/uL (ref 0.1–1.0)
Monocytes Relative: 9 % (ref 3–12)
NEUTROS ABS: 3.7 10*3/uL (ref 1.7–7.7)
Neutrophils Relative %: 73 % (ref 43–77)
Platelets: 191 10*3/uL (ref 150–400)
RBC: 2.62 MIL/uL — AB (ref 3.87–5.11)
RDW: 13.6 % (ref 11.5–15.5)
WBC: 5.1 10*3/uL (ref 4.0–10.5)

## 2013-11-25 LAB — URINALYSIS, ROUTINE W REFLEX MICROSCOPIC
Bilirubin Urine: NEGATIVE
GLUCOSE, UA: NEGATIVE mg/dL
Hgb urine dipstick: NEGATIVE
Ketones, ur: NEGATIVE mg/dL
LEUKOCYTES UA: NEGATIVE
Nitrite: NEGATIVE
PROTEIN: NEGATIVE mg/dL
SPECIFIC GRAVITY, URINE: 1.013 (ref 1.005–1.030)
Urobilinogen, UA: 0.2 mg/dL (ref 0.0–1.0)
pH: 5 (ref 5.0–8.0)

## 2013-11-25 LAB — BASIC METABOLIC PANEL
BUN: 33 mg/dL — ABNORMAL HIGH (ref 6–23)
CO2: 21 mEq/L (ref 19–32)
Calcium: 7.9 mg/dL — ABNORMAL LOW (ref 8.4–10.5)
Chloride: 104 mEq/L (ref 96–112)
Creatinine, Ser: 1.6 mg/dL — ABNORMAL HIGH (ref 0.50–1.10)
GFR calc Af Amer: 33 mL/min — ABNORMAL LOW (ref >90)
GFR calc non Af Amer: 28 mL/min — ABNORMAL LOW (ref >90)
Glucose, Bld: 92 mg/dL (ref 70–99)
Potassium: 4.1 mEq/L (ref 3.7–5.3)
SODIUM: 138 meq/L (ref 137–147)

## 2013-11-25 LAB — PROTIME-INR
INR: 1.05 (ref 0.00–1.49)
Prothrombin Time: 13.5 seconds (ref 11.6–15.2)

## 2013-11-25 MED ORDER — IOHEXOL 300 MG/ML  SOLN
50.0000 mL | Freq: Once | INTRAMUSCULAR | Status: AC | PRN
  Administered 2013-11-25: 50 mL via ORAL

## 2013-11-25 MED ORDER — HYDROMORPHONE HCL PF 1 MG/ML IJ SOLN
0.5000 mg | Freq: Once | INTRAMUSCULAR | Status: AC
  Administered 2013-11-25: 0.5 mg via INTRAVENOUS
  Filled 2013-11-25: qty 1

## 2013-11-25 MED ORDER — LORAZEPAM 2 MG/ML IJ SOLN
1.0000 mg | Freq: Four times a day (QID) | INTRAMUSCULAR | Status: DC
  Administered 2013-11-25: 1 mg via INTRAVENOUS
  Filled 2013-11-25: qty 1

## 2013-11-25 MED ORDER — GADOBENATE DIMEGLUMINE 529 MG/ML IV SOLN
15.0000 mL | Freq: Once | INTRAVENOUS | Status: AC | PRN
  Administered 2013-11-25: 7 mL via INTRAVENOUS

## 2013-11-25 NOTE — ED Notes (Signed)
Pt alert, arrives from home, c/o unable to urinate, onset was a month ago, + constipation, hx lung/spine CA, resp even unlabored

## 2013-11-25 NOTE — ED Provider Notes (Signed)
CSN: [id number]     Arrival date & time 11/25/13  0940 History   First MD Initiated Contact with Patient 11/25/13 [phone]     Chief Complaint  Patient presents with  . Urinary Retention     (Consider location/radiation/quality/duration/timing/severity/associated sxs/prior Treatment) HPI  Jamie Munoz is a 80 y.o. female with past medical history significant for lung cancer on palliative care, metastases to the spine complaining of acute onset of urinary retention starting at 9 PM last night. Reports abdominal pressure, no pain, denies any change in bowel habits, denies back pain above her norm, saddle anesthesia, chest pain, shortness of breath, fever, chills, nausea or vomiting. Patient is scheduled for CT abdomen pelvis and chest this afternoon. She cannot stay in the emergency room because her dogs have to be fed. Chronic constipation is unchanged. Palliative radiotherapy to the L3 lesion by [facility].   Past Medical History  Diagnosis Date  . Cancer   . Lung cancer   . Hypertension   . Cellulitis of left lower extremity 07/16/2012   Past Surgical History  Procedure Laterality Date  . Cholecystectomy    . Abdominal hysterectomy    . Breast surgery    . Vaginal sling     Family History  Problem Relation Age of Onset  . Cancer Mother   . Heart attack Father   . Cancer Brother   . Cancer Brother   . Cancer Brother    History  Substance Use Topics  . Smoking status: Never Smoker   . Smokeless tobacco: Never Used  . Alcohol Use: No   OB History   Grav Para Term Preterm Abortions TAB SAB Ect Mult Living                 Review of Systems  10 systems reviewed and found to be negative, except as noted in the HPI.    Allergies  Codeine  Home Medications   Prior to Admission medications   Medication Sig Start Date End Date Taking? Authorizing Provider  bisacodyl (DULCOLAX) 5 MG EC tablet Take 5 mg by mouth daily as needed for constipation.    Yes Historical Provider, MD   docusate sodium (COLACE) 100 MG capsule Take 100 mg by mouth 2 (two) times daily as needed for constipation.    Yes Historical Provider, MD  gabapentin (NEURONTIN) 100 MG capsule Take 100 mg by mouth 3 (three) times daily.   Yes Historical Provider, MD  olmesartan (BENICAR) 20 MG tablet Take 10 mg by mouth daily.    Yes Historical Provider, MD  OxyCODONE (OXYCONTIN) 20 mg T12A 12 hr tablet Take 20 mg by mouth at bedtime.   Yes Historical Provider, MD  polyethylene glycol (MIRALAX / GLYCOLAX) packet Take 17 g by mouth 2 (two) times daily. 08/18/12  Yes Eugenie Filler, MD  senna-docusate (SENOKOT-S) 8.6-50 MG per tablet Take 1 tablet by mouth at bedtime. 08/18/12  Yes Eugenie Filler, MD   BP 114/51  Pulse 66  Temp(Src) 98 F (36.7 C) (Oral)  Resp 16  Wt 130 lb (58.968 kg)  SpO2 98% Physical Exam  Nursing note and vitals reviewed. Constitutional: She is oriented to person, place, and time. She appears well-developed and well-nourished. No distress.  HENT:  Head: Normocephalic and atraumatic.  Mouth/Throat: Oropharynx is clear and moist.  Eyes: Conjunctivae and EOM are normal. Pupils are equal, round, and reactive to light.  Cardiovascular: Normal rate, regular rhythm and intact distal pulses.   Pulmonary/Chest: Effort normal  and breath sounds normal. No stridor. No respiratory distress. She has no wheezes. She has no rales. She exhibits no tenderness.  Abdominal: Soft. Bowel sounds are normal. She exhibits no distension and no mass. There is no tenderness. There is no rebound and no guarding.  Genitourinary:  External hemorrhoids, normal rectal tone, no saddle anesthesia. Normal stool color  Musculoskeletal: Normal range of motion.  Neurological: She is alert and oriented to person, place, and time.  Skin: Skin is warm.  Psychiatric:  Anxious    ED Course  Procedures (including critical care time) Labs Review Labs Reviewed  CBC WITH DIFFERENTIAL - Abnormal; Notable for the  following:    RBC 2.62 (*)    Hemoglobin 8.8 (*)    HCT 25.9 (*)    All other components within normal limits  BASIC METABOLIC PANEL - Abnormal; Notable for the following:    BUN 33 (*)    Creatinine, Ser 1.60 (*)    Calcium 7.9 (*)    GFR calc non Af Amer 28 (*)    GFR calc Af Amer 33 (*)    All other components within normal limits  HEPATIC FUNCTION PANEL - Abnormal; Notable for the following:    Total Protein 5.9 (*)    Albumin 3.4 (*)    All other components within normal limits  URINE CULTURE  URINALYSIS, ROUTINE W REFLEX MICROSCOPIC  PROTIME-INR    Imaging Review Ct Abdomen Pelvis Wo Contrast  11/25/2013   CLINICAL DATA:  Non-small-cell lung cancer, restaging.  EXAM: CT CHEST, ABDOMEN, AND PELVIS WITH CONTRAST  TECHNIQUE: Multidetector CT imaging of the chest, abdomen and pelvis was performed following the standard protocol during bolus administration of intravenous contrast.  CONTRAST:  106mL OMNIPAQUE IOHEXOL 300 MG/ML  SOLN  COMPARISON:  MR lumbar spine 11/25/2013 in CT chest abdomen pelvis 07/23/2013.  FINDINGS: CT CHEST FINDINGS  Right IJ Port-A-Cath terminates in the low SVC. No pathologically enlarged mediastinal lymph nodes. Hilar regions are difficult to definitively evaluate without IV contrast. No axillary adenopathy. Atherosclerotic calcification of the arterial vasculature, including three-vessel involvement of the coronary arteries. Heart size normal. No pericardial effusion.  Right upper lobe mass measures 4.4 x 4.9 cm (previously 3.2 x 4.0 cm). Lungs are otherwise clear. No pleural fluid. Airway is otherwise unremarkable.  CT ABDOMEN AND PELVIS FINDINGS  Intrahepatic and extrahepatic biliary duct dilatation is stable. Liver is otherwise unremarkable. Cholecystectomy. Right adrenal gland is unremarkable. Left adrenal nodule measures 1.3 x 1.8 cm and is new. Kidneys, spleen, pancreas, stomach and bowel are unremarkable. Foley catheter is seen in a decompressed bladder.  Hysterectomy. Atherosclerotic calcification of the arterial vasculature without abdominal aortic aneurysm. Retroaortic left renal vein. No pathologically enlarged lymph nodes. No free fluid. Small bilateral inguinal hernias contain fat.  Sclerosis along the left lateral margin of the inferior sternum (series 2, image 40) is unchanged. Thoracolumbar spinal metastases are better seen and described on thoracolumbar spine MR done the same day. Degenerative changes are seen in the spine. L3 vertebroplasty. Right humeral neck fracture, as on 09/18/2013.  IMPRESSION: 1. Enlarging right upper lobe mass and new left adrenal nodule, indicative of metastatic lung cancer progression. 2. Focal sclerosis in the sternum is unchanged. Thoracolumbar spinal metastases are better seen and described on MR performed earlier the same day. 3. Three-vessel coronary artery calcification.   Electronically Signed   By: Lorin Picket M.D.   On: 11/25/2013 15:20   Ct Chest Wo Contrast  11/25/2013   CLINICAL DATA:  Non-small-cell lung cancer, restaging.  EXAM: CT CHEST, ABDOMEN, AND PELVIS WITH CONTRAST  TECHNIQUE: Multidetector CT imaging of the chest, abdomen and pelvis was performed following the standard protocol during bolus administration of intravenous contrast.  CONTRAST:  44mL OMNIPAQUE IOHEXOL 300 MG/ML  SOLN  COMPARISON:  MR lumbar spine 11/25/2013 in CT chest abdomen pelvis 07/23/2013.  FINDINGS: CT CHEST FINDINGS  Right IJ Port-A-Cath terminates in the low SVC. No pathologically enlarged mediastinal lymph nodes. Hilar regions are difficult to definitively evaluate without IV contrast. No axillary adenopathy. Atherosclerotic calcification of the arterial vasculature, including three-vessel involvement of the coronary arteries. Heart size normal. No pericardial effusion.  Right upper lobe mass measures 4.4 x 4.9 cm (previously 3.2 x 4.0 cm). Lungs are otherwise clear. No pleural fluid. Airway is otherwise unremarkable.  CT ABDOMEN  AND PELVIS FINDINGS  Intrahepatic and extrahepatic biliary duct dilatation is stable. Liver is otherwise unremarkable. Cholecystectomy. Right adrenal gland is unremarkable. Left adrenal nodule measures 1.3 x 1.8 cm and is new. Kidneys, spleen, pancreas, stomach and bowel are unremarkable. Foley catheter is seen in a decompressed bladder. Hysterectomy. Atherosclerotic calcification of the arterial vasculature without abdominal aortic aneurysm. Retroaortic left renal vein. No pathologically enlarged lymph nodes. No free fluid. Small bilateral inguinal hernias contain fat.  Sclerosis along the left lateral margin of the inferior sternum (series 2, image 40) is unchanged. Thoracolumbar spinal metastases are better seen and described on thoracolumbar spine MR done the same day. Degenerative changes are seen in the spine. L3 vertebroplasty. Right humeral neck fracture, as on 09/18/2013.  IMPRESSION: 1. Enlarging right upper lobe mass and new left adrenal nodule, indicative of metastatic lung cancer progression. 2. Focal sclerosis in the sternum is unchanged. Thoracolumbar spinal metastases are better seen and described on MR performed earlier the same day. 3. Three-vessel coronary artery calcification.   Electronically Signed   By: Lorin Picket M.D.   On: 11/25/2013 15:20   Mr Thoracic Spine W Wo Contrast  11/25/2013   CLINICAL DATA:  Non-small cell lung cancer. Urinary retention. Question cord compression.  EXAM: MRI THORACIC AND LUMBAR SPINE WITHOUT AND WITH CONTRAST  TECHNIQUE: Multiplanar and multiecho pulse sequences of the thoracic and lumbar spine were obtained without and with intravenous contrast.  CONTRAST:  71mL MULTIHANCE GADOBENATE DIMEGLUMINE 529 MG/ML IV SOLN  COMPARISON:  CT chest, abdomen, and pelvis 07/23/2013.  FINDINGS: MR THORACIC SPINE FINDINGS  Abnormal signal is present within the C5 vertebral body on the scout images, suggesting tumor infiltration. There is no collapse or retropulsion of bone.  No spinal stenosis is evident.  Normal signal is present throughout the thoracic spinal cord. Marrow signal is slightly heterogeneous throughout the thoracic spine. An enhancing nodule at L2 to measures 14 x 18 mm on the sagittal images. The lesion at T3 measures 8 mm maximally. There is no collapse at either level. Marrow signal is somewhat heterogeneous without other focal lesions. There is no dural or epidural enhancement to suggest metastatic disease within the spinal canal. An enhancing posterior medial right upper lobe mass at the T5 and T6 level measures 4.8 x 4.1 cm. There is no direct encroachment into the foramina. There is invasion into the adjacent rib with possible neuro involvement along the undersurface of the T5 or T6 ribs.  Vertebral body heights and alignment are maintained.  No significant disc protrusions are present. Asymmetric left-sided facet hypertrophy at T11-12 results in minimal left foraminal narrowing.  MR LUMBAR SPINE FINDINGS  Normal signal is present  in the conus medullaris which terminates at L1-2, within normal limits. The patient is status post spinal augmentation at L3. Leftward curvature of the lumbar spine is centered at L4. Chronic sclerotic changes are present anteriorly at L2 and L3. There is enhancement posteriorly within the L3 vertebral body compatible with metastasis. An metastasis scratch of the metastasis at L1 is stable. A 4 mm focus of enhancement is seen just to the right of midline at S1. No other foci of metastatic disease are evident.  There is straightening and some reversal of the normal lumbar lordosis. Slight retrolisthesis at L3-4 is stable.  Limited imaging of the abdomen is unremarkable.  L1-2:  Negative.  L2-3: Slight anterolisthesis is present. There is uncovering of a leftward disc protrusion. Mild left lateral recess narrowing is present.  L3-4: A leftward disc protrusion is present. Mild left lateral recess and foraminal narrowing is present.  L4-5: A  rightward disc protrusion is present. Mild right lateral recess narrowing is evident. The right foramen is patent.  L5-S1: A leftward disc protrusion is present. Mild facet hypertrophy is worse on the left. This results in mild left lateral recess and foraminal stenosis.  IMPRESSION: 1. No evidence for metastatic disease within the spinal canal or significant cord compromise. 2. Spinal augmentation at L3-4 for a pathologic fracture with some retropulsion of bone. 3. Leftward disc protrusions at L2-3 and L3-4 with mild lateral recess narrowing at both levels. 4. Enhancing metastases at T3, L2, and S1 as described. There is no collapse of these levels. 5. 4.8 cm medial posterior right upper lobe enhancing mass without direct invasion into the spinal canal or neural foramina. There does appear to be invasion into the ribs, likely affecting the T5 and T6 nerves along the undersurface of the ribs.   Electronically Signed   By: Lawrence Santiago M.D.   On: 11/25/2013 14:09   Mr Lumbar Spine W Wo Contrast  11/25/2013   CLINICAL DATA:  Non-small cell lung cancer. Urinary retention. Question cord compression.  EXAM: MRI THORACIC AND LUMBAR SPINE WITHOUT AND WITH CONTRAST  TECHNIQUE: Multiplanar and multiecho pulse sequences of the thoracic and lumbar spine were obtained without and with intravenous contrast.  CONTRAST:  43mL MULTIHANCE GADOBENATE DIMEGLUMINE 529 MG/ML IV SOLN  COMPARISON:  CT chest, abdomen, and pelvis 07/23/2013.  FINDINGS: MR THORACIC SPINE FINDINGS  Abnormal signal is present within the C5 vertebral body on the scout images, suggesting tumor infiltration. There is no collapse or retropulsion of bone. No spinal stenosis is evident.  Normal signal is present throughout the thoracic spinal cord. Marrow signal is slightly heterogeneous throughout the thoracic spine. An enhancing nodule at L2 to measures 14 x 18 mm on the sagittal images. The lesion at T3 measures 8 mm maximally. There is no collapse at either  level. Marrow signal is somewhat heterogeneous without other focal lesions. There is no dural or epidural enhancement to suggest metastatic disease within the spinal canal. An enhancing posterior medial right upper lobe mass at the T5 and T6 level measures 4.8 x 4.1 cm. There is no direct encroachment into the foramina. There is invasion into the adjacent rib with possible neuro involvement along the undersurface of the T5 or T6 ribs.  Vertebral body heights and alignment are maintained.  No significant disc protrusions are present. Asymmetric left-sided facet hypertrophy at T11-12 results in minimal left foraminal narrowing.  MR LUMBAR SPINE FINDINGS  Normal signal is present in the conus medullaris which terminates at L1-2, within  normal limits. The patient is status post spinal augmentation at L3. Leftward curvature of the lumbar spine is centered at L4. Chronic sclerotic changes are present anteriorly at L2 and L3. There is enhancement posteriorly within the L3 vertebral body compatible with metastasis. An metastasis scratch of the metastasis at L1 is stable. A 4 mm focus of enhancement is seen just to the right of midline at S1. No other foci of metastatic disease are evident.  There is straightening and some reversal of the normal lumbar lordosis. Slight retrolisthesis at L3-4 is stable.  Limited imaging of the abdomen is unremarkable.  L1-2:  Negative.  L2-3: Slight anterolisthesis is present. There is uncovering of a leftward disc protrusion. Mild left lateral recess narrowing is present.  L3-4: A leftward disc protrusion is present. Mild left lateral recess and foraminal narrowing is present.  L4-5: A rightward disc protrusion is present. Mild right lateral recess narrowing is evident. The right foramen is patent.  L5-S1: A leftward disc protrusion is present. Mild facet hypertrophy is worse on the left. This results in mild left lateral recess and foraminal stenosis.  IMPRESSION: 1. No evidence for  metastatic disease within the spinal canal or significant cord compromise. 2. Spinal augmentation at L3-4 for a pathologic fracture with some retropulsion of bone. 3. Leftward disc protrusions at L2-3 and L3-4 with mild lateral recess narrowing at both levels. 4. Enhancing metastases at T3, L2, and S1 as described. There is no collapse of these levels. 5. 4.8 cm medial posterior right upper lobe enhancing mass without direct invasion into the spinal canal or neural foramina. There does appear to be invasion into the ribs, likely affecting the T5 and T6 nerves along the undersurface of the ribs.   Electronically Signed   By: Lawrence Santiago M.D.   On: 11/25/2013 14:09     EKG Interpretation None      MDM   Final diagnoses:  Urinary retention  Non-small cell carcinoma of lung  Pancytopenia    Filed Vitals:   11/25/13 1216 11/25/13 1410 11/25/13 1515 11/25/13 1602  BP: 141/57 114/51  115/48  Pulse: 71 75 66 98  Temp:    98.4 F (36.9 C)  TempSrc:    Oral  Resp: 16 16  16   Weight:      SpO2: 100% 99% 98% 92%    Medications  LORazepam (ATIVAN) injection 1 mg (1 mg Intravenous Given 11/25/13 1215)  HYDROmorphone (DILAUDID) injection 0.5 mg (0.5 mg Intravenous Given 11/25/13 1318)  gadobenate dimeglumine (MULTIHANCE) injection 15 mL (7 mLs Intravenous Contrast Given 11/25/13 1333)    Jamie Munoz is a 80 y.o. female with metastatic lung cancer, undergoing chemotherapy presenting with urinary retention onset last night. 800 cc in the bladder. Patient has known spinal metastases to T3. Rectal exam shows good tone, no saddle anesthesia. Patient is scheduled for staging CT abdomen and chest today. Patient will not make it to the outpatient imaging appointment, will obtain CTs in the ED. Attending physician has spoken with her oncologist, Dr. Inda Merlin, who recommends further evaluation for the urinary retention; patient will have MRI with contrast to thoracic and lumbar spine. UA with no signs of  infection. Creatinine at her baseline. GFR of 28. Discussed with CT tech, contrast is contraindicated for CT but MRI will be performed with half dose contrast. Patient is anemic at 8.8 over 25.9 but this is not atypical for her general baseline. MRI shows no acute abnormalities. CT chest and abdomen pelvis with  no acute abnormalities.  Patient will be discharged with Foley catheter and asked to follow closely with urology Dr. Jasmine December.  This is a shared visit with the attending physician who personally evaluated the patient and agrees with the care plan.   Evaluation does not show pathology that would require ongoing emergent intervention or inpatient treatment. Pt is hemodynamically stable and mentating appropriately. Discussed findings and plan with patient/guardian, who agrees with care plan. All questions answered. Return precautions discussed and outpatient follow up given.      Monico Blitz, PA-C 11/25/13 [phone]

## 2013-11-25 NOTE — ED Provider Notes (Signed)
  Face-to-face evaluation   History: She presents for evaluation of increasing difficulty urinating. It is associated with constipation. She reports since being catheterized, here, today, she feels back to baseline.   Physical exam: Alert, elderly, female, in no apparent distress. Heart regular rate, rhythm, no murmur. Lungs clear to auscultation. Abdomen soft and nontender. Foley catheter is draining clear urine.  11:20-discuss case with oncology. He would like to proceed with her currently scheduled CT imaging, for staging. We will evaluate for spinal myelopathy, with MR.  Medical screening examination/treatment/procedure(s) were conducted as a shared visit with non-physician practitioner(s) and myself.  I personally evaluated the patient during the encounter  Richarda Blade, MD 11/26/13 [phone]

## 2013-11-25 NOTE — ED Notes (Signed)
Bladder scan, 851 mL urine noted

## 2013-11-25 NOTE — ED Notes (Signed)
Pt transported to MRI by Marjory Lies and [facility].

## 2013-11-25 NOTE — ED Notes (Signed)
Pt returned from XRAY 

## 2013-11-25 NOTE — ED Notes (Signed)
PA Elmyra Ricks at bedside.

## 2013-11-25 NOTE — ED Notes (Signed)
Awaiting for MRI to call then will admin Ativan.

## 2013-11-25 NOTE — Discharge Instructions (Signed)
Please follow with your primary care doctor in the next 2 days for a check-up. They must obtain records for further management.  ° °Do not hesitate to return to the Emergency Department for any new, worsening or concerning symptoms.  ° °

## 2013-11-25 NOTE — ED Notes (Signed)
MD Wentz at bedside.  

## 2013-11-25 NOTE — ED Notes (Signed)
Pt medicated for pain while in MRI by this RN.

## 2013-11-26 LAB — URINE CULTURE
Colony Count: NO GROWTH
Culture: NO GROWTH

## 2013-11-27 ENCOUNTER — Other Ambulatory Visit (HOSPITAL_BASED_OUTPATIENT_CLINIC_OR_DEPARTMENT_OTHER): Payer: Medicare Other

## 2013-11-27 ENCOUNTER — Encounter: Payer: Self-pay | Admitting: Internal Medicine

## 2013-11-27 ENCOUNTER — Encounter (INDEPENDENT_AMBULATORY_CARE_PROVIDER_SITE_OTHER): Payer: Self-pay

## 2013-11-27 ENCOUNTER — Telehealth: Payer: Self-pay | Admitting: Internal Medicine

## 2013-11-27 ENCOUNTER — Ambulatory Visit (HOSPITAL_BASED_OUTPATIENT_CLINIC_OR_DEPARTMENT_OTHER): Payer: Medicare Other | Admitting: Internal Medicine

## 2013-11-27 VITALS — BP 134/54 | HR 71 | Temp 97.9°F | Resp 17 | Ht 66.0 in | Wt 125.7 lb

## 2013-11-27 DIAGNOSIS — C7952 Secondary malignant neoplasm of bone marrow: Secondary | ICD-10-CM

## 2013-11-27 DIAGNOSIS — C3490 Malignant neoplasm of unspecified part of unspecified bronchus or lung: Secondary | ICD-10-CM

## 2013-11-27 DIAGNOSIS — C3410 Malignant neoplasm of upper lobe, unspecified bronchus or lung: Secondary | ICD-10-CM

## 2013-11-27 DIAGNOSIS — D649 Anemia, unspecified: Secondary | ICD-10-CM

## 2013-11-27 DIAGNOSIS — C7951 Secondary malignant neoplasm of bone: Secondary | ICD-10-CM

## 2013-11-27 LAB — COMPREHENSIVE METABOLIC PANEL (CC13)
ALBUMIN: 3.3 g/dL — AB (ref 3.5–5.0)
ALK PHOS: 65 U/L (ref 40–150)
ALT: 10 U/L (ref 0–55)
ANION GAP: 7 meq/L (ref 3–11)
AST: 18 U/L (ref 5–34)
BUN: 25.8 mg/dL (ref 7.0–26.0)
CO2: 25 mEq/L (ref 22–29)
Calcium: 7.9 mg/dL — ABNORMAL LOW (ref 8.4–10.4)
Chloride: 109 mEq/L (ref 98–109)
Creatinine: 1.4 mg/dL — ABNORMAL HIGH (ref 0.6–1.1)
GLUCOSE: 105 mg/dL (ref 70–140)
POTASSIUM: 4.2 meq/L (ref 3.5–5.1)
Sodium: 141 mEq/L (ref 136–145)
Total Bilirubin: 0.56 mg/dL (ref 0.20–1.20)
Total Protein: 6.1 g/dL — ABNORMAL LOW (ref 6.4–8.3)

## 2013-11-27 LAB — CBC WITH DIFFERENTIAL/PLATELET
BASO%: 0.5 % (ref 0.0–2.0)
BASOS ABS: 0 10*3/uL (ref 0.0–0.1)
EOS ABS: 0.1 10*3/uL (ref 0.0–0.5)
EOS%: 2 % (ref 0.0–7.0)
HCT: 26.9 % — ABNORMAL LOW (ref 34.8–46.6)
HEMOGLOBIN: 8.8 g/dL — AB (ref 11.6–15.9)
LYMPH%: 15.3 % (ref 14.0–49.7)
MCH: 32.9 pg (ref 25.1–34.0)
MCHC: 32.7 g/dL (ref 31.5–36.0)
MCV: 100.5 fL (ref 79.5–101.0)
MONO#: 0.4 10*3/uL (ref 0.1–0.9)
MONO%: 9.3 % (ref 0.0–14.0)
NEUT#: 3.4 10*3/uL (ref 1.5–6.5)
NEUT%: 72.9 % (ref 38.4–76.8)
Platelets: 188 10*3/uL (ref 145–400)
RBC: 2.68 10*6/uL — ABNORMAL LOW (ref 3.70–5.45)
RDW: 13.6 % (ref 11.2–14.5)
WBC: 4.7 10*3/uL (ref 3.9–10.3)
lymph#: 0.7 10*3/uL — ABNORMAL LOW (ref 0.9–3.3)

## 2013-11-27 MED ORDER — OXYCODONE HCL ER 20 MG PO T12A: 20.0000 mg | EXTENDED_RELEASE_TABLET | Freq: Every day | ORAL | Status: DC

## 2013-11-27 NOTE — Progress Notes (Signed)
Jamie Munoz [phone]   [phone]  OFFICE PROGRESS NOTE  [name], MD [address] [place] [address]  DIAGNOSIS: Metastatic non-small cell lung cancer (T2 NX M1). Presented with right upper lobe lung mass as well as multiple pulmonary nodules and bone metastasis at L3 vertebral body. This was diagnosed in March of 2006.   PRIOR THERAPY:  1. Status post palliative radiotherapy to the L3 lesion under the care of Dr. Tammi Klippel completed September 17, 2004. 2. Status post 6 cycles of systemic chemotherapy with carboplatin and paclitaxel. Last dose was given January 01, 2005. 3. Status post palliative radiotherapy to the right upper lobe lung mass under the care of Dr. Tammi Klippel completed on 12/29/2010. 4. Status post treatment with Tarceva 150 mg p.o. daily started April 2007 through July 2012. 5. Tarceva 100 mg p.o. daily started July 2012. 6. Systemic chemotherapy with Carboplatin for AUC 5 and Alimta 500 mg/m2 given every 3 weeks. Status post 6 cycles. 7. Maintenance chemotherapy with single agent Alimta at 500 mg per meter squared given every 3 weeks, status post 1 cycle given on 07/30/2012.  CURRENT THERAPY:  1. Zometa 4 mg IV q. 3 months for bone metastasis. 2. Tarceva 100 mg by mouth daily resumed therapy on 01/07/13  CHEMOTHERAPY INTENT: Palliative  CURRENT # OF CHEMOTHERAPY CYCLES: 6 CURRENT ANTIEMETICS: Compazine  CURRENT SMOKING STATUS: Never Smoker  ORAL CHEMOTHERAPY AND CONSENT: yes  CURRENT BISPHOSPHONATES USE: Zometa every 3 months  PAIN MANAGEMENT: OxyContin  NARCOTICS INDUCED CONSTIPATION: yes but well managed with Senokot-S and as needed Dulcolax  LIVING WILL AND CODE STATUS: Full code  INTERVAL HISTORY: Jamie Munoz 80 y.o. female returns to the clinic today for followup visit. The patient is feeling fine today with no specific complaints except for back pain and fatigue. She is tolerating her current treatment with  Tarceva fairly well with no significant adverse effects. She has constipation and she tried Dulcolax with no improvement. She was also recently found to have urinary retention and she has a Foley's catheter in place. The patient denied having any skin rash or diarrhea. She continues to have mild fatigue. The patient denied having any chest pain, shortness of breath, cough or hemoptysis.  She had repeat CT scan of the chest, abdomen and pelvis performed recently and she is here for evaluation and discussion of her scan results. MEDICAL HISTORY: Past Medical History  Diagnosis Date  . Cancer   . Lung cancer   . Hypertension   . Cellulitis of left lower extremity 07/16/2012    ALLERGIES:  is allergic to codeine.  MEDICATIONS:  Current Outpatient Prescriptions  Medication Sig Dispense Refill  . bisacodyl (DULCOLAX) 5 MG EC tablet Take 5 mg by mouth daily as needed for constipation.       . docusate sodium (COLACE) 100 MG capsule Take 100 mg by mouth 2 (two) times daily as needed for constipation.       . gabapentin (NEURONTIN) 100 MG capsule Take 100 mg by mouth 3 (three) times daily.      Marland Kitchen olmesartan (BENICAR) 20 MG tablet Take 10 mg by mouth daily.       . OxyCODONE (OXYCONTIN) 20 mg T12A 12 hr tablet Take 1 tablet (20 mg total) by mouth at bedtime.  60 tablet  0  . polyethylene glycol (MIRALAX / GLYCOLAX) packet Take 17 g by mouth 2 (two) times daily.  14 each    .  senna-docusate (SENOKOT-S) 8.6-50 MG per tablet Take 1 tablet by mouth at bedtime.       No current facility-administered medications for this visit.   Facility-Administered Medications Ordered in Other Visits  Medication Dose Route Frequency Provider Last Rate Last Dose  . influenza  inactive virus vaccine (FLUZONE/FLUARIX) injection 0.5 mL  0.5 mL Intramuscular Once Curt Bears, MD      . sodium chloride 0.9 % injection 10 mL  10 mL Intravenous PRN Curt Bears, MD   10 mL at 11/01/13 0948    SURGICAL HISTORY:    Past Surgical History  Procedure Laterality Date  . Cholecystectomy    . Abdominal hysterectomy    . Breast surgery    . Vaginal sling      REVIEW OF SYSTEMS:  Constitutional: positive for fatigue Eyes: negative Ears, nose, mouth, throat, and face: negative Respiratory: negative Cardiovascular: negative Gastrointestinal: negative Genitourinary:positive for decreased stream and Urinary retention Integument/breast: negative Hematologic/lymphatic: negative Musculoskeletal:positive for back pain Neurological: negative Behavioral/Psych: negative Endocrine: negative Allergic/Immunologic: negative   PHYSICAL EXAMINATION: General appearance: alert, cooperative and no distress Head: Normocephalic, without obvious abnormality, atraumatic Neck: no adenopathy, no JVD, supple, symmetrical, trachea midline and thyroid not enlarged, symmetric, no tenderness/mass/nodules Lymph nodes: Cervical, supraclavicular, and axillary nodes normal. Resp: clear to auscultation bilaterally Back: symmetric, no curvature. ROM normal. No CVA tenderness. Cardio: regular rate and rhythm, S1, S2 normal, no murmur, click, rub or gallop GI: soft, non-tender; bowel sounds normal; no masses,  no organomegaly Extremities: extremities normal, atraumatic, no cyanosis or edema Neurologic: Alert and oriented X 3, normal strength and tone. Normal symmetric reflexes. Normal coordination and gait  ECOG PERFORMANCE STATUS: 1 - Symptomatic but completely ambulatory  Blood pressure 134/54, pulse 71, temperature 97.9 F (36.6 C), temperature source Oral, resp. rate 17, height $RemoveBe'5\' 6"'ljiCSsrKK$  (1.676 m), weight 125 lb 11.2 oz (57.017 kg), SpO2 96.00%.  LABORATORY DATA: Lab Results  Component Value Date   WBC 4.7 11/27/2013   HGB 8.8* 11/27/2013   HCT 26.9* 11/27/2013   MCV 100.5 11/27/2013   PLT 188 11/27/2013      Chemistry      Component Value Date/Time   NA 141 11/27/2013 1026   NA 138 11/25/2013 1040   NA 139 09/02/2011 1406    K 4.2 11/27/2013 1026   K 4.1 11/25/2013 1040   K 3.9 09/02/2011 1406   CL 104 11/25/2013 1040   CL 105 10/02/2012 1056   CL 97* 09/02/2011 1406   CO2 25 11/27/2013 1026   CO2 21 11/25/2013 1040   CO2 29 09/02/2011 1406   BUN 25.8 11/27/2013 1026   BUN 33* 11/25/2013 1040   BUN 18 09/02/2011 1406   CREATININE 1.4* 11/27/2013 1026   CREATININE 1.60* 11/25/2013 1040   CREATININE 1.2 09/02/2011 1406      Component Value Date/Time   CALCIUM 7.9* 11/27/2013 1026   CALCIUM 7.9* 11/25/2013 1040   CALCIUM 8.0 09/02/2011 1406   ALKPHOS 65 11/27/2013 1026   ALKPHOS 62 11/25/2013 1040   ALKPHOS 71 09/02/2011 1406   AST 18 11/27/2013 1026   AST 19 11/25/2013 1040   AST 25 09/02/2011 1406   ALT 10 11/27/2013 1026   ALT 10 11/25/2013 1040   ALT 20 09/02/2011 1406   BILITOT 0.56 11/27/2013 1026   BILITOT 0.6 11/25/2013 1040   BILITOT 0.60 09/02/2011 1406       RADIOGRAPHIC STUDIES: Ct Chest Wo Contrast  11/25/2013   CLINICAL DATA:  Non-small-cell lung  cancer, restaging.  EXAM: CT CHEST, ABDOMEN, AND PELVIS WITH CONTRAST  TECHNIQUE: Multidetector CT imaging of the chest, abdomen and pelvis was performed following the standard protocol during bolus administration of intravenous contrast.  CONTRAST:  36mL OMNIPAQUE IOHEXOL 300 MG/ML  SOLN  COMPARISON:  MR lumbar spine 11/25/2013 in CT chest abdomen pelvis 07/23/2013.  FINDINGS:   CT CHEST FINDINGS  Right IJ Port-A-Cath terminates in the low SVC. No pathologically enlarged mediastinal lymph nodes. Hilar regions are difficult to definitively evaluate without IV contrast. No axillary adenopathy. Atherosclerotic calcification of the arterial vasculature, including three-vessel involvement of the coronary arteries. Heart size normal. No pericardial effusion.  Right upper lobe mass measures 4.4 x 4.9 cm (previously 3.2 x 4.0 cm). Lungs are otherwise clear. No pleural fluid. Airway is otherwise unremarkable.    CT ABDOMEN AND PELVIS FINDINGS  Intrahepatic and extrahepatic biliary duct  dilatation is stable. Liver is otherwise unremarkable. Cholecystectomy. Right adrenal gland is unremarkable. Left adrenal nodule measures 1.3 x 1.8 cm and is new. Kidneys, spleen, pancreas, stomach and bowel are unremarkable. Foley catheter is seen in a decompressed bladder. Hysterectomy. Atherosclerotic calcification of the arterial vasculature without abdominal aortic aneurysm. Retroaortic left renal vein. No pathologically enlarged lymph nodes. No free fluid. Small bilateral inguinal hernias contain fat.  Sclerosis along the left lateral margin of the inferior sternum (series 2, image 40) is unchanged. Thoracolumbar spinal metastases are better seen and described on thoracolumbar spine MR done the same day. Degenerative changes are seen in the spine. L3 vertebroplasty. Right humeral neck fracture, as on 09/18/2013.    IMPRESSION: 1. Enlarging right upper lobe mass and new left adrenal nodule, indicative of metastatic lung cancer progression. 2. Focal sclerosis in the sternum is unchanged. Thoracolumbar spinal metastases are better seen and described on MR performed earlier the same day. 3. Three-vessel coronary artery calcification.   Electronically Signed   By: Lorin Picket M.D.   On: 11/25/2013 15:20   Mr Thoracic Spine W Wo Contrast  11/25/2013   CLINICAL DATA:  Non-small cell lung cancer. Urinary retention. Question cord compression.  EXAM: MRI THORACIC AND LUMBAR SPINE WITHOUT AND WITH CONTRAST  TECHNIQUE: Multiplanar and multiecho pulse sequences of the thoracic and lumbar spine were obtained without and with intravenous contrast.  CONTRAST:  18mL MULTIHANCE GADOBENATE DIMEGLUMINE 529 MG/ML IV SOLN  COMPARISON:  CT chest, abdomen, and pelvis 07/23/2013.  FINDINGS: MR THORACIC SPINE FINDINGS  Abnormal signal is present within the C5 vertebral body on the scout images, suggesting tumor infiltration. There is no collapse or retropulsion of bone. No spinal stenosis is evident.  Normal signal is present  throughout the thoracic spinal cord. Marrow signal is slightly heterogeneous throughout the thoracic spine. An enhancing nodule at L2 to measures 14 x 18 mm on the sagittal images. The lesion at T3 measures 8 mm maximally. There is no collapse at either level. Marrow signal is somewhat heterogeneous without other focal lesions. There is no dural or epidural enhancement to suggest metastatic disease within the spinal canal. An enhancing posterior medial right upper lobe mass at the T5 and T6 level measures 4.8 x 4.1 cm. There is no direct encroachment into the foramina. There is invasion into the adjacent rib with possible neuro involvement along the undersurface of the T5 or T6 ribs.  Vertebral body heights and alignment are maintained.  No significant disc protrusions are present. Asymmetric left-sided facet hypertrophy at T11-12 results in minimal left foraminal narrowing.  MR LUMBAR SPINE FINDINGS  Normal signal is present in the conus medullaris which terminates at L1-2, within normal limits. The patient is status post spinal augmentation at L3. Leftward curvature of the lumbar spine is centered at L4. Chronic sclerotic changes are present anteriorly at L2 and L3. There is enhancement posteriorly within the L3 vertebral body compatible with metastasis. An metastasis scratch of the metastasis at L1 is stable. A 4 mm focus of enhancement is seen just to the right of midline at S1. No other foci of metastatic disease are evident.  There is straightening and some reversal of the normal lumbar lordosis. Slight retrolisthesis at L3-4 is stable.  Limited imaging of the abdomen is unremarkable.  L1-2:  Negative.  L2-3: Slight anterolisthesis is present. There is uncovering of a leftward disc protrusion. Mild left lateral recess narrowing is present.  L3-4: A leftward disc protrusion is present. Mild left lateral recess and foraminal narrowing is present.  L4-5: A rightward disc protrusion is present. Mild right lateral  recess narrowing is evident. The right foramen is patent.  L5-S1: A leftward disc protrusion is present. Mild facet hypertrophy is worse on the left. This results in mild left lateral recess and foraminal stenosis.  IMPRESSION: 1. No evidence for metastatic disease within the spinal canal or significant cord compromise. 2. Spinal augmentation at L3-4 for a pathologic fracture with some retropulsion of bone. 3. Leftward disc protrusions at L2-3 and L3-4 with mild lateral recess narrowing at both levels. 4. Enhancing metastases at T3, L2, and S1 as described. There is no collapse of these levels. 5. 4.8 cm medial posterior right upper lobe enhancing mass without direct invasion into the spinal canal or neural foramina. There does appear to be invasion into the ribs, likely affecting the T5 and T6 nerves along the undersurface of the ribs.   Electronically Signed   By: Gennette Pac M.D.   On: 11/25/2013 14:09   Mr Lumbar Spine W Wo Contrast  11/25/2013   CLINICAL DATA:  Non-small cell lung cancer. Urinary retention. Question cord compression.  EXAM: MRI THORACIC AND LUMBAR SPINE WITHOUT AND WITH CONTRAST  TECHNIQUE: Multiplanar and multiecho pulse sequences of the thoracic and lumbar spine were obtained without and with intravenous contrast.  CONTRAST:  63mL MULTIHANCE GADOBENATE DIMEGLUMINE 529 MG/ML IV SOLN  COMPARISON:  CT chest, abdomen, and pelvis 07/23/2013.  FINDINGS: MR THORACIC SPINE FINDINGS  Abnormal signal is present within the C5 vertebral body on the scout images, suggesting tumor infiltration. There is no collapse or retropulsion of bone. No spinal stenosis is evident.  Normal signal is present throughout the thoracic spinal cord. Marrow signal is slightly heterogeneous throughout the thoracic spine. An enhancing nodule at L2 to measures 14 x 18 mm on the sagittal images. The lesion at T3 measures 8 mm maximally. There is no collapse at either level. Marrow signal is somewhat heterogeneous without  other focal lesions. There is no dural or epidural enhancement to suggest metastatic disease within the spinal canal. An enhancing posterior medial right upper lobe mass at the T5 and T6 level measures 4.8 x 4.1 cm. There is no direct encroachment into the foramina. There is invasion into the adjacent rib with possible neuro involvement along the undersurface of the T5 or T6 ribs.  Vertebral body heights and alignment are maintained.  No significant disc protrusions are present. Asymmetric left-sided facet hypertrophy at T11-12 results in minimal left foraminal narrowing.  MR LUMBAR SPINE FINDINGS  Normal signal is present in the conus medullaris which  terminates at L1-2, within normal limits. The patient is status post spinal augmentation at L3. Leftward curvature of the lumbar spine is centered at L4. Chronic sclerotic changes are present anteriorly at L2 and L3. There is enhancement posteriorly within the L3 vertebral body compatible with metastasis. An metastasis scratch of the metastasis at L1 is stable. A 4 mm focus of enhancement is seen just to the right of midline at S1. No other foci of metastatic disease are evident.  There is straightening and some reversal of the normal lumbar lordosis. Slight retrolisthesis at L3-4 is stable.  Limited imaging of the abdomen is unremarkable.  L1-2:  Negative.  L2-3: Slight anterolisthesis is present. There is uncovering of a leftward disc protrusion. Mild left lateral recess narrowing is present.  L3-4: A leftward disc protrusion is present. Mild left lateral recess and foraminal narrowing is present.  L4-5: A rightward disc protrusion is present. Mild right lateral recess narrowing is evident. The right foramen is patent.  L5-S1: A leftward disc protrusion is present. Mild facet hypertrophy is worse on the left. This results in mild left lateral recess and foraminal stenosis.  IMPRESSION: 1. No evidence for metastatic disease within the spinal canal or significant cord  compromise. 2. Spinal augmentation at L3-4 for a pathologic fracture with some retropulsion of bone. 3. Leftward disc protrusions at L2-3 and L3-4 with mild lateral recess narrowing at both levels. 4. Enhancing metastases at T3, L2, and S1 as described. There is no collapse of these levels. 5. 4.8 cm medial posterior right upper lobe enhancing mass without direct invasion into the spinal canal or neural foramina. There does appear to be invasion into the ribs, likely affecting the T5 and T6 nerves along the undersurface of the ribs.   Electronically Signed   By: Lawrence Santiago M.D.   On: 11/25/2013 14:09   Dg Bone Density  10/30/2013   EXAM: DG DEXA BONE DENSITY STUDY  The Bone Mineral Densitometry hard-copy report (which includes all data, graphical display, and FRAX results when applicable) has been sent directly to the ordering physician.  This report can also be obtained electronically by viewing images for this exam through the performing facility's EMR, or by logging directly into [employer].   Electronically Signed   By: Conchita Paris M.D.   On: 10/30/2013 15:39   ASSESSMENT AND PLAN:  This is a very pleasant 80 years old white female with metastatic non-small cell lung cancer diagnosed in March 2006 and has been on treatment with several chemotherapy regimen including almost 5 years of Tarceva 150 mg by mouth daily and she is currently on Tarceva 100 mg by mouth daily and tolerating it fairly well with no significant skin rash or diarrhea. She status post 11 months of treatment. The recent CT scan of the chest, abdomen and pelvis showed evidence for disease progression in the right lower lobe lung mass in addition to left adrenal gland. I discussed the scan results and showed the images to the patient today. I recommended for her to have CT-guided core biopsy of the right lower lobe lung mass to send for molecular biomarkers to see the patient develop any resistant EGFR mutations. I recommended  for her to continue her current treatment with Tarceva 100 mg by mouth daily until complete evaluation of her tumor biomarkers. For the persistent anemia, I will start the patient on Integra plus  capsule by mouth daily.  For the urinary retention, and she has a Foley's catheter placed  and is followed by urology. She would come back for followup visit in one month's for reevaluation and management any adverse effect of her treatment. She was advised to call immediately if she has any concerning symptoms in the interval  The patient voices understanding of current disease status and treatment options and is in agreement with the current care plan.  All questions were answered. The patient knows to call the clinic with any problems, questions or concerns. We can certainly see the patient much sooner if necessary.  Disclaimer: This note was dictated with voice recognition software. Similar sounding words can inadvertently be transcribed and may not be corrected upon review.

## 2013-11-27 NOTE — Telephone Encounter (Signed)
gv and printed appt sched and avs fo rpt for July °

## 2013-11-29 ENCOUNTER — Other Ambulatory Visit: Payer: Self-pay | Admitting: Radiology

## 2013-12-02 ENCOUNTER — Ambulatory Visit (HOSPITAL_COMMUNITY)
Admission: RE | Admit: 2013-12-02 | Discharge: 2013-12-02 | Disposition: A | Discharge status: Home / Self Care | Payer: Medicare Other | Source: Ambulatory Visit | Attending: Internal Medicine | Admitting: Internal Medicine

## 2013-12-02 ENCOUNTER — Encounter (HOSPITAL_COMMUNITY): Payer: Self-pay

## 2013-12-02 DIAGNOSIS — Z85118 Personal history of other malignant neoplasm of bronchus and lung: Secondary | ICD-10-CM | POA: Insufficient documentation

## 2013-12-02 DIAGNOSIS — C3410 Malignant neoplasm of upper lobe, unspecified bronchus or lung: Secondary | ICD-10-CM | POA: Insufficient documentation

## 2013-12-02 DIAGNOSIS — E279 Disorder of adrenal gland, unspecified: Secondary | ICD-10-CM | POA: Insufficient documentation

## 2013-12-02 DIAGNOSIS — C3490 Malignant neoplasm of unspecified part of unspecified bronchus or lung: Secondary | ICD-10-CM

## 2013-12-02 DIAGNOSIS — I10 Essential (primary) hypertension: Secondary | ICD-10-CM | POA: Insufficient documentation

## 2013-12-02 LAB — PROTIME-INR
INR: 1.1 (ref 0.00–1.49)
Prothrombin Time: 14 seconds (ref 11.6–15.2)

## 2013-12-02 LAB — CBC
HEMATOCRIT: 28.3 % — AB (ref 36.0–46.0)
Hemoglobin: 9.2 g/dL — ABNORMAL LOW (ref 12.0–15.0)
MCH: 32.4 pg (ref 26.0–34.0)
MCHC: 32.5 g/dL (ref 30.0–36.0)
MCV: 99.6 fL (ref 78.0–100.0)
PLATELETS: 188 10*3/uL (ref 150–400)
RBC: 2.84 MIL/uL — ABNORMAL LOW (ref 3.87–5.11)
RDW: 13.5 % (ref 11.5–15.5)
WBC: 5.1 10*3/uL (ref 4.0–10.5)

## 2013-12-02 LAB — APTT: aPTT: 31 seconds (ref 24–37)

## 2013-12-02 MED ORDER — LIDOCAINE HCL 1 % IJ SOLN
INTRAMUSCULAR | Status: AC
  Filled 2013-12-02: qty 10

## 2013-12-02 MED ORDER — LIDOCAINE-EPINEPHRINE 1 %-1:100000 IJ SOLN
INTRAMUSCULAR | Status: AC
  Filled 2013-12-02: qty 1

## 2013-12-02 MED ORDER — FENTANYL CITRATE 0.05 MG/ML IJ SOLN
INTRAMUSCULAR | Status: AC
  Filled 2013-12-02: qty 4

## 2013-12-02 MED ORDER — FENTANYL CITRATE 0.05 MG/ML IJ SOLN
INTRAMUSCULAR | Status: AC | PRN
  Administered 2013-12-02 (×2): 25 ug via INTRAVENOUS

## 2013-12-02 MED ORDER — SODIUM CHLORIDE 0.9 % IV SOLN
Freq: Once | INTRAVENOUS | Status: AC
  Administered 2013-12-02: 10:00:00 via INTRAVENOUS

## 2013-12-02 MED ORDER — MIDAZOLAM HCL 2 MG/2ML IJ SOLN
INTRAMUSCULAR | Status: AC
  Filled 2013-12-02: qty 4

## 2013-12-02 MED ORDER — MIDAZOLAM HCL 2 MG/2ML IJ SOLN
INTRAMUSCULAR | Status: AC | PRN
  Administered 2013-12-02 (×2): 0.5 mg via INTRAVENOUS

## 2013-12-02 NOTE — Discharge Instructions (Signed)
Needle Biopsy of Lung, Care After Refer to this sheet in the next few weeks. These instructions provide you with information on caring for yourself after your procedure. Your health care provider may also give you more specific instructions. Your treatment has been planned according to current medical practices, but problems sometimes occur. Call your health care provider if you have any problems or questions after your procedure. WHAT TO EXPECT AFTER THE PROCEDURE A bandage will be applied over the areas where the needle was inserted. You may be asked to apply pressure to the bandage for several minutes to ensure there is minimal bleeding. In most cases, you can leave when your needle biopsy procedure is completed. Do not drive yourself home. Someone else should take you home. If you received an IV sedative or general anesthetic, you will be taken to a comfortable place to relax while the medication wears off. If you have upcoming travel scheduled, talk to your doctor about when it is safe to travel by air after the procedure. HOME CARE INSTRUCTIONS Expect to take it easy for the rest of the day. Protect the area where you received the needle biopsy by keeping the bandage in place for as long as instructed. You may feel some mild pain or discomfort in the area, but this should stop in a day or two. Only take over-the-counter or prescription medicines for pain, discomfort, or fever as directed by your caregiver. SEEK MEDICAL CARE IF:   You have pain at the biopsy site that worsens or is not helped by medication.  You have swelling or drainage at the needle biopsy site.  You have a fever. SEEK IMMEDIATE MEDICAL CARE IF:   You have new or worsening shortness of breath.  You have chest pain.  You are coughing up blood.  You have bleeding that does not stop with pressure or a bandage.  You develop light-headedness or fainting. Document Released: 04/03/2007 Document Revised: 02/06/2013 Document  Reviewed: 10/29/2012 [facility] Patient Information 2014 [place].

## 2013-12-02 NOTE — Sedation Documentation (Signed)
D/c O2 sats 98

## 2013-12-02 NOTE — Procedures (Signed)
Technically successful CT guided biopsy of enlarging right upper lobe pulmonary mass.  No immediate post procedural complications.

## 2013-12-02 NOTE — H&P (Signed)
Jamie Munoz is an 80 y.o. female.   Chief Complaint: Pt with known hx non small cell lung cancer: 2006 Hx spinal mets also Was seen in follow up with Dr Delene Ruffini and imaging revealed enlarging Rt Lower lobe lung mass and new left adrenal mass Pt now scheduled for biopsy of RLL lung mass for molecular biomarkers  HPI: Lung cancer; spinal mets; HTN; L adrenal mass   Past Medical History  Diagnosis Date  . Cancer   . Lung cancer   . Hypertension   . Cellulitis of left lower extremity 07/16/2012    Past Surgical History  Procedure Laterality Date  . Cholecystectomy    . Abdominal hysterectomy    . Breast surgery    . Vaginal sling      Family History  Problem Relation Age of Onset  . Cancer Mother   . Heart attack Father   . Cancer Brother   . Cancer Brother   . Cancer Brother    Social History:  reports that she has never smoked. She has never used smokeless tobacco. She reports that she does not drink alcohol or use illicit drugs.  Allergies:  Allergies  Allergen Reactions  . Codeine Shortness Of Breath     (Not in a hospital admission)  Results for orders placed during the hospital encounter of 12/02/13 (from the past 48 hour(s))  APTT     Status: None   Collection Time    12/02/13  9:49 AM      Result Value Ref Range   aPTT 31  24 - 37 seconds  CBC     Status: Abnormal   Collection Time    12/02/13  9:49 AM      Result Value Ref Range   WBC 5.1  4.0 - 10.5 K/uL   RBC 2.84 (*) 3.87 - 5.11 MIL/uL   Hemoglobin 9.2 (*) 12.0 - 15.0 g/dL   HCT 28.3 (*) 36.0 - 46.0 %   MCV 99.6  78.0 - 100.0 fL   MCH 32.4  26.0 - 34.0 pg   MCHC 32.5  30.0 - 36.0 g/dL   RDW 13.5  11.5 - 15.5 %   Platelets 188  150 - 400 K/uL  PROTIME-INR     Status: None   Collection Time    12/02/13  9:49 AM      Result Value Ref Range   Prothrombin Time 14.0  11.6 - 15.2 seconds   INR 1.10  0.00 - 1.49   No results found.  Review of Systems  Constitutional: Positive for  malaise/fatigue. Negative for fever and weight loss.  Cardiovascular: Negative for chest pain.  Gastrointestinal: Negative for nausea, vomiting and abdominal pain.  Musculoskeletal: Positive for back pain.  Neurological: Positive for weakness. Negative for dizziness and headaches.  Psychiatric/Behavioral: Negative for substance abuse.    Blood pressure 135/49, pulse 77, temperature 97.9 F (36.6 C), temperature source Oral, resp. rate 18, height 5\' 6"  (1.676 m), weight 56.898 kg (125 lb 7 oz), SpO2 99.00%. Physical Exam  Constitutional: She is oriented to person, place, and time. She appears well-developed.  Cardiovascular: Normal rate.   No murmur heard. Respiratory: Effort normal. She has no wheezes.  GI: Soft. Bowel sounds are normal. There is no tenderness.  Musculoskeletal: Normal range of motion.  Neurological: She is alert and oriented to person, place, and time.  Skin: Skin is warm and dry.  Psychiatric: She has a normal mood and affect. Her behavior is normal. Judgment  and thought content normal.     Assessment/Plan Hx NSC lung ca Enlarging RLL lung mass New L adrenal mass Scheduled for RLL lung mass bx for molecular biomarkers Pt aware of procedure benefits and risks and agreeable to proceed Consent signed and in chart  Aireana Ryland A 12/02/2013, 10:47 AM

## 2013-12-11 ENCOUNTER — Telehealth: Payer: Self-pay | Admitting: *Deleted

## 2013-12-11 ENCOUNTER — Other Ambulatory Visit: Payer: Self-pay | Admitting: *Deleted

## 2013-12-11 NOTE — Telephone Encounter (Signed)
Pt called stating that she was started on Ampicillin 500mg  QID x 5 days prescribed by Dr Jasmine December at [facility].  She took 2 tabs yesterday and noticed that her leg has broken out in white blisters.  She called Dr Jasmine December and they advised she continue to take the antibiotic and let Dr Vista Mink know.  Per Dr Vista Mink, okay for patient to take the antibiotic from an oncology standpoint, but she should hold the tarceva for now while on the ampicillin.  Pt has not had the tarceva x 5 days b/c she ran out of it.  She will continue to hold the tarceva.  Pt verbalized understanding to continue the antibiotics.  Called and spoke to [place] with Dr Jasmine December and informed her of Dr [facility] plan and that he is okay for pt to continue the ampicillin as advised by Dr Jasmine December.  SLJ

## 2013-12-16 ENCOUNTER — Other Ambulatory Visit: Payer: Self-pay | Admitting: Internal Medicine

## 2013-12-19 ENCOUNTER — Other Ambulatory Visit (HOSPITAL_COMMUNITY)
Admission: RE | Admit: 2013-12-19 | Discharge: 2013-12-19 | Disposition: A | Discharge status: Home / Self Care | Payer: Medicare Other | Source: Ambulatory Visit | Attending: Internal Medicine | Admitting: Internal Medicine

## 2013-12-19 DIAGNOSIS — C3490 Malignant neoplasm of unspecified part of unspecified bronchus or lung: Secondary | ICD-10-CM | POA: Insufficient documentation

## 2013-12-25 ENCOUNTER — Telehealth: Payer: Self-pay | Admitting: Internal Medicine

## 2013-12-25 ENCOUNTER — Other Ambulatory Visit: Payer: Self-pay | Admitting: *Deleted

## 2013-12-25 ENCOUNTER — Other Ambulatory Visit (HOSPITAL_BASED_OUTPATIENT_CLINIC_OR_DEPARTMENT_OTHER): Payer: Medicare Other

## 2013-12-25 ENCOUNTER — Ambulatory Visit: Payer: Medicare Other

## 2013-12-25 ENCOUNTER — Ambulatory Visit (HOSPITAL_BASED_OUTPATIENT_CLINIC_OR_DEPARTMENT_OTHER): Payer: Medicare Other

## 2013-12-25 ENCOUNTER — Ambulatory Visit (HOSPITAL_BASED_OUTPATIENT_CLINIC_OR_DEPARTMENT_OTHER): Payer: Medicare Other | Admitting: Internal Medicine

## 2013-12-25 ENCOUNTER — Encounter: Payer: Self-pay | Admitting: Internal Medicine

## 2013-12-25 ENCOUNTER — Ambulatory Visit (HOSPITAL_COMMUNITY)
Admission: RE | Admit: 2013-12-25 | Discharge: 2013-12-25 | Disposition: A | Discharge status: Home / Self Care | Payer: Medicare Other | Source: Ambulatory Visit | Attending: Internal Medicine | Admitting: Internal Medicine

## 2013-12-25 VITALS — BP 148/59 | HR 78 | Temp 98.1°F | Resp 17 | Ht 66.0 in | Wt 130.1 lb

## 2013-12-25 VITALS — BP 165/50 | HR 67 | Temp 99.0°F | Resp 18

## 2013-12-25 DIAGNOSIS — D6481 Anemia due to antineoplastic chemotherapy: Secondary | ICD-10-CM | POA: Diagnosis present

## 2013-12-25 DIAGNOSIS — R42 Dizziness and giddiness: Secondary | ICD-10-CM

## 2013-12-25 DIAGNOSIS — C3491 Malignant neoplasm of unspecified part of right bronchus or lung: Secondary | ICD-10-CM

## 2013-12-25 DIAGNOSIS — T451X5A Adverse effect of antineoplastic and immunosuppressive drugs, initial encounter: Secondary | ICD-10-CM | POA: Diagnosis not present

## 2013-12-25 DIAGNOSIS — C7951 Secondary malignant neoplasm of bone: Secondary | ICD-10-CM

## 2013-12-25 DIAGNOSIS — C7952 Secondary malignant neoplasm of bone marrow: Secondary | ICD-10-CM

## 2013-12-25 DIAGNOSIS — C3490 Malignant neoplasm of unspecified part of unspecified bronchus or lung: Secondary | ICD-10-CM

## 2013-12-25 DIAGNOSIS — C3410 Malignant neoplasm of upper lobe, unspecified bronchus or lung: Secondary | ICD-10-CM

## 2013-12-25 DIAGNOSIS — R5383 Other fatigue: Secondary | ICD-10-CM

## 2013-12-25 DIAGNOSIS — R339 Retention of urine, unspecified: Secondary | ICD-10-CM

## 2013-12-25 DIAGNOSIS — D649 Anemia, unspecified: Secondary | ICD-10-CM

## 2013-12-25 DIAGNOSIS — R0609 Other forms of dyspnea: Secondary | ICD-10-CM

## 2013-12-25 DIAGNOSIS — R5381 Other malaise: Secondary | ICD-10-CM

## 2013-12-25 DIAGNOSIS — R0989 Other specified symptoms and signs involving the circulatory and respiratory systems: Secondary | ICD-10-CM

## 2013-12-25 DIAGNOSIS — M549 Dorsalgia, unspecified: Secondary | ICD-10-CM

## 2013-12-25 LAB — COMPREHENSIVE METABOLIC PANEL (CC13)
ALT: 14 U/L (ref 0–55)
ANION GAP: 5 meq/L (ref 3–11)
AST: 18 U/L (ref 5–34)
Albumin: 3.2 g/dL — ABNORMAL LOW (ref 3.5–5.0)
Alkaline Phosphatase: 70 U/L (ref 40–150)
BILIRUBIN TOTAL: 0.28 mg/dL (ref 0.20–1.20)
BUN: 15.4 mg/dL (ref 7.0–26.0)
CALCIUM: 7.1 mg/dL — AB (ref 8.4–10.4)
CHLORIDE: 109 meq/L (ref 98–109)
CO2: 23 mEq/L (ref 22–29)
Creatinine: 1.3 mg/dL — ABNORMAL HIGH (ref 0.6–1.1)
Glucose: 84 mg/dl (ref 70–140)
Potassium: 5 mEq/L (ref 3.5–5.1)
SODIUM: 137 meq/L (ref 136–145)
TOTAL PROTEIN: 6.2 g/dL — AB (ref 6.4–8.3)

## 2013-12-25 LAB — CBC WITH DIFFERENTIAL/PLATELET
BASO%: 0.4 % (ref 0.0–2.0)
Basophils Absolute: 0 10*3/uL (ref 0.0–0.1)
EOS%: 3.1 % (ref 0.0–7.0)
Eosinophils Absolute: 0.2 10*3/uL (ref 0.0–0.5)
HCT: 24.9 % — ABNORMAL LOW (ref 34.8–46.6)
HGB: 8.2 g/dL — ABNORMAL LOW (ref 11.6–15.9)
LYMPH#: 0.8 10*3/uL — AB (ref 0.9–3.3)
LYMPH%: 11.5 % — ABNORMAL LOW (ref 14.0–49.7)
MCH: 32.8 pg (ref 25.1–34.0)
MCHC: 33.1 g/dL (ref 31.5–36.0)
MCV: 99 fL (ref 79.5–101.0)
MONO#: 0.7 10*3/uL (ref 0.1–0.9)
MONO%: 9.8 % (ref 0.0–14.0)
NEUT#: 5 10*3/uL (ref 1.5–6.5)
NEUT%: 75.2 % (ref 38.4–76.8)
Platelets: 220 10*3/uL (ref 145–400)
RBC: 2.51 10*6/uL — AB (ref 3.70–5.45)
RDW: 14.2 % (ref 11.2–14.5)
WBC: 6.6 10*3/uL (ref 3.9–10.3)

## 2013-12-25 LAB — PREPARE RBC (CROSSMATCH)

## 2013-12-25 LAB — HOLD TUBE, BLOOD BANK

## 2013-12-25 MED ORDER — ERLOTINIB HCL 100 MG PO TABS: 100.0000 mg | ORAL_TABLET | Freq: Every day | ORAL | Status: DC

## 2013-12-25 MED ORDER — SODIUM CHLORIDE 0.9 % IJ SOLN
10.0000 mL | INTRAMUSCULAR | Status: AC | PRN
  Administered 2013-12-25: 10 mL
  Filled 2013-12-25: qty 10

## 2013-12-25 MED ORDER — DIPHENHYDRAMINE HCL 25 MG PO CAPS
25.0000 mg | ORAL_CAPSULE | Freq: Once | ORAL | Status: AC
  Administered 2013-12-25: 25 mg via ORAL

## 2013-12-25 MED ORDER — ACETAMINOPHEN 325 MG PO TABS
650.0000 mg | ORAL_TABLET | Freq: Once | ORAL | Status: AC
  Administered 2013-12-25: 650 mg via ORAL

## 2013-12-25 MED ORDER — ACETAMINOPHEN 325 MG PO TABS
ORAL_TABLET | ORAL | Status: AC
  Filled 2013-12-25: qty 2

## 2013-12-25 MED ORDER — HEPARIN SOD (PORK) LOCK FLUSH 100 UNIT/ML IV SOLN
500.0000 [IU] | Freq: Every day | INTRAVENOUS | Status: AC | PRN
  Administered 2013-12-25: 500 [IU]
  Filled 2013-12-25: qty 5

## 2013-12-25 MED ORDER — DENOSUMAB 120 MG/1.7ML ~~LOC~~ SOLN
120.0000 mg | Freq: Once | SUBCUTANEOUS | Status: AC
  Administered 2013-12-25: 120 mg via SUBCUTANEOUS
  Filled 2013-12-25: qty 1.7

## 2013-12-25 MED ORDER — SODIUM CHLORIDE 0.9 % IV SOLN
250.0000 mL | Freq: Once | INTRAVENOUS | Status: AC
  Administered 2013-12-25: 250 mL via INTRAVENOUS

## 2013-12-25 MED ORDER — DIPHENHYDRAMINE HCL 25 MG PO CAPS
ORAL_CAPSULE | ORAL | Status: AC
  Filled 2013-12-25: qty 1

## 2013-12-25 NOTE — Progress Notes (Signed)
[place] [phone]   [phone]  OFFICE PROGRESS NOTE  [name], MD [address] [place] [address]  DIAGNOSIS: Metastatic non-small cell lung cancer (T2 NX M1). Presented with right upper lobe lung mass as well as multiple pulmonary nodules and bone metastasis at L3 vertebral body. This was diagnosed in March of 2006.   PRIOR THERAPY:  1. Status post palliative radiotherapy to the L3 lesion under the care of Dr. Tammi Klippel completed September 17, 2004. 2. Status post 6 cycles of systemic chemotherapy with carboplatin and paclitaxel. Last dose was given January 01, 2005. 3. Status post palliative radiotherapy to the right upper lobe lung mass under the care of Dr. Tammi Klippel completed on 12/29/2010. 4. Status post treatment with Tarceva 150 mg p.o. daily started April 2007 through July 2012. 5. Tarceva 100 mg p.o. daily started July 2012. 6. Systemic chemotherapy with Carboplatin for AUC 5 and Alimta 500 mg/m2 given every 3 weeks. Status post 6 cycles. 7. Maintenance chemotherapy with single agent Alimta at 500 mg per meter squared given every 3 weeks, status post 1 cycle given on 07/30/2012.  CURRENT THERAPY:  1. Zometa 4 mg IV q. 3 months for bone metastasis. 2. Tarceva 100 mg by mouth daily resumed therapy on 01/07/13  CHEMOTHERAPY INTENT: Palliative  CURRENT # OF CHEMOTHERAPY CYCLES: 12 CURRENT ANTIEMETICS: Compazine  CURRENT SMOKING STATUS: Never Smoker  ORAL CHEMOTHERAPY AND CONSENT: yes  CURRENT BISPHOSPHONATES USE: Zometa every 3 months  PAIN MANAGEMENT: OxyContin  NARCOTICS INDUCED CONSTIPATION: yes but well managed with Senokot-S and as needed Dulcolax  LIVING WILL AND CODE STATUS: Full code  INTERVAL HISTORY: Jamie Munoz 80 y.o. female returns to the clinic today for followup visit accompanied by her niece. The patient is complaining of increasing fatigue and some dizzy spells recently. She was also recently found to have  urinary retention and she has a Foley's catheter in place. She was treated for a urinary tract infection by her Urologist with ampicillin. She was also given prescription for Septra but has not started it yet. The patient has been off Tarceva for the last few weeks. She underwent CT-guided core biopsy of the right lower lobe lung mass and the final pathology was consistent with adenocarcinoma. The patient denied having any skin rash or diarrhea. She continues to have shortness of breath with exertion but no significant chest pain, cough or hemoptysis. She is here today for evaluation and discussion of her biopsy results and recommendation regarding treatment of her condition.  MEDICAL HISTORY: Past Medical History  Diagnosis Date  . Cancer   . Lung cancer   . Hypertension   . Cellulitis of left lower extremity 07/16/2012    ALLERGIES:  is allergic to codeine.  MEDICATIONS:  Current Outpatient Prescriptions  Medication Sig Dispense Refill  . bisacodyl (DULCOLAX) 5 MG EC tablet Take 5 mg by mouth daily as needed for constipation.       . docusate sodium (COLACE) 100 MG capsule Take 100 mg by mouth 2 (two) times daily as needed for constipation.       . gabapentin (NEURONTIN) 100 MG capsule Take 100 mg by mouth 3 (three) times daily.      Marland Kitchen gabapentin (NEURONTIN) 100 MG capsule TAKE (2) CAPSULES THREE TIMES DAILY.  180 capsule  1  . olmesartan (BENICAR) 20 MG tablet Take 10 mg by mouth daily.       . OxyCODONE (OXYCONTIN) 20 mg  T12A 12 hr tablet Take 1 tablet (20 mg total) by mouth at bedtime.  60 tablet  0  . polyethylene glycol (MIRALAX / GLYCOLAX) packet Take 17 g by mouth 2 (two) times daily.  14 each    . senna-docusate (SENOKOT-S) 8.6-50 MG per tablet Take 1 tablet by mouth at bedtime.      . sulfamethoxazole-trimethoprim (BACTRIM DS) 800-160 MG per tablet Take 1 tablet by mouth daily.       No current facility-administered medications for this visit.   Facility-Administered Medications  Ordered in Other Visits  Medication Dose Route Frequency Provider Last Rate Last Dose  . influenza  inactive virus vaccine (FLUZONE/FLUARIX) injection 0.5 mL  0.5 mL Intramuscular Once Curt Bears, MD      . sodium chloride 0.9 % injection 10 mL  10 mL Intravenous PRN Curt Bears, MD   10 mL at 11/01/13 0948    SURGICAL HISTORY:  Past Surgical History  Procedure Laterality Date  . Cholecystectomy    . Abdominal hysterectomy    . Breast surgery    . Vaginal sling      REVIEW OF SYSTEMS:  Constitutional: positive for fatigue Eyes: negative Ears, nose, mouth, throat, and face: negative Respiratory: negative Cardiovascular: negative Gastrointestinal: negative Genitourinary:positive for decreased stream and Urinary retention Integument/breast: negative Hematologic/lymphatic: negative Musculoskeletal:positive for back pain Neurological: negative Behavioral/Psych: negative Endocrine: negative Allergic/Immunologic: negative   PHYSICAL EXAMINATION: General appearance: alert, cooperative and no distress Head: Normocephalic, without obvious abnormality, atraumatic Neck: no adenopathy, no JVD, supple, symmetrical, trachea midline and thyroid not enlarged, symmetric, no tenderness/mass/nodules Lymph nodes: Cervical, supraclavicular, and axillary nodes normal. Resp: clear to auscultation bilaterally Back: symmetric, no curvature. ROM normal. No CVA tenderness. Cardio: regular rate and rhythm, S1, S2 normal, no murmur, click, rub or gallop GI: soft, non-tender; bowel sounds normal; no masses,  no organomegaly Extremities: extremities normal, atraumatic, no cyanosis or edema Neurologic: Alert and oriented X 3, normal strength and tone. Normal symmetric reflexes. Normal coordination and gait  ECOG PERFORMANCE STATUS: 1 - Symptomatic but completely ambulatory  Blood pressure 148/59, pulse 78, temperature 98.1 F (36.7 C), temperature source Oral, resp. rate 17, height $RemoveBe'5\' 6"'aWiJcmAvj$  (1.676  m), weight 130 lb 1.6 oz (59.013 kg), SpO2 91.00%.  LABORATORY DATA: Lab Results  Component Value Date   WBC 6.6 12/25/2013   HGB 8.2* 12/25/2013   HCT 24.9* 12/25/2013   MCV 99.0 12/25/2013   PLT 220 12/25/2013      Chemistry      Component Value Date/Time   NA 141 11/27/2013 1026   NA 138 11/25/2013 1040   NA 139 09/02/2011 1406   K 4.2 11/27/2013 1026   K 4.1 11/25/2013 1040   K 3.9 09/02/2011 1406   CL 104 11/25/2013 1040   CL 105 10/02/2012 1056   CL 97* 09/02/2011 1406   CO2 25 11/27/2013 1026   CO2 21 11/25/2013 1040   CO2 29 09/02/2011 1406   BUN 25.8 11/27/2013 1026   BUN 33* 11/25/2013 1040   BUN 18 09/02/2011 1406   CREATININE 1.4* 11/27/2013 1026   CREATININE 1.60* 11/25/2013 1040   CREATININE 1.2 09/02/2011 1406      Component Value Date/Time   CALCIUM 7.9* 11/27/2013 1026   CALCIUM 7.9* 11/25/2013 1040   CALCIUM 8.0 09/02/2011 1406   ALKPHOS 65 11/27/2013 1026   ALKPHOS 62 11/25/2013 1040   ALKPHOS 71 09/02/2011 1406   AST 18 11/27/2013 1026   AST 19 11/25/2013 1040  AST 25 09/02/2011 1406   ALT 10 11/27/2013 1026   ALT 10 11/25/2013 1040   ALT 20 09/02/2011 1406   BILITOT 0.56 11/27/2013 1026   BILITOT 0.6 11/25/2013 1040   BILITOT 0.60 09/02/2011 1406       RADIOGRAPHIC STUDIES: Ct Biopsy  12/02/2013   INDICATION: History of lung cancer, now with enlarging right upper lobe pulmonary mass. Please perform CT-guided biopsy to obtain tissue for molecular analysis.  EXAM: CT BIOPSY OF ENLARGING RIGHT UPPER LOBE PULMONARY MASS  COMPARISON:  CT the chest, abdomen and pelvis - 11/25/2013; 07/23/2013; 04/17/2013  MEDICATIONS: Fentanyl 75 mcg IV; Versed 1.5 mg IV  ANESTHESIA/SEDATION: Sedation time  28 minutes  CONTRAST:  None  COMPLICATIONS: None immediate.  PROCEDURE: Informed consent was obtained from the patient following an explanation of the procedure, risks, benefits and alternatives. The patient understands,agrees and consents for the procedure. All questions were addressed. A time out was performed  prior to the initiation of the procedure.  The patient was positioned prone on the CT table and a limited chest CT was performed for procedural planning demonstrating grossly unchanged appearance of the approximately 4.4 x 4.0 cm mass within the medial aspect the right upper lobe (image 18, series 2). The operative site was prepped and draped in the usual sterile fashion. Under sterile conditions and local anesthesia, a 17 gauge coaxial needle was advanced into the peripheral aspect of the nodule. Positioning was confirmed with intermittent CT fluoroscopy and followed by the acquisition of 4 core needle biopsies with an 18 gauge core needle biopsy device.  The co-axial needle was removed and hemostasis was achieved with manual compression. A dressing was placed. The patient tolerated the procedure well without immediate postprocedural complication. The patient was escorted to have an upright chest radiograph.  IMPRESSION: Technically successful CT guided core needle core biopsy of enlarging right upper lobe pulmonary mass for molecular analysis.   Electronically Signed   By: Sandi Mariscal M.D.   On: 12/02/2013 13:06   ASSESSMENT AND PLAN:  This is a very pleasant 80 years old white female with metastatic non-small cell lung cancer diagnosed in March 2006 and has been on treatment with several chemotherapy regimen including almost 5 years of Tarceva 150 mg by mouth daily and she is currently on Tarceva 100 mg by mouth daily and tolerating it fairly well with no significant skin rash or diarrhea. She status post 11 months of treatment. She has been off Tarceva for the last few weeks.  The recent pathology report showed adenocarcinoma and I would ask the pathology Department to send her tissue to be tested for EGFR resistance mutation. I recommended for her to resume her current treatment with Tarceva 100 mg by mouth daily until complete evaluation of her tumor biomarkers. For the anemia, I will arrange for the  patient to receive 2 units of PRBCs transfusion today and she will continue on Integra plus. For the urinary retention, and she has a Foley's catheter placed and is followed by urology. She would come back for followup visit in one month for reevaluation and management any adverse effect of her treatment. She was advised to call immediately if she has any concerning symptoms in the interval  The patient voices understanding of current disease status and treatment options and is in agreement with the current care plan.  All questions were answered. The patient knows to call the clinic with any problems, questions or concerns. We can certainly see the patient much  sooner if necessary.  Disclaimer: This note was dictated with voice recognition software. Similar sounding words can inadvertently be transcribed and may not be corrected upon review.

## 2013-12-25 NOTE — Patient Instructions (Signed)

## 2013-12-25 NOTE — Telephone Encounter (Signed)
Gave pt appt for lab and MD  for August 2015

## 2013-12-26 ENCOUNTER — Encounter: Payer: Self-pay | Admitting: Internal Medicine

## 2013-12-26 LAB — TYPE AND SCREEN
ABO/RH(D): AB POS
ANTIBODY SCREEN: NEGATIVE
UNIT DIVISION: 0
Unit division: 0

## 2013-12-26 NOTE — Progress Notes (Signed)
Per Helene Kelp at [facility] patient approved for Tarceva 7500.00  11/22/13-11/22/14. I will send to billing and medical records.

## 2013-12-27 ENCOUNTER — Ambulatory Visit: Payer: Medicare Other

## 2013-12-27 ENCOUNTER — Other Ambulatory Visit: Payer: Medicare Other

## 2014-01-02 ENCOUNTER — Encounter (HOSPITAL_COMMUNITY): Payer: Self-pay

## 2014-01-19 ENCOUNTER — Encounter (HOSPITAL_COMMUNITY): Payer: Self-pay | Admitting: Emergency Medicine

## 2014-01-19 ENCOUNTER — Emergency Department (HOSPITAL_COMMUNITY)
Admission: EM | Admit: 2014-01-19 | Discharge: 2014-01-19 | Disposition: A | Discharge status: Home / Self Care | Payer: Medicare Other | Attending: Emergency Medicine | Admitting: Emergency Medicine

## 2014-01-19 DIAGNOSIS — Y846 Urinary catheterization as the cause of abnormal reaction of the patient, or of later complication, without mention of misadventure at the time of the procedure: Secondary | ICD-10-CM | POA: Diagnosis not present

## 2014-01-19 DIAGNOSIS — Z9889 Other specified postprocedural states: Secondary | ICD-10-CM | POA: Diagnosis not present

## 2014-01-19 DIAGNOSIS — Z9221 Personal history of antineoplastic chemotherapy: Secondary | ICD-10-CM | POA: Diagnosis not present

## 2014-01-19 DIAGNOSIS — Z85830 Personal history of malignant neoplasm of bone: Secondary | ICD-10-CM | POA: Diagnosis not present

## 2014-01-19 DIAGNOSIS — Z923 Personal history of irradiation: Secondary | ICD-10-CM | POA: Diagnosis not present

## 2014-01-19 DIAGNOSIS — Z90710 Acquired absence of both cervix and uterus: Secondary | ICD-10-CM | POA: Insufficient documentation

## 2014-01-19 DIAGNOSIS — Z85118 Personal history of other malignant neoplasm of bronchus and lung: Secondary | ICD-10-CM | POA: Insufficient documentation

## 2014-01-19 DIAGNOSIS — Z79899 Other long term (current) drug therapy: Secondary | ICD-10-CM | POA: Diagnosis not present

## 2014-01-19 DIAGNOSIS — I10 Essential (primary) hypertension: Secondary | ICD-10-CM | POA: Insufficient documentation

## 2014-01-19 DIAGNOSIS — Z872 Personal history of diseases of the skin and subcutaneous tissue: Secondary | ICD-10-CM | POA: Insufficient documentation

## 2014-01-19 DIAGNOSIS — N390 Urinary tract infection, site not specified: Secondary | ICD-10-CM

## 2014-01-19 DIAGNOSIS — T83091A Other mechanical complication of indwelling urethral catheter, initial encounter: Secondary | ICD-10-CM | POA: Diagnosis not present

## 2014-01-19 DIAGNOSIS — Z9089 Acquired absence of other organs: Secondary | ICD-10-CM | POA: Insufficient documentation

## 2014-01-19 DIAGNOSIS — R338 Other retention of urine: Secondary | ICD-10-CM | POA: Diagnosis present

## 2014-01-19 DIAGNOSIS — Z792 Long term (current) use of antibiotics: Secondary | ICD-10-CM | POA: Diagnosis not present

## 2014-01-19 DIAGNOSIS — T83511A Infection and inflammatory reaction due to indwelling urethral catheter, initial encounter: Secondary | ICD-10-CM

## 2014-01-19 LAB — URINE MICROSCOPIC-ADD ON

## 2014-01-19 LAB — URINALYSIS, ROUTINE W REFLEX MICROSCOPIC
Bilirubin Urine: NEGATIVE
Glucose, UA: NEGATIVE mg/dL
Ketones, ur: NEGATIVE mg/dL
Nitrite: NEGATIVE
Protein, ur: 100 mg/dL — AB
Specific Gravity, Urine: 1.013 (ref 1.005–1.030)
UROBILINOGEN UA: 0.2 mg/dL (ref 0.0–1.0)
pH: 8.5 — ABNORMAL HIGH (ref 5.0–8.0)

## 2014-01-19 MED ORDER — CEPHALEXIN 500 MG PO CAPS: 500.0000 mg | ORAL_CAPSULE | Freq: Two times a day (BID) | ORAL | Status: AC

## 2014-01-19 NOTE — ED Notes (Signed)
MD Steinl at bedside. 

## 2014-01-19 NOTE — ED Notes (Signed)
She c/o low abd. Pain (bladder/pelvic area) since last night.  She c/o nausea, but denies vomiting/diarrhea.  She states she has a foley catheter; and she has never had this type of pain before.  She is in no distress.

## 2014-01-19 NOTE — ED Notes (Signed)
Pt c/o acute urinary retention since 6pm yesterday.

## 2014-01-19 NOTE — ED Notes (Signed)
Successful 12F foley catheter insertion with 90mL balloon. Large amount of cloudy yellow output.

## 2014-01-19 NOTE — ED Notes (Signed)
Leg bag provided by NT Magda Paganini

## 2014-01-19 NOTE — ED Provider Notes (Signed)
CSN: [id number]     Arrival date & time 01/19/14  6010 History   First MD Initiated Contact with Patient 01/19/14 0825     Chief Complaint  Patient presents with  . Abdominal Pain   HPI  Jamie Munoz is a 80 year old female with a history of T2 NX M1 metastatic non-small cell lung cancer (diagnosed in 2006) with known metastases to L3 status post multiple cycles of chemotherapy and palliative radiotherapy who presents today with abdominal pain. Patient has had a history of total abdominal hysterectomy and cholecystectomy. The patient has a long-term indwelling Foley catheter for urinary retention, managed by urology.  Patient reports that the pain is mostly suprapubic and is a 10 out of 10 feeling of discomfort that is a feeling of fullness that does not radiate. This started around 6 PM yesterday and she noted sometime afterwards yesterday that her Foley catheter had stopped draining.Patient reports a history of multiple urinary tract infections, the last which was treated several weeks ago. She does currently reports some sensation of burning around the catheter. Otherwise, she denies any fevers or chills, chest pain, shortness of breath, vomiting, or diarrhea. She does report mild nausea on the car ride to the emergency department. She also reports chronic constipation, but did have a recent bowel movement yesterday.  The patient usually gets exchange of Foley catheter every 2 weeks, but has not in about 4 weeks at this point.  Patient was found to have about 800 mL on bladder scan.  Past Medical History  Diagnosis Date  . Cancer   . Lung cancer   . Hypertension   . Cellulitis of left lower extremity 07/16/2012   Past Surgical History  Procedure Laterality Date  . Cholecystectomy    . Abdominal hysterectomy    . Breast surgery    . Vaginal sling     Family History  Problem Relation Age of Onset  . Cancer Mother   . Heart attack Father   . Cancer Brother   . Cancer Brother   .  Cancer Brother    History  Substance Use Topics  . Smoking status: Never Smoker   . Smokeless tobacco: Never Used  . Alcohol Use: No   OB History   Grav Para Term Preterm Abortions TAB SAB Ect Mult Living                 Review of Systems Per history of present illness.   Allergies  Codeine  Home Medications   Prior to Admission medications   Medication Sig Start Date End Date Taking? Authorizing Provider  bisacodyl (DULCOLAX) 5 MG EC tablet Take 5 mg by mouth daily as needed for constipation.     Historical Provider, MD  docusate sodium (COLACE) 100 MG capsule Take 100 mg by mouth 2 (two) times daily as needed for constipation.     Historical Provider, MD  erlotinib (TARCEVA) 100 MG tablet Take 1 tablet (100 mg total) by mouth daily. Take on an empty stomach 1 hour before meals or 2 hours after 12/25/13   Curt Bears, MD  gabapentin (NEURONTIN) 100 MG capsule Take 100 mg by mouth 3 (three) times daily.    Historical Provider, MD  olmesartan (BENICAR) 20 MG tablet Take 10 mg by mouth daily.     Historical Provider, MD  OxyCODONE (OXYCONTIN) 20 mg T12A 12 hr tablet Take 1 tablet (20 mg total) by mouth at bedtime. 11/27/13   Curt Bears, MD  polyethylene glycol [facility] /  GLYCOLAX) packet Take 17 g by mouth 2 (two) times daily. 08/18/12   Eugenie Filler, MD  senna-docusate (SENOKOT-S) 8.6-50 MG per tablet Take 1 tablet by mouth at bedtime. 08/18/12   Eugenie Filler, MD  sulfamethoxazole-trimethoprim (BACTRIM DS) 800-160 MG per tablet Take 1 tablet by mouth daily. 12/23/13   Historical Provider, MD   BP 170/70  Pulse 94  Temp(Src) 98.3 F (36.8 C) (Oral)  Resp 16  SpO2 97% Physical Exam  General: Pleasant, in moderate discomfort CV: Regular, tachycardic, no murmurs rubs or gallops Respiratory: Clear to auscultation bilaterally, no wheezes or rales Abdominal: Suprapubic fullness, no guarding or rebound in the upper abdomen GU: Foley catheter in place, minimal drainage  into bag, trace amounts of supplements and purulence from minimal catheter drainage  ED Course  Procedures (including critical care time) Labs Review Labs Reviewed - No data to display  Imaging Review No results found.   EKG Interpretation None      MDM   Final diagnoses:  None   Patient presenting with urinary retention secondary to an occluded Foley catheter.  - Returned approximately 1 L of urine upon exchange of Foley catheter.  - Patient has returned to baseline level of comfort.  - Urinalysis with evidence of urinary tract infection. Patient discharged with a course of cephalexin.  - Patient will followup with urology on 01/23/2014.    Luan Moore, MD 01/19/14 1106

## 2014-01-19 NOTE — ED Notes (Signed)
Foley catheter is still patent.

## 2014-01-19 NOTE — Discharge Instructions (Signed)
Please return to the emergency department if you have recurrent symptoms of urinary retention. We have prescribed antibiotics for urinary tract infection: Cephalexin 500 mg twice a day for 7 days. Please followup with your urologist for further management of your indwelling Foley catheter and further management of urinary tract infections.

## 2014-01-20 NOTE — ED Provider Notes (Signed)
I saw and evaluated the patient, reviewed the resident's note and I agree with the findings and plan.  Pt c/o suprapubic pain, urinary retention, hx same, has chronic foley not recently changed.  Old foley removed, new cath placed. No pain/abd soft nt.    Mirna Mires, MD 01/20/14 [phone]

## 2014-01-22 ENCOUNTER — Telehealth: Payer: Self-pay | Admitting: Internal Medicine

## 2014-01-22 ENCOUNTER — Encounter: Payer: Self-pay | Admitting: Internal Medicine

## 2014-01-22 ENCOUNTER — Other Ambulatory Visit (HOSPITAL_BASED_OUTPATIENT_CLINIC_OR_DEPARTMENT_OTHER): Payer: Medicare Other

## 2014-01-22 ENCOUNTER — Ambulatory Visit (HOSPITAL_BASED_OUTPATIENT_CLINIC_OR_DEPARTMENT_OTHER): Payer: Medicare Other | Admitting: Internal Medicine

## 2014-01-22 VITALS — BP 119/47 | HR 83 | Temp 97.8°F | Resp 18 | Ht 66.0 in | Wt 124.8 lb

## 2014-01-22 DIAGNOSIS — R5381 Other malaise: Secondary | ICD-10-CM

## 2014-01-22 DIAGNOSIS — C3491 Malignant neoplasm of unspecified part of right bronchus or lung: Secondary | ICD-10-CM

## 2014-01-22 DIAGNOSIS — C3410 Malignant neoplasm of upper lobe, unspecified bronchus or lung: Secondary | ICD-10-CM

## 2014-01-22 DIAGNOSIS — C7952 Secondary malignant neoplasm of bone marrow: Secondary | ICD-10-CM

## 2014-01-22 DIAGNOSIS — C7951 Secondary malignant neoplasm of bone: Secondary | ICD-10-CM

## 2014-01-22 DIAGNOSIS — M549 Dorsalgia, unspecified: Secondary | ICD-10-CM

## 2014-01-22 DIAGNOSIS — R5383 Other fatigue: Secondary | ICD-10-CM

## 2014-01-22 DIAGNOSIS — R0609 Other forms of dyspnea: Secondary | ICD-10-CM

## 2014-01-22 DIAGNOSIS — R0989 Other specified symptoms and signs involving the circulatory and respiratory systems: Secondary | ICD-10-CM

## 2014-01-22 DIAGNOSIS — R339 Retention of urine, unspecified: Secondary | ICD-10-CM

## 2014-01-22 LAB — CBC WITH DIFFERENTIAL/PLATELET
BASO%: 0.7 % (ref 0.0–2.0)
Basophils Absolute: 0 10*3/uL (ref 0.0–0.1)
EOS%: 4.7 % (ref 0.0–7.0)
Eosinophils Absolute: 0.3 10*3/uL (ref 0.0–0.5)
HCT: 29.9 % — ABNORMAL LOW (ref 34.8–46.6)
HGB: 9.7 g/dL — ABNORMAL LOW (ref 11.6–15.9)
LYMPH%: 10.7 % — AB (ref 14.0–49.7)
MCH: 31.5 pg (ref 25.1–34.0)
MCHC: 32.3 g/dL (ref 31.5–36.0)
MCV: 97.5 fL (ref 79.5–101.0)
MONO#: 0.6 10*3/uL (ref 0.1–0.9)
MONO%: 8.8 % (ref 0.0–14.0)
NEUT#: 5.1 10*3/uL (ref 1.5–6.5)
NEUT%: 75.1 % (ref 38.4–76.8)
PLATELETS: 235 10*3/uL (ref 145–400)
RBC: 3.06 10*6/uL — AB (ref 3.70–5.45)
RDW: 14.6 % — ABNORMAL HIGH (ref 11.2–14.5)
WBC: 6.8 10*3/uL (ref 3.9–10.3)
lymph#: 0.7 10*3/uL — ABNORMAL LOW (ref 0.9–3.3)

## 2014-01-22 LAB — COMPREHENSIVE METABOLIC PANEL (CC13)
ALK PHOS: 70 U/L (ref 40–150)
ALT: 7 U/L (ref 0–55)
AST: 17 U/L (ref 5–34)
Albumin: 3 g/dL — ABNORMAL LOW (ref 3.5–5.0)
Anion Gap: 8 mEq/L (ref 3–11)
BILIRUBIN TOTAL: 0.31 mg/dL (ref 0.20–1.20)
BUN: 25.8 mg/dL (ref 7.0–26.0)
CO2: 22 mEq/L (ref 22–29)
Calcium: 8.1 mg/dL — ABNORMAL LOW (ref 8.4–10.4)
Chloride: 109 mEq/L (ref 98–109)
Creatinine: 1.3 mg/dL — ABNORMAL HIGH (ref 0.6–1.1)
Glucose: 112 mg/dl (ref 70–140)
Potassium: 4.8 mEq/L (ref 3.5–5.1)
SODIUM: 139 meq/L (ref 136–145)
TOTAL PROTEIN: 6.5 g/dL (ref 6.4–8.3)

## 2014-01-22 LAB — URINE CULTURE

## 2014-01-22 MED ORDER — ERLOTINIB HCL 100 MG PO TABS: 100.0000 mg | ORAL_TABLET | Freq: Every day | ORAL | Status: DC

## 2014-01-22 MED ORDER — OXYCODONE HCL ER 20 MG PO T12A: 40.0000 mg | EXTENDED_RELEASE_TABLET | Freq: Every day | ORAL | Status: DC

## 2014-01-22 NOTE — Progress Notes (Signed)
[place] [phone]   [phone]  OFFICE PROGRESS NOTE  [name], MD [address] [place] [address]  DIAGNOSIS: Metastatic non-small cell lung cancer (T2 NX M1). Presented with right upper lobe lung mass as well as multiple pulmonary nodules and bone metastasis at L3 vertebral body. This was diagnosed in March of 2006.   Genomic Alterations Identified? EGFR [id number], T790M CXKG8J/E loss NOTCH1 splice site 5631-4H>F RICTOR amplification TP53 Y234C BCL2L2 amplification FGF10 amplification - equivocal? NFKBIA amplification NKX2-1 amplification PAX5 loss Additional Disease-relevant Genes with No Reportable Alterations Identified? ERBB2 ALK BRAF RET KRAS MET  PRIOR THERAPY:  1. Status post palliative radiotherapy to the L3 lesion under the care of Dr. Tammi Klippel completed September 17, 2004. 2. Status post 6 cycles of systemic chemotherapy with carboplatin and paclitaxel. Last dose was given January 01, 2005. 3. Status post palliative radiotherapy to the right upper lobe lung mass under the care of Dr. Tammi Klippel completed on 12/29/2010. 4. Status post treatment with Tarceva 150 mg p.o. daily started April 2007 through July 2012. 5. Tarceva 100 mg p.o. daily started July 2012. 6. Systemic chemotherapy with Carboplatin for AUC 5 and Alimta 500 mg/m2 given every 3 weeks. Status post 6 cycles. 7. Maintenance chemotherapy with single agent Alimta at 500 mg per meter squared given every 3 weeks, status post 1 cycle given on 07/30/2012.  CURRENT THERAPY:  1. Zometa 4 mg IV q. 3 months for bone metastasis. 2. Tarceva 100 mg by mouth daily resumed therapy on 01/07/13  CHEMOTHERAPY INTENT: Palliative  CURRENT # OF CHEMOTHERAPY CYCLES: 13 CURRENT ANTIEMETICS: Compazine  CURRENT SMOKING STATUS: Never Smoker  ORAL CHEMOTHERAPY AND CONSENT: yes  CURRENT BISPHOSPHONATES USE: Zometa every 3 months  PAIN MANAGEMENT: OxyContin    NARCOTICS INDUCED CONSTIPATION: yes but well managed with Senokot-S and as needed Dulcolax  LIVING WILL AND CODE STATUS: Full code  INTERVAL HISTORY: Jamie Munoz 80 y.o. female returns to the clinic today for followup visit accompanied by her niece. The patient was recently seen at the emergency department because of urinary retention and the discussed and was found to be blocked. The catheter was changed at the emergency department and the patient was started on treatment with Keflex. She was supposed to see Dr. Tennis Ship tomorrow for evaluation of her urinary retention. She also has a numb area at the anus. The patient denied having any skin rash or diarrhea. She continues to have shortness of breath with exertion but no significant chest pain, cough or hemoptysis. 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 She is here today for evaluation and discussion of her biopsy results and recommendation regarding treatment of her condition.  MEDICAL HISTORY: Past Medical History  Diagnosis Date  . Cancer   . Lung cancer   . Hypertension   . Cellulitis of left lower extremity 07/16/2012    ALLERGIES:  is allergic to codeine.  MEDICATIONS:  Current Outpatient Prescriptions  Medication Sig Dispense Refill  . bisacodyl (DULCOLAX) 5 MG EC tablet Take 5 mg by mouth daily as needed for constipation.       . cephALEXin (KEFLEX) 500 MG capsule Take 1 capsule (500 mg total) by mouth 2 (two) times daily.  14 capsule  0  . denosumab (XGEVA) 120 MG/1.7ML SOLN injection Inject 120 mg into the skin  once.      . docusate sodium (COLACE) 100 MG capsule Take 100 mg by mouth 2 (two) times daily as needed for constipation.       . erlotinib (TARCEVA) 100 MG tablet Take 1 tablet (100 mg total) by mouth daily. Take on an empty stomach 1 hour before meals or 2 hours after  30 tablet  1   . gabapentin (NEURONTIN) 100 MG capsule Take 200 mg by mouth 3 (three) times daily.       . magnesium hydroxide (MILK OF MAGNESIA) 400 MG/5ML suspension Take 30 mLs by mouth daily as needed for mild constipation.      . naproxen sodium (ANAPROX) 220 MG tablet Take 220 mg by mouth 2 (two) times daily with a meal.      . olmesartan (BENICAR) 20 MG tablet Take 20 mg by mouth daily.       . OxyCODONE (OXYCONTIN) 20 mg T12A 12 hr tablet Take 40 mg by mouth at bedtime.      . polyethylene glycol (MIRALAX / GLYCOLAX) packet Take 17 g by mouth daily with breakfast.      . zolendronic acid (ZOMETA) 4 MG/5ML injection Inject 4 mg into the vein once.       No current facility-administered medications for this visit.   Facility-Administered Medications Ordered in Other Visits  Medication Dose Route Frequency Provider Last Rate Last Dose  . influenza  inactive virus vaccine (FLUZONE/FLUARIX) injection 0.5 mL  0.5 mL Intramuscular Once Curt Bears, MD      . sodium chloride 0.9 % injection 10 mL  10 mL Intravenous PRN Curt Bears, MD   10 mL at 11/01/13 0948    SURGICAL HISTORY:  Past Surgical History  Procedure Laterality Date  . Cholecystectomy    . Abdominal hysterectomy    . Breast surgery    . Vaginal sling      REVIEW OF SYSTEMS:  Constitutional: positive for fatigue Eyes: negative Ears, nose, mouth, throat, and face: negative Respiratory: negative Cardiovascular: negative Gastrointestinal: negative Genitourinary:positive for decreased stream and Urinary retention Integument/breast: negative Hematologic/lymphatic: negative Musculoskeletal:positive for back pain Neurological: negative Behavioral/Psych: negative Endocrine: negative Allergic/Immunologic: negative   PHYSICAL EXAMINATION: General appearance: alert, cooperative and no distress Head: Normocephalic, without obvious abnormality, atraumatic Neck: no adenopathy, no JVD, supple, symmetrical, trachea midline and  thyroid not enlarged, symmetric, no tenderness/mass/nodules Lymph nodes: Cervical, supraclavicular, and axillary nodes normal. Resp: clear to auscultation bilaterally Back: symmetric, no curvature. ROM normal. No CVA tenderness. Cardio: regular rate and rhythm, S1, S2 normal, no murmur, click, rub or gallop GI: soft, non-tender; bowel sounds normal; no masses,  no organomegaly Extremities: extremities normal, atraumatic, no cyanosis or edema Neurologic: Alert and oriented X 3, normal strength and tone. Normal symmetric reflexes. Normal coordination and gait  ECOG PERFORMANCE STATUS: 1 - Symptomatic but completely ambulatory  There were no vitals taken for this visit.  LABORATORY DATA: Lab Results  Component Value Date   WBC 6.6 12/25/2013   HGB 8.2* 12/25/2013   HCT 24.9* 12/25/2013   MCV 99.0 12/25/2013   PLT 220 12/25/2013      Chemistry      Component Value Date/Time   NA 137 12/25/2013 1034   NA 138 11/25/2013 1040   NA 139 09/02/2011 1406   K 5.0 12/25/2013 1034   K 4.1 11/25/2013 1040   K 3.9 09/02/2011 1406   CL 104 11/25/2013 1040   CL 105 10/02/2012 1056   CL 97* 09/02/2011 1406  CO2 23 12/25/2013 1034   CO2 21 11/25/2013 1040   CO2 29 09/02/2011 1406   BUN 15.4 12/25/2013 1034   BUN 33* 11/25/2013 1040   BUN 18 09/02/2011 1406   CREATININE 1.3* 12/25/2013 1034   CREATININE 1.60* 11/25/2013 1040   CREATININE 1.2 09/02/2011 1406      Component Value Date/Time   CALCIUM 7.1* 12/25/2013 1034   CALCIUM 7.9* 11/25/2013 1040   CALCIUM 8.0 09/02/2011 1406   ALKPHOS 70 12/25/2013 1034   ALKPHOS 62 11/25/2013 1040   ALKPHOS 71 09/02/2011 1406   AST 18 12/25/2013 1034   AST 19 11/25/2013 1040   AST 25 09/02/2011 1406   ALT 14 12/25/2013 1034   ALT 10 11/25/2013 1040   ALT 20 09/02/2011 1406   BILITOT 0.28 12/25/2013 1034   BILITOT 0.6 11/25/2013 1040   BILITOT 0.60 09/02/2011 1406       RADIOGRAPHIC STUDIES:  ASSESSMENT AND PLAN:  This is a very pleasant 80 years old white female with metastatic non-small cell  lung cancer diagnosed in March 2006 and has been on treatment with several chemotherapy regimen including almost 5 years of Tarceva 150 mg by mouth daily and she is currently on Tarceva 100 mg by mouth daily and tolerating it fairly well with no significant skin rash or diarrhea. She status post 12 months of treatment. Her recent molecular studies showed the presence of resistant EGFR mutation. I had a lengthy discussion with the patient and her niece today about her current disease status and treatment options. I would refer the patient to Dr. Aniceto Boss at [facility] for consideration of enrollment in a clinical trial with the third-generation EGFR Tyrosine Kinase inhibitor.  I recommended for her to continue her current treatment with Tarceva 100 mg by mouth daily until she sees Dr. Aniceto Boss. biomarkers. She was given a refill for Tarceva as well as OxyContin today. She would come back for followup visit in one month for reevaluation and management any adverse effect of her treatment. She was advised to call immediately if she has any concerning symptoms in the interval  The patient voices understanding of current disease status and treatment options and is in agreement with the current care plan.  All questions were answered. The patient knows to call the clinic with any problems, questions or concerns. We can certainly see the patient much sooner if necessary.  Disclaimer: This note was dictated with voice recognition software. Similar sounding words can inadvertently be transcribed and may not be corrected upon review.

## 2014-01-22 NOTE — Telephone Encounter (Signed)
gv and printed appt sched and avs for pt for Sept..Marland KitchenSne msg to [employer]. to send record to [facility]

## 2014-01-23 ENCOUNTER — Telehealth (HOSPITAL_BASED_OUTPATIENT_CLINIC_OR_DEPARTMENT_OTHER): Payer: Self-pay | Admitting: Emergency Medicine

## 2014-01-23 ENCOUNTER — Telehealth: Payer: Self-pay | Admitting: Internal Medicine

## 2014-01-23 NOTE — Telephone Encounter (Signed)
Faxed pt medical records to Dr. Vertell Limber office. Waiting for appt.

## 2014-01-23 NOTE — Telephone Encounter (Signed)
Post ED Visit - Positive Culture Follow-up  Culture report reviewed by antimicrobial stewardship pharmacist: []  Wes Dulaney, Pharm.D., BCPS []  Heide Guile, Pharm.D., BCPS []  Alycia Rossetti, Pharm.D., BCPS []  Section, Pharm.D., BCPS, AAHIVP []  Legrand Como, Pharm.D., BCPS, AAHIVP []  Hassie Bruce, Pharm.D. [x]  Milus Glazier, Pharm.D.  Positive urine culture >100,000 cultures Proteus Mirabilis Treated with Cephalexin 500 mg po bid x 7 days, organism sensitive to the same and no further patient follow-up is required at this time.  Hazle Nordmann 01/23/2014, 4:17 PM

## 2014-01-24 ENCOUNTER — Other Ambulatory Visit: Payer: Medicare Other

## 2014-01-24 ENCOUNTER — Telehealth: Payer: Self-pay | Admitting: Medical Oncology

## 2014-01-24 ENCOUNTER — Ambulatory Visit: Payer: Medicare Other

## 2014-01-24 NOTE — Telephone Encounter (Signed)
appt made w Aniceto Boss [facility]- for august 27th -at 1350. Lennette Bihari [facility] scheduler )  will call ptt.

## 2014-02-02 ENCOUNTER — Emergency Department (HOSPITAL_COMMUNITY)
Admission: EM | Admit: 2014-02-02 | Discharge: 2014-02-02 | Disposition: A | Discharge status: Home / Self Care | Payer: Medicare Other | Attending: Emergency Medicine | Admitting: Emergency Medicine

## 2014-02-02 ENCOUNTER — Encounter (HOSPITAL_COMMUNITY): Payer: Self-pay | Admitting: Emergency Medicine

## 2014-02-02 DIAGNOSIS — I10 Essential (primary) hypertension: Secondary | ICD-10-CM | POA: Diagnosis not present

## 2014-02-02 DIAGNOSIS — Z90710 Acquired absence of both cervix and uterus: Secondary | ICD-10-CM | POA: Diagnosis not present

## 2014-02-02 DIAGNOSIS — R109 Unspecified abdominal pain: Secondary | ICD-10-CM | POA: Insufficient documentation

## 2014-02-02 DIAGNOSIS — Z9089 Acquired absence of other organs: Secondary | ICD-10-CM | POA: Insufficient documentation

## 2014-02-02 DIAGNOSIS — Z872 Personal history of diseases of the skin and subcutaneous tissue: Secondary | ICD-10-CM | POA: Insufficient documentation

## 2014-02-02 DIAGNOSIS — Z9889 Other specified postprocedural states: Secondary | ICD-10-CM | POA: Insufficient documentation

## 2014-02-02 DIAGNOSIS — Z792 Long term (current) use of antibiotics: Secondary | ICD-10-CM | POA: Insufficient documentation

## 2014-02-02 DIAGNOSIS — R339 Retention of urine, unspecified: Secondary | ICD-10-CM | POA: Insufficient documentation

## 2014-02-02 DIAGNOSIS — Z79899 Other long term (current) drug therapy: Secondary | ICD-10-CM | POA: Insufficient documentation

## 2014-02-02 DIAGNOSIS — R338 Other retention of urine: Secondary | ICD-10-CM

## 2014-02-02 DIAGNOSIS — Z791 Long term (current) use of non-steroidal anti-inflammatories (NSAID): Secondary | ICD-10-CM | POA: Diagnosis not present

## 2014-02-02 DIAGNOSIS — Z85118 Personal history of other malignant neoplasm of bronchus and lung: Secondary | ICD-10-CM | POA: Insufficient documentation

## 2014-02-02 LAB — COMPREHENSIVE METABOLIC PANEL
ALT: 9 U/L (ref 0–35)
ANION GAP: 12 (ref 5–15)
AST: 22 U/L (ref 0–37)
Albumin: 3.3 g/dL — ABNORMAL LOW (ref 3.5–5.2)
Alkaline Phosphatase: 85 U/L (ref 39–117)
BILIRUBIN TOTAL: 0.7 mg/dL (ref 0.3–1.2)
BUN: 43 mg/dL — AB (ref 6–23)
CHLORIDE: 102 meq/L (ref 96–112)
CO2: 22 meq/L (ref 19–32)
CREATININE: 1.7 mg/dL — AB (ref 0.50–1.10)
Calcium: 7.7 mg/dL — ABNORMAL LOW (ref 8.4–10.5)
GFR, EST AFRICAN AMERICAN: 31 mL/min — AB (ref >90)
GFR, EST NON AFRICAN AMERICAN: 26 mL/min — AB (ref >90)
Glucose, Bld: 120 mg/dL — ABNORMAL HIGH (ref 70–99)
Potassium: 5.2 mEq/L (ref 3.7–5.3)
Sodium: 136 mEq/L — ABNORMAL LOW (ref 137–147)
Total Protein: 7.1 g/dL (ref 6.0–8.3)

## 2014-02-02 LAB — URINALYSIS, ROUTINE W REFLEX MICROSCOPIC
Bilirubin Urine: NEGATIVE
Glucose, UA: NEGATIVE mg/dL
KETONES UR: NEGATIVE mg/dL
NITRITE: NEGATIVE
PROTEIN: 100 mg/dL — AB
Specific Gravity, Urine: 1.011 (ref 1.005–1.030)
UROBILINOGEN UA: 0.2 mg/dL (ref 0.0–1.0)
pH: 8.5 — ABNORMAL HIGH (ref 5.0–8.0)

## 2014-02-02 LAB — CBC WITH DIFFERENTIAL/PLATELET
BASOS ABS: 0 10*3/uL (ref 0.0–0.1)
Basophils Relative: 0 % (ref 0–1)
Eosinophils Absolute: 0.1 10*3/uL (ref 0.0–0.7)
Eosinophils Relative: 1 % (ref 0–5)
HCT: 29.4 % — ABNORMAL LOW (ref 36.0–46.0)
Hemoglobin: 9.6 g/dL — ABNORMAL LOW (ref 12.0–15.0)
LYMPHS PCT: 5 % — AB (ref 12–46)
Lymphs Abs: 0.5 10*3/uL — ABNORMAL LOW (ref 0.7–4.0)
MCH: 32.1 pg (ref 26.0–34.0)
MCHC: 32.7 g/dL (ref 30.0–36.0)
MCV: 98.3 fL (ref 78.0–100.0)
MONO ABS: 0.6 10*3/uL (ref 0.1–1.0)
Monocytes Relative: 6 % (ref 3–12)
Neutro Abs: 9.1 10*3/uL — ABNORMAL HIGH (ref 1.7–7.7)
Neutrophils Relative %: 88 % — ABNORMAL HIGH (ref 43–77)
Platelets: 212 10*3/uL (ref 150–400)
RBC: 2.99 MIL/uL — ABNORMAL LOW (ref 3.87–5.11)
RDW: 13.9 % (ref 11.5–15.5)
WBC: 10.4 10*3/uL (ref 4.0–10.5)

## 2014-02-02 LAB — URINE MICROSCOPIC-ADD ON

## 2014-02-02 NOTE — ED Notes (Signed)
Pt c/o lower abd pain that started a couple of hours ago.  Pt has indwelling cath from couple of weeks ago and pt's visitor has noticed no output in the bag this morning.  Pt was seen here couple weeks ago for catheter being clogged and not for sure if this is the same problem again.

## 2014-02-02 NOTE — ED Provider Notes (Signed)
CSN: [id number]     Arrival date & time 02/02/14  0720 History   First MD Initiated Contact with Patient 02/02/14 [phone]     Chief Complaint  Patient presents with  . Abdominal Pain     (Consider location/radiation/quality/duration/timing/severity/associated sxs/prior Treatment) Patient is a 79 y.o. female presenting with abdominal pain. The history is provided by the patient and a relative.  Abdominal Pain  She is here for nonfunctioning Foley catheter. She developed a, pain, last evening. It is worsening today. She's not had any urinary output. She is being managed by urology for persistent difficulty voiding and required Foley catheterization. She was in the ED about 2 weeks ago, at that time. Her Foley catheter was changed, and she was started on antibiotic for an abnormal urinalysis. Her urologist on this antibiotic after 4 or 5 days. She denies recent fever, chills, nausea, vomiting, weakness, or dizziness. She has been eating and drinking well. She is scheduled for her urinary, and rectal testing to evaluate her ability to void. This is scheduled for 2 days from now. She's taking her usual medications. There is no other known modifying factors  Past Medical History  Diagnosis Date  . Cancer   . Lung cancer   . Hypertension   . Cellulitis of left lower extremity 07/16/2012   Past Surgical History  Procedure Laterality Date  . Cholecystectomy    . Abdominal hysterectomy    . Breast surgery    . Vaginal sling     Family History  Problem Relation Age of Onset  . Cancer Mother   . Heart attack Father   . Cancer Brother   . Cancer Brother   . Cancer Brother    History  Substance Use Topics  . Smoking status: Never Smoker   . Smokeless tobacco: Never Used  . Alcohol Use: No   OB History   Grav Para Term Preterm Abortions TAB SAB Ect Mult Living                 Review of Systems  Gastrointestinal: Positive for abdominal pain.  All other systems reviewed and are  negative.     Allergies  Codeine  Home Medications   Prior to Admission medications   Medication Sig Start Date End Date Taking? Authorizing Provider  bisacodyl (DULCOLAX) 5 MG EC tablet Take 5 mg by mouth daily as needed for constipation.    Yes Historical Provider, MD  denosumab (XGEVA) 120 MG/1.7ML SOLN injection Inject 120 mg into the skin once.   Yes Historical Provider, MD  docusate sodium (COLACE) 100 MG capsule Take 100 mg by mouth 2 (two) times daily as needed for constipation.    Yes Historical Provider, MD  erlotinib (TARCEVA) 100 MG tablet Take 1 tablet (100 mg total) by mouth daily. Take on an empty stomach 1 hour before meals or 2 hours after 01/22/14  Yes Curt Bears, MD  gabapentin (NEURONTIN) 100 MG capsule Take 200 mg by mouth 3 (three) times daily.    Yes Historical Provider, MD  magnesium hydroxide (MILK OF MAGNESIA) 400 MG/5ML suspension Take 30 mLs by mouth daily as needed for mild constipation.   Yes Historical Provider, MD  naproxen sodium (ANAPROX) 220 MG tablet Take 220 mg by mouth 2 (two) times daily with a meal.   Yes Historical Provider, MD  olmesartan (BENICAR) 20 MG tablet Take 20 mg by mouth daily.    Yes Historical Provider, MD  OxyCODONE (OXYCONTIN) 20 mg T12A 12 hr tablet  Take 2 tablets (40 mg total) by mouth at bedtime. 01/22/14  Yes Curt Bears, MD  polyethylene glycol (MIRALAX / GLYCOLAX) packet Take 17 g by mouth daily with breakfast.   Yes Historical Provider, MD  zolendronic acid (ZOMETA) 4 MG/5ML injection Inject 4 mg into the vein every 3 (three) months.    Yes Historical Provider, MD  cephALEXin (KEFLEX) 500 MG capsule Take 500 mg by mouth 3 (three) times daily.  01/19/14   Historical Provider, MD   BP 120/51  Pulse 78  Temp(Src) 98.4 F (36.9 C) (Oral)  Resp 16  SpO2 100% Physical Exam  Nursing note and vitals reviewed. Constitutional: She is oriented to person, place, and time. She appears well-developed and well-nourished.  HENT:   Head: Normocephalic and atraumatic.  Eyes: Conjunctivae and EOM are normal. Pupils are equal, round, and reactive to light.  Neck: Normal range of motion and phonation normal. Neck supple.  Cardiovascular: Normal rate, regular rhythm and intact distal pulses.   Pulmonary/Chest: Effort normal and breath sounds normal. She exhibits no tenderness.  Abdominal: Soft. She exhibits mass (Suprapubic mass consistent with enlarged bladder). She exhibits no distension. There is tenderness (ssuprapubic, moderate). There is no guarding.  Musculoskeletal: Normal range of motion.  Neurological: She is alert and oriented to person, place, and time. She exhibits normal muscle tone.  Skin: Skin is warm and dry.  Psychiatric: She has a normal mood and affect. Her behavior is normal. Judgment and thought content normal.    ED Course  Procedures (including critical care time) Bladder scan indicated urinary retention  Foley catheter irrigation was attempted, however, no fluid could be instilled secondary to pain  Foley catheter changed, by nursing with larger, inserted, 18-gauge.  Medications - No data to display  Patient Vitals for the past 24 hrs:  BP Temp Temp src Pulse Resp SpO2  02/02/14 0951 120/51 mmHg - - 78 16 100 %  02/02/14 0729 160/63 mmHg 98.4 F (36.9 C) Oral 95 19 100 %    9:58 AM Reevaluation with update and discussion. After initial assessment and treatment, an updated evaluation reveals  she feels better. Findings discussed with patient, and daughter. She will be sent home with leg bag, and the larger back, use at night, which may help urinary drainage. Richarda Blade   Exam after Foley catheterization, abdomen, soft, nontender, and there is no suprapubic mass   Labs Review Labs Reviewed  CBC WITH DIFFERENTIAL - Abnormal; Notable for the following:    RBC 2.99 (*)    Hemoglobin 9.6 (*)    HCT 29.4 (*)    Neutrophils Relative % 88 (*)    Neutro Abs 9.1 (*)    Lymphocytes  Relative 5 (*)    Lymphs Abs 0.5 (*)    All other components within normal limits  COMPREHENSIVE METABOLIC PANEL - Abnormal; Notable for the following:    Sodium 136 (*)    Glucose, Bld 120 (*)    BUN 43 (*)    Creatinine, Ser 1.70 (*)    Calcium 7.7 (*)    Albumin 3.3 (*)    GFR calc non Af Amer 26 (*)    GFR calc Af Amer 31 (*)    All other components within normal limits  URINALYSIS, ROUTINE W REFLEX MICROSCOPIC - Abnormal; Notable for the following:    APPearance CLOUDY (*)    pH 8.5 (*)    Hgb urine dipstick SMALL (*)    Protein, ur 100 (*)  Leukocytes, UA LARGE (*)    All other components within normal limits  URINE MICROSCOPIC-ADD ON - Abnormal; Notable for the following:    Bacteria, UA MANY (*)    All other components within normal limits  URINE CULTURE    Imaging Review No results found.   EKG Interpretation None      MDM   Final diagnoses:  Acute urinary retention    Urinary Retention, recurrent. Urinalysis, is abnormal; however, she does not have systemic signs of illness. The urine is cultured and she will be maintained, off antibiotics, for now. She has short-term followup scheduled with urology, in 2 days.  Nursing Notes Reviewed/ Care Coordinated Applicable Imaging Reviewed Interpretation of Laboratory Data incorporated into ED treatment  The patient appears reasonably screened and/or stabilized for discharge and I doubt any other medical condition or other [facility] requiring further screening, evaluation, or treatment in the ED at this time prior to discharge.  Plan: Home Medications- usual; Home Treatments- foley care at home; return here if the recommended treatment, does not improve the symptoms; Recommended follow up- Urology in 2 days as scheduled.     Richarda Blade, MD 02/02/14 1001

## 2014-02-02 NOTE — Discharge Instructions (Signed)
Acute Urinary Retention Acute urinary retention is the temporary inability to urinate. This is an uncommon problem in women. It can be caused by:  Infection.  A side effect of a medicine.  A problem in a nearby organ that presses or squeezes on the bladder or the urethra (the tube that drains the bladder).  Psychological problems.   Surgery on your bladder, urethra, or pelvic organs that causes obstruction to the outflow of urine from your bladder. HOME CARE INSTRUCTIONS  If you are sent home with a Foley catheter and a drainage system, you will need to discuss the best course of action with your health care provider. While the catheter is in, maintain a good intake of fluids. Keep the drainage bag emptied and lower than your catheter. This is so that contaminated urine will not flow back into your bladder, which could lead to a urinary tract infection. There are two main types of drainage bags. One is a large bag that usually is used at night. It has a good capacity that will allow you to sleep through the night without having to empty it. The second type is called a leg bag. It has a smaller capacity so it needs to be emptied more frequently. However, the main advantage is that it can be attached by a leg strap and goes underneath your clothing, allowing you the freedom to move about or leave your home. Only take over-the-counter or prescription medicines for pain, discomfort, or fever as directed by your health care provider.  SEEK MEDICAL CARE IF:  You develop a low-grade fever.  You experience spasms or leakage of urine with the spasms. SEEK IMMEDIATE MEDICAL CARE IF:   You develop chills or fever.  Your catheter stops draining urine.  Your catheter falls out.  You start to develop increased bleeding that does not respond to rest and increased fluid intake. MAKE SURE YOU:  Understand these instructions.  Will watch your condition.  Will get help right away if you are not  doing well or get worse. Document Released: 06/05/2006 Document Revised: 03/27/2013 Document Reviewed: 11/15/2012 [facility] Patient Information 2015 [place], [place]. This information is not intended to replace advice given to you by your health care provider. Make sure you discuss any questions you have with your health care provider.

## 2014-02-04 LAB — URINE CULTURE
Colony Count: >=100000
SPECIAL REQUESTS: NORMAL

## 2014-02-05 NOTE — ED Notes (Signed)
Reviewed by Milus Glazier, RPH. (+) urine culture, currently treated with Cefazolin

## 2014-02-07 ENCOUNTER — Telehealth: Payer: Self-pay | Admitting: *Deleted

## 2014-02-07 NOTE — Telephone Encounter (Signed)
Office note from [facility] urology given to Dr Vista Mink to review.  SLJ

## 2014-02-10 ENCOUNTER — Emergency Department (HOSPITAL_COMMUNITY)
Admission: EM | Admit: 2014-02-10 | Discharge: 2014-02-11 | Disposition: A | Discharge status: Home / Self Care | Payer: Medicare Other | Attending: Emergency Medicine | Admitting: Emergency Medicine

## 2014-02-10 ENCOUNTER — Encounter (HOSPITAL_COMMUNITY): Payer: Self-pay | Admitting: Emergency Medicine

## 2014-02-10 DIAGNOSIS — I10 Essential (primary) hypertension: Secondary | ICD-10-CM | POA: Insufficient documentation

## 2014-02-10 DIAGNOSIS — Y846 Urinary catheterization as the cause of abnormal reaction of the patient, or of later complication, without mention of misadventure at the time of the procedure: Secondary | ICD-10-CM | POA: Diagnosis not present

## 2014-02-10 DIAGNOSIS — N390 Urinary tract infection, site not specified: Secondary | ICD-10-CM | POA: Insufficient documentation

## 2014-02-10 DIAGNOSIS — T83091A Other mechanical complication of indwelling urethral catheter, initial encounter: Secondary | ICD-10-CM | POA: Insufficient documentation

## 2014-02-10 DIAGNOSIS — Z79899 Other long term (current) drug therapy: Secondary | ICD-10-CM | POA: Insufficient documentation

## 2014-02-10 DIAGNOSIS — Z85118 Personal history of other malignant neoplasm of bronchus and lung: Secondary | ICD-10-CM | POA: Diagnosis not present

## 2014-02-10 DIAGNOSIS — T839XXA Unspecified complication of genitourinary prosthetic device, implant and graft, initial encounter: Secondary | ICD-10-CM

## 2014-02-10 NOTE — ED Notes (Signed)
Pt states she has a urinary catheter in place and tonight it is not draining  Family states they tried to flush it but were unable to

## 2014-02-11 LAB — URINALYSIS, ROUTINE W REFLEX MICROSCOPIC
Bilirubin Urine: NEGATIVE
Glucose, UA: NEGATIVE mg/dL
KETONES UR: NEGATIVE mg/dL
NITRITE: NEGATIVE
PROTEIN: 100 mg/dL — AB
Specific Gravity, Urine: 1.013 (ref 1.005–1.030)
Urobilinogen, UA: 0.2 mg/dL (ref 0.0–1.0)
pH: 8.5 — ABNORMAL HIGH (ref 5.0–8.0)

## 2014-02-11 LAB — URINE MICROSCOPIC-ADD ON

## 2014-02-11 MED ORDER — CEFIXIME 400 MG PO TABS: 400.0000 mg | ORAL_TABLET | Freq: Every day | ORAL | Status: DC

## 2014-02-11 NOTE — Discharge Instructions (Signed)
Please followup with your urology specialist for continued evaluation and treatment. Take the antibiotics as prescribed for the full length of time.    Urinary Tract Infection Urinary tract infections (UTIs) can develop anywhere along your urinary tract. Your urinary tract is your body's drainage system for removing wastes and extra water. Your urinary tract includes two kidneys, two ureters, a bladder, and a urethra. Your kidneys are a pair of bean-shaped organs. Each kidney is about the size of your fist. They are located below your ribs, one on each side of your spine. CAUSES Infections are caused by microbes, which are microscopic organisms, including fungi, viruses, and bacteria. These organisms are so small that they can only be seen through a microscope. Bacteria are the microbes that most commonly cause UTIs. SYMPTOMS  Symptoms of UTIs may vary by age and gender of the patient and by the location of the infection. Symptoms in young women typically include a frequent and intense urge to urinate and a painful, burning feeling in the bladder or urethra during urination. Older women and men are more likely to be tired, shaky, and weak and have muscle aches and abdominal pain. A fever may mean the infection is in your kidneys. Other symptoms of a kidney infection include pain in your back or sides below the ribs, nausea, and vomiting. DIAGNOSIS To diagnose a UTI, your caregiver will ask you about your symptoms. Your caregiver also will ask to provide a urine sample. The urine sample will be tested for bacteria and white blood cells. White blood cells are made by your body to help fight infection. TREATMENT  Typically, UTIs can be treated with medication. Because most UTIs are caused by a bacterial infection, they usually can be treated with the use of antibiotics. The choice of antibiotic and length of treatment depend on your symptoms and the type of bacteria causing your infection. HOME CARE  INSTRUCTIONS  If you were prescribed antibiotics, take them exactly as your caregiver instructs you. Finish the medication even if you feel better after you have only taken some of the medication.  Drink enough water and fluids to keep your urine clear or pale yellow.  Avoid caffeine, tea, and carbonated beverages. They tend to irritate your bladder.  Empty your bladder often. Avoid holding urine for long periods of time.  Empty your bladder before and after sexual intercourse.  After a bowel movement, women should cleanse from front to back. Use each tissue only once. SEEK MEDICAL CARE IF:   You have back pain.  You develop a fever.  Your symptoms do not begin to resolve within 3 days. SEEK IMMEDIATE MEDICAL CARE IF:   You have severe back pain or lower abdominal pain.  You develop chills.  You have nausea or vomiting.  You have continued burning or discomfort with urination. MAKE SURE YOU:   Understand these instructions.  Will watch your condition.  Will get help right away if you are not doing well or get worse. Document Released: 03/16/2005 Document Revised: 12/06/2011 Document Reviewed: 07/15/2011 [facility] Patient Information 2015 [place], [place]. This information is not intended to replace advice given to you by your health care provider. Make sure you discuss any questions you have with your health care provider.

## 2014-02-11 NOTE — ED Provider Notes (Signed)
CSN: [id number]     Arrival date & time 02/10/14  2149 History   First MD Initiated Contact with Patient 02/10/14 2329     Chief Complaint  Patient presents with  . catheter not draining    HPI  History provided by the patient and family. Patient is 80 year-old female with history of hypertension, lung cancer, and urinary retention with Foley catheter presenting with concerns for blocked Foley catheter. Patient began to notice decreased urinary output and her Foley catheter bag earlier today to the point where she was no longer having any significant drainage tonight. She was also having increased pressure and pain to the lower abdomen. Family also reports patient recently having issues with constipation. She has been taking one dose of MiraLax in the morning and a dose of Colace at night butwas seen by GI specialist today who has instructed them to increase her dose today. Patient is followed for her urinary symptoms by Dr. Risa Grill. There has been no associated fever, chills or sweats. No increased confusion.no hematuria. No back or flank pain.    Past Medical History  Diagnosis Date  . Cancer   . Lung cancer   . Hypertension   . Cellulitis of left lower extremity 07/16/2012   Past Surgical History  Procedure Laterality Date  . Cholecystectomy    . Abdominal hysterectomy    . Breast surgery    . Vaginal sling     Family History  Problem Relation Age of Onset  . Cancer Mother   . Heart attack Father   . Cancer Brother   . Cancer Brother   . Cancer Brother    History  Substance Use Topics  . Smoking status: Never Smoker   . Smokeless tobacco: Never Used  . Alcohol Use: No   OB History   Grav Para Term Preterm Abortions TAB SAB Ect Mult Living                 Review of Systems  Constitutional: Negative for fever, chills and diaphoresis.  Gastrointestinal: Positive for abdominal pain and constipation. Negative for nausea, vomiting and diarrhea.  Genitourinary: Positive for  decreased urine volume.  All other systems reviewed and are negative.     Allergies  Codeine  Home Medications   Prior to Admission medications   Medication Sig Start Date End Date Taking? Authorizing Provider  docusate sodium (COLACE) 100 MG capsule Take 100 mg by mouth 2 (two) times daily as needed for constipation.    Yes Historical Provider, MD  erlotinib (TARCEVA) 100 MG tablet Take 1 tablet (100 mg total) by mouth daily. Take on an empty stomach 1 hour before meals or 2 hours after 01/22/14  Yes Curt Bears, MD  gabapentin (NEURONTIN) 100 MG capsule Take 200 mg by mouth 3 (three) times daily.    Yes Historical Provider, MD  gentamicin (GARAMYCIN) 0.3 % ophthalmic solution Place 1 drop into the left eye 4 (four) times daily. Patient began on 02/08/14 until bottle completed.   Yes Historical Provider, MD  naproxen sodium (ANAPROX) 220 MG tablet Take 220 mg by mouth 2 (two) times daily as needed (for pain.).    Yes Historical Provider, MD  olmesartan (BENICAR) 20 MG tablet Take 20 mg by mouth daily.    Yes Historical Provider, MD  OxyCODONE (OXYCONTIN) 20 mg T12A 12 hr tablet Take 20 mg by mouth every 12 (twelve) hours.   Yes Historical Provider, MD  polyethylene glycol (MIRALAX / GLYCOLAX) packet Take 17  g by mouth daily with breakfast.   Yes Historical Provider, MD  zolendronic acid (ZOMETA) 4 MG/5ML injection Inject 4 mg into the vein every 3 (three) months.    Yes Historical Provider, MD   BP 127/74  Pulse 93  Temp(Src) 98.3 F (36.8 C) (Oral)  Resp 18  SpO2 98% Physical Exam  Nursing note and vitals reviewed. Constitutional: She is oriented to person, place, and time. She appears well-developed and well-nourished. No distress.  HENT:  Head: Normocephalic.  Cardiovascular: Normal rate and regular rhythm.   Pulmonary/Chest: Effort normal and breath sounds normal. No respiratory distress.  Abdominal: Soft. There is tenderness in the suprapubic area. There is no rebound, no  guarding and no CVA tenderness.  Musculoskeletal: Normal range of motion.  Neurological: She is alert and oriented to person, place, and time.  Skin: Skin is warm and dry. No rash noted.  Psychiatric: She has a normal mood and affect. Her behavior is normal.    ED Course  Procedures   COORDINATION OF CARE:  Nursing notes reviewed. Vital signs reviewed. Initial pt interview and examination performed.   Filed Vitals:   02/10/14 2231  BP: 127/74  Pulse: 93  Temp: 98.3 F (36.8 C)  TempSrc: Oral  Resp: 18  SpO2: 98%    12:01 AM-patient seen and evaluated. Patient well-appearing no acute distress. Does not appear in severe pain or discomfort. Bladder scan was over 700 mL. No significant output through Foley catheter.   Treatment plan initiated:Medications - No data to display  Results for orders placed during the hospital encounter of 02/10/14  URINALYSIS, ROUTINE W REFLEX MICROSCOPIC      Result Value Ref Range   Color, Urine YELLOW  YELLOW   APPearance TURBID (*) CLEAR   Specific Gravity, Urine 1.013  1.005 - 1.030   pH 8.5 (*) 5.0 - 8.0   Glucose, UA NEGATIVE  NEGATIVE mg/dL   Hgb urine dipstick SMALL (*) NEGATIVE   Bilirubin Urine NEGATIVE  NEGATIVE   Ketones, ur NEGATIVE  NEGATIVE mg/dL   Protein, ur 100 (*) NEGATIVE mg/dL   Urobilinogen, UA 0.2  0.0 - 1.0 mg/dL   Nitrite NEGATIVE  NEGATIVE   Leukocytes, UA LARGE (*) NEGATIVE  URINE MICROSCOPIC-ADD ON      Result Value Ref Range   Squamous Epithelial / LPF RARE  RARE   WBC, UA TOO NUMEROUS TO COUNT  <3 WBC/hpf   RBC / HPF 7-10  <3 RBC/hpf   Bacteria, UA MANY (*) RARE   Casts HYALINE CASTS (*) NEGATIVE   Crystals TRIPLE PHOSPHATE CRYSTALS (*) NEGATIVE      MDM   Final diagnoses:  UTI (lower urinary tract infection)  Foley catheter problem, initial encounter        Martie Lee, PA-C 02/11/14 [phone]

## 2014-02-13 LAB — URINE CULTURE: Colony Count: 45000

## 2014-02-13 NOTE — ED Provider Notes (Signed)
Medical screening examination/treatment/procedure(s) were performed by non-physician practitioner and as supervising physician I was immediately available for consultation/collaboration.   EKG Interpretation None        Mariea Clonts, MD 02/13/14 [phone]

## 2014-02-16 ENCOUNTER — Encounter (HOSPITAL_COMMUNITY): Payer: Self-pay | Admitting: Emergency Medicine

## 2014-02-16 ENCOUNTER — Emergency Department (HOSPITAL_COMMUNITY)
Admission: EM | Admit: 2014-02-16 | Discharge: 2014-02-16 | Disposition: A | Discharge status: Home / Self Care | Payer: Medicare Other | Attending: Emergency Medicine | Admitting: Emergency Medicine

## 2014-02-16 DIAGNOSIS — Z792 Long term (current) use of antibiotics: Secondary | ICD-10-CM | POA: Insufficient documentation

## 2014-02-16 DIAGNOSIS — T83511A Infection and inflammatory reaction due to indwelling urethral catheter, initial encounter: Secondary | ICD-10-CM | POA: Diagnosis not present

## 2014-02-16 DIAGNOSIS — N3090 Cystitis, unspecified without hematuria: Secondary | ICD-10-CM | POA: Diagnosis not present

## 2014-02-16 DIAGNOSIS — I10 Essential (primary) hypertension: Secondary | ICD-10-CM | POA: Insufficient documentation

## 2014-02-16 DIAGNOSIS — Z872 Personal history of diseases of the skin and subcutaneous tissue: Secondary | ICD-10-CM | POA: Diagnosis not present

## 2014-02-16 DIAGNOSIS — Z79899 Other long term (current) drug therapy: Secondary | ICD-10-CM | POA: Diagnosis not present

## 2014-02-16 DIAGNOSIS — T83518A Infection and inflammatory reaction due to other urinary catheter, initial encounter: Secondary | ICD-10-CM

## 2014-02-16 DIAGNOSIS — N390 Urinary tract infection, site not specified: Secondary | ICD-10-CM

## 2014-02-16 DIAGNOSIS — Y846 Urinary catheterization as the cause of abnormal reaction of the patient, or of later complication, without mention of misadventure at the time of the procedure: Secondary | ICD-10-CM | POA: Insufficient documentation

## 2014-02-16 DIAGNOSIS — Z791 Long term (current) use of non-steroidal anti-inflammatories (NSAID): Secondary | ICD-10-CM | POA: Insufficient documentation

## 2014-02-16 DIAGNOSIS — Z85118 Personal history of other malignant neoplasm of bronchus and lung: Secondary | ICD-10-CM | POA: Diagnosis not present

## 2014-02-16 LAB — URINALYSIS, ROUTINE W REFLEX MICROSCOPIC
BILIRUBIN URINE: NEGATIVE
GLUCOSE, UA: NEGATIVE mg/dL
Ketones, ur: NEGATIVE mg/dL
Nitrite: POSITIVE — AB
Protein, ur: NEGATIVE mg/dL
SPECIFIC GRAVITY, URINE: 1.019 (ref 1.005–1.030)
Urobilinogen, UA: 1 mg/dL (ref 0.0–1.0)
pH: 7.5 (ref 5.0–8.0)

## 2014-02-16 LAB — URINE MICROSCOPIC-ADD ON

## 2014-02-16 MED ORDER — CEPHALEXIN 500 MG PO CAPS
500.0000 mg | ORAL_CAPSULE | Freq: Three times a day (TID) | ORAL | Status: DC
Start: 2014-02-16 — End: 2014-03-03

## 2014-02-16 MED ORDER — CEPHALEXIN 500 MG PO CAPS: 500.0000 mg | ORAL_CAPSULE | Freq: Three times a day (TID) | ORAL | Status: DC

## 2014-02-16 MED ORDER — CEFTRIAXONE SODIUM 1 G IJ SOLR
1.0000 g | Freq: Once | INTRAMUSCULAR | Status: AC
  Administered 2014-02-16: 1 g via INTRAMUSCULAR
  Filled 2014-02-16: qty 10

## 2014-02-16 MED ORDER — STERILE WATER FOR INJECTION IJ SOLN
INTRAMUSCULAR | Status: AC
  Administered 2014-02-16: 10 mL
  Filled 2014-02-16: qty 10

## 2014-02-16 NOTE — Discharge Instructions (Signed)

## 2014-02-16 NOTE — ED Provider Notes (Signed)
CSN: [id number]     Arrival date & time 02/16/14  1548 History  This chart was scribed for non-physician practitioner, Margarita Mail, PA-C,working with Leota Jacobsen, MD, by Marlowe Kays, ED Scribe. This patient was seen in room WTR8/WTR8 and the patient's care was started at 5:27 PM.  Chief Complaint  Patient presents with  . urinary catheter clogged    The history is provided by the patient. No language interpreter was used.   HPI Comments:  Jamie Munoz is a 80 y.o. female who presents to the Emergency Department needing her urinary catheter changed secondary to a clog that she noticed earlier today. Her family tried to flush the catheter unsuccessfully. She wears a catheter due to neuropathy that disables her from knowing when she has to have bowel or bladder movements. She is currently a lung cancer pt. She denies fever, chills, nausea or vomiting.  Past Medical History  Diagnosis Date  . Cancer   . Lung cancer   . Hypertension   . Cellulitis of left lower extremity 07/16/2012   Past Surgical History  Procedure Laterality Date  . Cholecystectomy    . Abdominal hysterectomy    . Breast surgery    . Vaginal sling     Family History  Problem Relation Age of Onset  . Cancer Mother   . Heart attack Father   . Cancer Brother   . Cancer Brother   . Cancer Brother    History  Substance Use Topics  . Smoking status: Never Smoker   . Smokeless tobacco: Never Used  . Alcohol Use: No   OB History   Grav Para Term Preterm Abortions TAB SAB Ect Mult Living                 Review of Systems Ten systems reviewed and are negative for acute change, except as noted in the HPI.   Allergies  Codeine  Home Medications   Prior to Admission medications   Medication Sig Start Date End Date Taking? Authorizing Provider  cefixime (SUPRAX) 400 MG tablet Take 1 tablet (400 mg total) by mouth daily. X 7 days 02/11/14   Ruthell Rummage Dammen, PA-C  docusate sodium (COLACE) 100 MG capsule  Take 100 mg by mouth 2 (two) times daily as needed for constipation.     Historical Provider, MD  erlotinib (TARCEVA) 100 MG tablet Take 1 tablet (100 mg total) by mouth daily. Take on an empty stomach 1 hour before meals or 2 hours after 01/22/14   Curt Bears, MD  gabapentin (NEURONTIN) 100 MG capsule Take 200 mg by mouth 3 (three) times daily.     Historical Provider, MD  gentamicin (GARAMYCIN) 0.3 % ophthalmic solution Place 1 drop into the left eye 4 (four) times daily. Patient began on 02/08/14 until bottle completed.    Historical Provider, MD  naproxen sodium (ANAPROX) 220 MG tablet Take 220 mg by mouth 2 (two) times daily as needed (for pain.).     Historical Provider, MD  olmesartan (BENICAR) 20 MG tablet Take 20 mg by mouth daily.     Historical Provider, MD  OxyCODONE (OXYCONTIN) 20 mg T12A 12 hr tablet Take 20 mg by mouth every 12 (twelve) hours.    Historical Provider, MD  polyethylene glycol (MIRALAX / GLYCOLAX) packet Take 17 g by mouth daily with breakfast.    Historical Provider, MD  zolendronic acid (ZOMETA) 4 MG/5ML injection Inject 4 mg into the vein every 3 (three) months.  Historical Provider, MD   Triage Vitals: BP 151/51  Pulse 78  Temp(Src) 98 F (36.7 C) (Oral)  Resp 20  SpO2 94% Physical Exam  Nursing note and vitals reviewed. Constitutional: She is oriented to person, place, and time. She appears well-developed and well-nourished.  HENT:  Head: Normocephalic and atraumatic.  Eyes: EOM are normal.  Neck: Normal range of motion.  Cardiovascular: Normal rate, regular rhythm and normal heart sounds.  Exam reveals no gallop and no friction rub.   No murmur heard. Pulmonary/Chest: Effort normal and breath sounds normal. No respiratory distress. She has no wheezes. She has no rales.  Musculoskeletal: Normal range of motion.  Neurological: She is alert and oriented to person, place, and time.  Skin: Skin is warm and dry.  Psychiatric: She has a normal mood and  affect. Her behavior is normal.    ED Course  Procedures (including critical care time) DIAGNOSTIC STUDIES: Oxygen Saturation is 94% on RA, adequate by my interpretation.   COORDINATION OF CARE: 5:32 PM- Will order urinalysis and change catheter. Pt verbalizes understanding and agrees to plan.  Medications - No data to display  Labs Review Labs Reviewed  URINALYSIS, ROUTINE W REFLEX MICROSCOPIC    Imaging Review No results found.   EKG Interpretation None      MDM   Final diagnoses:  UTI (lower urinary tract infection)  Catheter cystitis, initial encounter   Patient with indwelling cathether which was flushed and changed.  Appears to have a UTI. Sent for culture. Will treat with keflex.   Pt has been diagnosed with a UTI. Pt is afebrile, no CVA tenderness, normotensive, and denies N/V. Pt to be dc home with antibiotics and instructions to follow up with PCP if symptoms persist.  I personally reviewed the imaging tests through PACS system. I have reviewed and interpreted Lab values. I reviewed available ER/hospitalization records through the EMR     I personally performed the services described in this documentation, which was scribed in my presence. The recorded information has been reviewed and is accurate.    Margarita Mail, PA-C 02/23/14 2201

## 2014-02-16 NOTE — ED Notes (Signed)
Pt has urinary catheter, put in 02/10/14, family states tried to flush catheter out around 1400 and could not. Pt not in any pain at the moment.

## 2014-02-19 ENCOUNTER — Other Ambulatory Visit: Payer: Medicare Other

## 2014-02-19 ENCOUNTER — Ambulatory Visit: Payer: Medicare Other | Admitting: Internal Medicine

## 2014-02-19 ENCOUNTER — Other Ambulatory Visit: Payer: Self-pay | Admitting: Medical Oncology

## 2014-02-19 ENCOUNTER — Telehealth: Payer: Self-pay | Admitting: Medical Oncology

## 2014-02-19 NOTE — Telephone Encounter (Signed)
Has urinary catheter ( for ?  neurogenic bladder) and she has recurrent UTI's and Dr Risa Grill aware. Home health did assessment and RN will come back tomorrow after pt  talk to [facility]. Sheryl said Meagen cannot operate a microwave anymore and does not know how to set alarm or disarm it. When she walks she has to hold onto something. Her memory is not good. She was having trouble with constipation, she has no sensation to have a BM . I  Told pt to bring her family with her to appt tomorrow.

## 2014-02-20 ENCOUNTER — Telehealth: Payer: Self-pay | Admitting: Internal Medicine

## 2014-02-20 ENCOUNTER — Ambulatory Visit (HOSPITAL_BASED_OUTPATIENT_CLINIC_OR_DEPARTMENT_OTHER): Payer: Medicare Other

## 2014-02-20 ENCOUNTER — Encounter: Payer: Self-pay | Admitting: Internal Medicine

## 2014-02-20 ENCOUNTER — Ambulatory Visit (HOSPITAL_BASED_OUTPATIENT_CLINIC_OR_DEPARTMENT_OTHER): Payer: Medicare Other | Admitting: Internal Medicine

## 2014-02-20 ENCOUNTER — Other Ambulatory Visit (HOSPITAL_BASED_OUTPATIENT_CLINIC_OR_DEPARTMENT_OTHER): Payer: Medicare Other

## 2014-02-20 VITALS — BP 137/52 | HR 83 | Temp 98.2°F | Resp 19 | Ht 66.0 in | Wt 119.5 lb

## 2014-02-20 DIAGNOSIS — C7951 Secondary malignant neoplasm of bone: Secondary | ICD-10-CM

## 2014-02-20 DIAGNOSIS — C3490 Malignant neoplasm of unspecified part of unspecified bronchus or lung: Secondary | ICD-10-CM

## 2014-02-20 DIAGNOSIS — C3491 Malignant neoplasm of unspecified part of right bronchus or lung: Secondary | ICD-10-CM

## 2014-02-20 DIAGNOSIS — C3410 Malignant neoplasm of upper lobe, unspecified bronchus or lung: Secondary | ICD-10-CM

## 2014-02-20 DIAGNOSIS — C7952 Secondary malignant neoplasm of bone marrow: Secondary | ICD-10-CM

## 2014-02-20 LAB — COMPREHENSIVE METABOLIC PANEL (CC13)
ALBUMIN: 2.8 g/dL — AB (ref 3.5–5.0)
ALT: 13 U/L (ref 0–55)
AST: 25 U/L (ref 5–34)
Alkaline Phosphatase: 72 U/L (ref 40–150)
Anion Gap: 7 mEq/L (ref 3–11)
BUN: 20.9 mg/dL (ref 7.0–26.0)
CALCIUM: 7.1 mg/dL — AB (ref 8.4–10.4)
CHLORIDE: 112 meq/L — AB (ref 98–109)
CO2: 22 meq/L (ref 22–29)
Creatinine: 1.3 mg/dL — ABNORMAL HIGH (ref 0.6–1.1)
GLUCOSE: 116 mg/dL (ref 70–140)
POTASSIUM: 4.4 meq/L (ref 3.5–5.1)
Sodium: 141 mEq/L (ref 136–145)
Total Bilirubin: 0.39 mg/dL (ref 0.20–1.20)
Total Protein: 6.1 g/dL — ABNORMAL LOW (ref 6.4–8.3)

## 2014-02-20 LAB — CBC WITH DIFFERENTIAL/PLATELET
BASO%: 0.2 % (ref 0.0–2.0)
BASOS ABS: 0 10*3/uL (ref 0.0–0.1)
EOS%: 2.9 % (ref 0.0–7.0)
Eosinophils Absolute: 0.3 10*3/uL (ref 0.0–0.5)
HEMATOCRIT: 28.8 % — AB (ref 34.8–46.6)
HEMOGLOBIN: 8.9 g/dL — AB (ref 11.6–15.9)
LYMPH%: 6 % — ABNORMAL LOW (ref 14.0–49.7)
MCH: 30.4 pg (ref 25.1–34.0)
MCHC: 30.9 g/dL — ABNORMAL LOW (ref 31.5–36.0)
MCV: 98.3 fL (ref 79.5–101.0)
MONO#: 0.6 10*3/uL (ref 0.1–0.9)
MONO%: 6.7 % (ref 0.0–14.0)
NEUT#: 7.2 10*3/uL — ABNORMAL HIGH (ref 1.5–6.5)
NEUT%: 84.2 % — AB (ref 38.4–76.8)
Platelets: 193 10*3/uL (ref 145–400)
RBC: 2.93 10*6/uL — ABNORMAL LOW (ref 3.70–5.45)
RDW: 15 % — ABNORMAL HIGH (ref 11.2–14.5)
WBC: 8.6 10*3/uL (ref 3.9–10.3)
lymph#: 0.5 10*3/uL — ABNORMAL LOW (ref 0.9–3.3)
nRBC: 0 % (ref 0–0)

## 2014-02-20 LAB — URINE CULTURE

## 2014-02-20 MED ORDER — OXYCODONE HCL ER 20 MG PO T12A
20.0000 mg | EXTENDED_RELEASE_TABLET | Freq: Two times a day (BID) | ORAL | Status: DC
Start: 2014-02-20 — End: 2014-03-03

## 2014-02-20 MED ORDER — DENOSUMAB 120 MG/1.7ML ~~LOC~~ SOLN
120.0000 mg | Freq: Once | SUBCUTANEOUS | Status: AC
  Administered 2014-02-20: 120 mg via SUBCUTANEOUS
  Filled 2014-02-20: qty 1.7

## 2014-02-20 MED ORDER — OXYCODONE-ACETAMINOPHEN 5-325 MG PO TABS: 1.0000 | ORAL_TABLET | Freq: Four times a day (QID) | ORAL | Status: DC | PRN

## 2014-02-20 MED ORDER — ERLOTINIB HCL 100 MG PO TABS
100.0000 mg | ORAL_TABLET | Freq: Every day | ORAL | Status: DC
Start: 2014-02-20 — End: 2014-03-03

## 2014-02-20 NOTE — Progress Notes (Signed)
[place] [phone]   [phone]  OFFICE PROGRESS NOTE  [name], MD [address] [place] [address]  DIAGNOSIS: Metastatic non-small cell lung cancer (T2 NX M1). Presented with right upper lobe lung mass as well as multiple pulmonary nodules and bone metastasis at L3 vertebral body. This was diagnosed in March of 2006.   Genomic Alterations Identified? EGFR [id number], T790M YDXA1O/I loss NOTCH1 splice site 7867-6H>M RICTOR amplification TP53 Y234C BCL2L2 amplification FGF10 amplification - equivocal? NFKBIA amplification NKX2-1 amplification PAX5 loss Additional Disease-relevant Genes with No Reportable Alterations Identified? ERBB2 ALK BRAF RET KRAS MET  PRIOR THERAPY:  1. Status post palliative radiotherapy to the L3 lesion under the care of Dr. Tammi Klippel completed September 17, 2004. 2. Status post 6 cycles of systemic chemotherapy with carboplatin and paclitaxel. Last dose was given January 01, 2005. 3. Status post palliative radiotherapy to the right upper lobe lung mass under the care of Dr. Tammi Klippel completed on 12/29/2010. 4. Status post treatment with Tarceva 150 mg p.o. daily started April 2007 through July 2012. 5. Tarceva 100 mg p.o. daily started July 2012. 6. Systemic chemotherapy with Carboplatin for AUC 5 and Alimta 500 mg/m2 given every 3 weeks. Status post 6 cycles. 7. Maintenance chemotherapy with single agent Alimta at 500 mg per meter squared given every 3 weeks, status post 1 cycle given on 07/30/2012.  CURRENT THERAPY:  1. Zometa 4 mg IV q. 3 months for bone metastasis. 2. Tarceva 100 mg by mouth daily resumed therapy on 01/07/13  CHEMOTHERAPY INTENT: Palliative  CURRENT # OF CHEMOTHERAPY CYCLES: 14 CURRENT ANTIEMETICS: Compazine  CURRENT SMOKING STATUS: Never Smoker  ORAL CHEMOTHERAPY AND CONSENT: yes  CURRENT BISPHOSPHONATES USE: Zometa every 3 months  PAIN MANAGEMENT: OxyContin    NARCOTICS INDUCED CONSTIPATION: yes but well managed with Senokot-S and as needed Dulcolax  LIVING WILL AND CODE STATUS: Full code  INTERVAL HISTORY: Jamie Munoz 80 y.o. female returns to the clinic today for followup visit accompanied by her niece and 2 other family members. She was found on recent biopsy of the enlarging lung cancer to have positive EGFR mutation as well as post of resistant mutation T790M. She was referred to [facility] for evaluation and consideration of enrollment in a clinical trial with the third generation tyrosine kinase inhibitor but unfortunately the patient was in eligible for the trial because of her poor performance status and scoring poorly on the Mini-Mental status scale. She continues to complain of constipation as well as incontinence of urine and bowel movements. She has a history of urinary retention and currently has Foley"s catheter in place. She is followed by Dr. Risa Grill for her urinary retention. She denied having any significant chest pain, shortness of breath, cough or hemoptysis. She has no fever or chills. She lost a lot of weight recently. She is here today for evaluation and discussion of her biopsy results and recommendation regarding treatment of her condition.  MEDICAL HISTORY: Past Medical History  Diagnosis Date  . Cancer   . Lung cancer   . Hypertension   . Cellulitis of left lower extremity 07/16/2012    ALLERGIES:  is allergic to codeine.  MEDICATIONS:  Current Outpatient Prescriptions  Medication Sig Dispense Refill  . docusate sodium (COLACE) 100 MG capsule Take 100 mg by mouth 2 (two) times daily as needed for constipation.       . erlotinib (TARCEVA) 100 MG tablet Take 1 tablet (100  mg total) by mouth daily. Take on an empty stomach 1 hour before meals or 2 hours after  30 tablet  0  . gabapentin (NEURONTIN) 100 MG capsule Take 200 mg by mouth 3 (three) times daily.       Marland Kitchen gentamicin (GARAMYCIN) 0.3 % ophthalmic solution Place  1 drop into the left eye 4 (four) times daily. Patient began on 02/08/14 until bottle completed.      . naproxen sodium (ANAPROX) 220 MG tablet Take 220 mg by mouth 2 (two) times daily as needed (for pain.).       Marland Kitchen olmesartan (BENICAR) 20 MG tablet Take 20 mg by mouth daily.       . OxyCODONE (OXYCONTIN) 20 mg T12A 12 hr tablet Take 20 mg by mouth every 12 (twelve) hours.      . polyethylene glycol (MIRALAX / GLYCOLAX) packet Take 17 g by mouth daily with breakfast.      . zolendronic acid (ZOMETA) 4 MG/5ML injection Inject 4 mg into the vein every 3 (three) months.       . cefixime (SUPRAX) 400 MG tablet Take 1 tablet (400 mg total) by mouth daily. X 7 days  7 tablet  0  . cephALEXin (KEFLEX) 500 MG capsule Take 1 capsule (500 mg total) by mouth 3 (three) times daily.  30 capsule  0   No current facility-administered medications for this visit.   Facility-Administered Medications Ordered in Other Visits  Medication Dose Route Frequency Provider Last Rate Last Dose  . influenza  inactive virus vaccine (FLUZONE/FLUARIX) injection 0.5 mL  0.5 mL Intramuscular Once Curt Bears, MD      . sodium chloride 0.9 % injection 10 mL  10 mL Intravenous PRN Curt Bears, MD   10 mL at 11/01/13 0948    SURGICAL HISTORY:  Past Surgical History  Procedure Laterality Date  . Cholecystectomy    . Abdominal hysterectomy    . Breast surgery    . Vaginal sling      REVIEW OF SYSTEMS:  Constitutional: positive for fatigue Eyes: negative Ears, nose, mouth, throat, and face: negative Respiratory: negative Cardiovascular: negative Gastrointestinal: negative Genitourinary:positive for decreased stream and Urinary retention Integument/breast: negative Hematologic/lymphatic: negative Musculoskeletal:positive for back pain Neurological: negative Behavioral/Psych: negative Endocrine: negative Allergic/Immunologic: negative   PHYSICAL EXAMINATION: General appearance: alert, cooperative and no  distress Head: Normocephalic, without obvious abnormality, atraumatic Neck: no adenopathy, no JVD, supple, symmetrical, trachea midline and thyroid not enlarged, symmetric, no tenderness/mass/nodules Lymph nodes: Cervical, supraclavicular, and axillary nodes normal. Resp: clear to auscultation bilaterally Back: symmetric, no curvature. ROM normal. No CVA tenderness. Cardio: regular rate and rhythm, S1, S2 normal, no murmur, click, rub or gallop GI: soft, non-tender; bowel sounds normal; no masses,  no organomegaly Extremities: extremities normal, atraumatic, no cyanosis or edema Neurologic: Alert and oriented X 3, normal strength and tone. Normal symmetric reflexes. Normal coordination and gait  ECOG PERFORMANCE STATUS: 2 - Symptomatic, <50% confined to bed  Blood pressure 137/52, pulse 83, temperature 98.2 F (36.8 C), temperature source Oral, resp. rate 19, height _0  (1.676 m), weight 119 lb 8 oz (54.205 kg).  LABORATORY DATA: Lab Results  Component Value Date   WBC 8.6 02/20/2014   HGB 8.9* 02/20/2014   HCT 28.8* 02/20/2014   MCV 98.3 02/20/2014   PLT 193 02/20/2014      Chemistry      Component Value Date/Time   NA 141 02/20/2014 1027   NA 136* 02/02/2014 0800  NA 139 09/02/2011 1406   K 4.4 02/20/2014 1027   K 5.2 02/02/2014 0800   K 3.9 09/02/2011 1406   CL 102 02/02/2014 0800   CL 105 10/02/2012 1056   CL 97* 09/02/2011 1406   CO2 22 02/20/2014 1027   CO2 22 02/02/2014 0800   CO2 29 09/02/2011 1406   BUN 20.9 02/20/2014 1027   BUN 43* 02/02/2014 0800   BUN 18 09/02/2011 1406   CREATININE 1.3* 02/20/2014 1027   CREATININE 1.70* 02/02/2014 0800   CREATININE 1.2 09/02/2011 1406      Component Value Date/Time   CALCIUM 7.1* 02/20/2014 1027   CALCIUM 7.7* 02/02/2014 0800   CALCIUM 8.0 09/02/2011 1406   ALKPHOS 72 02/20/2014 1027   ALKPHOS 85 02/02/2014 0800   ALKPHOS 71 09/02/2011 1406   AST 25 02/20/2014 1027   AST 22 02/02/2014 0800   AST 25 09/02/2011 1406   ALT 13 02/20/2014 1027   ALT 9  02/02/2014 0800   ALT 20 09/02/2011 1406   BILITOT 0.39 02/20/2014 1027   BILITOT 0.7 02/02/2014 0800   BILITOT 0.60 09/02/2011 1406       RADIOGRAPHIC STUDIES: No results found.  ASSESSMENT AND PLAN:  This is a very pleasant 80 years old white female with metastatic non-small cell lung cancer diagnosed in March 2006 and has been on treatment with several chemotherapy regimen including almost 5 years of Tarceva 150 mg by mouth daily and she is currently on Tarceva 100 mg by mouth daily and tolerating it fairly well with no significant skin rash or diarrhea. She status post 13 months of treatment. Her recent molecular studies showed the presence of resistant EGFR mutation. Unfortunately the patient was in eligible for the clinical trial at [facility] for the third generation EGFR tyrosine kinase inhibitor I have a lengthy discussion with the patient and her family today about her current disease status and treatment options. I recommended for the patient to continue her current treatment with Tarceva for now. We hope that 1 or 2 of the third generation EGFR tyrosine kinase inhibitor would be approved in the next few months. At that time we will switch the patient to the new treatment. For the back pain as well as the urinary and bowel incontinence, I ordered MRI of the brain in addition to MRI of the thoracolumbar spine, I also referred the patient to Dr. Tammi Klippel for evaluation and treatment of any metastatic disease in these area.  For pain management she will continue on OxyContin 20 mg by mouth each bedtime in addition to  Percocet during the day for breakthrough pain. She was given a refill for Tarceva as well as OxyContin and Percocet today. She would come back for followup visit in one month for reevaluation and management any adverse effect of her treatment. She was advised to call immediately if she has any concerning symptoms in the interval  The patient voices understanding of current  disease status and treatment options and is in agreement with the current care plan.  All questions were answered. The patient knows to call the clinic with any problems, questions or concerns. We can certainly see the patient much sooner if necessary.  Disclaimer: This note was dictated with voice recognition software. Similar sounding words can inadvertently be transcribed and may not be corrected upon review.

## 2014-02-20 NOTE — Telephone Encounter (Signed)
gv adn printed appt sched and avs for pt dfor ept....pt sched for 9.14 @ 3pm

## 2014-02-21 ENCOUNTER — Ambulatory Visit: Payer: Medicare Other

## 2014-02-21 ENCOUNTER — Other Ambulatory Visit: Payer: Medicare Other

## 2014-02-21 ENCOUNTER — Telehealth (HOSPITAL_BASED_OUTPATIENT_CLINIC_OR_DEPARTMENT_OTHER): Payer: Self-pay | Admitting: Emergency Medicine

## 2014-02-21 NOTE — Telephone Encounter (Signed)
Post ED Visit - Positive Culture Follow-up  Culture report reviewed by antimicrobial stewardship pharmacist: []  Wes [place], Pharm.D., BCPS []  Heide Guile, Pharm.D., BCPS []  Alycia Rossetti, Pharm.D., BCPS []  [place], Pharm.D., BCPS, AAHIVP []  Legrand Como, Pharm.D., BCPS, AAHIVP []  Carly Sabat, Pharm.D. [x]  Elenor Quinones, Pharm.D.  Positive urine culture >100,000 colonies/ml Klebsiella Pneumoniae, Proteus Mirabilis Treated with Cephalexin 500mg  po caps tid x 10 days, organism sensitive to the same and no further patient follow-up is required at this time.  Hazle Nordmann 02/21/2014, 10:09 AM

## 2014-02-22 ENCOUNTER — Other Ambulatory Visit: Payer: Self-pay | Admitting: Internal Medicine

## 2014-02-25 ENCOUNTER — Ambulatory Visit: Payer: Medicare Other | Admitting: Nutrition

## 2014-02-25 ENCOUNTER — Telehealth: Payer: Self-pay | Admitting: Medical Oncology

## 2014-02-25 NOTE — Telephone Encounter (Addendum)
Can she take Advil 200 mg /day for pain at night . They are only giving percocet 2 times during day only and MS contin hs. Per Dr Julien Nordmann it is okay to take advil if she wakes up at night.

## 2014-02-25 NOTE — Progress Notes (Signed)
Family member contacted me by phone saying she could not get patient to nutrition appointment secondary to patient's fatigue and weakness.  Patient has been struggling with constipation and poor appetite with weight loss.  Family member requested nutrition information to be left for her at the front desk.  Family member will contact me with questions after she reviews education material.  **Disclaimer: This note was dictated with voice recognition software. Similar sounding words can inadvertently be transcribed and this note may contain transcription errors which may not have been corrected upon publication of note.**

## 2014-02-26 ENCOUNTER — Ambulatory Visit: Payer: Medicare Other

## 2014-02-26 ENCOUNTER — Ambulatory Visit: Payer: Medicare Other | Admitting: Radiation Oncology

## 2014-02-26 ENCOUNTER — Ambulatory Visit
Admission: RE | Admit: 2014-02-26 | Discharge: 2014-02-26 | Disposition: A | Discharge status: Home / Self Care | Payer: Medicare Other | Source: Ambulatory Visit | Attending: Internal Medicine | Admitting: Internal Medicine

## 2014-02-26 ENCOUNTER — Telehealth: Payer: Self-pay | Admitting: *Deleted

## 2014-02-26 DIAGNOSIS — C3491 Malignant neoplasm of unspecified part of right bronchus or lung: Secondary | ICD-10-CM

## 2014-02-26 MED ORDER — GADOBENATE DIMEGLUMINE 529 MG/ML IV SOLN: 7.0000 mL | Freq: Once | INTRAVENOUS | Status: AC | PRN

## 2014-02-26 NOTE — Telephone Encounter (Signed)
I called pt and she does not want to try the MRI with oral sedation. "I cannot tolerated the loud , beating noise" . I  do not want to do it ". I called Ms Jamie Munoz about pt wishes . She was going to talk to [facility] about having MRI with IV sedation . I told her to let me know what pt wants to do.She is also going to call Magda Paganini at [facility] to come back to see [name] for f/u visit for foley cath care.

## 2014-02-26 NOTE — Telephone Encounter (Signed)
Family member, MS [place]wood,  called and reported Samon could not do MRI today because she experienced chest pain . EMS responded , evaluated pt and discharged her to home. Pt will need sedation before scan due to anxiety. Per Ms Reeda, [place] did not sleep much  last night , frequently asking "is it time to go for test?". She feels she is very anxious and declining rapidly.

## 2014-02-26 NOTE — Telephone Encounter (Signed)
Call received from [place]. Patient at [place] for MRI BRAIN, thoracic and lumbar spine.  C/O "chest pain that is different than the acid reflux she has had in the past.  It does not resolve with movement".  Tech denies patient having any s.o.b, or diaphoresis, but the pain is worse than she has experienced.  Instructed to have patient go to the ER for evaluation.  MRI Tech given phone states Ms. Tancredi cannot finish this three hour test and asked that we notify Dr. Julien Nordmann.  They are going to come to see Dr. Julien Nordmann.  Again expressed that the Emergency Department is where she needs to go for evaluation of chest pain.  Asked if their policy is for emergency transport calling 911 or if they allow family to drive.  Will notify Dr. Julien Nordmann.  Next scheduled f/u 03-13-2014.

## 2014-02-28 ENCOUNTER — Telehealth: Payer: Self-pay | Admitting: *Deleted

## 2014-02-28 ENCOUNTER — Telehealth: Payer: Self-pay | Admitting: Radiation Oncology

## 2014-02-28 DIAGNOSIS — C3490 Malignant neoplasm of unspecified part of unspecified bronchus or lung: Secondary | ICD-10-CM

## 2014-02-28 NOTE — Telephone Encounter (Signed)
Spoke with [employer].  She talked with pt about doing the MRI with sedation.  Pt agreed.  Dr Vista Mink aware.  New orders placed for MRI of the brain, spine with sedation.  Called Dr Johny Shears RN informing her that MRI will be delayed so they may want to delay their appt with patient until after the MRI is done.  Onc tx schedule filled out.  SLJ

## 2014-02-28 NOTE — Telephone Encounter (Signed)
Phoned patient's mobile and her sister, Jamie Munoz, answered. Jamie Munoz reports that both her and the patient wish to wait until after the MRI to have a consult with Dr. Tammi Klippel. She understands that Jamie Munoz will be in touch with a new reconsult date and time. She requested to be contact as well as the niece, Jamie Munoz, with new appt date and time.

## 2014-03-02 NOTE — ED Provider Notes (Signed)
Medical screening examination/treatment/procedure(s) were performed by non-physician practitioner and as supervising physician I was immediately available for consultation/collaboration.   EKG Interpretation None       Leota Jacobsen, MD 03/02/14 1537

## 2014-03-03 ENCOUNTER — Inpatient Hospital Stay (HOSPITAL_COMMUNITY): Payer: Medicare Other

## 2014-03-03 ENCOUNTER — Emergency Department (HOSPITAL_COMMUNITY): Payer: Medicare Other

## 2014-03-03 ENCOUNTER — Encounter (HOSPITAL_COMMUNITY): Payer: Self-pay | Admitting: Emergency Medicine

## 2014-03-03 ENCOUNTER — Ambulatory Visit: Admission: RE | Admit: 2014-03-03 | Payer: Medicare Other | Source: Ambulatory Visit | Admitting: Radiation Oncology

## 2014-03-03 ENCOUNTER — Inpatient Hospital Stay (HOSPITAL_COMMUNITY)
Admission: EM | Admit: 2014-03-03 | Discharge: 2014-03-11 | DRG: 682 | Disposition: A | Discharge status: Hospice | Payer: Medicare Other | Attending: Internal Medicine | Admitting: Internal Medicine

## 2014-03-03 ENCOUNTER — Ambulatory Visit: Payer: Medicare Other

## 2014-03-03 DIAGNOSIS — I129 Hypertensive chronic kidney disease with stage 1 through stage 4 chronic kidney disease, or unspecified chronic kidney disease: Secondary | ICD-10-CM | POA: Diagnosis present

## 2014-03-03 DIAGNOSIS — N189 Chronic kidney disease, unspecified: Secondary | ICD-10-CM | POA: Diagnosis present

## 2014-03-03 DIAGNOSIS — A4902 Methicillin resistant Staphylococcus aureus infection, unspecified site: Secondary | ICD-10-CM | POA: Diagnosis present

## 2014-03-03 DIAGNOSIS — E860 Dehydration: Secondary | ICD-10-CM | POA: Diagnosis present

## 2014-03-03 DIAGNOSIS — Z515 Encounter for palliative care: Secondary | ICD-10-CM

## 2014-03-03 DIAGNOSIS — D649 Anemia, unspecified: Secondary | ICD-10-CM | POA: Diagnosis present

## 2014-03-03 DIAGNOSIS — Z8249 Family history of ischemic heart disease and other diseases of the circulatory system: Secondary | ICD-10-CM | POA: Diagnosis not present

## 2014-03-03 DIAGNOSIS — C3490 Malignant neoplasm of unspecified part of unspecified bronchus or lung: Secondary | ICD-10-CM | POA: Diagnosis present

## 2014-03-03 DIAGNOSIS — C3491 Malignant neoplasm of unspecified part of right bronchus or lung: Secondary | ICD-10-CM

## 2014-03-03 DIAGNOSIS — E875 Hyperkalemia: Secondary | ICD-10-CM | POA: Diagnosis present

## 2014-03-03 DIAGNOSIS — K5900 Constipation, unspecified: Secondary | ICD-10-CM | POA: Diagnosis present

## 2014-03-03 DIAGNOSIS — G9340 Encephalopathy, unspecified: Secondary | ICD-10-CM | POA: Diagnosis present

## 2014-03-03 DIAGNOSIS — R627 Adult failure to thrive: Secondary | ICD-10-CM | POA: Diagnosis present

## 2014-03-03 DIAGNOSIS — C7931 Secondary malignant neoplasm of brain: Secondary | ICD-10-CM | POA: Diagnosis present

## 2014-03-03 DIAGNOSIS — R4182 Altered mental status, unspecified: Secondary | ICD-10-CM | POA: Diagnosis present

## 2014-03-03 DIAGNOSIS — Z9221 Personal history of antineoplastic chemotherapy: Secondary | ICD-10-CM

## 2014-03-03 DIAGNOSIS — D638 Anemia in other chronic diseases classified elsewhere: Secondary | ICD-10-CM | POA: Diagnosis present

## 2014-03-03 DIAGNOSIS — C7951 Secondary malignant neoplasm of bone: Secondary | ICD-10-CM | POA: Diagnosis present

## 2014-03-03 DIAGNOSIS — G8929 Other chronic pain: Secondary | ICD-10-CM | POA: Diagnosis present

## 2014-03-03 DIAGNOSIS — N390 Urinary tract infection, site not specified: Secondary | ICD-10-CM | POA: Diagnosis present

## 2014-03-03 DIAGNOSIS — N179 Acute kidney failure, unspecified: Principal | ICD-10-CM | POA: Diagnosis present

## 2014-03-03 DIAGNOSIS — J189 Pneumonia, unspecified organism: Secondary | ICD-10-CM | POA: Diagnosis present

## 2014-03-03 DIAGNOSIS — C7949 Secondary malignant neoplasm of other parts of nervous system: Secondary | ICD-10-CM

## 2014-03-03 DIAGNOSIS — D539 Nutritional anemia, unspecified: Secondary | ICD-10-CM

## 2014-03-03 DIAGNOSIS — G936 Cerebral edema: Secondary | ICD-10-CM | POA: Diagnosis present

## 2014-03-03 DIAGNOSIS — IMO0002 Reserved for concepts with insufficient information to code with codable children: Secondary | ICD-10-CM | POA: Diagnosis not present

## 2014-03-03 DIAGNOSIS — C7952 Secondary malignant neoplasm of bone marrow: Secondary | ICD-10-CM | POA: Diagnosis present

## 2014-03-03 DIAGNOSIS — Z923 Personal history of irradiation: Secondary | ICD-10-CM | POA: Diagnosis not present

## 2014-03-03 DIAGNOSIS — J90 Pleural effusion, not elsewhere classified: Secondary | ICD-10-CM | POA: Diagnosis present

## 2014-03-03 DIAGNOSIS — Z978 Presence of other specified devices: Secondary | ICD-10-CM

## 2014-03-03 DIAGNOSIS — M310 Hypersensitivity angiitis: Secondary | ICD-10-CM

## 2014-03-03 DIAGNOSIS — R339 Retention of urine, unspecified: Secondary | ICD-10-CM | POA: Diagnosis present

## 2014-03-03 DIAGNOSIS — E43 Unspecified severe protein-calorie malnutrition: Secondary | ICD-10-CM | POA: Diagnosis present

## 2014-03-03 DIAGNOSIS — Z66 Do not resuscitate: Secondary | ICD-10-CM | POA: Diagnosis present

## 2014-03-03 DIAGNOSIS — Z960 Presence of urogenital implants: Secondary | ICD-10-CM

## 2014-03-03 LAB — URINALYSIS, ROUTINE W REFLEX MICROSCOPIC
Bilirubin Urine: NEGATIVE
Glucose, UA: NEGATIVE mg/dL
Ketones, ur: NEGATIVE mg/dL
NITRITE: NEGATIVE
Protein, ur: NEGATIVE mg/dL
Specific Gravity, Urine: 1.014 (ref 1.005–1.030)
UROBILINOGEN UA: 0.2 mg/dL (ref 0.0–1.0)
pH: 5.5 (ref 5.0–8.0)

## 2014-03-03 LAB — CBC WITH DIFFERENTIAL/PLATELET
BASOS ABS: 0 10*3/uL (ref 0.0–0.1)
BASOS PCT: 0 % (ref 0–1)
Eosinophils Absolute: 0.1 10*3/uL (ref 0.0–0.7)
Eosinophils Relative: 2 % (ref 0–5)
HCT: 27.3 % — ABNORMAL LOW (ref 36.0–46.0)
Hemoglobin: 9.2 g/dL — ABNORMAL LOW (ref 12.0–15.0)
Lymphocytes Relative: 6 % — ABNORMAL LOW (ref 12–46)
Lymphs Abs: 0.4 10*3/uL — ABNORMAL LOW (ref 0.7–4.0)
MCH: 31.4 pg (ref 26.0–34.0)
MCHC: 33.7 g/dL (ref 30.0–36.0)
MCV: 93.2 fL (ref 78.0–100.0)
MONO ABS: 0.7 10*3/uL (ref 0.1–1.0)
Monocytes Relative: 10 % (ref 3–12)
NEUTROS ABS: 6.4 10*3/uL (ref 1.7–7.7)
NEUTROS PCT: 82 % — AB (ref 43–77)
Platelets: 278 10*3/uL (ref 150–400)
RBC: 2.93 MIL/uL — ABNORMAL LOW (ref 3.87–5.11)
RDW: 14 % (ref 11.5–15.5)
WBC: 7.7 10*3/uL (ref 4.0–10.5)

## 2014-03-03 LAB — COMPREHENSIVE METABOLIC PANEL
ALBUMIN: 2.2 g/dL — AB (ref 3.5–5.2)
ALT: 21 U/L (ref 0–35)
ANION GAP: 14 (ref 5–15)
AST: 40 U/L — ABNORMAL HIGH (ref 0–37)
Alkaline Phosphatase: 118 U/L — ABNORMAL HIGH (ref 39–117)
BUN: 57 mg/dL — AB (ref 6–23)
CO2: 25 mEq/L (ref 19–32)
CREATININE: 1.91 mg/dL — AB (ref 0.50–1.10)
Calcium: 9.2 mg/dL (ref 8.4–10.5)
Chloride: 92 mEq/L — ABNORMAL LOW (ref 96–112)
GFR calc Af Amer: 26 mL/min — ABNORMAL LOW (ref >90)
GFR calc non Af Amer: 23 mL/min — ABNORMAL LOW (ref >90)
Glucose, Bld: 111 mg/dL — ABNORMAL HIGH (ref 70–99)
POTASSIUM: 5.5 meq/L — AB (ref 3.7–5.3)
Sodium: 131 mEq/L — ABNORMAL LOW (ref 137–147)
Total Bilirubin: 0.9 mg/dL (ref 0.3–1.2)
Total Protein: 6 g/dL (ref 6.0–8.3)

## 2014-03-03 LAB — I-STAT TROPONIN, ED: TROPONIN I, POC: 0.03 ng/mL (ref 0.00–0.08)

## 2014-03-03 LAB — URINE MICROSCOPIC-ADD ON

## 2014-03-03 LAB — I-STAT CG4 LACTIC ACID, ED: LACTIC ACID, VENOUS: 0.8 mmol/L (ref 0.5–2.2)

## 2014-03-03 LAB — LIPASE, BLOOD: LIPASE: 16 U/L (ref 11–59)

## 2014-03-03 MED ORDER — MORPHINE SULFATE 2 MG/ML IJ SOLN: 1.0000 mg | INTRAMUSCULAR | Status: DC | PRN

## 2014-03-03 MED ORDER — OXYCODONE HCL 5 MG PO TABS
5.0000 mg | ORAL_TABLET | ORAL | Status: DC | PRN
Start: 2014-03-03 — End: 2014-03-11
  Administered 2014-03-04 – 2014-03-10 (×10): 5 mg via ORAL
  Filled 2014-03-03 (×10): qty 1

## 2014-03-03 MED ORDER — LEVOFLOXACIN IN D5W 750 MG/150ML IV SOLN: 750.0000 mg | Freq: Once | INTRAVENOUS | Status: DC

## 2014-03-03 MED ORDER — ACETAMINOPHEN 325 MG PO TABS: 650.0000 mg | ORAL_TABLET | Freq: Four times a day (QID) | ORAL | Status: DC | PRN

## 2014-03-03 MED ORDER — POLYETHYLENE GLYCOL 3350 17 G PO PACK
17.0000 g | PACK | Freq: Two times a day (BID) | ORAL | Status: DC
  Administered 2014-03-03 – 2014-03-10 (×11): 17 g via ORAL
  Filled 2014-03-03 (×17): qty 1

## 2014-03-03 MED ORDER — LEVOFLOXACIN IN D5W 500 MG/100ML IV SOLN
500.0000 mg | Freq: Once | INTRAVENOUS | Status: AC
  Administered 2014-03-03: 500 mg via INTRAVENOUS
  Filled 2014-03-03: qty 100

## 2014-03-03 MED ORDER — SODIUM CHLORIDE 0.9 % IV BOLUS (SEPSIS)
1000.0000 mL | Freq: Once | INTRAVENOUS | Status: AC
  Administered 2014-03-03: 1000 mL via INTRAVENOUS

## 2014-03-03 MED ORDER — PIPERACILLIN-TAZOBACTAM IN DEX 2-0.25 GM/50ML IV SOLN
2.2500 g | Freq: Four times a day (QID) | INTRAVENOUS | Status: DC
  Administered 2014-03-03 – 2014-03-04 (×2): 2.25 g via INTRAVENOUS
  Filled 2014-03-03 (×4): qty 50

## 2014-03-03 MED ORDER — DOCUSATE SODIUM 100 MG PO CAPS
100.0000 mg | ORAL_CAPSULE | Freq: Two times a day (BID) | ORAL | Status: DC
  Administered 2014-03-03 – 2014-03-06 (×6): 100 mg via ORAL
  Filled 2014-03-03 (×7): qty 1

## 2014-03-03 MED ORDER — VANCOMYCIN HCL IN DEXTROSE 750-5 MG/150ML-% IV SOLN
750.0000 mg | INTRAVENOUS | Status: DC
  Administered 2014-03-03: 750 mg via INTRAVENOUS
  Filled 2014-03-03 (×2): qty 150

## 2014-03-03 MED ORDER — SODIUM CHLORIDE 0.9 % IV SOLN
INTRAVENOUS | Status: AC
  Administered 2014-03-03: 19:00:00 via INTRAVENOUS

## 2014-03-03 MED ORDER — PIPERACILLIN-TAZOBACTAM 3.375 G IVPB: 3.3750 g | Freq: Three times a day (TID) | INTRAVENOUS | Status: DC

## 2014-03-03 MED ORDER — LORAZEPAM 2 MG/ML IJ SOLN
0.5000 mg | Freq: Once | INTRAMUSCULAR | Status: AC | PRN
  Administered 2014-03-03: 0.5 mg via INTRAVENOUS
  Filled 2014-03-03: qty 1

## 2014-03-03 MED ORDER — SODIUM POLYSTYRENE SULFONATE 15 GM/60ML PO SUSP
30.0000 g | Freq: Once | ORAL | Status: AC
  Administered 2014-03-03: 30 g via ORAL
  Filled 2014-03-03: qty 120

## 2014-03-03 MED ORDER — ALBUTEROL SULFATE (2.5 MG/3ML) 0.083% IN NEBU: 2.5000 mg | INHALATION_SOLUTION | RESPIRATORY_TRACT | Status: DC | PRN

## 2014-03-03 MED ORDER — ACETAMINOPHEN 650 MG RE SUPP: 650.0000 mg | Freq: Four times a day (QID) | RECTAL | Status: DC | PRN

## 2014-03-03 MED ORDER — ONDANSETRON HCL 4 MG/2ML IJ SOLN
4.0000 mg | Freq: Four times a day (QID) | INTRAMUSCULAR | Status: DC | PRN
  Administered 2014-03-06 – 2014-03-09 (×4): 4 mg via INTRAVENOUS
  Filled 2014-03-03 (×4): qty 2

## 2014-03-03 MED ORDER — SODIUM CHLORIDE 0.9 % IJ SOLN
3.0000 mL | Freq: Two times a day (BID) | INTRAMUSCULAR | Status: DC
  Administered 2014-03-03 – 2014-03-10 (×4): 3 mL via INTRAVENOUS

## 2014-03-03 MED ORDER — OXYCODONE HCL ER 10 MG PO T12A
10.0000 mg | EXTENDED_RELEASE_TABLET | Freq: Two times a day (BID) | ORAL | Status: DC
  Administered 2014-03-03 – 2014-03-10 (×14): 10 mg via ORAL
  Filled 2014-03-03 (×15): qty 1

## 2014-03-03 MED ORDER — ONDANSETRON HCL 4 MG PO TABS: 4.0000 mg | ORAL_TABLET | Freq: Four times a day (QID) | ORAL | Status: DC | PRN

## 2014-03-03 MED ORDER — HYDROMORPHONE HCL PF 1 MG/ML IJ SOLN
0.2500 mg | INTRAMUSCULAR | Status: DC | PRN
  Administered 2014-03-05 – 2014-03-06 (×6): 0.25 mg via INTRAVENOUS
  Filled 2014-03-03 (×6): qty 1

## 2014-03-03 MED ORDER — OXYCODONE-ACETAMINOPHEN 5-325 MG PO TABS
1.0000 | ORAL_TABLET | Freq: Once | ORAL | Status: AC
  Administered 2014-03-03: 1 via ORAL
  Filled 2014-03-03: qty 1

## 2014-03-03 MED ORDER — HEPARIN SODIUM (PORCINE) 5000 UNIT/ML IJ SOLN
5000.0000 [IU] | Freq: Three times a day (TID) | INTRAMUSCULAR | Status: DC
  Administered 2014-03-03 – 2014-03-06 (×9): 5000 [IU] via SUBCUTANEOUS
  Filled 2014-03-03 (×11): qty 1

## 2014-03-03 NOTE — ED Notes (Signed)
Hospitalist at bedside 

## 2014-03-03 NOTE — ED Notes (Addendum)
Per EMS-c/o abdominal pain since last Friday. MRI completed. No BM since Friday. On Percocet (325mg ) and Oxycontin (20 mg)-last taken around 0730. C/o RLQ pain, non radiating. Hx lung CA (currently receiving treatment) and dementia. Denies N, V. VS: BP 112/56 HR 108 SpO2 93% on RA.

## 2014-03-03 NOTE — ED Notes (Addendum)
Initial contact-pt alert but only oriented to self and place. Patient poor historian. Denies abdominal pain but endorses "bone pain" in the right shoulder with activity. 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 Per nephew-patient having RLQ "severe" abdominal pain this morning, unrelieved by pain medicine.  In NAD. Awaiting PA/MD.

## 2014-03-03 NOTE — H&P (Signed)
[facility] History and Physical  Jamie Munoz:[id number] DOB: 06/04/1927 DOA: 03/03/2014   PCP: [name], MD  Specialists: Dr. Julien Nordmann is her oncologist  Chief Complaint: Pain in the right chest, Weakness and worsening confusion for the past few weeks  HPI: Jamie Munoz is a 80 y.o. 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 Patient is accompanied by her nephews. Patient was originally diagnosed with lung cancer about 9 years ago. She has been on Tarceva for many years. Recently, her cancer has mutated. No alternative treatment is available currently. Over the last few months patient has had a decline. Imaging studies done in June, showed worsening in her lung mass with adrenal metastases. At that time MRI showed metastatic process in the spine. She hasn't received any radiation treatment. She was seen by her oncologist recently and plan was to repeat the MRI and to consider referral to radiation oncology. However, patient could not tolerate the MRI and one is planned for October with sedation. According to the family patient has been having worsening confusion over the last few weeks. She's had worsening ability to walk in the last week to 2 weeks. Her oral intake has been poor. She's also had a cough for the last 2 days with mucus expectoration. They have also noticed some blood in the sputum. She has found it difficult to swallow food. No nausea, vomiting, no abdominal pain. She's lost about 30 lbs in last 6 months. She also is complaining of pain in her right chest area since this morning. Patient was not able to provide any history due to her confusion.  Home Medications: Prior to Admission medications   Medication Sig Start Date End Date Taking? Authorizing Provider  docusate sodium (COLACE) 100 MG  capsule Take 100 mg by mouth 2 (two) times daily as needed for constipation.    Yes Historical Provider, MD  erlotinib (TARCEVA) 100 MG tablet Take 100 mg by mouth daily. Take on an empty stomach 1 hour before meals or 2 hours after   Yes Historical Provider, MD  gabapentin (NEURONTIN) 100 MG capsule Take 200 mg by mouth 3 (three) times daily.   Yes Historical Provider, MD  gentamicin (GARAMYCIN) 0.3 % ophthalmic solution Place 1 drop into the left eye 4 (four) times daily. Patient began on 02/08/14 until bottle completed.   Yes Historical Provider, MD  naproxen sodium (ANAPROX) 220 MG tablet Take 220 mg by mouth 2 (two) times daily as needed (for pain.).    Yes Historical Provider, MD  olmesartan (BENICAR) 20 MG tablet Take 20 mg by mouth daily.    Yes Historical Provider, MD  OxyCODONE (OXYCONTIN) 20 mg T12A 12 hr tablet Take 20 mg by mouth every evening.    Yes Historical Provider, MD  oxyCODONE-acetaminophen (PERCOCET/ROXICET) 5-325 MG per tablet Take 1 tablet by mouth 2 (two) times daily.    Yes Historical Provider, MD  polyethylene glycol (MIRALAX / GLYCOLAX) packet Take 17 g by mouth daily with breakfast.   Yes Historical Provider, MD  zolendronic acid (ZOMETA) 4 MG/5ML injection Inject 4 mg into the vein every 3 (three) months.    Yes Historical Provider, MD    Allergies:  Allergies  Allergen Reactions  . Codeine Shortness Of Breath    Past Medical History: Past Medical History  Diagnosis Date  .  Cancer   . Lung cancer   . Hypertension   . Cellulitis of left lower extremity 07/16/2012    Past Surgical History  Procedure Laterality Date  . Cholecystectomy    . Abdominal hysterectomy    . Breast surgery    . Vaginal sling      Social History: She lives in [place] by herself. She has caregivers and other family members to take care of her. No history of smoking, alcohol use or illicit drug use. Up until recently she was able to ambulate using a walker.  Family History:    Family History  Problem Relation Age of Onset  . Cancer Mother   . Heart attack Father   . Cancer Brother   . Cancer Brother   . Cancer Brother      Review of Systems - unable to do due to her mental status  Physical Examination  Filed Vitals:   03/03/14 1121 03/03/14 1356 03/03/14 1623  BP: 125/72 122/51 115/44  Pulse: 107 67   Temp: 98.9 F (37.2 C) 98.5 F (36.9 C)   TempSrc: Oral Oral   Resp: _0 SpO2: 95% 90%     BP 115/44  Pulse 67  Temp(Src) 98.5 F (36.9 C) (Oral)  Resp 22  SpO2 90%  General appearance: appears stated age, distracted, fatigued, no distress and slowed mentation Head: Normocephalic, without obvious abnormality, atraumatic Eyes: conjunctivae/corneas clear. PERRL, EOM's intact.  Throat: Dry mucous membranes Resp: Decreased air entry at the right base with a few crackles. No wheezing. Cardio: regular rate and rhythm, S1, S2 normal, no murmur, click, rub or gallop GI: soft, non-tender; bowel sounds normal; no masses,  no organomegaly and Nonspecific discomfort Extremities: extremities normal, atraumatic, no cyanosis or edema Pulses: 2+ and symmetric Skin: Skin color, texture, turgor normal. No rashes or lesions Neurologic: She is fatigued. Easily arousable. No facial droop. Cranial nerves otherwise intact 2-12. Good motor strength bilateral upper extremities. She was able to lift her right leg off the bed better than the left. She was able to push down on my hands with her feet. Reflexes were equal. Gait was not assessed.  Laboratory Data: Results for orders placed during the hospital encounter of 03/03/14 (from the past 48 hour(s))  CBC WITH DIFFERENTIAL     Status: Abnormal   Collection Time    03/03/14 12:43 PM      Result Value Ref Range   WBC 7.7  4.0 - 10.5 K/uL   RBC 2.93 (*) 3.87 - 5.11 MIL/uL   Hemoglobin 9.2 (*) 12.0 - 15.0 g/dL   HCT 27.3 (*) 36.0 - 46.0 %   MCV 93.2  78.0 - 100.0 fL   MCH 31.4  26.0 - 34.0 pg   MCHC 33.7   30.0 - 36.0 g/dL   RDW 14.0  11.5 - 15.5 %   Platelets 278  150 - 400 K/uL   Neutrophils Relative % 82 (*) 43 - 77 %   Neutro Abs 6.4  1.7 - 7.7 K/uL   Lymphocytes Relative 6 (*) 12 - 46 %   Lymphs Abs 0.4 (*) 0.7 - 4.0 K/uL   Monocytes Relative 10  3 - 12 %   Monocytes Absolute 0.7  0.1 - 1.0 K/uL   Eosinophils Relative 2  0 - 5 %   Eosinophils Absolute 0.1  0.0 - 0.7 K/uL   Basophils Relative 0  0 - 1 %   Basophils Absolute 0.0  0.0 - 0.1  K/uL  COMPREHENSIVE METABOLIC PANEL     Status: Abnormal   Collection Time    03/03/14 12:43 PM      Result Value Ref Range   Sodium 131 (*) 137 - 147 mEq/L   Potassium 5.5 (*) 3.7 - 5.3 mEq/L   Chloride 92 (*) 96 - 112 mEq/L   CO2 25  19 - 32 mEq/L   Glucose, Bld 111 (*) 70 - 99 mg/dL   BUN 57 (*) 6 - 23 mg/dL   Creatinine, Ser 1.91 (*) 0.50 - 1.10 mg/dL   Calcium 9.2  8.4 - 10.5 mg/dL   Total Protein 6.0  6.0 - 8.3 g/dL   Albumin 2.2 (*) 3.5 - 5.2 g/dL   AST 40 (*) 0 - 37 U/L   ALT 21  0 - 35 U/L   Alkaline Phosphatase 118 (*) 39 - 117 U/L   Total Bilirubin 0.9  0.3 - 1.2 mg/dL   GFR calc non Af Amer 23 (*) >90 mL/min   GFR calc Af Amer 26 (*) >90 mL/min   Comment: (NOTE)     The eGFR has been calculated using the CKD EPI equation.     This calculation has not been validated in all clinical situations.     eGFR's persistently <90 mL/min signify possible Chronic Kidney     Disease.   Anion gap 14  5 - 15  LIPASE, BLOOD     Status: None   Collection Time    03/03/14 12:43 PM      Result Value Ref Range   Lipase 16  11 - 59 U/L  I-STAT CG4 LACTIC ACID, ED     Status: None   Collection Time    03/03/14 12:52 PM      Result Value Ref Range   Lactic Acid, Venous 0.80  0.5 - 2.2 mmol/L  URINALYSIS, ROUTINE W REFLEX MICROSCOPIC     Status: Abnormal   Collection Time    03/03/14  1:10 PM      Result Value Ref Range   Color, Urine YELLOW  YELLOW   APPearance CLOUDY (*) CLEAR   Specific Gravity, Urine 1.014  1.005 - 1.030   pH 5.5   5.0 - 8.0   Glucose, UA NEGATIVE  NEGATIVE mg/dL   Hgb urine dipstick SMALL (*) NEGATIVE   Bilirubin Urine NEGATIVE  NEGATIVE   Ketones, ur NEGATIVE  NEGATIVE mg/dL   Protein, ur NEGATIVE  NEGATIVE mg/dL   Urobilinogen, UA 0.2  0.0 - 1.0 mg/dL   Nitrite NEGATIVE  NEGATIVE   Leukocytes, UA MODERATE (*) NEGATIVE  URINE MICROSCOPIC-ADD ON     Status: Abnormal   Collection Time    03/03/14  1:10 PM      Result Value Ref Range   WBC, UA 11-20  <3 WBC/hpf   RBC / HPF 0-2  <3 RBC/hpf   Bacteria, UA FEW (*) RARE  I-STAT TROPOININ, ED     Status: None   Collection Time    03/03/14  2:15 PM      Result Value Ref Range   Troponin i, poc 0.03  0.00 - 0.08 ng/mL   Comment 3            Comment: Due to the release kinetics of cTnI,     a negative result within the first hours     of the onset of symptoms does not rule out     myocardial infarction with certainty.     If  myocardial infarction is still suspected,     repeat the test at appropriate intervals.    Radiology Reports: Dg Abd Acute W/chest  03/03/2014   CLINICAL DATA:  Chest and abdominal pain.  EXAM: ACUTE ABDOMEN SERIES (ABDOMEN 2 VIEW & CHEST 1 VIEW)  COMPARISON:  December 02, 2013.  FINDINGS: Stable cardiac silhouette. Right internal jugular Port-A-Cath is again noted with distal tip in expected position of the SVC. Interval development of moderate right pleural effusion with probable underlying atelectasis. Right upper lobe opacity is noted concerning for atelectasis or pneumonia. No pneumothorax is noted. Left lung is clear. Stool is noted throughout the colon. There is no evidence of bowel obstruction or ileus. Multilevel degenerative disc disease is noted in the lumbar spine.  IMPRESSION: No evidence of bowel obstruction or ileus. Stool is noted throughout the colon which may represent constipation. Interval development of moderate right pleural effusion with probable underlying atelectasis. Increased right upper lobe opacity is noted  concerning for pneumonia or atelectasis.   Electronically Signed   By: Sabino Dick M.D.   On: 03/03/2014 13:37    Electrocardiogram: Sinus rhythm at 91 beats per minute. Normal axis. Intervals are normal. No Q waves. Nonspecific T wave, changes. No concerning ST changes  Problem List  Principal Problem:   ARF (acute renal failure) Active Problems:   Lung cancer   Non-small cell carcinoma of lung   Dehydration   Acute encephalopathy   Pleural effusion on right   Pneumonia   Hyperkalemia   Chronic indwelling Foley catheter   UTI (lower urinary tract infection)   Normocytic anemia   Assessment: This is an 81 year old, Caucasian female, with past medical history of metastatic snores, small cell lung cancer, who presents with failure to thrive. She's found to have acute renal failure, dehydration, hyperkalemia. She's anemic which is chronic for her. She also has generalized weakness and in the lower extremities. Her acute encephalopathy could be due to her metabolic derangements. She also has a pleural effusion on the right with suggestion of pneumonia. This could all be aspiration or related to the cancer. In all likelihood patient is declining. Her malignancy appears to be getting worse. Her right chest pain is most likely due to the pleural effusion/pneumonia and her lung mass. She also mentioned, hemoptysis, which is also due to the lung mass. Pulmonary embolism was considered in the differential, however there are other reasons present which are more likely.  Plan: #1 acute encephalopathy: This is multifactorial as discussed above. We will proceed with MRI of the brain without contrast to rule out metastatic process in the brain. Treat her infection. Monitor mental status closely. There is no focal neurological deficits.  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 She does have weakness in the legs, but this is apparently, ongoing for a long time. Since MRI of his spine  was planned we will proceed with that as well. Depending on the results, referral may be necessary to radiation oncology. PT/OT will be consulted. Dr. Julien Nordmann will be notified. His input will be crucial in further management of this patient. Based on results of recent testing her prognosis appears to be poor.  #3 chest pain likely due to right-sided pleural effusion with possible pneumonia: She could have aspirated. This could all be due to her malignancy. She has not been hospitalized recently, but is immunosuppressed. For now we will place her on broad-spectrum antibiotics with vancomycin and Zosyn. Swallow evaluation will be obtained. Follow her  clinically. I would defer decision regarding thoracentesis to her oncologist. There is no urgent need for the same.  #4 chronic indwelling Foley catheter with abnormal, UA, suggesting UTI: Her catheter was last changed on August 30. It is changed every month. Urine cultures be followed up on. Antibiotics as above.  #5 dehydration with acute renal failure and hyperkalemia: She'll be given IV fluids. Kayexalate will be given. Monitor urine output. Repeat renal function in the morning. Baseline creatinine, appears to be about 1.4-1.6.  #6 normocytic anemia: This is close to her baseline.  #7 cancer related pain: She was recently started on long acting OxyContin. We will continue, but divide the dose.   DVT Prophylaxis: Heparin Code Status: DO NOT RESUSCITATE based on my conversation with her nephew Family Communication: Discussed with the patient's nephew's  Disposition Plan: Admit to telemetry   Further management decisions will depend on results of further testing and patient's response to treatment.   [facility]  [facility] Pager [phone]  If 7PM-7AM, please contact night-coverage [email or web address] Password [facility]  03/03/2014, 4:56 PM  Disclaimer: This note was dictated with voice recognition software. Similar sounding words can  inadvertently be transcribed and may not be corrected upon review.

## 2014-03-03 NOTE — ED Provider Notes (Signed)
CSN: [id number]     Arrival date & time 03/03/14  1115 History   First MD Initiated Contact with Patient 03/03/14 1209     Chief Complaint  Patient presents with  . Abdominal Pain     (Consider location/radiation/quality/duration/timing/severity/associated sxs/prior Treatment) HPI Jamie Munoz is a 80 y.o. female who presents emergency department complaining of upper right abdominal pain and lower chest pain. Patient states pain began today. States pain is worse with deep breathing and movement. Patient with history of lung cancer, currently on oral chemotherapy. Pt with progressive weakness over last month, increased confusion. Family at bedside concerned. PT unable to provide good history. She is complaining of pain. No fever, chills. She does have increased cough. No bowel movement in 3 days. No nausea, vomiting, diarrhea.   Past Medical History  Diagnosis Date  . Cancer   . Lung cancer   . Hypertension   . Cellulitis of left lower extremity 07/16/2012   Past Surgical History  Procedure Laterality Date  . Cholecystectomy    . Abdominal hysterectomy    . Breast surgery    . Vaginal sling     Family History  Problem Relation Age of Onset  . Cancer Mother   . Heart attack Father   . Cancer Brother   . Cancer Brother   . Cancer Brother    History  Substance Use Topics  . Smoking status: Never Smoker   . Smokeless tobacco: Never Used  . Alcohol Use: No   OB History   Grav Para Term Preterm Abortions TAB SAB Ect Mult Living                 Review of Systems  Constitutional: Positive for fatigue. Negative for fever and chills.  Respiratory: Positive for chest tightness and shortness of breath. Negative for cough.   Cardiovascular: Positive for chest pain. Negative for palpitations and leg swelling.  Gastrointestinal: Positive for abdominal pain. Negative for nausea, vomiting and diarrhea.  Genitourinary: Negative for dysuria, flank pain and pelvic pain.   Musculoskeletal: Negative for arthralgias, myalgias, neck pain and neck stiffness.  Skin: Negative for rash.  Neurological: Positive for weakness. Negative for dizziness and headaches.  All other systems reviewed and are negative.     Allergies  Codeine  Home Medications   Prior to Admission medications   Medication Sig Start Date End Date Taking? Authorizing Provider  docusate sodium (COLACE) 100 MG capsule Take 100 mg by mouth 2 (two) times daily as needed for constipation.    Yes Historical Provider, MD  erlotinib (TARCEVA) 100 MG tablet Take 100 mg by mouth daily. Take on an empty stomach 1 hour before meals or 2 hours after   Yes Historical Provider, MD  gabapentin (NEURONTIN) 100 MG capsule Take 200 mg by mouth 3 (three) times daily.   Yes Historical Provider, MD  gentamicin (GARAMYCIN) 0.3 % ophthalmic solution Place 1 drop into the left eye 4 (four) times daily. Patient began on 02/08/14 until bottle completed.   Yes Historical Provider, MD  naproxen sodium (ANAPROX) 220 MG tablet Take 220 mg by mouth 2 (two) times daily as needed (for pain.).    Yes Historical Provider, MD  olmesartan (BENICAR) 20 MG tablet Take 20 mg by mouth daily.    Yes Historical Provider, MD  OxyCODONE (OXYCONTIN) 20 mg T12A 12 hr tablet Take 20 mg by mouth every evening.    Yes Historical Provider, MD  oxyCODONE-acetaminophen (PERCOCET/ROXICET) 5-325 MG per tablet Take 1  tablet by mouth 2 (two) times daily.    Yes Historical Provider, MD  polyethylene glycol (MIRALAX / GLYCOLAX) packet Take 17 g by mouth daily with breakfast.   Yes Historical Provider, MD  zolendronic acid (ZOMETA) 4 MG/5ML injection Inject 4 mg into the vein every 3 (three) months.    Yes Historical Provider, MD   BP 125/72  Pulse 107  Temp(Src) 98.9 F (37.2 C) (Oral)  Resp 22  SpO2 95% Physical Exam  Nursing note and vitals reviewed. Constitutional: She appears well-developed and well-nourished. No distress.  Frail appearing   HENT:  Head: Normocephalic.  Eyes: Conjunctivae are normal. Pupils are equal, round, and reactive to light.  Neck: Normal range of motion. Neck supple.  Cardiovascular: Normal rate, regular rhythm and normal heart sounds.   Pulmonary/Chest: Effort normal and breath sounds normal. No respiratory distress. She has no wheezes. She has no rales. She exhibits tenderness.  Tender over right lower ribs  Abdominal: Soft. Bowel sounds are normal. She exhibits no distension. There is no tenderness. There is no rebound.  Musculoskeletal: She exhibits no edema.  Neurological: She is alert.  Oriented to self only. Follows directions. He answers most questions appropriately.  Skin: Skin is warm and dry.  Psychiatric: She has a normal mood and affect. Her behavior is normal.    ED Course  Procedures (including critical care time) Labs Review Labs Reviewed  CBC WITH DIFFERENTIAL - Abnormal; Notable for the following:    RBC 2.93 (*)    Hemoglobin 9.2 (*)    HCT 27.3 (*)    Neutrophils Relative % 82 (*)    Lymphocytes Relative 6 (*)    Lymphs Abs 0.4 (*)    All other components within normal limits  COMPREHENSIVE METABOLIC PANEL - Abnormal; Notable for the following:    Sodium 131 (*)    Potassium 5.5 (*)    Chloride 92 (*)    Glucose, Bld 111 (*)    BUN 57 (*)    Creatinine, Ser 1.91 (*)    Albumin 2.2 (*)    AST 40 (*)    Alkaline Phosphatase 118 (*)    GFR calc non Af Amer 23 (*)    GFR calc Af Amer 26 (*)    All other components within normal limits  URINALYSIS, ROUTINE W REFLEX MICROSCOPIC - Abnormal; Notable for the following:    APPearance CLOUDY (*)    Hgb urine dipstick SMALL (*)    Leukocytes, UA MODERATE (*)    All other components within normal limits  URINE MICROSCOPIC-ADD ON - Abnormal; Notable for the following:    Bacteria, UA FEW (*)    All other components within normal limits  LIPASE, BLOOD  I-STAT CG4 LACTIC ACID, ED  I-STAT TROPOININ, ED    Imaging  Review Dg Abd Acute W/chest  03/03/2014   CLINICAL DATA:  Chest and abdominal pain.  EXAM: ACUTE ABDOMEN SERIES (ABDOMEN 2 VIEW & CHEST 1 VIEW)  COMPARISON:  December 02, 2013.  FINDINGS: Stable cardiac silhouette. Right internal jugular Port-A-Cath is again noted with distal tip in expected position of the SVC. Interval development of moderate right pleural effusion with probable underlying atelectasis. Right upper lobe opacity is noted concerning for atelectasis or pneumonia. No pneumothorax is noted. Left lung is clear. Stool is noted throughout the colon. There is no evidence of bowel obstruction or ileus. Multilevel degenerative disc disease is noted in the lumbar spine.  IMPRESSION: No evidence of bowel obstruction or ileus.  Stool is noted throughout the colon which may represent constipation. Interval development of moderate right pleural effusion with probable underlying atelectasis. Increased right upper lobe opacity is noted concerning for pneumonia or atelectasis.   Electronically Signed   By: Sabino Dick M.D.   On: 03/03/2014 13:37     EKG Interpretation   Date/Time:  Monday March 03 2014 13:54:15 EDT Ventricular Rate:  97 PR Interval:  152 QRS Duration: 86 QT Interval:  325 QTC Calculation: 413 R Axis:   -34 Text Interpretation:  Sinus rhythm Probable left ventricular hypertrophy  Nonspecific T abnrm, anterolateral leads Confirmed by Zenia Resides  MD, ANTHONY  ([id number]) on 03/03/2014 2:34:46 PM      MDM   Final diagnoses:  Dehydration  Pleural effusion, right  CAP (community acquired pneumonia)   Pt with generalized weakness. Hx of lung can, pain to the right lower ribs. Will get labs, acute abd series, ua.    Pt with indwelling catheter, chronic UTIs, states Dr. Risa Grill, her urologist, DOES NOT WANT THESE TREATED.   Labs showing elevated BUN, creat. Suspect dehydration. Will admit for rehydration and further evaluation of her right chest pain. Considered PE, however at this time  not tachycardic, not hypoxic, unable to get CT agio due to her creatinine. PT generally weak. Will need admission for further evaluation.   Filed Vitals:   03/03/14 1121 03/03/14 1356  BP: 125/72 122/51  Pulse: 107 67  Temp: 98.9 F (37.2 C) 98.5 F (36.9 C)  TempSrc: Oral Oral  Resp: 22 20  SpO2: 95% 90%    Spoke with [facility], will admit.       Renold Genta, PA-C 03/03/14 1614

## 2014-03-03 NOTE — ED Notes (Signed)
Bed: WA02 Expected date:  Expected time:  Means of arrival:  Comments: EMS-cancer patient-weak

## 2014-03-03 NOTE — Progress Notes (Signed)
  CARE MANAGEMENT ED NOTE 03/03/2014  Patient:  Jamie Munoz, Jamie Munoz   Account Number:  [id number]  Date Initiated:  03/03/2014  Documentation initiated by:  Livia Snellen  Subjective/Objective Assessment:   Patient presents to Ed with decreased oral intake, increased confusion, worsening ability to walk, cough and pain in right chest.     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  Patient's nephwe [name] [phone] nad his wife Malachy Mood [phone]. Patient lives at home alone.  As per patient's family, [facility] have been hired to stay with the patient from 3pm to 7am.  This will be for seven days a week per patient's family.  Patient's family report they will be assisting patient at home as well.  Patient's family confirms patient's pcp is Dr. Kathryne Eriksson and patient's oncologist is Dr. Earlie Server.  Patient does not have any other home health services at this time besides [facility].  Patient has a walker, cane, wheelchair, bedisde commode and a shower chair at home.  Patient's family reports that [facility] made a referral to [place] home health for a home assessment.  Patient's family reports a nurse named Magda Paganini came to speak with them.  Patient lives in [place], [place] not listed on [place] list.  [facility] provided patient's family with list of home health agencies of [facility] and provided them with phone  number to [facility] [place] if needed to find [place].  EDCM explained with home health, the patient may have a visiting RN, PT, Ot aide and social worker if needed.  Patient and patient's family thankful for services.  No further EDCM needs at this time.

## 2014-03-03 NOTE — ED Provider Notes (Signed)
Medical screening examination/treatment/procedure(s) were conducted as a shared visit with non-physician practitioner(s) and myself.  I personally evaluated the patient during the encounter.   EKG Interpretation   Date/Time:  Monday March 03 2014 13:54:15 EDT Ventricular Rate:  97 PR Interval:  152 QRS Duration: 86 QT Interval:  325 QTC Calculation: 413 R Axis:   -34 Text Interpretation:  Sinus rhythm Probable left ventricular hypertrophy  Nonspecific T abnrm, anterolateral leads Confirmed by Zenia Resides  MD, Nell Gales  ([id number]) on 03/03/2014 2:34:46 PM     Patient here with abdominal discomfort and vomiting. Appears to be dehydrated at this time. Will be admitted to medicine  Leota Jacobsen, MD 03/03/14 1447

## 2014-03-03 NOTE — Progress Notes (Signed)
ANTIBIOTIC CONSULT NOTE - INITIAL  Pharmacy Consult for Vanc and Zosyn Indication: pneumonia  Allergies  Allergen Reactions  . Codeine Shortness Of Breath    Patient Measurements: Height: 5\' 6"  (167.6 cm) Weight: 119 lb (53.978 kg) IBW/kg (Calculated) : 59.3 Adjusted Body Weight:   Vital Signs: Temp: 98.1 F (36.7 C) (09/14 1900) Temp src: Oral (09/14 1900) BP: 128/51 mmHg (09/14 1900) Pulse Rate: 67 (09/14 1356) Intake/Output from previous day:   Intake/Output from this shift:    Labs:  Recent Labs  03/03/14 1243  WBC 7.7  HGB 9.2*  PLT 278  CREATININE 1.91*   Estimated Creatinine Clearance: 18.4 ml/min (by C-G formula based on Cr of 1.91). No results found for this basename: Letta Median, VANCORANDOM, [place], GENTPEAK, GENTRANDOM, TOBRATROUGH, TOBRAPEAK, TOBRARND, AMIKACINPEAK, AMIKACINTROU, AMIKACIN,  in the last 72 hours   Microbiology: Recent Results (from the past 720 hour(s))  URINE CULTURE     Status: None   Collection Time    02/02/14  9:41 AM      Result Value Ref Range Status   Specimen Description URINE, CATHETERIZED   Final   Special Requests Normal   Final   Culture  Setup Time     Final   Value: 02/02/2014 20:43     Performed at [place]     Final   Value: >=100,000 COLONIES/ML     Performed at [facility]   Culture     Final   Value: PROTEUS MIRABILIS     Performed at [facility]   Report Status 02/04/2014 FINAL   Final   Organism ID, Bacteria PROTEUS MIRABILIS   Final  URINE CULTURE     Status: None   Collection Time    02/10/14 11:50 PM      Result Value Ref Range Status   Specimen Description URINE, CATHETERIZED   Final   Special Requests NONE   Final   Culture  Setup Time     Final   Value: 02/11/2014 08:44     Performed at [place]     Final   Value: 45,000 COLONIES/ML     Performed at [facility]   Culture     Final   Value:  Multiple bacterial morphotypes present, none predominant. Suggest appropriate recollection if clinically indicated.     Performed at [facility]   Report Status 02/13/2014 FINAL   Final  URINE CULTURE     Status: None   Collection Time    02/16/14  4:54 PM      Result Value Ref Range Status   Specimen Description URINE, CATHETERIZED   Final   Special Requests NONE   Final   Culture  Setup Time     Final   Value: 02/17/2014 02:46     Performed at [place]     Final   Value: >=100,000 COLONIES/ML     Performed at [facility]   Culture     Final   Value: [place]     Performed at [facility]   Report Status 02/20/2014 FINAL   Final   Organism ID, Bacteria KLEBSIELLA PNEUMONIAE   Final   Organism ID, Bacteria PROTEUS MIRABILIS   Final    Medical History: Past Medical History  Diagnosis Date  . Cancer   . Lung cancer   .  Hypertension   . Cellulitis of left lower extremity 07/16/2012    Medications:  Scheduled:  . docusate sodium  100 mg Oral BID  . heparin  5,000 Units Subcutaneous 3 times per day  . OxyCODONE  10 mg Oral Q12H  . piperacillin-tazobactam (ZOSYN)  IV  2.25 g Intravenous Q6H  . polyethylene glycol  17 g Oral BID  . sodium chloride  3 mL Intravenous Q12H  . sodium polystyrene  30 g Oral Once  . vancomycin  750 mg Intravenous Q48H   Infusions:  . sodium chloride 100 mL/hr at 03/03/14 1904   PRN: acetaminophen, acetaminophen, albuterol, LORazepam, morphine injection, ondansetron (ZOFRAN) IV, ondansetron, oxyCODONE Assessment: 80 yo female with possible pneumonia. Patient has a hx of Lung CA (54yr), now with a worsening ability to walk,poor po intake, weakness, worsening pain.   Goal of Therapy:  Vancomycin trough level 15-20 mcg/ml Eradication of infection.  Plan:  1.  Zosyn 2.25 GM IV q 6 hours. 2.  Vancomycin 750mg  IV q 48 hours. Measure antibiotic drug levels at  steady state Follow up culture results  Tanith, Dagostino 03/03/2014,7:23 PM

## 2014-03-04 DIAGNOSIS — C7931 Secondary malignant neoplasm of brain: Secondary | ICD-10-CM

## 2014-03-04 DIAGNOSIS — R5381 Other malaise: Secondary | ICD-10-CM

## 2014-03-04 DIAGNOSIS — R079 Chest pain, unspecified: Secondary | ICD-10-CM

## 2014-03-04 DIAGNOSIS — D649 Anemia, unspecified: Secondary | ICD-10-CM

## 2014-03-04 DIAGNOSIS — R5383 Other fatigue: Secondary | ICD-10-CM

## 2014-03-04 DIAGNOSIS — G9340 Encephalopathy, unspecified: Secondary | ICD-10-CM

## 2014-03-04 DIAGNOSIS — R339 Retention of urine, unspecified: Secondary | ICD-10-CM

## 2014-03-04 DIAGNOSIS — E860 Dehydration: Secondary | ICD-10-CM

## 2014-03-04 DIAGNOSIS — C3490 Malignant neoplasm of unspecified part of unspecified bronchus or lung: Secondary | ICD-10-CM

## 2014-03-04 DIAGNOSIS — R634 Abnormal weight loss: Secondary | ICD-10-CM

## 2014-03-04 DIAGNOSIS — Z9889 Other specified postprocedural states: Secondary | ICD-10-CM

## 2014-03-04 DIAGNOSIS — F29 Unspecified psychosis not due to a substance or known physiological condition: Secondary | ICD-10-CM

## 2014-03-04 DIAGNOSIS — C7949 Secondary malignant neoplasm of other parts of nervous system: Secondary | ICD-10-CM

## 2014-03-04 DIAGNOSIS — N179 Acute kidney failure, unspecified: Principal | ICD-10-CM

## 2014-03-04 LAB — CBC
HEMATOCRIT: 25.1 % — AB (ref 36.0–46.0)
HEMOGLOBIN: 8.5 g/dL — AB (ref 12.0–15.0)
MCH: 31.6 pg (ref 26.0–34.0)
MCHC: 33.9 g/dL (ref 30.0–36.0)
MCV: 93.3 fL (ref 78.0–100.0)
Platelets: 252 10*3/uL (ref 150–400)
RBC: 2.69 MIL/uL — AB (ref 3.87–5.11)
RDW: 14.1 % (ref 11.5–15.5)
WBC: 6.6 10*3/uL (ref 4.0–10.5)

## 2014-03-04 LAB — BASIC METABOLIC PANEL
Anion gap: 12 (ref 5–15)
BUN: 53 mg/dL — AB (ref 6–23)
CALCIUM: 8.6 mg/dL (ref 8.4–10.5)
CHLORIDE: 97 meq/L (ref 96–112)
CO2: 23 meq/L (ref 19–32)
Creatinine, Ser: 1.74 mg/dL — ABNORMAL HIGH (ref 0.50–1.10)
GFR, EST AFRICAN AMERICAN: 30 mL/min — AB (ref >90)
GFR, EST NON AFRICAN AMERICAN: 26 mL/min — AB (ref >90)
Glucose, Bld: 102 mg/dL — ABNORMAL HIGH (ref 70–99)
Potassium: 5.8 mEq/L — ABNORMAL HIGH (ref 3.7–5.3)
Sodium: 132 mEq/L — ABNORMAL LOW (ref 137–147)

## 2014-03-04 LAB — COMPREHENSIVE METABOLIC PANEL
ALBUMIN: 1.8 g/dL — AB (ref 3.5–5.2)
ALT: 20 U/L (ref 0–35)
AST: 37 U/L (ref 0–37)
Alkaline Phosphatase: 108 U/L (ref 39–117)
Anion gap: 13 (ref 5–15)
BUN: 49 mg/dL — ABNORMAL HIGH (ref 6–23)
CALCIUM: 8.1 mg/dL — AB (ref 8.4–10.5)
CO2: 23 mEq/L (ref 19–32)
CREATININE: 1.69 mg/dL — AB (ref 0.50–1.10)
Chloride: 99 mEq/L (ref 96–112)
GFR calc Af Amer: 31 mL/min — ABNORMAL LOW (ref >90)
GFR, EST NON AFRICAN AMERICAN: 26 mL/min — AB (ref >90)
Glucose, Bld: 121 mg/dL — ABNORMAL HIGH (ref 70–99)
Potassium: 4.9 mEq/L (ref 3.7–5.3)
Sodium: 135 mEq/L — ABNORMAL LOW (ref 137–147)
Total Bilirubin: 0.7 mg/dL (ref 0.3–1.2)
Total Protein: 5.5 g/dL — ABNORMAL LOW (ref 6.0–8.3)

## 2014-03-04 LAB — TSH: TSH: 1.98 u[IU]/mL (ref 0.350–4.500)

## 2014-03-04 MED ORDER — VANCOMYCIN HCL 500 MG IV SOLR
500.0000 mg | INTRAVENOUS | Status: DC
  Administered 2014-03-04 – 2014-03-06 (×3): 500 mg via INTRAVENOUS
  Filled 2014-03-04 (×3): qty 500

## 2014-03-04 MED ORDER — ENSURE COMPLETE PO LIQD
237.0000 mL | ORAL | Status: DC
  Administered 2014-03-04 – 2014-03-06 (×3): 237 mL via ORAL

## 2014-03-04 MED ORDER — DEXAMETHASONE SODIUM PHOSPHATE 4 MG/ML IJ SOLN
4.0000 mg | Freq: Four times a day (QID) | INTRAMUSCULAR | Status: DC
  Administered 2014-03-04 – 2014-03-11 (×28): 4 mg via INTRAVENOUS
  Filled 2014-03-04 (×32): qty 1

## 2014-03-04 MED ORDER — SODIUM CHLORIDE 0.9 % IV SOLN
INTRAVENOUS | Status: AC
  Administered 2014-03-04: 18:00:00 via INTRAVENOUS

## 2014-03-04 MED ORDER — SODIUM CHLORIDE 0.9 % IV SOLN: 750.0000 mg | INTRAVENOUS | Status: DC

## 2014-03-04 MED ORDER — PIPERACILLIN-TAZOBACTAM 3.375 G IVPB
3.3750 g | Freq: Three times a day (TID) | INTRAVENOUS | Status: DC
  Administered 2014-03-04 – 2014-03-07 (×9): 3.375 g via INTRAVENOUS
  Filled 2014-03-04 (×11): qty 50

## 2014-03-04 NOTE — Care Management Note (Addendum)
Page 1 of 1   03/07/2014     4:06:37 PM CARE MANAGEMENT NOTE 03/07/2014  Patient:  Jamie Munoz, Jamie Munoz   Account Number:  [id number]  Date Initiated:  03/04/2014  Documentation initiated by:  Dessa Phi  Subjective/Objective Assessment:   80 Y/O F ADMITTED W/ARF,AMS.HX:MET LUNG CA,METS [place].     Action/Plan:   FROM HOME.ACTIVE W/GENTIVA, & PCS-HELPING HANDS.   Anticipated DC Date:  03/08/2014   Anticipated DC Plan:  [place]  CM consult      [facility] Choice  Resumption Of Svcs/PTA Provider   Choice offered to / List presented to:             Status of service:  In process, will continue to follow Medicare Important Message given?  YES (If response is "NO", the following Medicare IM given date fields will be blank) Date Medicare IM given:  03/07/2014 Medicare IM given by:  [facility] Date Additional Medicare IM given:   Additional Medicare IM given by:    Discharge Disposition:    Per UR Regulation:  Reviewed for med. necessity/level of care/duration of stay  If discussed at [place] of Stay Meetings, dates discussed:    Comments:  03/07/14 Yamili Lichtenwalner RN,BSN NCM [phone] PT ORDERED.AWAIT RECOMMENDATIONS.  03/05/14 Dawne Casali RN,BSN NCM [place] SEE PATIENT.AWAIT RECOMMENDATIONS.  03/04/14 Laquinton Bihm RN,BSN NCM [phone] TC GENTIVA REP DEBBIE AWARE, & FOLLOWING.AWAIT FINAL HHC ORDERS.NEW BRAIN METS.XRT ONC CONS.WOULD RECOMMEND PT CONS WHEN APPROPRIATE.

## 2014-03-04 NOTE — Progress Notes (Signed)
OT Cancellation Note  Patient Details Name: Jamie Munoz MRN: [id number] DOB: Oct 02, 1926   Cancelled Treatment:    Reason Eval/Treat Not Completed: Other (comment).  Noted pt has L3 severe pathological fx.  Text paged MD.  Will check back.  Mazy Culton 03/04/2014, 3:09 PM Lesle Chris, OTR/L [phone] 03/04/2014

## 2014-03-04 NOTE — Progress Notes (Signed)
[facility] PROGRESS NOTE  Jamie Munoz HBZ:[id number] DOB: [birth year] DOA: 03/03/2014 PCP: [name], MD  Assessment/Plan: 1. Altered mental status- somewhat improved after starting on Decadron, MRI brain shows brain metastasis. Radiation oncology has been consulted by Dr. Julien Nordmann for radiation.  2. Chronic pain- patient is currently on OxyContin 10 mg twice a day at along with oxycodone 5 mg every 4 hours when necessary. 3. Brain metastasis- as above MRI brain shows metathesis, continue Decadron 4 mg IV every 6 hours. 4. ? Healthcare associated pneumonia- patient started on vancomycin and Zosyn, consider narrowing down the antibiotics in one to 2 days if patient remains stable. 5. ? UTI- patient has chronic indwelling Foley catheter, urine culture has been obtained. Continue antibiotics and follow the urine culture results. 6. Non-small cell lung cancer- patient has been followed by oncology as outpatient, radiation oncology consulted for possible radiation to the brain. Discussed with Dr Inda Merlin. 7. Acute on chronic kidney disease- patient's present creatinine is around 1.3, crit creatinine is 1.69. We'll continue gentle IV hydration with normal saline at 50 mL per hour and check BMP in a.m. 8. Chronic anemia- secondary to anemia of chronic disease, hemoglobin is 8.5 today. Follow CBC in a.m. 9. DVT prophylaxis- heparin  Code Status: DO NOT RESUSCITATE Family Communication: Discussed with patient's nephew at bedside Disposition Plan: To be decided   Consultants:  Oncology  Radiation oncology  Procedures:  *None  Antibiotics: Vancomycin 9/14 Zosyn 9/14  HPI/Subjective: 80 y.o. 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 Patient is accompanied by her nephews. Patient was originally diagnosed  with lung cancer about 9 years ago. She has been on Tarceva for many years. Recently, her cancer has mutated. No alternative treatment is available currently. Over the last few months patient has had a decline. Imaging studies done in June, showed worsening in her lung mass with adrenal metastases. At that time MRI showed metastatic process in the spine. She hasn't received any radiation treatment. She was seen by her oncologist recently and plan was to repeat the MRI and to consider referral to radiation oncology. However, patient could not tolerate the MRI and one is planned for October with sedation. According to the family patient has been having worsening confusion over the last few weeks. She's had worsening ability to walk in the last week to 2 weeks. Her oral intake has been poor. She's also had a cough for the last 2 days with mucus expectoration. They have also noticed some blood in the sputum. She has found it difficult to swallow food. No nausea, vomiting, no abdominal pain. She's lost about 30 lbs in last 6 months. She also is complaining of pain in her right chest area since this morning. Patient was not able to provide any history due to her confusion.    Today patient seems little better, patient says the pain is better controlled. Denies shortness of breath. Nephew at bedside   Objective: Filed Vitals:   03/04/14 1351  BP: 97/41  Pulse: 82  Temp: 98 F (36.7 C)  Resp: 16    Intake/Output Summary (Last 24 hours) at 03/04/14 1647 Last data filed at 03/04/14 1351  Gross per 24 hour  Intake   1998 ml  Output    700 ml  Net   1298 ml   [employer]  03/03/14 1900  Weight: 53.978 kg (119 lb)    Exam:  Physical Exam: Head: Normocephalic, atraumatic.  Lungs: Normal respiratory effort. B/L Clear to auscultation, no crackles or wheezes.  Heart: Regular RR. S1 and S2 normal  Abdomen: BS normoactive. Soft, Nondistended, non-tender.  Extremities: No pretibial edema, no  erythema   Data Reviewed: Basic Metabolic Panel:  Recent Labs Lab 03/03/14 1243 03/03/14 2340 03/04/14 0416  NA 131* 132* 135*  K 5.5* 5.8* 4.9  CL 92* 97 99  CO2 25 23 23   GLUCOSE 111* 102* 121*  BUN 57* 53* 49*  CREATININE 1.91* 1.74* 1.69*  CALCIUM 9.2 8.6 8.1*   Liver Function Tests:  Recent Labs Lab 03/03/14 1243 03/04/14 0416  AST 40* 37  ALT 21 20  ALKPHOS 118* 108  BILITOT 0.9 0.7  PROT 6.0 5.5*  ALBUMIN 2.2* 1.8*    Recent Labs Lab 03/03/14 1243  LIPASE 16   No results found for this basename: AMMONIA,  in the last 168 hours CBC:  Recent Labs Lab 03/03/14 1243 03/04/14 0416  WBC 7.7 6.6  NEUTROABS 6.4  --   HGB 9.2* 8.5*  HCT 27.3* 25.1*  MCV 93.2 93.3  PLT 278 252   Cardiac Enzymes: No results found for this basename: CKTOTAL, CKMB, CKMBINDEX, TROPONINI,  in the last 168 hours BNP (last 3 results) No results found for this basename: PROBNP,  in the last 8760 hours CBG: No results found for this basename: GLUCAP,  in the last 168 hours  No results found for this or any previous visit (from the past 240 hour(s)).   Studies: Mr Brain Wo Contrast  03/03/2014   CLINICAL DATA:  History of lung cancer.  Confusion.  EXAM: MRI HEAD WITHOUT CONTRAST  TECHNIQUE: Multiplanar, multiecho pulse sequences of the brain and surrounding structures were obtained without intravenous contrast. Per technologist no, patient was unable to remain still during the examination, no contrast media administered.  COMPARISON:  CT of the head January 06, 2010  FINDINGS: Moderately motion degraded examination.  Punctate foci of reduced diffusion in RIGHT inferior cerebellum, associated with minimal T2 hyperintense signal though motion limits assessment, concerning for metastasis.  2.5 x 3.7 cm low signal mass in LEFT temporal occipital lobe surrounding T2 bright vasogenic edema. T2 bright RIGHT inferior basal ganglia 10 mm ovoid lesion with surrounding vasogenic edema. 1.5 x 1.6  cm low signal mass and LEFT occipital gray-white matter junction with surrounding T2 bright vasogenic edema. 9 mm round T2 hypointense lesion in RIGHT frontal great white matter junction with small amount of surrounding vasogenic edema. Lesion showed reduced diffusion consistent with hypercellular disease. Punctate focus of susceptibility artifact associated of without high RIGHT frontal and LEFT occipital lobe lesions/metastasis. Local mass effect associated with all lesions, without significant midline shift. Partially effaced LEFT occipital horn, without hydrocephalus.  No abnormal extra-axial fluid collections. Normal major intracranial vascular flow voids seen at the skull base.  Status post bilateral ocular lens implants. Patchy areas of low T1 signal in the calvarium concerning for metastatic involvement. No abnormal sellar expansion. No cerebellar tonsillar ectopia.  IMPRESSION: Limited noncontrast MRI of the brain, further degraded by patient motion.  At least 4 supratentorial metastasis, largest in LEFT temporal occipital lobe measuring 2.5 x 3.7 cm. Local mass effect without significant midline shift.  Tiny RIGHT inferior cerebellum acute infarct versus metastasis.  Abnormal calvarial bone marrow signal concerning for osseous metastasis.   Electronically Signed   By: Elon Alas  On: 03/03/2014 23:05   Mr Thoracic Spine Wo Contrast  03/04/2014   CLINICAL DATA:  Assess for metastasis.  EXAM: MRI THORACIC AND LUMBAR SPINE WITHOUT CONTRAST  TECHNIQUE: Multiplanar and multiecho pulse sequences of the thoracic and lumbar spine were obtained without intravenous contrast. Patient was unable to remains filled, and contrast was not administered.  COMPARISON:  CT of the chest, abdomen and pelvis November 25, 2013  FINDINGS: MR THORACIC SPINE FINDINGS  Mildly motion degraded examination.  Thoracic vertebral bodies appear intact and aligned and maintenance of thoracic kyphosis. Low T1 and bright T2 lesion in  vertebral body T2 suggests a hemangioma. On localizer, low T1 signal within the C5 vertebra. Intervertebral discs demonstrate normal morphology, slightly decreased T2 signal within all discs most consistent with mild desiccation. No STIR signal abnormality with exception of the T2 lesion.  Motion degrades assessment for a cervical spinal cord signal abnormality, though there is no syrinx. Included view of the lungs demonstrates a large RIGHT upper lobe mass and large RIGHT pleural effusion.  No canal stenosis or neural foraminal narrowing at any thoracic level.  MR LUMBAR SPINE FINDINGS  Expansile low T1, bright T2 lesion in L3, extending into the RIGHT pedicle with pathologic severe fracture predominantly on the RIGHT. In addition, low T1 signal with mild bright T2 signal within the LEFT aspect of L2 consistent with metastasis with small RIGHT L2 vertebral body lesion. Small L1 hemangioma.  No malalignment. Moderate to severe remote L2-3 through all L5-S1 disc degeneration. Conus medullaris terminates at L1 and appears normal in morphology and signal characteristics. Cauda equina is unremarkable. Moderate to severe paraspinal muscle atrophy. Partially characterized retroperitoneal lymphadenopathy. Low T1, bright T2 signal small lesions in the liver could reflect metastasis.  Expansile low T1, bright STIR signal in the included sacrum seen only on sagittal sequences consistent with metastatic disease.  Level by level evaluation:  T12-L1: Annular bulging, no canal stenosis or neural foraminal narrowing.  L1-2 No disc bulge, canal stenosis nor neural foraminal narrowing.  L2-3: Approximate 4 mm retropulsed bony fragments. Mild to moderate canal stenosis. No neural significant neural foraminal narrowing.  L3-4: Minimal retropulsed bony fragments and disc material asymmetric to the LEFT. Mild facet arthropathy and ligamentum flavum redundancy. No canal stenosis. Mild LEFT neural foraminal narrowing.  L4-5: Small  broad-based disc bulge there are mild facet arthropathy and ligamentum flavum redundancy without canal stenosis. Minimal RIGHT neural foraminal narrowing.  L5-S1: Small broad-based disc bulge. Moderate to severe facet arthropathy. No canal stenosis. Mild LEFT neural foraminal narrowing.  IMPRESSION: MR THORACIC SPINE IMPRESSION  Limited noncontrast examination further degraded by motion.  No convincing evidence of thoracic spine osseous metastasis ; no pathologic fracture or malalignment. Probable hemangioma T2.  Possible C5 metastasis seen on the localizer.  Large RIGHT upper lobe mass and a large RIGHT pleural effusion partially imaged.  MR LUMBAR SPINE IMPRESSION  Limited noncontrast examination.  Severe L3 pathologic fracture with underlying metastasis. Additional L2 metastasis without fracture. Partially imaged sacral metastasis.  Mild to moderate canal stenosis at L2-3. Minimal to mild L3-4 through L5-S1 neural foraminal narrowing.   Electronically Signed   By: Elon Alas   On: 03/04/2014 02:56   Mr Lumbar Spine Wo Contrast  03/04/2014   CLINICAL DATA:  Assess for metastasis.  EXAM: MRI THORACIC AND LUMBAR SPINE WITHOUT CONTRAST  TECHNIQUE: Multiplanar and multiecho pulse sequences of the thoracic and lumbar spine were obtained without intravenous contrast. Patient was unable to remains filled, and contrast  was not administered.  COMPARISON:  CT of the chest, abdomen and pelvis November 25, 2013  FINDINGS: MR THORACIC SPINE FINDINGS  Mildly motion degraded examination.  Thoracic vertebral bodies appear intact and aligned and maintenance of thoracic kyphosis. Low T1 and bright T2 lesion in vertebral body T2 suggests a hemangioma. On localizer, low T1 signal within the C5 vertebra. Intervertebral discs demonstrate normal morphology, slightly decreased T2 signal within all discs most consistent with mild desiccation. No STIR signal abnormality with exception of the T2 lesion.  Motion degrades assessment for  a cervical spinal cord signal abnormality, though there is no syrinx. Included view of the lungs demonstrates a large RIGHT upper lobe mass and large RIGHT pleural effusion.  No canal stenosis or neural foraminal narrowing at any thoracic level.  MR LUMBAR SPINE FINDINGS  Expansile low T1, bright T2 lesion in L3, extending into the RIGHT pedicle with pathologic severe fracture predominantly on the RIGHT. In addition, low T1 signal with mild bright T2 signal within the LEFT aspect of L2 consistent with metastasis with small RIGHT L2 vertebral body lesion. Small L1 hemangioma.  No malalignment. Moderate to severe remote L2-3 through all L5-S1 disc degeneration. Conus medullaris terminates at L1 and appears normal in morphology and signal characteristics. Cauda equina is unremarkable. Moderate to severe paraspinal muscle atrophy. Partially characterized retroperitoneal lymphadenopathy. Low T1, bright T2 signal small lesions in the liver could reflect metastasis.  Expansile low T1, bright STIR signal in the included sacrum seen only on sagittal sequences consistent with metastatic disease.  Level by level evaluation:  T12-L1: Annular bulging, no canal stenosis or neural foraminal narrowing.  L1-2 No disc bulge, canal stenosis nor neural foraminal narrowing.  L2-3: Approximate 4 mm retropulsed bony fragments. Mild to moderate canal stenosis. No neural significant neural foraminal narrowing.  L3-4: Minimal retropulsed bony fragments and disc material asymmetric to the LEFT. Mild facet arthropathy and ligamentum flavum redundancy. No canal stenosis. Mild LEFT neural foraminal narrowing.  L4-5: Small broad-based disc bulge there are mild facet arthropathy and ligamentum flavum redundancy without canal stenosis. Minimal RIGHT neural foraminal narrowing.  L5-S1: Small broad-based disc bulge. Moderate to severe facet arthropathy. No canal stenosis. Mild LEFT neural foraminal narrowing.  IMPRESSION: MR THORACIC SPINE IMPRESSION   Limited noncontrast examination further degraded by motion.  No convincing evidence of thoracic spine osseous metastasis ; no pathologic fracture or malalignment. Probable hemangioma T2.  Possible C5 metastasis seen on the localizer.  Large RIGHT upper lobe mass and a large RIGHT pleural effusion partially imaged.  MR LUMBAR SPINE IMPRESSION  Limited noncontrast examination.  Severe L3 pathologic fracture with underlying metastasis. Additional L2 metastasis without fracture. Partially imaged sacral metastasis.  Mild to moderate canal stenosis at L2-3. Minimal to mild L3-4 through L5-S1 neural foraminal narrowing.   Electronically Signed   By: Elon Alas   On: 03/04/2014 02:56   Dg Abd Acute W/chest  03/03/2014   CLINICAL DATA:  Chest and abdominal pain.  EXAM: ACUTE ABDOMEN SERIES (ABDOMEN 2 VIEW & CHEST 1 VIEW)  COMPARISON:  December 02, 2013.  FINDINGS: Stable cardiac silhouette. Right internal jugular Port-A-Cath is again noted with distal tip in expected position of the SVC. Interval development of moderate right pleural effusion with probable underlying atelectasis. Right upper lobe opacity is noted concerning for atelectasis or pneumonia. No pneumothorax is noted. Left lung is clear. Stool is noted throughout the colon. There is no evidence of bowel obstruction or ileus. Multilevel degenerative disc disease is noted  in the lumbar spine.  IMPRESSION: No evidence of bowel obstruction or ileus. Stool is noted throughout the colon which may represent constipation. Interval development of moderate right pleural effusion with probable underlying atelectasis. Increased right upper lobe opacity is noted concerning for pneumonia or atelectasis.   Electronically Signed   By: Sabino Dick M.D.   On: 03/03/2014 13:37    Scheduled Meds: . dexamethasone  4 mg Intravenous 4 times per day  . docusate sodium  100 mg Oral BID  . feeding supplement (ENSURE COMPLETE)  237 mL Oral Q24H  . heparin  5,000 Units  Subcutaneous 3 times per day  . OxyCODONE  10 mg Oral Q12H  . piperacillin-tazobactam (ZOSYN)  IV  3.375 g Intravenous Q8H  . polyethylene glycol  17 g Oral BID  . sodium chloride  3 mL Intravenous Q12H  . vancomycin  500 mg Intravenous Q24H   Continuous Infusions:   Principal Problem:   ARF (acute renal failure) Active Problems:   Lung cancer   Non-small cell carcinoma of lung   Dehydration   Acute encephalopathy   Pleural effusion on right   Pneumonia   Hyperkalemia   Chronic indwelling Foley catheter   UTI (lower urinary tract infection)   Normocytic anemia    Time spent: 25 minutes    [facility] Pager [phone] If 7PM-7AM, please contact night-coverage at [email or web address], password [facility] 03/04/2014, 4:47 PM  LOS: 1 day

## 2014-03-04 NOTE — Progress Notes (Signed)
Tresa Endo PT [phone]

## 2014-03-04 NOTE — Plan of Care (Signed)
Problem: Phase I Progression Outcomes Goal: Voiding-avoid urinary catheter unless indicated Outcome: Not Met (add Reason) Chronic foley, was last changed on 02/16/14

## 2014-03-04 NOTE — ED Provider Notes (Signed)
Medical screening examination/treatment/procedure(s) were conducted as a shared visit with non-physician practitioner(s) and myself.  I personally evaluated the patient during the encounter.   EKG Interpretation   Date/Time:  Monday March 03 2014 13:54:15 EDT Ventricular Rate:  97 PR Interval:  152 QRS Duration: 86 QT Interval:  325 QTC Calculation: 413 R Axis:   -34 Text Interpretation:  Sinus rhythm Probable left ventricular hypertrophy  Nonspecific T abnrm, anterolateral leads Confirmed by Zenia Resides  MD, Maziah Smola  ([id number]) on 03/03/2014 2:34:46 PM       Leota Jacobsen, MD 03/04/14 1620

## 2014-03-04 NOTE — Progress Notes (Signed)
INITIAL NUTRITION ASSESSMENT  DOCUMENTATION CODES Per approved criteria  -Severe malnutrition in the context of chronic illness  Pt meets criteria for severe MALNUTRITION in the context of chronic illness as evidenced by 8% body weight loss in < 3 months, PO intake <75% for one month.   INTERVENTION: -Recommend Ensure Complete once daily -Recommend high protein/kcal snacks-provided pt with apple slices and peanut butter to consume as tolerated -Provided pt with nutriton supplement coupons -Encouraged PO intake -Will continue to monitor  NUTRITION DIAGNOSIS: Inadequate oral intake related to decreased appetite as evidenced by weight loss, PO intake < 75%.   Goal: Pt to meet >/= 90% of their estimated nutrition needs    Monitor:  Total protein/energy intake, labs, weights  Reason for Assessment: MST  80 y.o. female  Admitting Dx: ARF (acute renal failure)  ASSESSMENT: Jamie Munoz is a 80 y.o. 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 Patient is accompanied by her nephews. Patient was originally diagnosed with lung cancer about 9 years ago. She has been on Tarceva for many years. Recently, her cancer has mutated. No alternative treatment is available currently. Over the last few months patient has had a decline  -Pt endorsed ongoing weight loss, has lost approximately 30 lbs in 6 months; and has lost 10 lbs in past one month (8% body weight loss, severe for time frame) -Diet recall indicated pt consuming one full meal daily- usually breakfast consisting of egg/toast/bacon. Lunch consists of soup, and pt will  consume snack for dinner, typically fruit or trail mix. Will drink one Ensure Complete daily -Pt has lost taste for sweets and foods she used to enjoy -Encouraged intake of protein foods-apples and  peanut butter, pinto beans, milk, yogurt, cottage cheese. Recommend pt try to increase supplement to BID as tolerated, and add protein foods at each meal -Current PO intake is <50% of soups, and 1-2 bites of pudding -Provided pt snack and supplement coupons. Family noted pt eats better when she has food on hand, as opposed to offering snacks/supplements -SLP eval on 9/15 recommended regular diet textures  Height: Ht Readings from Last 1 Encounters:  03/03/14 5\' 6"  (1.676 m)    Weight: Wt Readings from Last 1 Encounters:  03/03/14 119 lb (53.978 kg)    Ideal Body Weight: 130 lbs  % Ideal Body Weight: 92%  Wt Readings from Last 10 Encounters:  03/03/14 119 lb (53.978 kg)  02/20/14 119 lb 8 oz (54.205 kg)  01/22/14 124 lb 12.8 oz (56.609 kg)  12/25/13 130 lb 1.6 oz (59.013 kg)  12/02/13 125 lb 7 oz (56.898 kg)  11/27/13 125 lb 11.2 oz (57.017 kg)  11/25/13 130 lb (58.968 kg)  11/01/13 130 lb 11.2 oz (59.285 kg)  08/22/13 131 lb 14.4 oz (59.829 kg)  07/25/13 132 lb 8 oz (60.102 kg)    Usual Body Weight: 130 lbs  % Usual Body Weight: 92%  BMI:  Body mass index is 19.22 kg/(m^2).  Estimated Nutritional Needs: Kcal: 1700-1900 Protein: 70-80 gram Fluid: >/=1700 ml/daily  Skin: stage 2 PU sacrum  Diet Order: General  EDUCATION NEEDS: -Education needs addressed   Intake/Output Summary (Last 24 hours) at 03/04/14 1437 Last data filed at 03/04/14 1351  Gross per 24 hour  Intake   1998 ml  Output    700 ml  Net   1298 ml    Last BM: 9/14   Labs:   Recent Labs Lab 03/03/14 1243 03/03/14 2340 03/04/14 0416  NA 131* 132* 135*  K 5.5* 5.8* 4.9  CL 92* 97 99  CO2 25 23 23   BUN 57* 53* 49*  CREATININE 1.91* 1.74* 1.69*  CALCIUM 9.2 8.6 8.1*  GLUCOSE 111* 102* 121*    CBG (last 3)  No results found for this basename: GLUCAP,  in the last 72 hours  Scheduled Meds: . dexamethasone  4 mg Intravenous 4 times per day  . docusate sodium  100 mg Oral BID  .  feeding supplement (ENSURE COMPLETE)  237 mL Oral Q24H  . heparin  5,000 Units Subcutaneous 3 times per day  . OxyCODONE  10 mg Oral Q12H  . piperacillin-tazobactam (ZOSYN)  IV  3.375 g Intravenous Q8H  . polyethylene glycol  17 g Oral BID  . sodium chloride  3 mL Intravenous Q12H  . vancomycin  500 mg Intravenous Q24H    Continuous Infusions:   Past Medical History  Diagnosis Date  . Cancer   . Lung cancer   . Hypertension   . Cellulitis of left lower extremity 07/16/2012    Past Surgical History  Procedure Laterality Date  . Cholecystectomy    . Abdominal hysterectomy    . Breast surgery    . Vaginal sling      Atlee Abide MS RD LDN Clinical Dietitian PNTIR:[phone]

## 2014-03-04 NOTE — Progress Notes (Signed)
ANTIBIOTIC CONSULT NOTE - FOLLOW UP  Pharmacy Consult for Vancomycin and Zosyn Indication: pneumonia  Allergies  Allergen Reactions  . Codeine Shortness Of Breath    Patient Measurements: Height: 5\' 6"  (167.6 cm) Weight: 119 lb (53.978 kg) IBW/kg (Calculated) : 59.3  Vital Signs: Temp: 98.5 F (36.9 C) (09/15 0520) Temp src: Oral (09/15 0520) BP: 110/41 mmHg (09/15 0520) Pulse Rate: 100 (09/15 0520) Intake/Output from previous day: 09/14 0701 - 09/15 0700 In: 1610 [P.O.:240; I.V.:908; IV Piggyback:250] Out: 400 [Urine:400] Intake/Output from this shift:    Labs:  Recent Labs  03/03/14 1243 03/03/14 2340 03/04/14 0416  WBC 7.7  --  6.6  HGB 9.2*  --  8.5*  PLT 278  --  252  CREATININE 1.91* 1.74* 1.69*   Estimated Creatinine Clearance: 20.7 ml/min (by C-G formula based on Cr of 1.69). No results found for this basename: Letta Median, VANCORANDOM, [place], GENTPEAK, GENTRANDOM, TOBRATROUGH, TOBRAPEAK, TOBRARND, AMIKACINPEAK, AMIKACINTROU, AMIKACIN,  in the last 72 hours   Microbiology: Recent Results (from the past 720 hour(s))  URINE CULTURE     Status: None   Collection Time    02/02/14  9:41 AM      Result Value Ref Range Status   Specimen Description URINE, CATHETERIZED   Final   Special Requests Normal   Final   Culture  Setup Time     Final   Value: 02/02/2014 20:43     Performed at [facility]     Final   Value: >=100,000 COLONIES/ML     Performed at [facility]   Culture     Final   Value: PROTEUS MIRABILIS     Performed at [facility]   Report Status 02/04/2014 FINAL   Final   Organism ID, Bacteria PROTEUS MIRABILIS   Final  URINE CULTURE     Status: None   Collection Time    02/10/14 11:50 PM      Result Value Ref Range Status   Specimen Description URINE, CATHETERIZED   Final   Special Requests NONE   Final   Culture  Setup Time     Final   Value: 02/11/2014 08:44     Performed at  [facility]     Final   Value: 45,000 COLONIES/ML     Performed at [facility]   Culture     Final   Value: Multiple bacterial morphotypes present, none predominant. Suggest appropriate recollection if clinically indicated.     Performed at [facility]   Report Status 02/13/2014 FINAL   Final  URINE CULTURE     Status: None   Collection Time    02/16/14  4:54 PM      Result Value Ref Range Status   Specimen Description URINE, CATHETERIZED   Final   Special Requests NONE   Final   Culture  Setup Time     Final   Value: 02/17/2014 02:46     Performed at [facility]     Final   Value: >=100,000 COLONIES/ML     Performed at [facility]   Culture     Final   Value: [place]     Performed at [facility]   Report Status 02/20/2014 FINAL   Final   Organism ID, Bacteria KLEBSIELLA PNEUMONIAE   Final   Organism ID, Bacteria  PROTEUS MIRABILIS   Final    Assessment: 80 yo F with history of Lung CA (~35yr). Admitted with worsening pain, weakness, difficulty walking, dehydrated, poor po intake. CXR on admission shows RUL opacity concerning for PNA, aspiration. Pharmacy consulted to dose Vancomycin and Zosyn.  9/14 >> Levaquin x 1 9/14 >>Zosyn >> 9/14 >>Vanc >>   Tmax: Afebrile WBCs: 6.6k Renal: SCr down 1.69, CrCl 20 ml/min (CG), 27 ml/min (N)  Previous urine cx 8/30:  --Klebsiella (R-amp, S-all others) --Proteus (R-cipro, nitro; S-amp, cephs, AG, zosyn, bactrim; I-levo)  9/15 urine: ordered (UA neg)   Goal of Therapy:  Vancomycin trough level 15-20 mcg/ml Zosyn dose per renal functon  Plan:   Adjust Zosyn to 3.375gm IV q8h (4hr extended infusions)  Increase Vancomycin to 500mg  IV q24h Check trough at steady state Follow up renal function & cultures, clinical course  Peggyann Juba, PharmD, BCPS Pager: [phone] 03/04/2014,8:28 AM

## 2014-03-04 NOTE — Evaluation (Signed)
Clinical/Bedside Swallow Evaluation Patient Details  Name: Jamie Munoz MRN: [id number] Date of Birth: [birth year]  Today's Date: 03/04/2014 Time: 1205-1232 SLP Time Calculation (min): 27 min  Past Medical History:  Past Medical History  Diagnosis Date  . Cancer   . Lung cancer   . Hypertension   . Cellulitis of left lower extremity 07/16/2012   Past Surgical History:  Past Surgical History  Procedure Laterality Date  . Cholecystectomy    . Abdominal hysterectomy    . Breast surgery    . Vaginal sling     HPI:  80 yo female adm to [facility] with h/o metastatic lung cancer, cellulitis, HTN, cough x2 days prior to admit, weight loss of 30 pounds in six months.  Pt for swallow evaluation as she reported problems swallowing foods to MD per MD note.  Today pt denies issues with dysphagia except to pills - sensation of lodging in pharynx.     Assessment / Plan / Recommendation Clinical Impression  Pt presents with functional oropharyngeal swallow based on clinical evaluation.  No focal CN deficits.  Chronic throat clearing noted prior to and after intake - which pt states is baseline.  Observed pt consuming cracker and soda - slow but functional mastication without symptoms of dysphagia or airway compromise.  Pt admits to occasional issues swallowing pills - sensing lodging in pharynx - requring her to expectorate them at times.  Advised her to alternative means to consume medications - ie with pudding, applesauce, liquids, etc.  Family present and educated to strategies to mitigate dysphagia and increase comfort with intake.      Aspiration Risk  Mild    Diet Recommendation Regular;Thin liquid   Liquid Administration via: Cup;Straw Medication Administration: Whole meds with puree (start and follow with liquids) Supervision: Patient able to self feed Compensations: Slow rate;Small sips/bites    Other  Recommendations Oral Care Recommendations: Oral care BID   Follow Up Recommendations  n/a   Frequency and Duration        Pertinent Vitals/Pain Afebrile, decreased     Swallow Study Prior Functional Status   pt with progressive weight loss, poor appetite and intake per family prior to admission    General Date of Onset: 03/04/14 HPI: 80 yo female adm to [facility] with h/o metastatic lung cancer, cellulitis, HTN, cough x2 days prior to admit, weight loss of 30 pounds in six months.  Pt for swallow evaluation as she reported problems swallowing foods to MD per MD note.  Today pt denies issues with dysphagia except to pills - sensation of lodging in pharynx.   Type of Study: Bedside swallow evaluation Diet Prior to this Study: Dysphagia 1 (puree);Thin liquids Temperature Spikes Noted: No Respiratory Status: Room air History of Recent Intubation: No Behavior/Cognition: Alert;Cooperative;Pleasant mood Oral Cavity - Dentition: Adequate natural dentition Self-Feeding Abilities: Able to feed self Patient Positioning: Upright in bed Baseline Vocal Quality: Hoarse;Low vocal intensity Volitional Cough: Strong Volitional Swallow: Able to elicit    Oral/Motor/Sensory Function Overall Oral Motor/Sensory Function: Appears within functional limits for tasks assessed   Ice Chips Ice chips: Not tested   Thin Liquid Thin Liquid: Within functional limits Presentation: Straw    Nectar Thick Nectar Thick Liquid: Not tested   Honey Thick Honey Thick Liquid: Not tested   Puree Puree: Within functional limits Presentation: Self Fed;Spoon   Solid   GO    Solid: Within functional limits Presentation: Self Fed Other Comments: slow but effective mastication  Jamie Munoz, [facility] SLP [phone]

## 2014-03-04 NOTE — Evaluation (Signed)
SLP Cancellation Note  Patient Details Name: Jamie Munoz MRN: [id number] DOB: 1927/03/02   Cancelled treatment:       Reason Eval/Treat Not Completed: Other (comment) (pt working with IV team currently, will continue efforts)   Luanna Salk, [facility] SLP [phone]

## 2014-03-04 NOTE — Progress Notes (Signed)
DIAGNOSIS: Metastatic non-small cell lung cancer (T2 NX M1). Presented with right upper lobe lung mass as well as multiple pulmonary nodules and bone metastasis at L3 vertebral body. This was diagnosed in March of 2006.   Genomic Alterations Identified?  EGFR [id number], T790M  LPFX9K/W loss  NOTCH1 splice site 4097-3Z>H  RICTOR amplification  TP53 Y234C  BCL2L2 amplification  FGF10 amplification - equivocal?  NFKBIA amplification  NKX2-1 amplification  PAX5 loss  Additional Disease-relevant Genes with No Reportable  Alterations Identified?  ERBB2  ALK  BRAF  RET  KRAS  MET   PRIOR THERAPY:  1. Status post palliative radiotherapy to the L3 lesion under the care of Dr. Tammi Klippel completed September 17, 2004. 2. Status post 6 cycles of systemic chemotherapy with carboplatin and paclitaxel. Last dose was given January 01, 2005. 3. Status post palliative radiotherapy to the right upper lobe lung mass under the care of Dr. Tammi Klippel completed on 12/29/2010. 4. Status post treatment with Tarceva 150 mg p.o. daily started April 2007 through July 2012. 5. Tarceva 100 mg p.o. daily started July 2012. 6. Systemic chemotherapy with Carboplatin for AUC 5 and Alimta 500 mg/m2 given every 3 weeks. Status post 6 cycles. 7. Maintenance chemotherapy with single agent Alimta at 500 mg per meter squared given every 3 weeks, status post 1 cycle given on 07/30/2012.  CURRENT THERAPY:  1. Zometa 4 mg IV q. 3 months for bone metastasis. 2. Tarceva 100 mg by mouth daily resumed therapy on 01/07/13  CHEMOTHERAPY INTENT: Palliative  CURRENT # OF CHEMOTHERAPY CYCLES: 14  CURRENT ANTIEMETICS: Compazine  CURRENT SMOKING STATUS: Never Smoker  ORAL CHEMOTHERAPY AND CONSENT: yes  CURRENT BISPHOSPHONATES USE: Zometa every 3 months  PAIN MANAGEMENT: OxyContin  NARCOTICS INDUCED CONSTIPATION: yes but well managed with Senokot-S and as needed Dulcolax  LIVING WILL AND CODE STATUS: Full code   Subjective: The  patient is seen and examined today. She was sleeping this morning. Her nephew was at the bedside.she was admitted yesterday to [facility] with complaint of pain on the right side of the chest as well as weakness and worsening confusion. She was scheduled for MRI of the brain as well as thoracolumbar spine on an outpatient basis but this required conscious sedation and was delayed until next month. After the admission last night the patient was able to get the MRI performed and it showed new brain metastases. She still confused that time and sleepy.  Objective: Vital signs in last 24 hours: Temp:  [98.1 F (36.7 C)-98.9 F (37.2 C)] 98.5 F (36.9 C) (09/15 0520) Pulse Rate:  [67-109] 100 (09/15 0520) Resp:  [14-22] 14 (09/15 0520) BP: (110-128)/(41-72) 110/41 mmHg (09/15 0520) SpO2:  [90 %-99 %] 97 % (09/15 0520) Weight:  [119 lb (53.978 kg)] 119 lb (53.978 kg) (09/14 1900)  Intake/Output from previous day: 09/14 0701 - 09/15 0700 In: 2992 [P.O.:240; I.V.:908; IV Piggyback:250] Out: 400 [Urine:400] Intake/Output this shift: Total I/O In: 360 [P.O.:360] Out: -   General appearance: fatigued and no distress Resp: clear to auscultation bilaterally Cardio: regular rate and rhythm, S1, S2 normal, no murmur, click, rub or gallop GI: soft, non-tender; bowel sounds normal; no masses,  no organomegaly Extremities: extremities normal, atraumatic, no cyanosis or edema  Lab Results:   Recent Labs  03/03/14 1243 03/04/14 0416  WBC 7.7 6.6  HGB 9.2* 8.5*  HCT 27.3* 25.1*  PLT 278 252   BMET  Recent Labs  03/03/14 2340 03/04/14 0416  NA 132*  135*  K 5.8* 4.9  CL 97 99  CO2 23 23  GLUCOSE 102* 121*  BUN 53* 49*  CREATININE 1.74* 1.69*  CALCIUM 8.6 8.1*    Studies/Results: Mr Brain Wo Contrast  03/03/2014   CLINICAL DATA:  History of lung cancer.  Confusion.  EXAM: MRI HEAD WITHOUT CONTRAST  TECHNIQUE: Multiplanar, multiecho pulse sequences of the brain and  surrounding structures were obtained without intravenous contrast. Per technologist no, patient was unable to remain still during the examination, no contrast media administered.  COMPARISON:  CT of the head January 06, 2010  FINDINGS: Moderately motion degraded examination.  Punctate foci of reduced diffusion in RIGHT inferior cerebellum, associated with minimal T2 hyperintense signal though motion limits assessment, concerning for metastasis.  2.5 x 3.7 cm low signal mass in LEFT temporal occipital lobe surrounding T2 bright vasogenic edema. T2 bright RIGHT inferior basal ganglia 10 mm ovoid lesion with surrounding vasogenic edema. 1.5 x 1.6 cm low signal mass and LEFT occipital gray-white matter junction with surrounding T2 bright vasogenic edema. 9 mm round T2 hypointense lesion in RIGHT frontal great white matter junction with small amount of surrounding vasogenic edema. Lesion showed reduced diffusion consistent with hypercellular disease. Punctate focus of susceptibility artifact associated of without high RIGHT frontal and LEFT occipital lobe lesions/metastasis. Local mass effect associated with all lesions, without significant midline shift. Partially effaced LEFT occipital horn, without hydrocephalus.  No abnormal extra-axial fluid collections. Normal major intracranial vascular flow voids seen at the skull base.  Status post bilateral ocular lens implants. Patchy areas of low T1 signal in the calvarium concerning for metastatic involvement. No abnormal sellar expansion. No cerebellar tonsillar ectopia.  IMPRESSION: Limited noncontrast MRI of the brain, further degraded by patient motion.  At least 4 supratentorial metastasis, largest in LEFT temporal occipital lobe measuring 2.5 x 3.7 cm. Local mass effect without significant midline shift.  Tiny RIGHT inferior cerebellum acute infarct versus metastasis.  Abnormal calvarial bone marrow signal concerning for osseous metastasis.   Electronically Signed   By:  Elon Alas   On: 03/03/2014 23:05   Mr Thoracic Spine Wo Contrast  03/04/2014   CLINICAL DATA:  Assess for metastasis.  EXAM: MRI THORACIC AND LUMBAR SPINE WITHOUT CONTRAST  TECHNIQUE: Multiplanar and multiecho pulse sequences of the thoracic and lumbar spine were obtained without intravenous contrast. Patient was unable to remains filled, and contrast was not administered.  COMPARISON:  CT of the chest, abdomen and pelvis November 25, 2013  FINDINGS: MR THORACIC SPINE FINDINGS  Mildly motion degraded examination.  Thoracic vertebral bodies appear intact and aligned and maintenance of thoracic kyphosis. Low T1 and bright T2 lesion in vertebral body T2 suggests a hemangioma. On localizer, low T1 signal within the C5 vertebra. Intervertebral discs demonstrate normal morphology, slightly decreased T2 signal within all discs most consistent with mild desiccation. No STIR signal abnormality with exception of the T2 lesion.  Motion degrades assessment for a cervical spinal cord signal abnormality, though there is no syrinx. Included view of the lungs demonstrates a large RIGHT upper lobe mass and large RIGHT pleural effusion.  No canal stenosis or neural foraminal narrowing at any thoracic level.  MR LUMBAR SPINE FINDINGS  Expansile low T1, bright T2 lesion in L3, extending into the RIGHT pedicle with pathologic severe fracture predominantly on the RIGHT. In addition, low T1 signal with mild bright T2 signal within the LEFT aspect of L2 consistent with metastasis with small RIGHT L2 vertebral body lesion. Small L1  hemangioma.  No malalignment. Moderate to severe remote L2-3 through all L5-S1 disc degeneration. Conus medullaris terminates at L1 and appears normal in morphology and signal characteristics. Cauda equina is unremarkable. Moderate to severe paraspinal muscle atrophy. Partially characterized retroperitoneal lymphadenopathy. Low T1, bright T2 signal small lesions in the liver could reflect metastasis.   Expansile low T1, bright STIR signal in the included sacrum seen only on sagittal sequences consistent with metastatic disease.  Level by level evaluation:  T12-L1: Annular bulging, no canal stenosis or neural foraminal narrowing.  L1-2 No disc bulge, canal stenosis nor neural foraminal narrowing.  L2-3: Approximate 4 mm retropulsed bony fragments. Mild to moderate canal stenosis. No neural significant neural foraminal narrowing.  L3-4: Minimal retropulsed bony fragments and disc material asymmetric to the LEFT. Mild facet arthropathy and ligamentum flavum redundancy. No canal stenosis. Mild LEFT neural foraminal narrowing.  L4-5: Small broad-based disc bulge there are mild facet arthropathy and ligamentum flavum redundancy without canal stenosis. Minimal RIGHT neural foraminal narrowing.  L5-S1: Small broad-based disc bulge. Moderate to severe facet arthropathy. No canal stenosis. Mild LEFT neural foraminal narrowing.  IMPRESSION: MR THORACIC SPINE IMPRESSION  Limited noncontrast examination further degraded by motion.  No convincing evidence of thoracic spine osseous metastasis ; no pathologic fracture or malalignment. Probable hemangioma T2.  Possible C5 metastasis seen on the localizer.  Large RIGHT upper lobe mass and a large RIGHT pleural effusion partially imaged.  MR LUMBAR SPINE IMPRESSION  Limited noncontrast examination.  Severe L3 pathologic fracture with underlying metastasis. Additional L2 metastasis without fracture. Partially imaged sacral metastasis.  Mild to moderate canal stenosis at L2-3. Minimal to mild L3-4 through L5-S1 neural foraminal narrowing.   Electronically Signed   By: Elon Alas   On: 03/04/2014 02:56   Mr Lumbar Spine Wo Contrast  03/04/2014   CLINICAL DATA:  Assess for metastasis.  EXAM: MRI THORACIC AND LUMBAR SPINE WITHOUT CONTRAST  TECHNIQUE: Multiplanar and multiecho pulse sequences of the thoracic and lumbar spine were obtained without intravenous contrast. Patient  was unable to remains filled, and contrast was not administered.  COMPARISON:  CT of the chest, abdomen and pelvis November 25, 2013  FINDINGS: MR THORACIC SPINE FINDINGS  Mildly motion degraded examination.  Thoracic vertebral bodies appear intact and aligned and maintenance of thoracic kyphosis. Low T1 and bright T2 lesion in vertebral body T2 suggests a hemangioma. On localizer, low T1 signal within the C5 vertebra. Intervertebral discs demonstrate normal morphology, slightly decreased T2 signal within all discs most consistent with mild desiccation. No STIR signal abnormality with exception of the T2 lesion.  Motion degrades assessment for a cervical spinal cord signal abnormality, though there is no syrinx. Included view of the lungs demonstrates a large RIGHT upper lobe mass and large RIGHT pleural effusion.  No canal stenosis or neural foraminal narrowing at any thoracic level.  MR LUMBAR SPINE FINDINGS  Expansile low T1, bright T2 lesion in L3, extending into the RIGHT pedicle with pathologic severe fracture predominantly on the RIGHT. In addition, low T1 signal with mild bright T2 signal within the LEFT aspect of L2 consistent with metastasis with small RIGHT L2 vertebral body lesion. Small L1 hemangioma.  No malalignment. Moderate to severe remote L2-3 through all L5-S1 disc degeneration. Conus medullaris terminates at L1 and appears normal in morphology and signal characteristics. Cauda equina is unremarkable. Moderate to severe paraspinal muscle atrophy. Partially characterized retroperitoneal lymphadenopathy. Low T1, bright T2 signal small lesions in the liver could reflect metastasis.  Expansile low T1, bright STIR signal in the included sacrum seen only on sagittal sequences consistent with metastatic disease.  Level by level evaluation:  T12-L1: Annular bulging, no canal stenosis or neural foraminal narrowing.  L1-2 No disc bulge, canal stenosis nor neural foraminal narrowing.  L2-3: Approximate 4 mm  retropulsed bony fragments. Mild to moderate canal stenosis. No neural significant neural foraminal narrowing.  L3-4: Minimal retropulsed bony fragments and disc material asymmetric to the LEFT. Mild facet arthropathy and ligamentum flavum redundancy. No canal stenosis. Mild LEFT neural foraminal narrowing.  L4-5: Small broad-based disc bulge there are mild facet arthropathy and ligamentum flavum redundancy without canal stenosis. Minimal RIGHT neural foraminal narrowing.  L5-S1: Small broad-based disc bulge. Moderate to severe facet arthropathy. No canal stenosis. Mild LEFT neural foraminal narrowing.  IMPRESSION: MR THORACIC SPINE IMPRESSION  Limited noncontrast examination further degraded by motion.  No convincing evidence of thoracic spine osseous metastasis ; no pathologic fracture or malalignment. Probable hemangioma T2.  Possible C5 metastasis seen on the localizer.  Large RIGHT upper lobe mass and a large RIGHT pleural effusion partially imaged.  MR LUMBAR SPINE IMPRESSION  Limited noncontrast examination.  Severe L3 pathologic fracture with underlying metastasis. Additional L2 metastasis without fracture. Partially imaged sacral metastasis.  Mild to moderate canal stenosis at L2-3. Minimal to mild L3-4 through L5-S1 neural foraminal narrowing.   Electronically Signed   By: Awilda Metro   On: 03/04/2014 02:56   Dg Abd Acute W/chest  03/03/2014   CLINICAL DATA:  Chest and abdominal pain.  EXAM: ACUTE ABDOMEN SERIES (ABDOMEN 2 VIEW & CHEST 1 VIEW)  COMPARISON:  December 02, 2013.  FINDINGS: Stable cardiac silhouette. Right internal jugular Port-A-Cath is again noted with distal tip in expected position of the SVC. Interval development of moderate right pleural effusion with probable underlying atelectasis. Right upper lobe opacity is noted concerning for atelectasis or pneumonia. No pneumothorax is noted. Left lung is clear. Stool is noted throughout the colon. There is no evidence of bowel obstruction or  ileus. Multilevel degenerative disc disease is noted in the lumbar spine.  IMPRESSION: No evidence of bowel obstruction or ileus. Stool is noted throughout the colon which may represent constipation. Interval development of moderate right pleural effusion with probable underlying atelectasis. Increased right upper lobe opacity is noted concerning for pneumonia or atelectasis.   Electronically Signed   By: Roque Lias M.D.   On: 03/03/2014 13:37    Medications: I have reviewed the patient's current medications.   Assessment/Plan: 1) metastatic non-small cell lung cancer, adenocarcinoma  diagnosed in March of 2006 status post several treatment in the past and most recently with oral Tarceva for several years. She was found to have evidence for disease progression and rebiopsy of the progressive lesion showed EGFR resistant mutation. The patient was referred to [facility] for clinical trial for the positive EGFR resistant medication but unfortunately she was not eligible for the trial. We will continue off Tarceva for now until the approval of one of the new 3rd generation mutation EGFR tyrosine kinase inhibitor which is expected to happen in the next few months. 2) mental status change and confusion secondary to her recent brain metastasis: I would start the patient on Decadron 4 mg IV every 6 hours. I would also consult Dr. Kathrynn Running for evaluation and consideration of palliative radiotherapy to the metastatic brain lesions. 3) urinary retention: unclear etiology. She is followed by Dr. Isabel Caprice. Continues for [facility] catheter. 4) weight loss: consider  nutritional evaluation. Thank you for taking good care of [name], I will continue to follow the patient with you and assist in her management on as-needed basis.  LOS: 1 day    Kathy Wares K. 03/04/2014

## 2014-03-04 NOTE — Progress Notes (Signed)
Pt being cleaned from BM. AAOX3 denies pain or any other complaints. Calling light within reach. Will continue to monitor

## 2014-03-05 ENCOUNTER — Ambulatory Visit
Admission: RE | Admit: 2014-03-05 | Discharge: 2014-03-05 | Disposition: A | Discharge status: Home / Self Care | Payer: Medicare Other | Source: Ambulatory Visit | Attending: Radiation Oncology | Admitting: Radiation Oncology

## 2014-03-05 DIAGNOSIS — N390 Urinary tract infection, site not specified: Secondary | ICD-10-CM

## 2014-03-05 DIAGNOSIS — E43 Unspecified severe protein-calorie malnutrition: Secondary | ICD-10-CM | POA: Diagnosis present

## 2014-03-05 LAB — CBC
HCT: 26.8 % — ABNORMAL LOW (ref 36.0–46.0)
HEMOGLOBIN: 8.6 g/dL — AB (ref 12.0–15.0)
MCH: 30.9 pg (ref 26.0–34.0)
MCHC: 32.1 g/dL (ref 30.0–36.0)
MCV: 96.4 fL (ref 78.0–100.0)
Platelets: 258 10*3/uL (ref 150–400)
RBC: 2.78 MIL/uL — ABNORMAL LOW (ref 3.87–5.11)
RDW: 14.1 % (ref 11.5–15.5)
WBC: 6.7 10*3/uL (ref 4.0–10.5)

## 2014-03-05 LAB — COMPREHENSIVE METABOLIC PANEL
ALBUMIN: 1.8 g/dL — AB (ref 3.5–5.2)
ALK PHOS: 123 U/L — AB (ref 39–117)
ALT: 32 U/L (ref 0–35)
ANION GAP: 12 (ref 5–15)
AST: 53 U/L — ABNORMAL HIGH (ref 0–37)
BUN: 43 mg/dL — AB (ref 6–23)
CALCIUM: 7.6 mg/dL — AB (ref 8.4–10.5)
CO2: 23 mEq/L (ref 19–32)
CREATININE: 1.42 mg/dL — AB (ref 0.50–1.10)
Chloride: 103 mEq/L (ref 96–112)
GFR calc non Af Amer: 33 mL/min — ABNORMAL LOW (ref >90)
GFR, EST AFRICAN AMERICAN: 38 mL/min — AB (ref >90)
GLUCOSE: 154 mg/dL — AB (ref 70–99)
POTASSIUM: 4.6 meq/L (ref 3.7–5.3)
Sodium: 138 mEq/L (ref 137–147)
TOTAL PROTEIN: 5.5 g/dL — AB (ref 6.0–8.3)
Total Bilirubin: 0.5 mg/dL (ref 0.3–1.2)

## 2014-03-05 MED ORDER — DIPHENHYDRAMINE HCL 12.5 MG/5ML PO ELIX
12.5000 mg | ORAL_SOLUTION | Freq: Every evening | ORAL | Status: DC | PRN
  Administered 2014-03-06: 12.5 mg via ORAL
  Filled 2014-03-05: qty 5

## 2014-03-05 NOTE — Progress Notes (Signed)
OT Cancellation Note  Patient Details Name: Jamie Munoz MRN: [id number] DOB: Apr 14, 1927   Cancelled Treatment:    Reason Eval/Treat Not Completed: Other (comment).  Awaiting activity clarification; therapies orders were placed prior to MRI results  Zoeie Ritter 03/05/2014, 12:08 PM Lesle Chris, OTR/L [phone] 03/05/2014

## 2014-03-05 NOTE — Progress Notes (Signed)
  Radiation Oncology         [phone]) [phone] ________________________________  Name: Jamie Munoz  MRN: [id number]  Date: 03/03/2014  DOB: Jun 07, 1927  Chart Note:  I met with the patient and her family this evening. We discussed the recent findings and workup. We talked about a variety of options at this point. I laid out several choices including the following:   - comfort care without further interventions - palliative radiotherapy to the whole brain and sacral metastasis - repeat brain MRI with contrast followed by possible stereotactic radiosurgery to the brain metastases and palliative radiation to the sacral metastasis.  After reviewing the pros and cons of each, the patient and her family would like to attempt repeat brain MRI with contrast to better delineate the extent of intracranial disease burden. This will determine whether she is eligible for stereotactic radiosurgery. If so, stereotactic radiosurgery could be performed in a single session with minimal toxicity. Regardless of the MRI findings, patient would like to proceed with palliative radiation to the sacral metastasis in order to palliate pain and potentially palliate her neurologic bladder issues.  I took the liberty of placing an order for MRI with and without contrast with sedation. Hopefully, this can be performed this evening or tomorrow morning to facilitate initiation of therapy. ________________________________  Sheral Apley. Tammi Klippel, M.D.

## 2014-03-05 NOTE — Progress Notes (Signed)
[facility] PROGRESS NOTE  PEIGHTYN ROBERSON OZH:[id number] DOB: 03-27-1927 DOA: 03/03/2014 PCP: [name], MD   HPI/Subjective: Feels very weak, complaining about left-sided chest pain, no significant shortness of breath. Her nephew and POA at bedside was inquiring about hospice. Discussed twice a day of hospice with Dr. Julien Nordmann, who recommended to continue the steroids and radiation see the mental status afterwards.  Assessment/Plan:  Altered mental status -Somewhat improved after starting on Decadron, MRI brain shows brain metastasis.  -Radiation oncology has been consulted by Dr. Julien Nordmann for radiation.   Chronic pain -Patient is currently on OxyContin 10 mg twice a day at along with oxycodone 5 mg every 4 hours when necessary.  Brain metastasis with surrounding vasogenic edema -Brain metastasis with vasogenic edema, continue Decadron 4 mg IV every 6 hours.  ? Healthcare associated pneumonia- patient started on vancomycin and Zosyn, consider narrowing down the antibiotics in one to 2 days if patient remains stable.  ? UTI- patient has chronic indwelling Foley catheter, urine culture has been obtained. Continue antibiotics and follow the urine culture results.  Non-small cell lung cancer- patient has been followed by oncology as outpatient, radiation oncology consulted for possible radiation to the brain. Discussed with Dr Inda Merlin.  Acute on chronic kidney disease -Baseline creatinine is 1.3, presented with creatinine of 1.9. -With IV fluid hydration creatinine went down to 1.4 on 9/16.  Chronic anemia- secondary to anemia of chronic disease, hemoglobin is 8.5 today. Follow CBC in a.m.  DVT prophylaxis- heparin  Code Status: DO NOT RESUSCITATE Family Communication: Discussed with patient's nephew at bedside Disposition Plan: To be decided   Consultants:  Oncology  Radiation oncology  Procedures:  *None  Antibiotics: Vancomycin 9/14 Zosyn  9/14    Objective: Filed Vitals:   03/05/14 1444  BP: 134/57  Pulse: 83  Temp: 97.6 F (36.4 C)  Resp: 19    Intake/Output Summary (Last 24 hours) at 03/05/14 1502 Last data filed at 03/05/14 1446  Gross per 24 hour  Intake 508.33 ml  Output   1001 ml  Net -492.67 ml   Filed Weights   03/03/14 1900  Weight: 53.978 kg (119 lb)    Exam:  Physical Exam: Head: Normocephalic, atraumatic.  Lungs: Normal respiratory effort. B/L Clear to auscultation, no crackles or wheezes.  Heart: Regular RR. S1 and S2 normal  Abdomen: BS normoactive. Soft, Nondistended, non-tender.  Extremities: No pretibial edema, no erythema   Data Reviewed: Basic Metabolic Panel:  Recent Labs Lab 03/03/14 1243 03/03/14 2340 03/04/14 0416 03/05/14 0428  NA 131* 132* 135* 138  K 5.5* 5.8* 4.9 4.6  CL 92* 97 99 103  CO2 25 23 23 23   GLUCOSE 111* 102* 121* 154*  BUN 57* 53* 49* 43*  CREATININE 1.91* 1.74* 1.69* 1.42*  CALCIUM 9.2 8.6 8.1* 7.6*   Liver Function Tests:  Recent Labs Lab 03/03/14 1243 03/04/14 0416 03/05/14 0428  AST 40* 37 53*  ALT 21 20 32  ALKPHOS 118* 108 123*  BILITOT 0.9 0.7 0.5  PROT 6.0 5.5* 5.5*  ALBUMIN 2.2* 1.8* 1.8*    Recent Labs Lab 03/03/14 1243  LIPASE 16   No results found for this basename: AMMONIA,  in the last 168 hours CBC:  Recent Labs Lab 03/03/14 1243 03/04/14 0416 03/05/14 0427  WBC 7.7 6.6 6.7  NEUTROABS 6.4  --   --   HGB 9.2* 8.5* 8.6*  HCT 27.3* 25.1* 26.8*  MCV 93.2 93.3 96.4  PLT 278 252 258  Cardiac Enzymes: No results found for this basename: CKTOTAL, CKMB, CKMBINDEX, TROPONINI,  in the last 168 hours BNP (last 3 results) No results found for this basename: PROBNP,  in the last 8760 hours CBG: No results found for this basename: GLUCAP,  in the last 168 hours  No results found for this or any previous visit (from the past 240 hour(s)).   Studies: Mr Brain Wo Contrast  03/03/2014   CLINICAL DATA:  History of  lung cancer.  Confusion.  EXAM: MRI HEAD WITHOUT CONTRAST  TECHNIQUE: Multiplanar, multiecho pulse sequences of the brain and surrounding structures were obtained without intravenous contrast. Per technologist no, patient was unable to remain still during the examination, no contrast media administered.  COMPARISON:  CT of the head January 06, 2010  FINDINGS: Moderately motion degraded examination.  Punctate foci of reduced diffusion in RIGHT inferior cerebellum, associated with minimal T2 hyperintense signal though motion limits assessment, concerning for metastasis.  2.5 x 3.7 cm low signal mass in LEFT temporal occipital lobe surrounding T2 bright vasogenic edema. T2 bright RIGHT inferior basal ganglia 10 mm ovoid lesion with surrounding vasogenic edema. 1.5 x 1.6 cm low signal mass and LEFT occipital gray-white matter junction with surrounding T2 bright vasogenic edema. 9 mm round T2 hypointense lesion in RIGHT frontal great white matter junction with small amount of surrounding vasogenic edema. Lesion showed reduced diffusion consistent with hypercellular disease. Punctate focus of susceptibility artifact associated of without high RIGHT frontal and LEFT occipital lobe lesions/metastasis. Local mass effect associated with all lesions, without significant midline shift. Partially effaced LEFT occipital horn, without hydrocephalus.  No abnormal extra-axial fluid collections. Normal major intracranial vascular flow voids seen at the skull base.  Status post bilateral ocular lens implants. Patchy areas of low T1 signal in the calvarium concerning for metastatic involvement. No abnormal sellar expansion. No cerebellar tonsillar ectopia.  IMPRESSION: Limited noncontrast MRI of the brain, further degraded by patient motion.  At least 4 supratentorial metastasis, largest in LEFT temporal occipital lobe measuring 2.5 x 3.7 cm. Local mass effect without significant midline shift.  Tiny RIGHT inferior cerebellum acute  infarct versus metastasis.  Abnormal calvarial bone marrow signal concerning for osseous metastasis.   Electronically Signed   By: Elon Alas   On: 03/03/2014 23:05   Mr Thoracic Spine Wo Contrast  03/04/2014   CLINICAL DATA:  Assess for metastasis.  EXAM: MRI THORACIC AND LUMBAR SPINE WITHOUT CONTRAST  TECHNIQUE: Multiplanar and multiecho pulse sequences of the thoracic and lumbar spine were obtained without intravenous contrast. Patient was unable to remains filled, and contrast was not administered.  COMPARISON:  CT of the chest, abdomen and pelvis November 25, 2013  FINDINGS: MR THORACIC SPINE FINDINGS  Mildly motion degraded examination.  Thoracic vertebral bodies appear intact and aligned and maintenance of thoracic kyphosis. Low T1 and bright T2 lesion in vertebral body T2 suggests a hemangioma. On localizer, low T1 signal within the C5 vertebra. Intervertebral discs demonstrate normal morphology, slightly decreased T2 signal within all discs most consistent with mild desiccation. No STIR signal abnormality with exception of the T2 lesion.  Motion degrades assessment for a cervical spinal cord signal abnormality, though there is no syrinx. Included view of the lungs demonstrates a large RIGHT upper lobe mass and large RIGHT pleural effusion.  No canal stenosis or neural foraminal narrowing at any thoracic level.  MR LUMBAR SPINE FINDINGS  Expansile low T1, bright T2 lesion in L3, extending into the RIGHT pedicle with pathologic  severe fracture predominantly on the RIGHT. In addition, low T1 signal with mild bright T2 signal within the LEFT aspect of L2 consistent with metastasis with small RIGHT L2 vertebral body lesion. Small L1 hemangioma.  No malalignment. Moderate to severe remote L2-3 through all L5-S1 disc degeneration. Conus medullaris terminates at L1 and appears normal in morphology and signal characteristics. Cauda equina is unremarkable. Moderate to severe paraspinal muscle atrophy. Partially  characterized retroperitoneal lymphadenopathy. Low T1, bright T2 signal small lesions in the liver could reflect metastasis.  Expansile low T1, bright STIR signal in the included sacrum seen only on sagittal sequences consistent with metastatic disease.  Level by level evaluation:  T12-L1: Annular bulging, no canal stenosis or neural foraminal narrowing.  L1-2 No disc bulge, canal stenosis nor neural foraminal narrowing.  L2-3: Approximate 4 mm retropulsed bony fragments. Mild to moderate canal stenosis. No neural significant neural foraminal narrowing.  L3-4: Minimal retropulsed bony fragments and disc material asymmetric to the LEFT. Mild facet arthropathy and ligamentum flavum redundancy. No canal stenosis. Mild LEFT neural foraminal narrowing.  L4-5: Small broad-based disc bulge there are mild facet arthropathy and ligamentum flavum redundancy without canal stenosis. Minimal RIGHT neural foraminal narrowing.  L5-S1: Small broad-based disc bulge. Moderate to severe facet arthropathy. No canal stenosis. Mild LEFT neural foraminal narrowing.  IMPRESSION: MR THORACIC SPINE IMPRESSION  Limited noncontrast examination further degraded by motion.  No convincing evidence of thoracic spine osseous metastasis ; no pathologic fracture or malalignment. Probable hemangioma T2.  Possible C5 metastasis seen on the localizer.  Large RIGHT upper lobe mass and a large RIGHT pleural effusion partially imaged.  MR LUMBAR SPINE IMPRESSION  Limited noncontrast examination.  Severe L3 pathologic fracture with underlying metastasis. Additional L2 metastasis without fracture. Partially imaged sacral metastasis.  Mild to moderate canal stenosis at L2-3. Minimal to mild L3-4 through L5-S1 neural foraminal narrowing.   Electronically Signed   By: Elon Alas   On: 03/04/2014 02:56   Mr Lumbar Spine Wo Contrast  03/04/2014   CLINICAL DATA:  Assess for metastasis.  EXAM: MRI THORACIC AND LUMBAR SPINE WITHOUT CONTRAST  TECHNIQUE:  Multiplanar and multiecho pulse sequences of the thoracic and lumbar spine were obtained without intravenous contrast. Patient was unable to remains filled, and contrast was not administered.  COMPARISON:  CT of the chest, abdomen and pelvis November 25, 2013  FINDINGS: MR THORACIC SPINE FINDINGS  Mildly motion degraded examination.  Thoracic vertebral bodies appear intact and aligned and maintenance of thoracic kyphosis. Low T1 and bright T2 lesion in vertebral body T2 suggests a hemangioma. On localizer, low T1 signal within the C5 vertebra. Intervertebral discs demonstrate normal morphology, slightly decreased T2 signal within all discs most consistent with mild desiccation. No STIR signal abnormality with exception of the T2 lesion.  Motion degrades assessment for a cervical spinal cord signal abnormality, though there is no syrinx. Included view of the lungs demonstrates a large RIGHT upper lobe mass and large RIGHT pleural effusion.  No canal stenosis or neural foraminal narrowing at any thoracic level.  MR LUMBAR SPINE FINDINGS  Expansile low T1, bright T2 lesion in L3, extending into the RIGHT pedicle with pathologic severe fracture predominantly on the RIGHT. In addition, low T1 signal with mild bright T2 signal within the LEFT aspect of L2 consistent with metastasis with small RIGHT L2 vertebral body lesion. Small L1 hemangioma.  No malalignment. Moderate to severe remote L2-3 through all L5-S1 disc degeneration. Conus medullaris terminates at L1 and appears  normal in morphology and signal characteristics. Cauda equina is unremarkable. Moderate to severe paraspinal muscle atrophy. Partially characterized retroperitoneal lymphadenopathy. Low T1, bright T2 signal small lesions in the liver could reflect metastasis.  Expansile low T1, bright STIR signal in the included sacrum seen only on sagittal sequences consistent with metastatic disease.  Level by level evaluation:  T12-L1: Annular bulging, no canal stenosis  or neural foraminal narrowing.  L1-2 No disc bulge, canal stenosis nor neural foraminal narrowing.  L2-3: Approximate 4 mm retropulsed bony fragments. Mild to moderate canal stenosis. No neural significant neural foraminal narrowing.  L3-4: Minimal retropulsed bony fragments and disc material asymmetric to the LEFT. Mild facet arthropathy and ligamentum flavum redundancy. No canal stenosis. Mild LEFT neural foraminal narrowing.  L4-5: Small broad-based disc bulge there are mild facet arthropathy and ligamentum flavum redundancy without canal stenosis. Minimal RIGHT neural foraminal narrowing.  L5-S1: Small broad-based disc bulge. Moderate to severe facet arthropathy. No canal stenosis. Mild LEFT neural foraminal narrowing.  IMPRESSION: MR THORACIC SPINE IMPRESSION  Limited noncontrast examination further degraded by motion.  No convincing evidence of thoracic spine osseous metastasis ; no pathologic fracture or malalignment. Probable hemangioma T2.  Possible C5 metastasis seen on the localizer.  Large RIGHT upper lobe mass and a large RIGHT pleural effusion partially imaged.  MR LUMBAR SPINE IMPRESSION  Limited noncontrast examination.  Severe L3 pathologic fracture with underlying metastasis. Additional L2 metastasis without fracture. Partially imaged sacral metastasis.  Mild to moderate canal stenosis at L2-3. Minimal to mild L3-4 through L5-S1 neural foraminal narrowing.   Electronically Signed   By: Elon Alas   On: 03/04/2014 02:56    Scheduled Meds: . dexamethasone  4 mg Intravenous 4 times per day  . docusate sodium  100 mg Oral BID  . feeding supplement (ENSURE COMPLETE)  237 mL Oral Q24H  . heparin  5,000 Units Subcutaneous 3 times per day  . OxyCODONE  10 mg Oral Q12H  . piperacillin-tazobactam (ZOSYN)  IV  3.375 g Intravenous Q8H  . polyethylene glycol  17 g Oral BID  . sodium chloride  3 mL Intravenous Q12H  . vancomycin  500 mg Intravenous Q24H   Continuous Infusions:   Principal  Problem:   ARF (acute renal failure) Active Problems:   Lung cancer   Non-small cell carcinoma of lung   Dehydration   Acute encephalopathy   Pleural effusion on right   Pneumonia   Hyperkalemia   Chronic indwelling Foley catheter   UTI (lower urinary tract infection)   Normocytic anemia   Protein-calorie malnutrition, severe    Time spent: 25 minutes    [facility]  [facility] Pager [phone] 7PM-7AM, please contact night-coverage at [email or web address], password [facility] 03/05/2014, 3:02 PM  LOS: 2 days

## 2014-03-05 NOTE — Clinical Documentation Improvement (Signed)
Presents with ARF, Severe Malnutrition, Pneumonia, metastatic lung cancer to spine, brain and adrenal glands, worsening confusion.   Vasogenic Edema noted in MRI of Brain; Coders can't code for Radiology reports; must be documented by Attending  Decadron 4 mg IV Q6H initiated  Please clarify if you feel cerebral edema has ruled in or out and document findings in next progress note and discharge summary.   Thank You, Zoila Shutter ,RN Clinical Documentation Specialist:  [place] Information Management

## 2014-03-05 NOTE — Progress Notes (Signed)
  Radiation Oncology         [phone]) [phone] ________________________________  Name: Jamie Munoz MRN: [id number]  Date: 03/03/2014  DOB: 1926/07/31  DIAGNOSIS:  80 yo woman with stage IV lung cancer s/p previous radiotherapy: 1.  The lumbar spine from L2-L4 was treated to 30 Gy in 10 fractions in 3/20-3/31/06 for palliation of metastatic disease at L3 treated by vertebroplasty before radiation   2.  The right upper lung primary cancer was treated to 35 Gy in June 2012    Chart Note:  I reviewed this patient's most recent findings and wanted to take a minute to document my impression.    She has at least 4 new brain metastases with the largest at 3.7 cm.  Depending on her clinical status and performance status, we may want to consider radiotherapy, either in the form of stereotactic radiosurgery or whole brain irradiation.  She has pronounced cerebral edema on MRI.  So, I am in agreement with continuation of dexamethasone, and assess clinical cognitive improvement before deciding on treatment options for her brain lesions.      She also has recently been suffering with back pain and urinary continence issues.  This may be partially explained by sacral bone metastasis seen on spine MRI along with L2 and L3 metastases.  She may benefit from radiotherapy to these areas.  Previous radiation to L3 may mandate spinal radiosurgery at that level.    Will see patient later today and review brain and spine imaging with neurosurgery.  If patient improves with steroids and pain management, I would recommend brian MRI with contrast using 1 mm axial slices (SRS Protocol).  ________________________________  Sheral Apley Tammi Klippel, M.D.

## 2014-03-05 NOTE — Progress Notes (Signed)
Tresa Endo PT [phone]

## 2014-03-06 ENCOUNTER — Inpatient Hospital Stay (HOSPITAL_COMMUNITY): Payer: Medicare Other

## 2014-03-06 ENCOUNTER — Ambulatory Visit
Admit: 2014-03-06 | Discharge: 2014-03-06 | Disposition: A | Discharge status: Home / Self Care | Payer: Medicare Other | Source: Ambulatory Visit | Attending: Radiation Oncology | Admitting: Radiation Oncology

## 2014-03-06 ENCOUNTER — Telehealth: Payer: Self-pay | Admitting: Internal Medicine

## 2014-03-06 ENCOUNTER — Inpatient Hospital Stay (HOSPITAL_COMMUNITY): Admit: 2014-03-06 | Payer: Medicare Other

## 2014-03-06 ENCOUNTER — Other Ambulatory Visit (HOSPITAL_COMMUNITY): Payer: Medicare Other

## 2014-03-06 ENCOUNTER — Ambulatory Visit
Admit: 2014-03-06 | Discharge: 2014-03-06 | Disposition: A | Discharge status: Home / Self Care | Payer: Medicare Other | Attending: Radiation Oncology | Admitting: Radiation Oncology

## 2014-03-06 DIAGNOSIS — J189 Pneumonia, unspecified organism: Secondary | ICD-10-CM

## 2014-03-06 LAB — VANCOMYCIN, TROUGH: VANCOMYCIN TR: 10.1 ug/mL (ref 10.0–20.0)

## 2014-03-06 MED ORDER — VANCOMYCIN HCL IN DEXTROSE 750-5 MG/150ML-% IV SOLN
750.0000 mg | INTRAVENOUS | Status: DC
  Administered 2014-03-07: 750 mg via INTRAVENOUS
  Filled 2014-03-06: qty 150

## 2014-03-06 MED ORDER — TRAZODONE HCL 50 MG PO TABS: 50.0000 mg | ORAL_TABLET | Freq: Every evening | ORAL | Status: DC | PRN

## 2014-03-06 MED ORDER — MAGNESIUM HYDROXIDE 400 MG/5ML PO SUSP: 30.0000 mL | Freq: Once | ORAL | Status: DC

## 2014-03-06 MED ORDER — HYDROMORPHONE HCL 1 MG/ML IJ SOLN
0.5000 mg | INTRAMUSCULAR | Status: DC | PRN
  Administered 2014-03-06 – 2014-03-11 (×21): 0.5 mg via INTRAVENOUS
  Filled 2014-03-06 (×21): qty 1

## 2014-03-06 MED ORDER — BISACODYL 10 MG RE SUPP
10.0000 mg | Freq: Once | RECTAL | Status: AC
  Administered 2014-03-06: 10 mg via RECTAL
  Filled 2014-03-06: qty 1

## 2014-03-06 MED ORDER — LORAZEPAM 2 MG/ML IJ SOLN
1.0000 mg | Freq: Once | INTRAMUSCULAR | Status: AC | PRN
  Administered 2014-03-06: 2 mg via INTRAVENOUS
  Filled 2014-03-06: qty 1

## 2014-03-06 NOTE — Progress Notes (Signed)
  Radiation Oncology         [phone]) [phone] ________________________________  Name: Jamie Munoz  MRN: [id number]  Date: 03/03/2014  DOB: [birth year]  Chart Note:  In effort to consider possible stereotactic radiosurgery for brain metastases, we attempted to proceed with brain MRI with contrast today. The patient was unable to tolerate the procedure and I discussed that with her power of attorney, [name].  After discussing the implications of this attempted procedure and the pros and cons of the various possible interventions at this point, the patient is interested in proceeding with whole brain radiotherapy for palliation.  She will be brought to the radiation clinic later today to start radiation treatments to her sacral metastasis and whole brain.  ________________________________  Sheral Apley. Tammi Klippel, M.D.

## 2014-03-06 NOTE — Telephone Encounter (Signed)
s.w. pt and advised on appt change...ok adn aware....pt is currently in the hospital and my not need to keep that appt.Marland Kitchen

## 2014-03-06 NOTE — Progress Notes (Signed)
  Radiation Oncology         [phone]) [phone] ________________________________  Name: Jamie Munoz MRN: [id number]  Date: 03/03/2014  DOB: 07/31/1926  INPATIENT  SIMULATION AND TREATMENT PLANNING NOTE  DIAGNOSIS:  80 year old woman with multiple brain metastases and symptomatic sacral metastasis.  NARRATIVE:  The patient was brought to the [facility].  Identity was confirmed.  All relevant records and images related to the planned course of therapy were reviewed.  The patient freely provided informed written consent to proceed with treatment after reviewing the details related to the planned course of therapy. The consent form was witnessed and verified by the simulation staff.  Then, the patient was set-up in a stable reproducible  supine position for radiation therapy.  CT images were obtained.  Surface markings were placed.  The CT images were loaded into the planning software.  Then the target and avoidance structures were contoured.  Treatment planning then occurred.  The radiation prescription was entered and confirmed.  Then, I designed and supervised the construction of a total of 8 medically necessary complex treatment devices. These included a thermoplastic head cast for whole brain radiation with 2 multileaf collimator aperture as to shape radiation around the entire intracranial contents from the right and left gantry positions using slight anterior oblique angles to minimize radiation exit through the contralateral eye. The devices also included in body fix immobilization device for her legs for pelvic radiation and 4 multileaf collimators to shape radiation around the disease around the sacrum.  I have requested : Isodose Plan.  I have ordered:Nutrition Consult  PLAN:  The patient will receive 30 Gy in 12 fractions to the brain and 30 Gy in 10 fractions to the sacral metastases..  ________________________________  Sheral Apley Tammi Klippel, M.D.

## 2014-03-06 NOTE — Progress Notes (Signed)
[facility] PROGRESS NOTE  Jamie Munoz QMG:[id number] DOB: Jun 03, 1927 DOA: 03/03/2014 PCP: [name], MD   HPI/Subjective: Continued to feel weak, had problems with constipation, pain control and lack of sleep last night. Seen with pure at bedside.  Assessment/Plan:  Altered mental status -Somewhat improved after starting on Decadron, MRI brain shows brain metastasis.  -Radiation oncology has been consulted by Dr. Julien Nordmann for radiation.  -MRI to be done, patient unable to tolerate it.  Chronic pain -Patient is currently on OxyContin 10 mg twice a day at along with oxycodone 5 mg every 4 hours when necessary.  Brain metastasis with surrounding vasogenic edema -Brain metastasis with vasogenic edema, continue Decadron 4 mg IV every 6 hours. -Per radiation oncology whole brain radiation, SRS considered.  ? Healthcare associated pneumonia -Patient started on vancomycin and Zosyn, if he stable will do escalate to oral antibiotics in a.m.  ? UTI- patient has chronic indwelling Foley catheter, urine culture has been obtained. Continue antibiotics and follow the urine culture results.  Non-small cell lung cancer- patient has been followed by oncology as outpatient, radiation oncology consulted for possible radiation to the brain. Discussed with Dr Inda Merlin.  Acute on chronic kidney disease -Baseline creatinine is 1.3, presented with creatinine of 1.9. -With IV fluid hydration creatinine went down to 1.4 on 9/16.  Chronic anemia- secondary to anemia of chronic disease, hemoglobin is 8.5 today. Follow CBC in a.m.  DVT prophylaxis- heparin  Code Status: DO NOT RESUSCITATE Family Communication: Discussed with patient's nephew at bedside Disposition Plan: To be decided   Consultants:  Oncology  Radiation oncology  Procedures:  *None  Antibiotics: Vancomycin 9/14 Zosyn 9/14    Objective: Filed Vitals:   03/06/14 1354  BP: 122/56  Pulse: 84  Temp: 98  F (36.7 C)  Resp: 20    Intake/Output Summary (Last 24 hours) at 03/06/14 1429 Last data filed at 03/06/14 1354  Gross per 24 hour  Intake    560 ml  Output   1125 ml  Net   -565 ml   Filed Weights   03/03/14 1900  Weight: 53.978 kg (119 lb)    Exam:  Physical Exam: Head: Normocephalic, atraumatic.  Lungs: Normal respiratory effort. B/L Clear to auscultation, no crackles or wheezes.  Heart: Regular RR. S1 and S2 normal  Abdomen: BS normoactive. Soft, Nondistended, non-tender.  Extremities: No pretibial edema, no erythema   Data Reviewed: Basic Metabolic Panel:  Recent Labs Lab 03/03/14 1243 03/03/14 2340 03/04/14 0416 03/05/14 0428  NA 131* 132* 135* 138  K 5.5* 5.8* 4.9 4.6  CL 92* 97 99 103  CO2 25 23 23 23   GLUCOSE 111* 102* 121* 154*  BUN 57* 53* 49* 43*  CREATININE 1.91* 1.74* 1.69* 1.42*  CALCIUM 9.2 8.6 8.1* 7.6*   Liver Function Tests:  Recent Labs Lab 03/03/14 1243 03/04/14 0416 03/05/14 0428  AST 40* 37 53*  ALT 21 20 32  ALKPHOS 118* 108 123*  BILITOT 0.9 0.7 0.5  PROT 6.0 5.5* 5.5*  ALBUMIN 2.2* 1.8* 1.8*    Recent Labs Lab 03/03/14 1243  LIPASE 16   No results found for this basename: AMMONIA,  in the last 168 hours CBC:  Recent Labs Lab 03/03/14 1243 03/04/14 0416 03/05/14 0427  WBC 7.7 6.6 6.7  NEUTROABS 6.4  --   --   HGB 9.2* 8.5* 8.6*  HCT 27.3* 25.1* 26.8*  MCV 93.2 93.3 96.4  PLT 278 252 258   Cardiac Enzymes: No results  found for this basename: CKTOTAL, CKMB, CKMBINDEX, TROPONINI,  in the last 168 hours BNP (last 3 results) No results found for this basename: PROBNP,  in the last 8760 hours CBG: No results found for this basename: GLUCAP,  in the last 168 hours  Recent Results (from the past 240 hour(s))  URINE CULTURE     Status: None   Collection Time    03/04/14  3:58 PM      Result Value Ref Range Status   Specimen Description URINE, CATHETERIZED   Final   Special Requests NONE   Final   Culture   Setup Time     Final   Value: 03/04/2014 18:56     Performed at [place] PENDING   Incomplete   Culture     Final   Value: Culture reincubated for better growth     Performed at [facility]   Report Status PENDING   Incomplete     Studies: Mr Attempted Study-no Report  03/06/2014   This examination belongs to an outside facility and is stored  here for comparison purposes only.  Contact the originating outside  institution for any associated report or interpretation.   Scheduled Meds: . dexamethasone  4 mg Intravenous 4 times per day  . docusate sodium  100 mg Oral BID  . feeding supplement (ENSURE COMPLETE)  237 mL Oral Q24H  . heparin  5,000 Units Subcutaneous 3 times per day  . OxyCODONE  10 mg Oral Q12H  . piperacillin-tazobactam (ZOSYN)  IV  3.375 g Intravenous Q8H  . polyethylene glycol  17 g Oral BID  . sodium chloride  3 mL Intravenous Q12H  . vancomycin  500 mg Intravenous Q24H   Continuous Infusions:   Principal Problem:   ARF (acute renal failure) Active Problems:   Lung cancer   Non-small cell carcinoma of lung   Dehydration   Acute encephalopathy   Pleural effusion on right   Pneumonia   Hyperkalemia   Chronic indwelling Foley catheter   UTI (lower urinary tract infection)   Normocytic anemia   Protein-calorie malnutrition, severe    Time spent: 25 minutes    [facility] A  [facility] Pager [phone] 7PM-7AM, please contact night-coverage at [email or web address], password [facility] 03/06/2014, 2:29 PM  LOS: 3 days

## 2014-03-06 NOTE — Progress Notes (Signed)
Spoke with Tess, RN, per Dr Alfredo Bach note pt to be done under conscious sedation. I explained that she will have to go to [facility] for that since it is against hospital policy for [place] to do it here. I gave her the direct number to MRI at [facility].  [phone].

## 2014-03-06 NOTE — Progress Notes (Addendum)
PT Cancellation Note  Patient Details Name: Jamie Munoz MRN: [id number] DOB: 08/05/1926   Cancelled Treatment:    Reason Eval/Treat Not Completed: Medical issues which prohibited therapy pt to go to [facility] for MRI this afternoon.      Kennita Pavlovich,KATHrine E 03/06/2014, 1:25 PM Carmelia Bake, PT, DPT 03/06/2014 Pager: [phone]

## 2014-03-06 NOTE — Progress Notes (Signed)
  Radiation Oncology         [phone]) [phone] ________________________________  Name: Jamie Munoz MRN: [id number]  Date: 03/03/2014  DOB: 10/21/1926  Simulation Verification Note  Status: inpatient  NARRATIVE: The patient was brought to the treatment unit and placed in the planned treatment position. The clinical setup was verified. Then port films were obtained and uploaded to the radiation oncology medical record software.  The treatment beams were carefully compared against the planned radiation fields. The position location and shape of the radiation fields was reviewed. They targeted volume of tissue appears to be appropriately covered by the radiation beams. Organs at risk appear to be excluded as planned.  Based on my personal review, I approved the simulation verification. The patient's treatment will proceed as planned.  ------------------------------------------------  Sheral Apley Tammi Klippel, M.D.

## 2014-03-06 NOTE — Progress Notes (Signed)
[place] [facility]iation [facility] Dept Therapy Treatment Record Phone [phone]   Radiation Therapy was administered to Jamie Munoz on: 03/06/2014  6:32 PM and was treatment # 1 out of a planned course of 12 treatments.

## 2014-03-06 NOTE — Progress Notes (Signed)
ANTIBIOTIC CONSULT NOTE - FOLLOW UP  Pharmacy Consult for Vancomycin and Zosyn Indication: pneumonia  Allergies  Allergen Reactions  . Codeine Shortness Of Breath    Patient Measurements: Height: 5\' 6"  (167.6 cm) Weight: 119 lb (53.978 kg) IBW/kg (Calculated) : 59.3  Vital Signs: Temp: 98.2 F (36.8 C) (09/17 0435) Temp src: Oral (09/17 0435) BP: 102/74 mmHg (09/17 0435) Pulse Rate: 97 (09/17 0435) Intake/Output from previous day: 09/16 0701 - 09/17 0700 In: 560 [P.O.:360; IV Piggyback:200] Out: 925 [Urine:925] Intake/Output from this shift:    Labs:  Recent Labs  03/03/14 1243 03/03/14 2340 03/04/14 0416 03/05/14 0427 03/05/14 0428  WBC 7.7  --  6.6 6.7  --   HGB 9.2*  --  8.5* 8.6*  --   PLT 278  --  252 258  --   CREATININE 1.91* 1.74* 1.69*  --  1.42*   Estimated Creatinine Clearance: 24.7 ml/min (by C-G formula based on Cr of 1.42). No results found for this basename: VANCOTROUGH, Corlis Leak, VANCORANDOM, [place], GENTPEAK, GENTRANDOM, TOBRATROUGH, TOBRAPEAK, TOBRARND, AMIKACINPEAK, AMIKACINTROU, AMIKACIN,  in the last 72 hours   Microbiology: Recent Results (from the past 720 hour(s))  URINE CULTURE     Status: None   Collection Time    02/10/14 11:50 PM      Result Value Ref Range Status   Specimen Description URINE, CATHETERIZED   Final   Special Requests NONE   Final   Culture  Setup Time     Final   Value: 02/11/2014 08:44     Performed at [place]     Final   Value: 45,000 COLONIES/ML     Performed at [facility]   Culture     Final   Value: Multiple bacterial morphotypes present, none predominant. Suggest appropriate recollection if clinically indicated.     Performed at [facility]   Report Status 02/13/2014 FINAL   Final  URINE CULTURE     Status: None   Collection Time    02/16/14  4:54 PM      Result Value Ref Range Status   Specimen Description URINE, CATHETERIZED   Final   Special  Requests NONE   Final   Culture  Setup Time     Final   Value: 02/17/2014 02:46     Performed at [place]     Final   Value: >=100,000 COLONIES/ML     Performed at [facility]   Culture     Final   Value: [place]     Performed at [facility]   Report Status 02/20/2014 FINAL   Final   Organism ID, Bacteria KLEBSIELLA PNEUMONIAE   Final   Organism ID, Bacteria PROTEUS MIRABILIS   Final  URINE CULTURE     Status: None   Collection Time    03/04/14  3:58 PM      Result Value Ref Range Status   Specimen Description URINE, CATHETERIZED   Final   Special Requests NONE   Final   Culture  Setup Time     Final   Value: 03/04/2014 18:56     Performed at [place] PENDING   Incomplete   Culture     Final   Value: Culture reincubated for better growth     Performed at [facility]   Report Status PENDING  Incomplete    Assessment: 80 yo F with history of Lung CA (~34yr). Admitted with worsening pain, weakness, difficulty walking, dehydrated, poor po intake. CXR on admission shows RUL opacity concerning for PNA, aspiration. Pharmacy consulted to dose Vancomycin and Zosyn.  9/14 >> Levaquin x 1 9/14 >>Zosyn >> 9/14 >>Vanc >>   Tmax: Afebrile WBCs: stable, WNL Renal: SCr down 1.42, CrCl 25 ml/min (CG), 33 ml/min (N)  Previous urine cx 8/30:  --Klebsiella (R-amp, S-all others) --Proteus (R-cipro, nitro; S-amp, cephs, AG, zosyn, bactrim; I-levo)  9/15 urine: pending   Goal of Therapy:  Vancomycin trough level 15-20 mcg/ml Zosyn dose per renal functon  Plan:   Continue Zosyn 3.375gm IV q8h (4hr extended infusions)  Continue Vancomycin 500mg  IV q24h Will go ahead and check trough tonight prior to next dose which will be the 4th dose Noted that Md may narrow abx's soon - will follow up   Adrian Saran, PharmD, BCPS Pager [phone] 03/06/2014 8:48 AM

## 2014-03-06 NOTE — Progress Notes (Signed)
OT Cancellation Note  Patient Details Name: Jamie Munoz MRN: [id number] DOB: 1926-09-14   Cancelled Treatment:    Reason Eval/Treat Not Completed: Fatigue/lethargy limiting ability to participate.  Clarified with Dr Hartford Poli that OT/PT can work with pt; however, she is asleep at this time from sedation earlier for procedure.  Noted that she has XRT simulation scheduled at 2:30.  Will reattempt tomorrow.  Leland Raver 03/06/2014, 1:24 PM Lesle Chris, OTR/L [phone] 03/06/2014

## 2014-03-06 NOTE — Progress Notes (Signed)
Patient scheduled for ct sim sacrum, possible whole brain. To arrive for simulation at 2:30 pm. Spoke w/Dess, RN to inform. She reports patient is without c/o pain this morning, has IV in RAC, on O2 @ 2L/min. She states pt is scheduled for MRI of brain under sedation at [place] today, time unknown. Informed her that pt's ct sim appointment is 60 min, and pt will need to remain quiet and still on table. Advised she assess pt for pain at 2 pm and medicate as needed. Dess verbalized understanding, agreement.

## 2014-03-06 NOTE — Progress Notes (Signed)
PHARMACY BRIEF NOTE - Drug Level Result  Consult for:  Vancomycin Indication:  Pneumonia  The current Vancomycin dose is 500 mg IV every 24 hours.  The Vancomycin level drawn at 17:10 today is reported as 10.1 mcg/ml, below the therapeutic range 15-20 mcg/ml.  Plan: Increase the Vancomycin dose to 750 mg IV every 24 hours, with the next dose given early in the dosing interval to provide a loading dose effect.  [place]. 03/06/2014 7:30 PM

## 2014-03-06 NOTE — Progress Notes (Signed)
[place] [facility] Dept Therapy Treatment Record Phone [phone]   Radiation Therapy was administered to Jamie Munoz on: 03/06/2014  6:32 PM and was treatment # 1 out of a planned course of 10 treatments.

## 2014-03-07 ENCOUNTER — Ambulatory Visit
Admit: 2014-03-07 | Discharge: 2014-03-07 | Disposition: A | Discharge status: Home / Self Care | Payer: Medicare Other | Attending: Radiation Oncology | Admitting: Radiation Oncology

## 2014-03-07 LAB — URINALYSIS, ROUTINE W REFLEX MICROSCOPIC
Bilirubin Urine: NEGATIVE
GLUCOSE, UA: NEGATIVE mg/dL
Ketones, ur: NEGATIVE mg/dL
LEUKOCYTES UA: NEGATIVE
NITRITE: NEGATIVE
PH: 7 (ref 5.0–8.0)
Protein, ur: >300 mg/dL — AB
SPECIFIC GRAVITY, URINE: 1.027 (ref 1.005–1.030)
Urobilinogen, UA: 1 mg/dL (ref 0.0–1.0)

## 2014-03-07 LAB — CBC
HCT: 24.5 % — ABNORMAL LOW (ref 36.0–46.0)
HEMOGLOBIN: 8 g/dL — AB (ref 12.0–15.0)
MCH: 31.3 pg (ref 26.0–34.0)
MCHC: 32.7 g/dL (ref 30.0–36.0)
MCV: 95.7 fL (ref 78.0–100.0)
Platelets: 287 10*3/uL (ref 150–400)
RBC: 2.56 MIL/uL — ABNORMAL LOW (ref 3.87–5.11)
RDW: 14 % (ref 11.5–15.5)
WBC: 10.6 10*3/uL — AB (ref 4.0–10.5)

## 2014-03-07 LAB — BASIC METABOLIC PANEL
Anion gap: 12 (ref 5–15)
BUN: 38 mg/dL — ABNORMAL HIGH (ref 6–23)
CHLORIDE: 102 meq/L (ref 96–112)
CO2: 24 mEq/L (ref 19–32)
Calcium: 7.2 mg/dL — ABNORMAL LOW (ref 8.4–10.5)
Creatinine, Ser: 1.24 mg/dL — ABNORMAL HIGH (ref 0.50–1.10)
GFR, EST AFRICAN AMERICAN: 45 mL/min — AB (ref >90)
GFR, EST NON AFRICAN AMERICAN: 38 mL/min — AB (ref >90)
GLUCOSE: 164 mg/dL — AB (ref 70–99)
Potassium: 4.2 mEq/L (ref 3.7–5.3)
SODIUM: 138 meq/L (ref 137–147)

## 2014-03-07 LAB — URINE MICROSCOPIC-ADD ON

## 2014-03-07 MED ORDER — LEVOFLOXACIN 250 MG PO TABS
250.0000 mg | ORAL_TABLET | Freq: Every day | ORAL | Status: DC
  Administered 2014-03-08 – 2014-03-09 (×2): 250 mg via ORAL
  Filled 2014-03-07 (×2): qty 1

## 2014-03-07 MED ORDER — SENNA 8.6 MG PO TABS
2.0000 | ORAL_TABLET | Freq: Every day | ORAL | Status: DC
  Administered 2014-03-07 – 2014-03-10 (×4): 17.2 mg via ORAL
  Filled 2014-03-07 (×4): qty 2

## 2014-03-07 MED ORDER — LORAZEPAM 2 MG/ML IJ SOLN
0.5000 mg | Freq: Once | INTRAMUSCULAR | Status: AC
Start: 2014-03-07 — End: 2014-03-07
  Administered 2014-03-07: 0.5 mg via INTRAVENOUS
  Filled 2014-03-07: qty 1

## 2014-03-07 MED ORDER — HALOPERIDOL LACTATE 5 MG/ML IJ SOLN
1.0000 mg | Freq: Two times a day (BID) | INTRAMUSCULAR | Status: DC | PRN
  Administered 2014-03-07 – 2014-03-11 (×2): 1 mg via INTRAVENOUS
  Filled 2014-03-07 (×2): qty 1

## 2014-03-07 MED ORDER — SORBITOL 70 % SOLN
960.0000 mL | TOPICAL_OIL | Freq: Once | ORAL | Status: AC
  Administered 2014-03-07: 960 mL via RECTAL
  Filled 2014-03-07: qty 240

## 2014-03-07 MED ORDER — SODIUM CHLORIDE 0.9 % IJ SOLN
10.0000 mL | INTRAMUSCULAR | Status: DC | PRN
  Administered 2014-03-11: 10 mL

## 2014-03-07 MED ORDER — LEVOFLOXACIN 500 MG PO TABS
500.0000 mg | ORAL_TABLET | Freq: Once | ORAL | Status: AC
  Administered 2014-03-07: 500 mg via ORAL
  Filled 2014-03-07: qty 1

## 2014-03-07 MED ORDER — TRAZODONE 25 MG HALF TABLET
25.0000 mg | ORAL_TABLET | Freq: Every day | ORAL | Status: DC
  Administered 2014-03-07: 25 mg via ORAL
  Filled 2014-03-07 (×2): qty 1

## 2014-03-07 NOTE — Progress Notes (Signed)
Patient was only able to handle about 372ml of the SMOG enema, no results at this time.

## 2014-03-07 NOTE — Progress Notes (Addendum)
Clinical Social Work Department CLINICAL SOCIAL WORK PLACEMENT NOTE 03/07/2014  Patient:  Jamie Munoz, Jamie Munoz  Account Number:  [id number] Admit date:  03/03/2014  Clinical Social Worker:  Renold Genta  Date/time:  03/07/2014 03:34 PM  Clinical Social Work is seeking post-discharge placement for this patient at the following level of care:   SKILLED NURSING   (*CSW will update this form in Epic as items are completed)   03/07/2014  Patient/family provided with [place] Department of Clinical Social Work's list of facilities offering this level of care within the geographic area requested by the patient (or if unable, by the patient's family).  03/07/2014  Patient/family informed of their freedom to choose among providers that offer the needed level of care, that participate in Medicare, Medicaid or managed care program needed by the patient, have an available bed and are willing to accept the patient.  03/07/2014  Patient/family informed of [facility]' ownership interest in [facility], as well as of the fact that they are under no obligation to receive care at this facility.  PASARR submitted to EDS on 03/07/2014 PASARR number received on 03/07/2014  FL2 transmitted to all facilities in geographic area requested by pt/family on  03/07/2014 FL2 transmitted to all facilities within larger geographic area on   Patient informed that his/her managed care company has contracts with or will negotiate with  certain facilities, including the following:     Patient/family informed of bed offers received:   Patient chooses bed at  Physician recommends and patient chooses bed at    Patient to be transferred to  on  [facility] on 03/11/2014 Patient to be transferred to facility by ambulance Corey Harold) Patient and family notified of transfer on 03/11/2014 Name of family member notified:  Pt nephew, Jamie Munoz at bedside  The following physician request were entered in  Epic:   Additional Comments:   Raynaldo Opitz, [place] Social Worker cell #: [phone]

## 2014-03-07 NOTE — Progress Notes (Signed)
Patient's urine has changed color from amber to red.  Patient had pain medication administered. PCP on call was notified.

## 2014-03-07 NOTE — Evaluation (Signed)
Physical Therapy Evaluation Patient Details Name: Jamie Munoz MRN: [id number] DOB: 08-Sep-1926 Today's Date: 03/07/2014   History of Present Illness  Jamie Munoz is a 80 y.o. 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 Patient is accompanied by her nephews. Patient was originally diagnosed with lung cancer about 9 years ago. She has been on Tarceva for many years. Recently, her cancer has mutated. No alternative treatment is available currently. Over the last few months patient has had a decline. Imaging studies done in June, showed worsening in her lung mass with adrenal metastases. At that time MRI showed metastatic process in the spine. She hasn't received any radiation treatment. She was seen by her oncologist recently and plan was to repeat the MRI and to consider referral to radiation oncology. However, patient could not tolerate the MRI and one is planned for October with sedation. According to the family patient has been having worsening confusion over the last few weeks. She's had worsening ability to walk in the last week to 2 weeks  Clinical Impression  Pt pleasant and cooperative but currently requiring +2 assist for mobility 2* pain and generalized weakness.  Pt will benefit from follow up rehab at SNF level to maximize IND and safety prior to any attempt to return home.    Follow Up Recommendations SNF    Equipment Recommendations  None recommended by PT    Recommendations for Other Services OT consult     Precautions / Restrictions Precautions Precautions: Fall Restrictions Weight Bearing Restrictions: No      Mobility  Bed Mobility Overal bed mobility: +2 for physical assistance;Needs Assistance Bed Mobility: Supine to Sit;Sit to Supine     Supine to sit: +2 for physical assistance;Max  assist Sit to supine: Max assist;+2 for physical assistance   General bed mobility comments: multimodal cues for log roll and sequencing to move to/from sitting; physical assist for LE management and to control trunk  Transfers Overall transfer level: Needs assistance Equipment used: Rolling walker (2 wheeled) Transfers: Sit to/from Stand Sit to Stand: +2 physical assistance;Mod assist         General transfer comment: Cues for transition position and use of UEs to self assist; physical assist to bring wt up and fwd  Ambulation/Gait Ambulation/Gait assistance: Mod assist;+2 physical assistance Ambulation Distance (Feet): 1 Feet Assistive device: Rolling walker (2 wheeled) Gait Pattern/deviations: Step-to pattern;Decreased step length - right;Decreased step length - left;Shuffle     General Gait Details: cues for posture and position from RW; assist for balance, support, RW management  Stairs            Wheelchair Mobility    Modified Rankin (Stroke Patients Only)       Balance                                             Pertinent Vitals/Pain Pain Assessment: 0-10 Pain Score: 9  Pain Location: shoulder and ribs Pain Intervention(s): Limited activity within patient's tolerance;Monitored during session;Premedicated before session    Home Living Family/patient expects to be discharged to:: Skilled nursing facility Living Arrangements: Alone  Prior Function Level of Independence: Independent with assistive device(s)               Hand Dominance        Extremity/Trunk Assessment   Upper Extremity Assessment: Generalized weakness           Lower Extremity Assessment: Generalized weakness      Cervical / Trunk Assessment: Kyphotic  Communication   Communication: No difficulties  Cognition Arousal/Alertness: Awake/alert Behavior During Therapy: WFL for tasks assessed/performed Overall Cognitive  Status: Within Functional Limits for tasks assessed                      General Comments      Exercises        Assessment/Plan    PT Assessment Patient needs continued PT services  PT Diagnosis Difficulty walking   PT Problem List Decreased strength;Decreased range of motion;Decreased activity tolerance;Decreased balance;Decreased mobility;Decreased knowledge of use of DME;Pain  PT Treatment Interventions DME instruction;Gait training;Functional mobility training;Therapeutic activities;Therapeutic exercise;Patient/family education   PT Goals (Current goals can be found in the Care Plan section) Acute Rehab PT Goals Patient Stated Goal: did not state PT Goal Formulation: With patient/family Time For Goal Achievement: 03/07/14 Potential to Achieve Goals: Fair    Frequency Min 3X/week   Barriers to discharge        Co-evaluation PT/OT/SLP Co-Evaluation/Treatment: Yes Reason for Co-Treatment: For patient/therapist safety PT goals addressed during session: Mobility/safety with mobility OT goals addressed during session: ADL's and self-care       End of Session Equipment Utilized During Treatment: Gait belt Activity Tolerance: Patient limited by pain;Patient limited by fatigue Patient left: in bed;with call bell/phone within reach;with family/visitor present Nurse Communication: Mobility status         Time: 1225-1255 PT Time Calculation (min): 30 min   Charges:   PT Evaluation $Initial PT Evaluation Tier I: 1 Procedure     PT G Codes:          Jamie Munoz 03/07/2014, 5:17 PM

## 2014-03-07 NOTE — Progress Notes (Signed)
Patient has been asleep for few hours now. Patient's sister also rested for a few hours.  Will continue to monitor the patient.

## 2014-03-07 NOTE — Progress Notes (Signed)
[facility] PROGRESS NOTE  Jamie Munoz:[id number] DOB: 04/13/1927 DOA: 03/03/2014 PCP: [name], MD   HPI/Subjective: Seen with sister and nephew at bedside. Her sister who spent the night with her described what appear to be agitation and sundowning at nighttime. Her urine color change to red last night, likely secondary to minor trauma from pulling the Foley catheter secondary to agitation.  Assessment/Plan:  Altered mental status -Somewhat improved after starting on Decadron, MRI brain shows brain metastasis.  -Radiation oncology has been consulted by Dr. Julien Nordmann for radiation.  -MRI to be done, patient unable to tolerate it.  Chronic pain -Patient is currently on OxyContin 10 mg twice a day at along with oxycodone 5 mg every 4 hours when necessary.  Brain metastasis with surrounding vasogenic edema -Brain metastasis with vasogenic edema, continue Decadron 4 mg IV every 6 hours. -Per radiation oncology whole brain radiation, SRS considered. -MRI repeated (9/17) because of previous motion artifact, patient couldn't stay still even with 2 mg of Ativan.  ? Healthcare associated pneumonia -Patient started on vancomycin and Zosyn, and will switch antibiotics to levofloxacin  ? UTI Patient has chronic indwelling Foley catheter, urine culture has been obtained.  Urine culture showed insignificant colony count for staph aureus. Urine turned red, likely secondary to minimal injury from pulling the catheter while she was agitated. -Heparin discontinued, urine is clear and.  Non-small cell lung cancer- patient has been followed by oncology as outpatient, radiation oncology consulted for possible radiation to the brain. Discussed with Dr Inda Merlin.  Acute on chronic kidney disease -Baseline creatinine is 1.3, presented with creatinine of 1.9. -Creatinine is improving, today is 1.2  Chronic anemia- secondary to anemia of chronic disease, hemoglobin is 8.5 today.  Follow CBC in a.m.  Sundowning -Patient sister described what appears to be sundowning and agitation at nighttime. -Patient given trazodone to help with sleep, Haldol as needed for agitation  DVT prophylaxis-SCDs  Code Status: DO NOT RESUSCITATE Family Communication: Discussed with patient's nephew at bedside Disposition Plan: To be decided   Consultants:  Oncology  Radiation oncology  Procedures:  *None  Antibiotics: Vancomycin 9/14 Zosyn 9/14    Objective: Filed Vitals:   03/07/14 0011  BP: 148/61  Pulse: 105  Temp: 97.7 F (36.5 C)  Resp: 20    Intake/Output Summary (Last 24 hours) at 03/07/14 1414 Last data filed at 03/07/14 1153  Gross per 24 hour  Intake    630 ml  Output    500 ml  Net    130 ml   Filed Weights   03/03/14 1900  Weight: 53.978 kg (119 lb)    Exam:  Physical Exam: Head: Normocephalic, atraumatic.  Lungs: Normal respiratory effort. B/L Clear to auscultation, no crackles or wheezes.  Heart: Regular RR. S1 and S2 normal  Abdomen: BS normoactive. Soft, Nondistended, non-tender.  Extremities: No pretibial edema, no erythema   Data Reviewed: Basic Metabolic Panel:  Recent Labs Lab 03/03/14 1243 03/03/14 2340 03/04/14 0416 03/05/14 0428 03/07/14 0455  NA 131* 132* 135* 138 138  K 5.5* 5.8* 4.9 4.6 4.2  CL 92* 97 99 103 102  CO2 25 23 23 23 24   GLUCOSE 111* 102* 121* 154* 164*  BUN 57* 53* 49* 43* 38*  CREATININE 1.91* 1.74* 1.69* 1.42* 1.24*  CALCIUM 9.2 8.6 8.1* 7.6* 7.2*   Liver Function Tests:  Recent Labs Lab 03/03/14 1243 03/04/14 0416 03/05/14 0428  AST 40* 37 53*  ALT 21 20 32  ALKPHOS 118*  108 123*  BILITOT 0.9 0.7 0.5  PROT 6.0 5.5* 5.5*  ALBUMIN 2.2* 1.8* 1.8*    Recent Labs Lab 03/03/14 1243  LIPASE 16   No results found for this basename: AMMONIA,  in the last 168 hours CBC:  Recent Labs Lab 03/03/14 1243 03/04/14 0416 03/05/14 0427 03/07/14 0455  WBC 7.7 6.6 6.7 10.6*  NEUTROABS  6.4  --   --   --   HGB 9.2* 8.5* 8.6* 8.0*  HCT 27.3* 25.1* 26.8* 24.5*  MCV 93.2 93.3 96.4 95.7  PLT 278 252 258 287   Cardiac Enzymes: No results found for this basename: CKTOTAL, CKMB, CKMBINDEX, TROPONINI,  in the last 168 hours BNP (last 3 results) No results found for this basename: PROBNP,  in the last 8760 hours CBG: No results found for this basename: GLUCAP,  in the last 168 hours  Recent Results (from the past 240 hour(s))  URINE CULTURE     Status: None   Collection Time    03/04/14  3:58 PM      Result Value Ref Range Status   Specimen Description URINE, CATHETERIZED   Final   Special Requests NONE   Final   Culture  Setup Time     Final   Value: 03/04/2014 18:56     Performed at [place]     Final   Value: 10,000 COLONIES/ML     Performed at [facility]   Culture     Final   Value: STAPHYLOCOCCUS AUREUS     Note: RIFAMPIN AND GENTAMICIN SHOULD NOT BE USED AS SINGLE DRUGS FOR TREATMENT OF STAPH INFECTIONS.     Performed at [facility]   Report Status PENDING   Incomplete     Studies: Mr Attempted Daymon Larsen Report  03/06/2014   This examination belongs to an outside facility and is stored  here for comparison purposes only.  Contact the originating outside  institution for any associated report or interpretation.   Scheduled Meds: . dexamethasone  4 mg Intravenous 4 times per day  . feeding supplement (ENSURE COMPLETE)  237 mL Oral Q24H  . magnesium hydroxide  30 mL Oral Once  . OxyCODONE  10 mg Oral Q12H  . piperacillin-tazobactam (ZOSYN)  IV  3.375 g Intravenous Q8H  . polyethylene glycol  17 g Oral BID  . senna  2 tablet Oral Daily  . sodium chloride  3 mL Intravenous Q12H  . sorbitol, milk of mag, mineral oil, glycerin (SMOG) enema  960 mL Rectal Once  . traZODone  25 mg Oral QHS  . vancomycin  750 mg Intravenous Q24H   Continuous Infusions:   Principal Problem:   ARF (acute renal failure) Active  Problems:   Lung cancer   Non-small cell carcinoma of lung   Dehydration   Acute encephalopathy   Pleural effusion on right   Pneumonia   Hyperkalemia   Chronic indwelling Foley catheter   UTI (lower urinary tract infection)   Normocytic anemia   Protein-calorie malnutrition, severe    Time spent: 25 minutes    [facility]  [facility] Pager [phone] 7PM-7AM, please contact night-coverage at [email or web address], password [facility] 03/07/2014, 2:14 PM  LOS: 4 days

## 2014-03-07 NOTE — Progress Notes (Signed)
Clinical Social Work Department BRIEF PSYCHOSOCIAL ASSESSMENT 03/07/2014  Patient:  Jamie Munoz, Jamie Munoz     Account Number:  [id number]     Admit date:  03/03/2014  Clinical Social Worker:  Jamie Munoz  Date/Time:  03/07/2014 03:07 PM  Referred by:  Physician  Date Referred:  03/07/2014 Referred for  SNF Placement   Other Referral:   Interview type:  Patient Other interview type:   and nephew, Jamie Munoz at bedside    PSYCHOSOCIAL DATA Living Status:  ALONE Admitted from facility:   Level of care:   Primary support name:  Jamie Munoz (nephew) c#: [phone] Primary support relationship to patient:  FAMILY Degree of support available:   good    CURRENT CONCERNS Current Concerns  Post-Acute Placement   Other Concerns:    SOCIAL WORK ASSESSMENT / PLAN CSW received consult for SNF placement.   Assessment/plan status:  Information/Referral to [employer] Other assessment/ plan:   Information/referral to community resources:   CSW completed FL2 and faxed information out to [facility] SNFs - provided bed offers to nephew.    PATIENT'S/FAMILY'S RESPONSE TO PLAN OF CARE: Patient's nephew requested information re: discharge planning. Patient was living alone prior to hospitalization, family was trying to make arrangements to have someone be with the patient around the clock but understands the need for SNF before returning home. Nephew to discuss with family and tour facilities over the weekend. CSW will follow-up Monday with SNF decision.       Jamie Munoz, [facility] Clinical Social Worker cell #: [phone]

## 2014-03-07 NOTE — Evaluation (Signed)
Occupational Therapy Evaluation Patient Details Name: GERALDYN SHAIN MRN: [id number] DOB: 10-08-1926 Today's Date: 03/07/2014    History of Present Illness SHAIDA ROUTE is a 80 y.o. 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 Patient is accompanied by her nephews. Patient was originally diagnosed with lung cancer about 9 years ago. She has been on Tarceva for many years. Recently, her cancer has mutated. No alternative treatment is available currently. Over the last few months patient has had a decline. Imaging studies done in June, showed worsening in her lung mass with adrenal metastases. At that time MRI showed metastatic process in the spine. She hasn't received any radiation treatment. She was seen by her oncologist recently and plan was to repeat the MRI and to consider referral to radiation oncology. However, patient could not tolerate the MRI and one is planned for October with sedation. According to the family patient has been having worsening confusion over the last few weeks. She's had worsening ability to walk in the last week to 2 weeks   Clinical Impression   Pt presents to OT with decreased I with ADL activity due to problems below and will benefit from skilled OT to increase I with ADL activity and return to PLOF    Follow Up Recommendations  SNF    Equipment Recommendations  None recommended by OT           Mobility Bed Mobility Overal bed mobility: +2 for physical assistance;Needs Assistance Bed Mobility: Supine to Sit     Supine to sit: +2 for physical assistance;Max assist        Transfers Overall transfer level: Needs assistance Equipment used: Rolling walker (2 wheeled) Transfers: Sit to/from Stand Sit to Stand: +2 physical assistance;Mod assist                   ADL Overall  ADL's : Needs assistance/impaired                                       General ADL Comments: Pt needs significant A with ADL activity due to pain./ Limited ADL eval this session.      Vision        eyes closed majority of session                    Pertinent Vitals/Pain Pain Assessment: 0-10 Pain Score: 9  (pt stated "too much " after sitting EOB) Pain Location: ribs and shoulders     Hand Dominance     Extremity/Trunk Assessment Upper Extremity Assessment Upper Extremity Assessment: Generalized weakness           Communication Communication Communication: No difficulties   Cognition Arousal/Alertness: Awake/alert Behavior During Therapy: WFL for tasks assessed/performed Overall Cognitive Status: Within Functional Limits for tasks assessed                     General Comments               Home Living Family/patient expects to be discharged to:: Skilled nursing facility Living Arrangements: Alone   Type of Home: House   Entrance Stairs-Number of Steps: 1  Home Equipment: [place] - 2 wheels;Wheelchair - manual          Prior Functioning/Environment Level of Independence: Independent with assistive device(s)             OT Diagnosis: Generalized weakness   OT Problem List: Decreased strength;Decreased activity tolerance;Pain   OT Treatment/Interventions: Self-care/ADL training;DME and/or AE instruction;Patient/family education    OT Goals(Current goals can be found in the care plan section) Acute Rehab OT Goals Patient Stated Goal: did not state  OT Frequency: Min 2X/week   Barriers to D/C: Decreased caregiver support          Co-evaluation      yes        End of Session Equipment Utilized During Treatment: Rolling walker Nurse Communication: Mobility status  Activity Tolerance: Patient limited by pain Patient left: in bed;with family/visitor present;with call bell/phone  within reach   Time: 1225-1255 OT Time Calculation (min): 30 min Charges:  OT General Charges $OT Visit: 1 Procedure OT Evaluation $Initial OT Evaluation Tier I: 1 Procedure OT Treatments $Self Care/Home Management : 8-22 mins G-Codes:    Payton Mccallum D 29-Mar-2014, 1:03 PM

## 2014-03-07 NOTE — Progress Notes (Signed)
Patient is stil c/o of being uncomfortable. Pain medication was given. Patient's sister continues to come to the desk to ask if there is another medication that her sister can have. PCP was notified of patient's increased agitation.

## 2014-03-08 LAB — BASIC METABOLIC PANEL
ANION GAP: 12 (ref 5–15)
BUN: 35 mg/dL — ABNORMAL HIGH (ref 6–23)
CALCIUM: 7.2 mg/dL — AB (ref 8.4–10.5)
CO2: 23 mEq/L (ref 19–32)
Chloride: 102 mEq/L (ref 96–112)
Creatinine, Ser: 1.07 mg/dL (ref 0.50–1.10)
GFR, EST AFRICAN AMERICAN: 53 mL/min — AB (ref >90)
GFR, EST NON AFRICAN AMERICAN: 46 mL/min — AB (ref >90)
Glucose, Bld: 140 mg/dL — ABNORMAL HIGH (ref 70–99)
POTASSIUM: 4.2 meq/L (ref 3.7–5.3)
SODIUM: 137 meq/L (ref 137–147)

## 2014-03-08 LAB — CBC
HCT: 26.2 % — ABNORMAL LOW (ref 36.0–46.0)
Hemoglobin: 8.5 g/dL — ABNORMAL LOW (ref 12.0–15.0)
MCH: 30.9 pg (ref 26.0–34.0)
MCHC: 32.4 g/dL (ref 30.0–36.0)
MCV: 95.3 fL (ref 78.0–100.0)
Platelets: 275 10*3/uL (ref 150–400)
RBC: 2.75 MIL/uL — ABNORMAL LOW (ref 3.87–5.11)
RDW: 14.1 % (ref 11.5–15.5)
WBC: 13.8 10*3/uL — AB (ref 4.0–10.5)

## 2014-03-08 LAB — URINE CULTURE

## 2014-03-08 MED ORDER — MILK AND MOLASSES ENEMA
1.0000 | Freq: Once | RECTAL | Status: DC
  Filled 2014-03-08: qty 250

## 2014-03-08 MED ORDER — LORAZEPAM 2 MG/ML IJ SOLN
1.0000 mg | Freq: Once | INTRAMUSCULAR | Status: AC
  Administered 2014-03-08: 1 mg via INTRAVENOUS
  Filled 2014-03-08: qty 1

## 2014-03-08 MED ORDER — TRAZODONE HCL 50 MG PO TABS
50.0000 mg | ORAL_TABLET | Freq: Every day | ORAL | Status: DC
Start: 2014-03-08 — End: 2014-03-09
  Administered 2014-03-08: 50 mg via ORAL
  Filled 2014-03-08 (×2): qty 1

## 2014-03-08 MED ORDER — HALOPERIDOL LACTATE 5 MG/ML IJ SOLN
5.0000 mg | Freq: Once | INTRAMUSCULAR | Status: AC
  Administered 2014-03-08: 5 mg via INTRAVENOUS
  Filled 2014-03-08: qty 1

## 2014-03-08 NOTE — Progress Notes (Signed)
Pt transferred at this time to room 1335. Writer has given report to receiving nurse.  Family at bedside and aware of transfer.

## 2014-03-08 NOTE — Progress Notes (Signed)
Physical Therapy Treatment Patient Details Name: Jamie Munoz MRN: [id number] DOB: 1926/06/27 Today's Date: 03/08/2014    History of Present Illness Jamie Munoz is a 80 y.o. 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 Patient is accompanied by her nephews. Patient was originally diagnosed with lung cancer about 9 years ago. She has been on Tarceva for many years. Recently, her cancer has mutated. No alternative treatment is available currently. Over the last few months patient has had a decline. Imaging studies done in June, showed worsening in her lung mass with adrenal metastases. At that time MRI showed metastatic process in the spine. She hasn't received any radiation treatment. She was seen by her oncologist recently and plan was to repeat the MRI and to consider referral to radiation oncology. However, patient could not tolerate the MRI and one is planned for October with sedation. According to the family patient has been having worsening confusion over the last few weeks. She's had worsening ability to walk in the last week to 2 weeks    PT Comments    Pt OOB in recliner with 2 Nephews in room very attentive.  Pt clearly uncomfortable.  Premedicated pain meds prior to session.  Assisted pt out of her recliner to amb a limited distance to BR, however this task took nearly 6 min to complete 10 feet.  Very deconditioned and fagile.  Required much assist to prevent posterior lean and 75% VC's to advance gait speed and step length.  Assisted in BR.  Amb from BR to bed also with increased time.  Positioned in bed R sidelying with many pillows.  Follow Up Recommendations  SNF     Equipment Recommendations       Recommendations for Other Services       Precautions / Restrictions Precautions Precautions:  Fall Precaution Comments: L3 path Fx Restrictions Weight Bearing Restrictions: No    Mobility  Bed Mobility Overal bed mobility: Needs Assistance Bed Mobility: Sit to Supine       Sit to supine: +2 for physical assistance;Max assist   General bed mobility comments: assisted back to bed R sidelying + 2 assist and increased time to position to comfort  Transfers Overall transfer level: Needs assistance Equipment used: Rolling walker (2 wheeled) Transfers: Sit to/from Stand Sit to Stand: Max assist         General transfer comment: 75% VC's and increased assist around pt's waist/trunk so not to pull on B UE's esp R shoulder Hx Fx.  Also required trunk stability with stand to sit to control decend and decrease back pain. Assisted OOB and on/off toilet.  Ambulation/Gait Ambulation/Gait assistance: Max assist Ambulation Distance (Feet): 16 Feet (10 feet to BR then another 6 feet to bed) Assistive device: Rolling walker (2 wheeled) Gait Pattern/deviations: Step-to pattern;Decreased step length - right;Decreased step length - left;Trunk flexed;Narrow base of support Gait velocity: decreased x 2   General Gait Details: required increased, increased time and MAX support at waist/trunk with difficulty advancing L > R and decreased stance time on L.  Very deconditioned.  Poor balance and limited activity tolerance.     Stairs            Wheelchair Mobility    Modified Rankin (Stroke Patients Only)       Balance  Cognition                            Exercises      General Comments        Pertinent Vitals/Pain      Home Living                      Prior Function            PT Goals (current goals can now be found in the care plan section) Progress towards PT goals: Progressing toward goals    Frequency  Min 3X/week    PT Plan      Co-evaluation             End of Session  Equipment Utilized During Treatment: Gait belt Activity Tolerance: Patient limited by pain;Patient limited by fatigue Patient left: in bed;with call bell/phone within reach;with family/visitor present (2 nephews)     Time: 8032-1224 PT Time Calculation (min): 41 min  Charges:  $Gait Training: 8-22 mins $Therapeutic Activity: 23-37 mins                    G Codes:      Rica Koyanagi  PTA [facility]  Acute  Rehab Pager      [phone]

## 2014-03-08 NOTE — Progress Notes (Signed)
[facility] PROGRESS NOTE  Jamie Munoz KKX:[id number] DOB: [birth year] DOA: 03/03/2014 PCP: [name], MD   HPI/Subjective: Seen with nephew at bedside. Patient continues to have agitation at nighttime. I will increase the dose of Haldol. No significant success with the enema yesterday, I will repeat it.  Assessment/Plan:  Altered mental status -Somewhat improved after starting on Decadron, MRI brain shows brain metastasis.  -Radiation oncology has been consulted by Dr. Julien Nordmann for radiation.  -MRI to be done, patient unable to tolerate it.  Chronic pain -Patient is currently on OxyContin 10 mg twice a day at along with oxycodone 5 mg every 4 hours when necessary.  Brain metastasis with surrounding vasogenic edema -Brain metastasis with vasogenic edema, continue Decadron 4 mg IV every 6 hours. -Per radiation oncology whole brain radiation, SRS considered. -MRI repeated (9/17) because of previous motion artifact, patient couldn't stay still even with 2 mg of Ativan.  ? Healthcare associated pneumonia -Patient started on vancomycin and Zosyn, and will switch antibiotics to levofloxacin  ? UTI -Patient has chronic indwelling Foley catheter, urine culture has been obtained.  -Urine culture showed insignificant colony count for staph aureus (likely contamination). -Urine turned red, likely secondary to minimal injury from pulling the catheter while she was agitated. -Heparin discontinued, urine is clear and.  Non-small cell lung cancer- patient has been followed by oncology as outpatient, radiation oncology consulted for possible radiation to the brain. Discussed with Dr Inda Merlin.  Acute on chronic kidney disease -Baseline creatinine is 1.3, presented with creatinine of 1.9. -Creatinine is improving, today is 1.0  Chronic anemia- secondary to anemia of chronic disease, hemoglobin is 8.5 today. Follow CBC in a.m.  Sundowning -Patient sister described what appears  to be sundowning and agitation at nighttime. -Patient given trazodone to help with sleep, Haldol as needed for agitation  DVT prophylaxis-SCDs  Code Status: DO NOT RESUSCITATE Family Communication: Discussed with patient's nephew at bedside Disposition Plan: To be decided   Consultants:  Oncology  Radiation oncology  Procedures:  None  Antibiotics: Vancomycin 9/14 Zosyn 9/14    Objective: Filed Vitals:   03/08/14 0548  BP: 160/78  Pulse: 123  Temp: 98.1 F (36.7 C)  Resp: 20    Intake/Output Summary (Last 24 hours) at 03/08/14 1141 Last data filed at 03/08/14 0700  Gross per 24 hour  Intake    270 ml  Output    527 ml  Net   -257 ml   Filed Weights   03/03/14 1900  Weight: 53.978 kg (119 lb)    Exam:  Physical Exam: Head: Normocephalic, atraumatic.  Lungs: Normal respiratory effort. B/L Clear to auscultation, no crackles or wheezes.  Heart: Regular RR. S1 and S2 normal  Abdomen: BS normoactive. Soft, Nondistended, non-tender.  Extremities: No pretibial edema, no erythema   Data Reviewed: Basic Metabolic Panel:  Recent Labs Lab 03/03/14 2340 03/04/14 0416 03/05/14 0428 03/07/14 0455 03/08/14 0425  NA 132* 135* 138 138 137  K 5.8* 4.9 4.6 4.2 4.2  CL 97 99 103 102 102  CO2 23 23 23 24 23   GLUCOSE 102* 121* 154* 164* 140*  BUN 53* 49* 43* 38* 35*  CREATININE 1.74* 1.69* 1.42* 1.24* 1.07  CALCIUM 8.6 8.1* 7.6* 7.2* 7.2*   Liver Function Tests:  Recent Labs Lab 03/03/14 1243 03/04/14 0416 03/05/14 0428  AST 40* 37 53*  ALT 21 20 32  ALKPHOS 118* 108 123*  BILITOT 0.9 0.7 0.5  PROT 6.0 5.5* 5.5*  ALBUMIN 2.2*  1.8* 1.8*    Recent Labs Lab 03/03/14 1243  LIPASE 16   No results found for this basename: AMMONIA,  in the last 168 hours CBC:  Recent Labs Lab 03/03/14 1243 03/04/14 0416 03/05/14 0427 03/07/14 0455 03/08/14 0425  WBC 7.7 6.6 6.7 10.6* 13.8*  NEUTROABS 6.4  --   --   --   --   HGB 9.2* 8.5* 8.6* 8.0* 8.5*   HCT 27.3* 25.1* 26.8* 24.5* 26.2*  MCV 93.2 93.3 96.4 95.7 95.3  PLT 278 252 258 287 275   Cardiac Enzymes: No results found for this basename: CKTOTAL, CKMB, CKMBINDEX, TROPONINI,  in the last 168 hours BNP (last 3 results) No results found for this basename: PROBNP,  in the last 8760 hours CBG: No results found for this basename: GLUCAP,  in the last 168 hours  Recent Results (from the past 240 hour(s))  URINE CULTURE     Status: None   Collection Time    03/04/14  3:58 PM      Result Value Ref Range Status   Specimen Description URINE, CATHETERIZED   Final   Special Requests NONE   Final   Culture  Setup Time     Final   Value: 03/04/2014 18:56     Performed at [place]     Final   Value: 10,000 COLONIES/ML     Performed at [facility]   Culture     Final   Value: STAPHYLOCOCCUS AUREUS     Note: RIFAMPIN AND GENTAMICIN SHOULD NOT BE USED AS SINGLE DRUGS FOR TREATMENT OF STAPH INFECTIONS.     Performed at [facility]   Report Status PENDING   Incomplete     Studies: Mr Attempted Daymon Larsen Report  03/06/2014   This examination belongs to an outside facility and is stored  here for comparison purposes only.  Contact the originating outside  institution for any associated report or interpretation.   Scheduled Meds: . dexamethasone  4 mg Intravenous 4 times per day  . feeding supplement (ENSURE COMPLETE)  237 mL Oral Q24H  . levofloxacin  250 mg Oral Daily  . magnesium hydroxide  30 mL Oral Once  . OxyCODONE  10 mg Oral Q12H  . polyethylene glycol  17 g Oral BID  . senna  2 tablet Oral Daily  . sodium chloride  3 mL Intravenous Q12H  . traZODone  25 mg Oral QHS   Continuous Infusions:   Principal Problem:   ARF (acute renal failure) Active Problems:   Lung cancer   Non-small cell carcinoma of lung   Dehydration   Acute encephalopathy   Pleural effusion on right   Pneumonia   Hyperkalemia   Chronic indwelling Foley  catheter   UTI (lower urinary tract infection)   Normocytic anemia   Protein-calorie malnutrition, severe    Time spent: 25 minutes    [facility]  [facility] Pager [phone] 7PM-7AM, please contact night-coverage at [email or web address], password [facility] 03/08/2014, 11:41 AM  LOS: 5 days

## 2014-03-08 NOTE — Plan of Care (Signed)
Problem: Phase III Progression Outcomes Goal: Voiding independently Outcome: Not Met (add Reason) Chronic foley

## 2014-03-09 LAB — BASIC METABOLIC PANEL
Anion gap: 13 (ref 5–15)
BUN: 36 mg/dL — ABNORMAL HIGH (ref 6–23)
CO2: 23 mEq/L (ref 19–32)
CREATININE: 1.02 mg/dL (ref 0.50–1.10)
Calcium: 7.2 mg/dL — ABNORMAL LOW (ref 8.4–10.5)
Chloride: 101 mEq/L (ref 96–112)
GFR calc non Af Amer: 49 mL/min — ABNORMAL LOW (ref >90)
GFR, EST AFRICAN AMERICAN: 56 mL/min — AB (ref >90)
Glucose, Bld: 120 mg/dL — ABNORMAL HIGH (ref 70–99)
POTASSIUM: 4.5 meq/L (ref 3.7–5.3)
SODIUM: 137 meq/L (ref 137–147)

## 2014-03-09 LAB — CBC
HCT: 26.6 % — ABNORMAL LOW (ref 36.0–46.0)
Hemoglobin: 8.9 g/dL — ABNORMAL LOW (ref 12.0–15.0)
MCH: 31 pg (ref 26.0–34.0)
MCHC: 33.5 g/dL (ref 30.0–36.0)
MCV: 92.7 fL (ref 78.0–100.0)
Platelets: 254 10*3/uL (ref 150–400)
RBC: 2.87 MIL/uL — AB (ref 3.87–5.11)
RDW: 13.8 % (ref 11.5–15.5)
WBC: 15.9 10*3/uL — ABNORMAL HIGH (ref 4.0–10.5)

## 2014-03-09 MED ORDER — CLONAZEPAM 0.5 MG PO TABS
0.5000 mg | ORAL_TABLET | Freq: Two times a day (BID) | ORAL | Status: DC
  Administered 2014-03-09 – 2014-03-10 (×4): 0.5 mg via ORAL
  Filled 2014-03-09 (×4): qty 1

## 2014-03-09 MED ORDER — TRAZODONE HCL 100 MG PO TABS
100.0000 mg | ORAL_TABLET | Freq: Every day | ORAL | Status: DC
Start: 2014-03-09 — End: 2014-03-11
  Administered 2014-03-09: 100 mg via ORAL
  Filled 2014-03-09 (×3): qty 1

## 2014-03-09 MED ORDER — DOXYCYCLINE HYCLATE 100 MG PO TABS
100.0000 mg | ORAL_TABLET | Freq: Two times a day (BID) | ORAL | Status: DC
Start: 2014-03-09 — End: 2014-03-11
  Administered 2014-03-09 – 2014-03-10 (×4): 100 mg via ORAL
  Filled 2014-03-09 (×6): qty 1

## 2014-03-09 NOTE — Progress Notes (Signed)
[facility] PROGRESS NOTE  Jamie Munoz INO:[id number] DOB: 1927/01/07 DOA: 03/03/2014 PCP: [name], MD   HPI/Subjective: Seen with her niece at bedside. Patient continues to be very restless especially at night. Had bowel movement after enema yesterday.  Assessment/Plan:  Altered mental status -Somewhat improved after starting on Decadron, MRI brain shows brain metastasis.  -Radiation oncology has been consulted by Dr. Julien Nordmann for radiation.  -MRI to be done, patient unable to tolerate it.  Chronic pain -Patient is currently on OxyContin 10 mg twice a day at along with oxycodone 5 mg every 4 hours when necessary.  Brain metastasis with surrounding vasogenic edema -Brain metastasis with vasogenic edema, continue Decadron 4 mg IV every 6 hours. -Per radiation oncology whole brain radiation, SRS considered. -MRI repeated (9/17) because of previous motion artifact, patient couldn't stay still even with 2 mg of Ativan.  ? Healthcare associated pneumonia -Patient started on vancomycin and Zosyn, and will switch antibiotics to levofloxacin  ? UTI -Patient has chronic indwelling Foley catheter, urine culture has been obtained.  -Urine culture showed insignificant colony count for staph aureus (likely contamination). -Urine turned red, likely secondary to minimal injury from pulling the catheter while she was agitated. -Heparin discontinued, urine is clear and.  Non-small cell lung cancer- patient has been followed by oncology as outpatient, radiation oncology consulted for possible radiation to the brain. Discussed with Dr Inda Merlin.  Acute on chronic kidney disease -Baseline creatinine is 1.3, presented with creatinine of 1.9. -Creatinine is improving, today is 1.0  Chronic anemia- secondary to anemia of chronic disease, hemoglobin is 8.5 today. Follow CBC in a.m.  Sundowning -Patient sister described what appears to be sundowning and agitation at  nighttime. -Patient given trazodone to help with sleep, Haldol as needed for agitation  DVT prophylaxis-SCDs  Code Status: DO NOT RESUSCITATE Family Communication: Discussed with patient's nephew at bedside Disposition Plan: To be decided   Consultants:  Oncology  Radiation oncology  Procedures:  None  Antibiotics: Vancomycin 9/14 Zosyn 9/14    Objective: Filed Vitals:   03/09/14 0547  BP: 162/62  Pulse: 99  Temp: 98 F (36.7 C)  Resp: 20    Intake/Output Summary (Last 24 hours) at 03/09/14 1233 Last data filed at 03/09/14 0416  Gross per 24 hour  Intake    250 ml  Output    300 ml  Net    -50 ml   Filed Weights   03/03/14 1900 03/08/14 1540  Weight: 53.978 kg (119 lb) 57.5 kg (126 lb 12.2 oz)    Exam:  Physical Exam: Head: Normocephalic, atraumatic.  Lungs: Normal respiratory effort. B/L Clear to auscultation, no crackles or wheezes.  Heart: Regular RR. S1 and S2 normal  Abdomen: BS normoactive. Soft, Nondistended, non-tender.  Extremities: No pretibial edema, no erythema   Data Reviewed: Basic Metabolic Panel:  Recent Labs Lab 03/04/14 0416 03/05/14 0428 03/07/14 0455 03/08/14 0425 03/09/14 0530  NA 135* 138 138 137 137  K 4.9 4.6 4.2 4.2 4.5  CL 99 103 102 102 101  CO2 23 23 24 23 23   GLUCOSE 121* 154* 164* 140* 120*  BUN 49* 43* 38* 35* 36*  CREATININE 1.69* 1.42* 1.24* 1.07 1.02  CALCIUM 8.1* 7.6* 7.2* 7.2* 7.2*   Liver Function Tests:  Recent Labs Lab 03/03/14 1243 03/04/14 0416 03/05/14 0428  AST 40* 37 53*  ALT 21 20 32  ALKPHOS 118* 108 123*  BILITOT 0.9 0.7 0.5  PROT 6.0 5.5* 5.5*  ALBUMIN 2.2*  1.8* 1.8*    Recent Labs Lab 03/03/14 1243  LIPASE 16   No results found for this basename: AMMONIA,  in the last 168 hours CBC:  Recent Labs Lab 03/03/14 1243 03/04/14 0416 03/05/14 0427 03/07/14 0455 03/08/14 0425 03/09/14 0530  WBC 7.7 6.6 6.7 10.6* 13.8* 15.9*  NEUTROABS 6.4  --   --   --   --   --   HGB  9.2* 8.5* 8.6* 8.0* 8.5* 8.9*  HCT 27.3* 25.1* 26.8* 24.5* 26.2* 26.6*  MCV 93.2 93.3 96.4 95.7 95.3 92.7  PLT 278 252 258 287 275 254   Cardiac Enzymes: No results found for this basename: CKTOTAL, CKMB, CKMBINDEX, TROPONINI,  in the last 168 hours BNP (last 3 results) No results found for this basename: PROBNP,  in the last 8760 hours CBG: No results found for this basename: GLUCAP,  in the last 168 hours  Recent Results (from the past 240 hour(s))  URINE CULTURE     Status: None   Collection Time    03/04/14  3:58 PM      Result Value Ref Range Status   Specimen Description URINE, CATHETERIZED   Final   Special Requests NONE   Final   Culture  Setup Time     Final   Value: 03/04/2014 18:56     Performed at [place]     Final   Value: 10,000 COLONIES/ML     Performed at [facility]   Culture     Final   Value: METHICILLIN RESISTANT STAPHYLOCOCCUS AUREUS     Note: RIFAMPIN AND GENTAMICIN SHOULD NOT BE USED AS SINGLE DRUGS FOR TREATMENT OF STAPH INFECTIONS.     Performed at [facility]   Report Status 03/08/2014 FINAL   Final   Organism ID, Bacteria METHICILLIN RESISTANT STAPHYLOCOCCUS AUREUS   Final     Studies: No results found.  Scheduled Meds: . dexamethasone  4 mg Intravenous 4 times per day  . feeding supplement (ENSURE COMPLETE)  237 mL Oral Q24H  . levofloxacin  250 mg Oral Daily  . milk and molasses  1 enema Rectal Once  . OxyCODONE  10 mg Oral Q12H  . polyethylene glycol  17 g Oral BID  . senna  2 tablet Oral Daily  . sodium chloride  3 mL Intravenous Q12H  . traZODone  50 mg Oral QHS   Continuous Infusions:   Principal Problem:   ARF (acute renal failure) Active Problems:   Lung cancer   Non-small cell carcinoma of lung   Dehydration   Acute encephalopathy   Pleural effusion on right   Pneumonia   Hyperkalemia   Chronic indwelling Foley catheter   UTI (lower urinary tract infection)   Normocytic  anemia   Protein-calorie malnutrition, severe    Time spent: 25 minutes    [facility]  [facility] Pager [phone] 7PM-7AM, please contact night-coverage at [email or web address], password [facility] 03/09/2014, 12:33 PM  LOS: 6 days

## 2014-03-10 ENCOUNTER — Ambulatory Visit
Admit: 2014-03-10 | Discharge: 2014-03-10 | Disposition: A | Discharge status: Home / Self Care | Payer: Medicare Other | Attending: Radiation Oncology | Admitting: Radiation Oncology

## 2014-03-10 ENCOUNTER — Encounter: Payer: Self-pay | Admitting: Radiation Oncology

## 2014-03-10 DIAGNOSIS — C3410 Malignant neoplasm of upper lobe, unspecified bronchus or lung: Secondary | ICD-10-CM

## 2014-03-10 MED ORDER — MUPIROCIN 2 % EX OINT
TOPICAL_OINTMENT | Freq: Two times a day (BID) | CUTANEOUS | Status: DC
  Administered 2014-03-10: 22:00:00 via NASAL
  Filled 2014-03-10: qty 22

## 2014-03-10 NOTE — Progress Notes (Signed)
Pt turned from side to side to back q 3 hr. Family at bedside all day. Pt becomes restless when she is in pain. Dilaudid 0.5mg  given IV. Effective in relieving pain. Foley patent, draining yellow urine. Pt follows commands.

## 2014-03-10 NOTE — Progress Notes (Signed)
CSW continuing to follow.  CSW received notification from RN that pt nephew requesting to speak with CSW.   CSW met with pt nephew at bedside. CSW introduced self and pt transferred from another unit. CSW provided supportive counseling as pt nephew discussed his concerns surrounding pt pain and discomfort. Pt nephew stated that pt oncologist felt that radiation would help pt comfort and pain, but pt nephew reports that pt has declined since decision to start radiation and is very uncomfortable, restless, and confused and pt family is very hopeful that pt pain can be addressed. Pt nephew shared that initially plan was for rehab at SNF, but pt nephew states that he does not see at this time how pt would be able to participate in rehab with the level of pain she is in. CSW discussed that it may be beneficial for pt family to meet with oncologist and or palliative medicine team again to discuss goals of care. Pt nephew is very eager to discuss goals of care and very concerned about pt pain. Support provided.  CSW contacted attending MD, Dr. Hartford Poli and shared pt nephews concerns and wishes to re-address goals and pt pain management. Per attending MD, attending MD plans to contact pt oncologist.  CSW updated pt nephew and discussed with pt nephew that it may be beneficial for pt nephew to contact the cancer center and leave a message with oncologist nurse to have oncologist contact pt nephew to further discuss.   CSW to continue to follow to provide support and assist with disposition planning as appropriate.  Alison Murray, MSW, [place] Work [phone]

## 2014-03-10 NOTE — Progress Notes (Signed)
[facility] PROGRESS NOTE  Jamie Munoz IBB:[id number] DOB: [birth year] DOA: 03/03/2014 PCP: [name], MD   HPI/Subjective: Seen with marked her POA at bedside. Patient not verbally responsive, family agreed to palliative/hospice. Palliative medicine team called.  Assessment/Plan:  Altered mental status -Somewhat improved after starting on Decadron, MRI brain shows brain metastasis.  -Radiation oncology has been consulted by Dr. Julien Nordmann for radiation.  -MRI to be done, patient unable to tolerate it. -Patient is not verbally responsive now, oncology recommending hospice care  Chronic pain -Patient is currently on OxyContin 10 mg twice a day at along with oxycodone 5 mg every 4 hours when necessary.  Brain metastasis with surrounding vasogenic edema -Brain metastasis with vasogenic edema, continue Decadron 4 mg IV every 6 hours. -Per radiation oncology whole brain radiation, SRS considered. -MRI repeated (9/17) because of previous motion artifact, patient couldn't stay still even with 2 mg of Ativan.  ? Healthcare associated pneumonia -Patient started on vancomycin and Zosyn, and will switch antibiotics to levofloxacin  ? UTI -Patient has chronic indwelling Foley catheter, urine culture has been obtained.  -Urine culture showed insignificant colony count for staph aureus (likely contamination). -Urine turned red, likely secondary to minimal injury from pulling the catheter while she was agitated. -Heparin discontinued, urine is clear and.  Non-small cell lung cancer- patient has been followed by oncology as outpatient, radiation oncology consulted for possible radiation to the brain. Discussed with Dr Inda Merlin.  Acute on chronic kidney disease -Baseline creatinine is 1.3, presented with creatinine of 1.9. -Creatinine is improving, today is 1.0  Chronic anemia- secondary to anemia of chronic disease, hemoglobin is 8.5 today. Follow CBC in  a.m.  Sundowning -Patient sister described what appears to be sundowning and agitation at nighttime. -Patient given trazodone to help with sleep, Haldol as needed for agitation  DVT prophylaxis-SCDs  Code Status: DO NOT RESUSCITATE Family Communication: Discussed with patient's nephew at bedside Disposition Plan: Likely to be discharged to hospice home   Consultants:  Oncology  Radiation oncology  Procedures:  None  Antibiotics: Vancomycin 9/14 Zosyn 9/14 On doxycycline    Objective: Filed Vitals:   03/10/14 1408  BP: 114/53  Pulse: 94  Temp: 98.6 F (37 C)  Resp: 16    Intake/Output Summary (Last 24 hours) at 03/10/14 1657 Last data filed at 03/10/14 0459  Gross per 24 hour  Intake      0 ml  Output    500 ml  Net   -500 ml   Filed Weights   03/03/14 1900 03/08/14 1540  Weight: 53.978 kg (119 lb) 57.5 kg (126 lb 12.2 oz)    Exam:  Physical Exam: Head: Normocephalic, atraumatic.  Lungs: Normal respiratory effort. B/L Clear to auscultation, no crackles or wheezes.  Heart: Regular RR. S1 and S2 normal  Abdomen: BS normoactive. Soft, Nondistended, non-tender.  Extremities: No pretibial edema, no erythema   Data Reviewed: Basic Metabolic Panel:  Recent Labs Lab 03/04/14 0416 03/05/14 0428 03/07/14 0455 03/08/14 0425 03/09/14 0530  NA 135* 138 138 137 137  K 4.9 4.6 4.2 4.2 4.5  CL 99 103 102 102 101  CO2 23 23 24 23 23   GLUCOSE 121* 154* 164* 140* 120*  BUN 49* 43* 38* 35* 36*  CREATININE 1.69* 1.42* 1.24* 1.07 1.02  CALCIUM 8.1* 7.6* 7.2* 7.2* 7.2*   Liver Function Tests:  Recent Labs Lab 03/04/14 0416 03/05/14 0428  AST 37 53*  ALT 20 32  ALKPHOS 108 123*  BILITOT 0.7  0.5  PROT 5.5* 5.5*  ALBUMIN 1.8* 1.8*   No results found for this basename: LIPASE, AMYLASE,  in the last 168 hours No results found for this basename: AMMONIA,  in the last 168 hours CBC:  Recent Labs Lab 03/04/14 0416 03/05/14 0427 03/07/14 0455  03/08/14 0425 03/09/14 0530  WBC 6.6 6.7 10.6* 13.8* 15.9*  HGB 8.5* 8.6* 8.0* 8.5* 8.9*  HCT 25.1* 26.8* 24.5* 26.2* 26.6*  MCV 93.3 96.4 95.7 95.3 92.7  PLT 252 258 287 275 254   Cardiac Enzymes: No results found for this basename: CKTOTAL, CKMB, CKMBINDEX, TROPONINI,  in the last 168 hours BNP (last 3 results) No results found for this basename: PROBNP,  in the last 8760 hours CBG: No results found for this basename: GLUCAP,  in the last 168 hours  Recent Results (from the past 240 hour(s))  URINE CULTURE     Status: None   Collection Time    03/04/14  3:58 PM      Result Value Ref Range Status   Specimen Description URINE, CATHETERIZED   Final   Special Requests NONE   Final   Culture  Setup Time     Final   Value: 03/04/2014 18:56     Performed at [place]     Final   Value: 10,000 COLONIES/ML     Performed at [facility]   Culture     Final   Value: METHICILLIN RESISTANT STAPHYLOCOCCUS AUREUS     Note: RIFAMPIN AND GENTAMICIN SHOULD NOT BE USED AS SINGLE DRUGS FOR TREATMENT OF STAPH INFECTIONS.     Performed at [facility]   Report Status 03/08/2014 FINAL   Final   Organism ID, Bacteria METHICILLIN RESISTANT STAPHYLOCOCCUS AUREUS   Final     Studies: No results found.  Scheduled Meds: . clonazePAM  0.5 mg Oral BID  . dexamethasone  4 mg Intravenous 4 times per day  . doxycycline  100 mg Oral Q12H  . feeding supplement (ENSURE COMPLETE)  237 mL Oral Q24H  . milk and molasses  1 enema Rectal Once  . OxyCODONE  10 mg Oral Q12H  . polyethylene glycol  17 g Oral BID  . senna  2 tablet Oral Daily  . sodium chloride  3 mL Intravenous Q12H  . traZODone  100 mg Oral QHS   Continuous Infusions:   Principal Problem:   ARF (acute renal failure) Active Problems:   Lung cancer   Non-small cell carcinoma of lung   Dehydration   Acute encephalopathy   Pleural effusion on right   Pneumonia   Hyperkalemia   Chronic  indwelling Foley catheter   UTI (lower urinary tract infection)   Normocytic anemia   Protein-calorie malnutrition, severe    Time spent: 35 minutes    [facility]  [facility] Pager [phone] If 7PM-7AM, please contact night-coverage at [email or web address], password [facility] 03/10/2014, 4:57 PM  LOS: 7 days

## 2014-03-10 NOTE — Progress Notes (Signed)
Chaplain referred to patient by staff.  Visited with her sister present.  Patient is a very nice soft spoken lady dealing with the issues of her cancer.  She was not comfortable in the bed, physically, and frustrated at being there.  She kept moving her arms around, and though coupled with weakness, it was a sort of "what am I doing here" message.  Her sister was frustrated for her and with her.  Chaplain brought the staff in to see what could be done for the physical aspects of her comfort.  That was her first concern of what could actually be done.  Chaplain really felt a real sense of privilege to be at this patient's bedside.  Spent about 15 minutes with her listening to some of her frustration.  Jamie Munoz, Chaplain Pager: [phone]

## 2014-03-10 NOTE — Progress Notes (Signed)
[facility] Therapy Treatment Record Phone [phone]   Radiation Therapy was administered to Jamie Munoz on: 03/10/2014  9:59 AM and was treatment # 3 out of a planned course of 12 treatments.

## 2014-03-10 NOTE — Progress Notes (Signed)
PT Cancellation Note  ___Treatment cancelled today due to medical issues with patient which prohibited therapy  ___ Treatment cancelled today due to patient receiving procedure or test   ___ Treatment cancelled today due to patient's refusal to participate   _X_ Treatment cancelled today due to......[facility] Radiation Tx am then pm nephew stated "just now got comfortable" and "uanble to tolerate"  Stated they are now looking into [place]  PTA [facility]  Acute  Rehab Pager      [phone]

## 2014-03-10 NOTE — Progress Notes (Signed)
Subjective: The patient is seen and examined today. Her nephew at the bedside. She is very sleepy today and unresponsive to verbal stimuli. Her condition has deteriorated over the last few days. She was started on palliative radiotherapy last week but no improvement in her condition.   Objective: Vital signs in last 24 hours: Temp:  [94.6 F (34.8 C)-96.6 F (35.9 C)] 94.6 F (34.8 C) (09/21 0458) Pulse Rate:  [97-106] 97 (09/21 0458) Resp:  [16-24] 16 (09/21 0458) BP: (149-165)/(60-63) 149/60 mmHg (09/21 0458) SpO2:  [93 %-97 %] 97 % (09/21 0458)  Intake/Output from previous day: 09/20 0701 - 09/21 0700 In: -  Out: 800 [Urine:800] Intake/Output this shift:    General appearance: alert, fatigued and uncooperative Resp: clear to auscultation bilaterally Cardio: regular rate and rhythm, S1, S2 normal, no murmur, click, rub or gallop GI: soft, non-tender; bowel sounds normal; no masses,  no organomegaly Extremities: extremities normal, atraumatic, no cyanosis or edema  Lab Results:   Recent Labs  03/08/14 0425 03/09/14 0530  WBC 13.8* 15.9*  HGB 8.5* 8.9*  HCT 26.2* 26.6*  PLT 275 254   BMET  Recent Labs  03/08/14 0425 03/09/14 0530  NA 137 137  K 4.2 4.5  CL 102 101  CO2 23 23  GLUCOSE 140* 120*  BUN 35* 36*  CREATININE 1.07 1.02  CALCIUM 7.2* 7.2*    Studies/Results: No results found.  Medications: I have reviewed the patient's current medications.  CODE STATUS: No CODE BLUE  Assessment/Plan: 1) metastatic non-small cell lung cancer, adenocarcinoma with positive EGFR mutation as well as resistant mutation. The patient is currently on Tarceva. She was not eligible for a clinical trial with the 3rd generation EGFR tyrosine kinase.  Her condition is deteriorating. I strongly recommend for the patient palliative care and hospice at this point. She may benefit from discharge to hospice facility for end-of-life care. Her family are in agreement with the  current plan. 2) metastatic brain lesions: I don't see a need for any further radiation at this point. 3) CODE STATUS: No CODE BLUE. Thank you for taking good care of Jamie Munoz, I will continue to followup with you and assist in her management an as-needed basis.   LOS: 7 days    Tiaja Hagan K. 03/10/2014

## 2014-03-10 NOTE — Progress Notes (Signed)
I have met with Ms. Cui's son, [name], and we will meet in the morning 9/22 0800 am for GOC and options. Thank you for this consult.  Vinie Sill, NP Palliative Medicine Team Pager # [phone] (M-F 8a-5p) Team Phone # [phone] (Nights/Weekends)

## 2014-03-11 ENCOUNTER — Ambulatory Visit: Payer: Medicare Other

## 2014-03-11 DIAGNOSIS — Z515 Encounter for palliative care: Secondary | ICD-10-CM

## 2014-03-11 DIAGNOSIS — M310 Hypersensitivity angiitis: Secondary | ICD-10-CM

## 2014-03-11 DIAGNOSIS — D539 Nutritional anemia, unspecified: Secondary | ICD-10-CM

## 2014-03-11 MED ORDER — LORAZEPAM 2 MG/ML PO CONC: 1.0000 mg | ORAL | Status: AC | PRN

## 2014-03-11 MED ORDER — LORAZEPAM 0.5 MG PO TABS
0.5000 mg | ORAL_TABLET | ORAL | Status: DC | PRN
  Administered 2014-03-11: 1 mg via ORAL
  Filled 2014-03-11: qty 2

## 2014-03-11 MED ORDER — BISACODYL 10 MG RE SUPP: 10.0000 mg | Freq: Every day | RECTAL | Status: DC | PRN

## 2014-03-11 MED ORDER — HALOPERIDOL 2 MG PO TABS: 2.0000 mg | ORAL_TABLET | Freq: Two times a day (BID) | ORAL | Status: AC | PRN

## 2014-03-11 MED ORDER — HYDROMORPHONE HCL 1 MG/ML IJ SOLN
0.5000 mg | Freq: Once | INTRAMUSCULAR | Status: AC | PRN
  Administered 2014-03-11: 1 mg via INTRAVENOUS
  Filled 2014-03-11: qty 1

## 2014-03-11 MED ORDER — LIDOCAINE 5 % EX PTCH
1.0000 | MEDICATED_PATCH | CUTANEOUS | Status: DC
  Filled 2014-03-11: qty 1

## 2014-03-11 MED ORDER — CLONAZEPAM 0.5 MG PO TBDP: 0.5000 mg | ORAL_TABLET | Freq: Two times a day (BID) | ORAL | Status: DC

## 2014-03-11 MED ORDER — MORPHINE SULFATE (CONCENTRATE) 10 MG /0.5 ML PO SOLN
5.0000 mg | ORAL | Status: DC | PRN
  Administered 2014-03-11 (×2): 5 mg via ORAL
  Filled 2014-03-11 (×2): qty 0.5

## 2014-03-11 MED ORDER — BISACODYL 10 MG RE SUPP: 10.0000 mg | RECTAL | Status: AC | PRN

## 2014-03-11 MED ORDER — MORPHINE SULFATE (CONCENTRATE) 10 MG /0.5 ML PO SOLN: 10.0000 mg | ORAL | Status: DC | PRN

## 2014-03-11 MED ORDER — MORPHINE SULFATE (CONCENTRATE) 10 MG /0.5 ML PO SOLN: 10.0000 mg | ORAL | Status: AC | PRN

## 2014-03-11 NOTE — Progress Notes (Signed)
CSW received notification from [facility] that pt clinicals reviewed and pt appropriate for residential hospice placement and bed available today.   CSW notified MD, Dr. Hartford Poli. CSW notified PMT NP, Vinie Sill.   CSW discussed with pt nephew at bedside. Provided support and clarified questions regarding transition to residential hospice.  Alison Murray, MSW, [place] Work [phone]

## 2014-03-11 NOTE — Progress Notes (Signed)
Patient unable to swallow pills whole. Nurse attempted several times but pill was spit out.  Pills crushed and put in applesauce. Pt tolerates pills when crushed in applesauce. Jamie Munoz S

## 2014-03-11 NOTE — Progress Notes (Signed)
Pt received Q3 doses of pain medication overnight but awoke several times during the night with agitation. Pt given prn haldol for agitation but is still experiencing agitation in between pain doses. Genene Churn, RN

## 2014-03-11 NOTE — Care Management Note (Signed)
CARE MANAGEMENT NOTE 03/11/2014  Patient:  Jamie Munoz, Jamie Munoz   Account Number:  [id number]  Date Initiated:  03/04/2014  Documentation initiated by:  Dessa Phi  Subjective/Objective Assessment:   80 Y/O F ADMITTED W/ARF,AMS.HX:MET LUNG CA,METS [place].     Action/Plan:   FROM HOME.ACTIVE W/GENTIVA, & PCS-HELPING HANDS.   Anticipated DC Date:  03/08/2014   Anticipated DC Plan:  [place]  In-house referral  Clinical Social Worker      DC Planning Services  CM consult      [facility] Choice  Resumption Of Svcs/PTA Provider   Choice offered to / List presented to:             Status of service:  In process, will continue to follow Medicare Important Message given?  YES (If response is "NO", the following Medicare IM given date fields will be blank) Date Medicare IM given:  03/07/2014 Medicare IM given by:  [facility] Date Additional Medicare IM given:  03/11/2014 Additional Medicare IM given by:  [facility]  Discharge Disposition:    Per UR Regulation:  Reviewed for med. necessity/level of care/duration of stay  If discussed at [place] of Stay Meetings, dates discussed:   03/11/2014    Comments:  03/11/14 Marney Doctor RN,BSN,NCM [phone] Pt to DC to inpt hospice facility  03/07/14 KATHY MAHABIR RN,BSN NCM [phone] PT ORDERED.AWAIT RECOMMENDATIONS.  03/05/14 KATHY MAHABIR RN,BSN NCM [place] SEE PATIENT.AWAIT RECOMMENDATIONS.  03/04/14 KATHY MAHABIR RN,BSN NCM [phone] TC GENTIVA REP DEBBIE AWARE, & FOLLOWING.AWAIT FINAL HHC ORDERS.NEW BRAIN METS.XRT ONC CONS.WOULD RECOMMEND PT CONS WHEN APPROPRIATE.

## 2014-03-11 NOTE — Consult Note (Signed)
Patient [name]      DOB: [birth year]      JHE:[id number]     Consult Note from the Palliative Medicine Team at [facility] Requested by: Dr. Julien Nordmann     PCP: [name], MD Reason for Consultation: End of life, hospice options   Phone Number:[phone]  Assessment of patients Current state: I met today in the room with Jamie Munoz, nephew Jamie Munoz, and sister Jamie Munoz. 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 Brain mets are new diagnosis to them although they say she has had some periods of confusion for some months now. They discussed with Dr. Julien Nordmann and Dr. Tammi Klippel and were hopeful to try radiation to help with her pain symptoms but this has become too difficult with increased pain and agitation and sleep disturbance. Agitation is worse at nighttime. They both confirm that they have discussed with the rest of family and are interested in full comfort options for her and wish for her to not suffer and allow her to progress naturally to an inevitable death. 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 Family and friends will be able to more easily visit and support her there. They understand that hospice will be focused on comfort and they confirm they are not interested in continuing radiation, IV fluids, antibiotics but only what she needs to ensure her comfort. I will continue to follow and support.    Goals of Care: 1.  Code Status: DNR   2. Scope of Treatment: 1. Vital Signs: daily 2. Respiratory/Oxygen: for comfort 3. Nutritional Support/Tube Feeds: no  4. Antibiotics: no 5. IVF: no 6. Review of Medications to be discontinued: minimize for comfort 7. Labs: no  8. Telemetry: no 9. Consults: Social work for hospice placement.    4. Disposition: Hopeful for hospice facility.    3. Symptom  Management:   1. Anxiety/Agitation: Continue decadron 4 mg every 6 hours scheduled for vasogenic edema r/t brain mets. Lorazepam tablet SL 0.5-1 mg every 4 hours prn. Clonazepam disintegrating tablet 0.5 BID.  2. Pain: Roxanol 5 mg every hour prn pain/shortness of breath - may titrate dosage as needed. Dilaudid 0.5 mg IV every 3 hours prn.  3. Bowel Regimen: Dulcolax supp daily prn.  4. Delirium: Haldol 1 mg IV BID prn.  5. Nausea: Ondansetron prn.  6. Fever: Acetaminophen supp prn.   4. Psychosocial: Emotional support provided to patient and family at bedside.   5. Spiritual: Jamie Munoz says that their personal pastor who is also [name] pastor visited yesterday and has been following and supporting them.       Brief HPI: 80 yo female admitted with pain in right chest, weakness, worsening confusion for past few weeks. Recent history of non-small cell lung cancer with right lung mass and mets to spine causing neuropathic pain. She has required continued foley catheter d/t neurogenic bladder. She was tolerating treatment and fairly stable according to family with lung cancer ~9 years until the past few months there has been much decline with significant weight loss ~ 30 lbs 6 months. Now found to have brain mets with edema. She has increased pain and agitation in the context of her cancer progression. PMH significant for hypertension and LLE cellulitis.    ROS: + pain, + agitation, + sleep disturbance  PMH:  Past Medical History  Diagnosis Date  . Cancer   . Lung cancer   . Hypertension   . Cellulitis of left lower extremity 07/16/2012     PSH: Past Surgical History  Procedure Laterality Date  . Cholecystectomy    . Abdominal hysterectomy    . Breast surgery    . Vaginal sling     I have reviewed the FH and SH and  If appropriate update it with new information. Allergies  Allergen Reactions  . Codeine Shortness Of Breath   Scheduled Meds: . clonazePAM  0.5 mg Oral BID   . dexamethasone  4 mg Intravenous 4 times per day  . doxycycline  100 mg Oral Q12H  . feeding supplement (ENSURE COMPLETE)  237 mL Oral Q24H  . mupirocin ointment   Nasal BID  . sodium chloride  3 mL Intravenous Q12H  . traZODone  100 mg Oral QHS   Continuous Infusions:  PRN Meds:.acetaminophen, albuterol, haloperidol lactate, HYDROmorphone (DILAUDID) injection, LORazepam, morphine CONCENTRATE, ondansetron (ZOFRAN) IV, sodium chloride    BP 140/60  Pulse 94  Temp(Src) 97.6 F (36.4 C) (Axillary)  Resp 16  Ht $R'5\' 6"'CB$  (1.676 m)  Wt 57.5 kg (126 lb 12.2 oz)  BMI 20.47 kg/m2  SpO2 97%   PPS: 30% at best   Intake/Output Summary (Last 24 hours) at 03/11/14 0831 Last data filed at 03/10/14 1813  Gross per 24 hour  Intake      0 ml  Output    100 ml  Net   -100 ml   LBM: 03/08/14                       Physical Exam:  General: NAD, slightly restless, thin, frail HEENT: Temporal muscle wasting, no JVD, moist mucous membranes without exudate Chest: No labored breathing, symmetric CVS: RRR, S1 S2 Abdomen: Soft, NT, ND, +BS Ext: MAE, no edema, warm to touch, no cyanosis Neuro: Awake, alert, oriented to person, answers questions appropriately  Labs: CBC    Component Value Date/Time   WBC 15.9* 03/09/2014 0530   WBC 8.6 02/20/2014 1027   RBC 2.87* 03/09/2014 0530   RBC 2.93* 02/20/2014 1027   HGB 8.9* 03/09/2014 0530   HGB 8.9* 02/20/2014 1027   HCT 26.6* 03/09/2014 0530   HCT 28.8* 02/20/2014 1027   PLT 254 03/09/2014 0530   PLT 193 02/20/2014 1027   MCV 92.7 03/09/2014 0530   MCV 98.3 02/20/2014 1027   MCH 31.0 03/09/2014 0530   MCH 30.4 02/20/2014 1027   MCHC 33.5 03/09/2014 0530   MCHC 30.9* 02/20/2014 1027   RDW 13.8 03/09/2014 0530   RDW 15.0* 02/20/2014 1027   LYMPHSABS 0.4* 03/03/2014 1243   LYMPHSABS 0.5* 02/20/2014 1027   MONOABS 0.7 03/03/2014 1243   MONOABS 0.6 02/20/2014 1027   EOSABS 0.1 03/03/2014 1243   EOSABS 0.3 02/20/2014 1027   BASOSABS 0.0 03/03/2014 1243   BASOSABS 0.0  02/20/2014 1027    BMET    Component Value Date/Time   NA 137 03/09/2014 0530   NA 141 02/20/2014 1027   NA 139 09/02/2011 1406   K 4.5 03/09/2014 0530   K 4.4 02/20/2014 1027   K 3.9 09/02/2011 1406   CL 101 03/09/2014 0530   CL 105 10/02/2012 1056   CL 97* 09/02/2011 1406   CO2 23 03/09/2014 0530   CO2 22 02/20/2014 1027   CO2 29 09/02/2011 1406   GLUCOSE 120* 03/09/2014 0530  GLUCOSE 116 02/20/2014 1027   GLUCOSE 112* 10/02/2012 1056   GLUCOSE 109 09/02/2011 1406   BUN 36* 03/09/2014 0530   BUN 20.9 02/20/2014 1027   BUN 18 09/02/2011 1406   CREATININE 1.02 03/09/2014 0530   CREATININE 1.3* 02/20/2014 1027   CREATININE 1.2 09/02/2011 1406   CALCIUM 7.2* 03/09/2014 0530   CALCIUM 7.1* 02/20/2014 1027   CALCIUM 8.0 09/02/2011 1406   GFRNONAA 49* 03/09/2014 0530   GFRAA 56* 03/09/2014 0530    CMP     Component Value Date/Time   NA 137 03/09/2014 0530   NA 141 02/20/2014 1027   NA 139 09/02/2011 1406   K 4.5 03/09/2014 0530   K 4.4 02/20/2014 1027   K 3.9 09/02/2011 1406   CL 101 03/09/2014 0530   CL 105 10/02/2012 1056   CL 97* 09/02/2011 1406   CO2 23 03/09/2014 0530   CO2 22 02/20/2014 1027   CO2 29 09/02/2011 1406   GLUCOSE 120* 03/09/2014 0530   GLUCOSE 116 02/20/2014 1027   GLUCOSE 112* 10/02/2012 1056   GLUCOSE 109 09/02/2011 1406   BUN 36* 03/09/2014 0530   BUN 20.9 02/20/2014 1027   BUN 18 09/02/2011 1406   CREATININE 1.02 03/09/2014 0530   CREATININE 1.3* 02/20/2014 1027   CREATININE 1.2 09/02/2011 1406   CALCIUM 7.2* 03/09/2014 0530   CALCIUM 7.1* 02/20/2014 1027   CALCIUM 8.0 09/02/2011 1406   PROT 5.5* 03/05/2014 0428   PROT 6.1* 02/20/2014 1027   PROT 6.7 09/02/2011 1406   ALBUMIN 1.8* 03/05/2014 0428   ALBUMIN 2.8* 02/20/2014 1027   AST 53* 03/05/2014 0428   AST 25 02/20/2014 1027   AST 25 09/02/2011 1406   ALT 32 03/05/2014 0428   ALT 13 02/20/2014 1027   ALT 20 09/02/2011 1406   ALKPHOS 123* 03/05/2014 0428   ALKPHOS 72 02/20/2014 1027   ALKPHOS 71 09/02/2011 1406   BILITOT 0.5 03/05/2014 0428   BILITOT 0.39  02/20/2014 1027   BILITOT 0.60 09/02/2011 1406   GFRNONAA 49* 03/09/2014 0530   GFRAA 56* 03/09/2014 0530     Time In Time Out Total Time Spent with Patient Total Overall Time  0800 0850 38min 71min    Greater than 50%  of this time was spent counseling and coordinating care related to the above assessment and plan.  Vinie Sill, NP Palliative Medicine Team Pager # [phone] (M-F 8a-5p) Team Phone # [phone] (Nights/Weekends)

## 2014-03-11 NOTE — Discharge Summary (Signed)
Physician Discharge Summary  Jamie Munoz LNL:[id number] DOB: 06-01-1927 DOA: 03/03/2014  PCP: [name], MD  Admit date: 03/03/2014 Discharge date: 03/11/2014  Time spent: 40 minutes  Recommendations for Outpatient Follow-up:  1. Followup with nursing home MD  Discharge Diagnoses:  Principal Problem:   ARF (acute renal failure) Active Problems:   Lung cancer   Non-small cell carcinoma of lung   Dehydration   Acute encephalopathy   Pleural effusion on right   Pneumonia   Hyperkalemia   Chronic indwelling Foley catheter   UTI (lower urinary tract infection)   Normocytic anemia   Protein-calorie malnutrition, severe   Palliative care encounter   Discharge Condition: Fair   Diet recommendation: Regular diet for pleasure  Filed Weights   03/03/14 1900 03/08/14 1540  Weight: 53.978 kg (119 lb) 57.5 kg (126 lb 12.2 oz)    History of present illness:  Jamie Munoz is a 80 y.o. 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 Patient is accompanied by her nephews. Patient was originally diagnosed with lung cancer about 9 years ago. She has been on Tarceva for many years. Recently, her cancer has mutated. No alternative treatment is available currently. Over the last few months patient has had a decline. Imaging studies done in June, showed worsening in her lung mass with adrenal metastases. At that time MRI showed metastatic process in the spine. She hasn't received any radiation treatment. She was seen by her oncologist recently and plan was to repeat the MRI and to consider referral to radiation oncology. However, patient could not tolerate the MRI and one is planned for October with sedation. According to the family patient has been having worsening confusion over the last few weeks. She's had worsening  ability to walk in the last week to 2 weeks. Her oral intake has been poor. She's also had a cough for the last 2 days with mucus expectoration. They have also noticed some blood in the sputum. She has found it difficult to swallow food. No nausea, vomiting, no abdominal pain. She's lost about 30 lbs in last 6 months. She also is complaining of pain in her right chest area since this morning. Patient was not able to provide any history due to her confusion.  Hospital Course:   Altered mental status  -Somewhat improved after starting on Decadron, MRI brain shows brain metastasis.  -Radiation oncology has been consulted by Dr. Julien Nordmann for radiation.  -MRI to be done, patient unable to tolerate it.  -Patient is not verbally responsive now, oncology recommending hospice care. -After brief improvement patient was back and restless, lethargic and less responsive state. -This is could be multifactorial secondary to brain metastases, medications and worsening nutritional status. -Rediscussed with oncology and family, both agreed to full comfort measures.   Chronic pain  -Cancer related pain, patient was on OxyContin 10 mg twice a day. -On discharge Roxanol 10 mg as needed every 2 hours. For pain and agitation  Brain metastasis with surrounding vasogenic edema  -Brain metastasis with vasogenic edema, continue Decadron 4 mg IV every 6 hours.  -Per radiation oncology whole brain radiation started.  ? Healthcare associated pneumonia  -Patient started on vancomycin and Zosyn, and will switch antibiotics to levofloxacin   ? UTI  -Patient has chronic indwelling Foley catheter, urine culture has been obtained.  -  Urine culture showed insignificant colony count for staph aureus (likely contamination).  -Urine turned red, likely secondary to minimal injury from pulling the catheter while she was agitated.  -Heparin discontinued, urine is clear, patient was placed on doxycycline for her MRSA. Discontinued at  discharge.  Non-small cell lung cancer- patient has been followed by oncology as outpatient, radiation oncology consulted for possible radiation to the brain at admission.  Acute on chronic kidney disease  -Baseline creatinine is 1.3, presented with creatinine of 1.9.  -Creatinine at discharge is 1.0, this is not necessarily showing improvement in her renal function rather than loss of muscle mass.  Chronic anemia- secondary to anemia of chronic disease, hemoglobin is 8.5 today. Follow CBC in a.m.   Advanced directives -Patient is DNR/DNI, as mentioned above presented with altered mental status. -Previously improved but quickly worsened back again to lethargic state. -The initial plan was to see if the lethargy improves with radiation and steroids, it did not so palliative medicine consulted. -After discussion with the family patient is full comfort, patient will be discharged to [facility].   Procedures:  None  Consultations:  Medical oncology.  Radiation oncology.  Palliative medicine team  Discharge Exam: Filed Vitals:   03/10/14 2050  BP: 140/60  Pulse: 94  Temp: 97.6 F (36.4 C)  Resp: 16   General: Alert and awake, oriented x3, not in any acute distress. HEENT: anicteric sclera, pupils reactive to light and accommodation, EOMI CVS: S1-S2 clear, no murmur rubs or gallops Chest: clear to auscultation bilaterally, no wheezing, rales or rhonchi Abdomen: soft nontender, nondistended, normal bowel sounds, no organomegaly Extremities: no cyanosis, clubbing or edema noted bilaterally Neuro: Cranial nerves II-XII intact, no focal neurological deficits  Discharge Instructions You were cared for by a hospitalist during your hospital stay. If you have any questions about your discharge medications or the care you received while you were in the hospital after you are discharged, you can call the unit and asked to speak with the hospitalist on call if the  hospitalist that took care of you is not available. Once you are discharged, your primary care physician will handle any further medical issues. Please note that NO REFILLS for any discharge medications will be authorized once you are discharged, as it is imperative that you return to your primary care physician (or establish a relationship with a primary care physician if you do not have one) for your aftercare needs so that they can reassess your need for medications and monitor your lab values.  Discharge Instructions   Diet - low sodium heart healthy    Complete by:  As directed      Increase activity slowly    Complete by:  As directed           Current Discharge Medication List    START taking these medications   Details  bisacodyl (DULCOLAX) 10 MG suppository Place 1 suppository (10 mg total) rectally as needed for moderate constipation. Qty: 12 suppository, Refills: 0    haloperidol (HALDOL) 2 MG tablet Take 1 tablet (2 mg total) by mouth 2 (two) times daily as needed for agitation. Qty: 30 tablet, Refills: 0    LORazepam (LORAZEPAM INTENSOL) 2 MG/ML concentrated solution Take 0.5 mLs (1 mg total) by mouth every 4 (four) hours as needed for anxiety. Qty: 30 mL, Refills: 0    Morphine Sulfate (MORPHINE CONCENTRATE) 10 mg / 0.5 ml concentrated solution Take 0.5 mLs (10 mg total) by mouth every  2 (two) hours as needed for moderate pain or severe pain. Qty: 30 mL, Refills: 0      STOP taking these medications     docusate sodium (COLACE) 100 MG capsule      erlotinib (TARCEVA) 100 MG tablet      gabapentin (NEURONTIN) 100 MG capsule      gentamicin (GARAMYCIN) 0.3 % ophthalmic solution      naproxen sodium (ANAPROX) 220 MG tablet      olmesartan (BENICAR) 20 MG tablet      OxyCODONE (OXYCONTIN) 20 mg T12A 12 hr tablet      oxyCODONE-acetaminophen (PERCOCET/ROXICET) 5-325 MG per tablet      polyethylene glycol (MIRALAX / GLYCOLAX) packet      zolendronic acid  (ZOMETA) 4 MG/5ML injection        Allergies  Allergen Reactions  . Codeine Shortness Of Breath      The results of significant diagnostics from this hospitalization (including imaging, microbiology, ancillary and laboratory) are listed below for reference.    Significant Diagnostic Studies: Mr Brain Wo Contrast  03/03/2014   CLINICAL DATA:  History of lung cancer.  Confusion.  EXAM: MRI HEAD WITHOUT CONTRAST  TECHNIQUE: Multiplanar, multiecho pulse sequences of the brain and surrounding structures were obtained without intravenous contrast. Per technologist no, patient was unable to remain still during the examination, no contrast media administered.  COMPARISON:  CT of the head January 06, 2010  FINDINGS: Moderately motion degraded examination.  Punctate foci of reduced diffusion in RIGHT inferior cerebellum, associated with minimal T2 hyperintense signal though motion limits assessment, concerning for metastasis.  2.5 x 3.7 cm low signal mass in LEFT temporal occipital lobe surrounding T2 bright vasogenic edema. T2 bright RIGHT inferior basal ganglia 10 mm ovoid lesion with surrounding vasogenic edema. 1.5 x 1.6 cm low signal mass and LEFT occipital gray-white matter junction with surrounding T2 bright vasogenic edema. 9 mm round T2 hypointense lesion in RIGHT frontal great white matter junction with small amount of surrounding vasogenic edema. Lesion showed reduced diffusion consistent with hypercellular disease. Punctate focus of susceptibility artifact associated of without high RIGHT frontal and LEFT occipital lobe lesions/metastasis. Local mass effect associated with all lesions, without significant midline shift. Partially effaced LEFT occipital horn, without hydrocephalus.  No abnormal extra-axial fluid collections. Normal major intracranial vascular flow voids seen at the skull base.  Status post bilateral ocular lens implants. Patchy areas of low T1 signal in the calvarium concerning for  metastatic involvement. No abnormal sellar expansion. No cerebellar tonsillar ectopia.  IMPRESSION: Limited noncontrast MRI of the brain, further degraded by patient motion.  At least 4 supratentorial metastasis, largest in LEFT temporal occipital lobe measuring 2.5 x 3.7 cm. Local mass effect without significant midline shift.  Tiny RIGHT inferior cerebellum acute infarct versus metastasis.  Abnormal calvarial bone marrow signal concerning for osseous metastasis.   Electronically Signed   By: Elon Alas   On: 03/03/2014 23:05   Mr Thoracic Spine Wo Contrast  03/04/2014   CLINICAL DATA:  Assess for metastasis.  EXAM: MRI THORACIC AND LUMBAR SPINE WITHOUT CONTRAST  TECHNIQUE: Multiplanar and multiecho pulse sequences of the thoracic and lumbar spine were obtained without intravenous contrast. Patient was unable to remains filled, and contrast was not administered.  COMPARISON:  CT of the chest, abdomen and pelvis November 25, 2013  FINDINGS: MR THORACIC SPINE FINDINGS  Mildly motion degraded examination.  Thoracic vertebral bodies appear intact and aligned and maintenance of thoracic kyphosis. Low T1  and bright T2 lesion in vertebral body T2 suggests a hemangioma. On localizer, low T1 signal within the C5 vertebra. Intervertebral discs demonstrate normal morphology, slightly decreased T2 signal within all discs most consistent with mild desiccation. No STIR signal abnormality with exception of the T2 lesion.  Motion degrades assessment for a cervical spinal cord signal abnormality, though there is no syrinx. Included view of the lungs demonstrates a large RIGHT upper lobe mass and large RIGHT pleural effusion.  No canal stenosis or neural foraminal narrowing at any thoracic level.  MR LUMBAR SPINE FINDINGS  Expansile low T1, bright T2 lesion in L3, extending into the RIGHT pedicle with pathologic severe fracture predominantly on the RIGHT. In addition, low T1 signal with mild bright T2 signal within the LEFT  aspect of L2 consistent with metastasis with small RIGHT L2 vertebral body lesion. Small L1 hemangioma.  No malalignment. Moderate to severe remote L2-3 through all L5-S1 disc degeneration. Conus medullaris terminates at L1 and appears normal in morphology and signal characteristics. Cauda equina is unremarkable. Moderate to severe paraspinal muscle atrophy. Partially characterized retroperitoneal lymphadenopathy. Low T1, bright T2 signal small lesions in the liver could reflect metastasis.  Expansile low T1, bright STIR signal in the included sacrum seen only on sagittal sequences consistent with metastatic disease.  Level by level evaluation:  T12-L1: Annular bulging, no canal stenosis or neural foraminal narrowing.  L1-2 No disc bulge, canal stenosis nor neural foraminal narrowing.  L2-3: Approximate 4 mm retropulsed bony fragments. Mild to moderate canal stenosis. No neural significant neural foraminal narrowing.  L3-4: Minimal retropulsed bony fragments and disc material asymmetric to the LEFT. Mild facet arthropathy and ligamentum flavum redundancy. No canal stenosis. Mild LEFT neural foraminal narrowing.  L4-5: Small broad-based disc bulge there are mild facet arthropathy and ligamentum flavum redundancy without canal stenosis. Minimal RIGHT neural foraminal narrowing.  L5-S1: Small broad-based disc bulge. Moderate to severe facet arthropathy. No canal stenosis. Mild LEFT neural foraminal narrowing.  IMPRESSION: MR THORACIC SPINE IMPRESSION  Limited noncontrast examination further degraded by motion.  No convincing evidence of thoracic spine osseous metastasis ; no pathologic fracture or malalignment. Probable hemangioma T2.  Possible C5 metastasis seen on the localizer.  Large RIGHT upper lobe mass and a large RIGHT pleural effusion partially imaged.  MR LUMBAR SPINE IMPRESSION  Limited noncontrast examination.  Severe L3 pathologic fracture with underlying metastasis. Additional L2 metastasis without  fracture. Partially imaged sacral metastasis.  Mild to moderate canal stenosis at L2-3. Minimal to mild L3-4 through L5-S1 neural foraminal narrowing.   Electronically Signed   By: Elon Alas   On: 03/04/2014 02:56   Mr Lumbar Spine Wo Contrast  03/04/2014   CLINICAL DATA:  Assess for metastasis.  EXAM: MRI THORACIC AND LUMBAR SPINE WITHOUT CONTRAST  TECHNIQUE: Multiplanar and multiecho pulse sequences of the thoracic and lumbar spine were obtained without intravenous contrast. Patient was unable to remains filled, and contrast was not administered.  COMPARISON:  CT of the chest, abdomen and pelvis November 25, 2013  FINDINGS: MR THORACIC SPINE FINDINGS  Mildly motion degraded examination.  Thoracic vertebral bodies appear intact and aligned and maintenance of thoracic kyphosis. Low T1 and bright T2 lesion in vertebral body T2 suggests a hemangioma. On localizer, low T1 signal within the C5 vertebra. Intervertebral discs demonstrate normal morphology, slightly decreased T2 signal within all discs most consistent with mild desiccation. No STIR signal abnormality with exception of the T2 lesion.  Motion degrades assessment for a cervical spinal  cord signal abnormality, though there is no syrinx. Included view of the lungs demonstrates a large RIGHT upper lobe mass and large RIGHT pleural effusion.  No canal stenosis or neural foraminal narrowing at any thoracic level.  MR LUMBAR SPINE FINDINGS  Expansile low T1, bright T2 lesion in L3, extending into the RIGHT pedicle with pathologic severe fracture predominantly on the RIGHT. In addition, low T1 signal with mild bright T2 signal within the LEFT aspect of L2 consistent with metastasis with small RIGHT L2 vertebral body lesion. Small L1 hemangioma.  No malalignment. Moderate to severe remote L2-3 through all L5-S1 disc degeneration. Conus medullaris terminates at L1 and appears normal in morphology and signal characteristics. Cauda equina is unremarkable. Moderate  to severe paraspinal muscle atrophy. Partially characterized retroperitoneal lymphadenopathy. Low T1, bright T2 signal small lesions in the liver could reflect metastasis.  Expansile low T1, bright STIR signal in the included sacrum seen only on sagittal sequences consistent with metastatic disease.  Level by level evaluation:  T12-L1: Annular bulging, no canal stenosis or neural foraminal narrowing.  L1-2 No disc bulge, canal stenosis nor neural foraminal narrowing.  L2-3: Approximate 4 mm retropulsed bony fragments. Mild to moderate canal stenosis. No neural significant neural foraminal narrowing.  L3-4: Minimal retropulsed bony fragments and disc material asymmetric to the LEFT. Mild facet arthropathy and ligamentum flavum redundancy. No canal stenosis. Mild LEFT neural foraminal narrowing.  L4-5: Small broad-based disc bulge there are mild facet arthropathy and ligamentum flavum redundancy without canal stenosis. Minimal RIGHT neural foraminal narrowing.  L5-S1: Small broad-based disc bulge. Moderate to severe facet arthropathy. No canal stenosis. Mild LEFT neural foraminal narrowing.  IMPRESSION: MR THORACIC SPINE IMPRESSION  Limited noncontrast examination further degraded by motion.  No convincing evidence of thoracic spine osseous metastasis ; no pathologic fracture or malalignment. Probable hemangioma T2.  Possible C5 metastasis seen on the localizer.  Large RIGHT upper lobe mass and a large RIGHT pleural effusion partially imaged.  MR LUMBAR SPINE IMPRESSION  Limited noncontrast examination.  Severe L3 pathologic fracture with underlying metastasis. Additional L2 metastasis without fracture. Partially imaged sacral metastasis.  Mild to moderate canal stenosis at L2-3. Minimal to mild L3-4 through L5-S1 neural foraminal narrowing.   Electronically Signed   By: Elon Alas   On: 03/04/2014 02:56   Dg Abd Acute W/chest  03/03/2014   CLINICAL DATA:  Chest and abdominal pain.  EXAM: ACUTE ABDOMEN  SERIES (ABDOMEN 2 VIEW & CHEST 1 VIEW)  COMPARISON:  December 02, 2013.  FINDINGS: Stable cardiac silhouette. Right internal jugular Port-A-Cath is again noted with distal tip in expected position of the SVC. Interval development of moderate right pleural effusion with probable underlying atelectasis. Right upper lobe opacity is noted concerning for atelectasis or pneumonia. No pneumothorax is noted. Left lung is clear. Stool is noted throughout the colon. There is no evidence of bowel obstruction or ileus. Multilevel degenerative disc disease is noted in the lumbar spine.  IMPRESSION: No evidence of bowel obstruction or ileus. Stool is noted throughout the colon which may represent constipation. Interval development of moderate right pleural effusion with probable underlying atelectasis. Increased right upper lobe opacity is noted concerning for pneumonia or atelectasis.   Electronically Signed   By: Sabino Dick M.D.   On: 03/03/2014 13:37   Mr Attempted Daymon Larsen Report  03/06/2014   This examination belongs to an outside facility and is stored  here for comparison purposes only.  Contact the originating outside  institution for any  associated report or interpretation.   Microbiology: Recent Results (from the past 240 hour(s))  URINE CULTURE     Status: None   Collection Time    03/04/14  3:58 PM      Result Value Ref Range Status   Specimen Description URINE, CATHETERIZED   Final   Special Requests NONE   Final   Culture  Setup Time     Final   Value: 03/04/2014 18:56     Performed at [facility]     Final   Value: 10,000 COLONIES/ML     Performed at [facility]   Culture     Final   Value: METHICILLIN RESISTANT STAPHYLOCOCCUS AUREUS     Note: RIFAMPIN AND GENTAMICIN SHOULD NOT BE USED AS SINGLE DRUGS FOR TREATMENT OF STAPH INFECTIONS.     Performed at [facility]   Report Status 03/08/2014 FINAL   Final   Organism ID, Bacteria METHICILLIN RESISTANT  STAPHYLOCOCCUS AUREUS   Final     Labs: Basic Metabolic Panel:  Recent Labs Lab 03/05/14 0428 03/07/14 0455 03/08/14 0425 03/09/14 0530  NA 138 138 137 137  K 4.6 4.2 4.2 4.5  CL 103 102 102 101  CO2 23 24 23 23   GLUCOSE 154* 164* 140* 120*  BUN 43* 38* 35* 36*  CREATININE 1.42* 1.24* 1.07 1.02  CALCIUM 7.6* 7.2* 7.2* 7.2*   Liver Function Tests:  Recent Labs Lab 03/05/14 0428  AST 53*  ALT 32  ALKPHOS 123*  BILITOT 0.5  PROT 5.5*  ALBUMIN 1.8*   No results found for this basename: LIPASE, AMYLASE,  in the last 168 hours No results found for this basename: AMMONIA,  in the last 168 hours CBC:  Recent Labs Lab 03/05/14 0427 03/07/14 0455 03/08/14 0425 03/09/14 0530  WBC 6.7 10.6* 13.8* 15.9*  HGB 8.6* 8.0* 8.5* 8.9*  HCT 26.8* 24.5* 26.2* 26.6*  MCV 96.4 95.7 95.3 92.7  PLT 258 287 275 254   Cardiac Enzymes: No results found for this basename: CKTOTAL, CKMB, CKMBINDEX, TROPONINI,  in the last 168 hours BNP: BNP (last 3 results) No results found for this basename: PROBNP,  in the last 8760 hours CBG: No results found for this basename: GLUCAP,  in the last 168 hours     Signed:  Malley Hauter A  [facility] 03/11/2014, 1:02 PM

## 2014-03-11 NOTE — Progress Notes (Signed)
CSW continuing to follow.  CSW received phone call from PMT NP, Vinie Sill stating that palliative medicine goals of care meeting was held this morning and pt family wants comfort for pt and residential hospice placement.  CSW met with pt nephew at bedside. CSW provided support. CSW offered residential hospice choice. Pt nephew chooses [facility] [place].   CSW contacted [place] and made referral to hospice home.   CSW awaiting [place] to review information and notify CSW of bed availability.  CSW to continue to follow.  Alison Murray, MSW, [place] [facility] [phone]

## 2014-03-11 NOTE — Progress Notes (Signed)
Nutrition Brief Note  Chart reviewed. Pt now transitioning to comfort care.  No further nutrition interventions warranted at this time.  Please re-consult as needed.   Atlee Abide MS RD LDN Clinical Dietitian [phone]

## 2014-03-11 NOTE — Progress Notes (Signed)
Pt for discharge to [facility] [place].  CSW facilitated pt discharge needs including contacting facility, faxing pt discharge information via [facility], discussing with pt nephew, [name] at bedside, providing RN phone number to call report, and arranging ambulance transport via [facility] to [facility] [place].  Pt nephew coping appropriately with pt transition to hospice home. Pt family wishes for pt to be comfortable and recognize that hospice home will be best environment for pt. Pt nephew appreciative of CSW support and assistance.   No further social work needs identified at this time.  CSW signing off.   Alison Murray, MSW, [place] [facility] [phone]

## 2014-03-13 ENCOUNTER — Ambulatory Visit: Payer: Medicare Other | Admitting: Internal Medicine

## 2014-03-13 NOTE — Progress Notes (Signed)
  Radiation Oncology         [phone]) [phone] ________________________________  Name: Jamie Munoz MRN: [id number]  Date: 03/10/2014  DOB: 1926/09/18  End of Treatment Note  Diagnosis:   80 year old woman with multiple brain metastases and symptomatic sacral metastasis.   Indication for treatment:  Palliation       Radiation treatment dates:  03/06/2014-03/10/2014  Site/dose:    1. The whole brain was treated to 9 Gy in 3 fractions of 3 Gy 2. The sacrum was treated to 9 Gy in 3 fractions of 3 Gy  Beams/energy:    1. The whole brain was treated using right and left radiation fields treating the entire intracranial contents while excluding the eyes and face with 6 megavolt photons. A thermoplastic head cast was employed with weekly port films 2. The sacrum was treated 4 fields from the anterior, right lateral, left lateral, and posterior gantry orientation's. 15 megavolt x-rays were used.  Narrative: The patient tolerated radiation treatment relatively well.   However, her overall condition continued to decline during radiation and she switched to a comfort care approach foregoing any further additional treatments.  Plan: The patient has completed radiation treatment. The patient will return to radiation oncology clinic for routine followup on an as needed basis. I advised them to call or return sooner if they have any questions or concerns. ________________________________  Sheral Apley Tammi Klippel, M.D.

## 2014-03-14 ENCOUNTER — Ambulatory Visit: Payer: Medicare Other | Admitting: Internal Medicine

## 2014-03-20 DEATH — deceased

## 2014-03-31 ENCOUNTER — Other Ambulatory Visit: Payer: Self-pay | Admitting: Internal Medicine

## 2014-03-31 DIAGNOSIS — C3490 Malignant neoplasm of unspecified part of unspecified bronchus or lung: Secondary | ICD-10-CM

## 2014-04-01 ENCOUNTER — Encounter (HOSPITAL_COMMUNITY): Payer: Self-pay | Admitting: Certified Registered"

## 2014-04-01 ENCOUNTER — Ambulatory Visit (HOSPITAL_COMMUNITY): Admission: RE | Admit: 2014-04-01 | Payer: Medicare Other | Source: Ambulatory Visit

## 2014-04-01 ENCOUNTER — Encounter (HOSPITAL_COMMUNITY): Admission: RE | Payer: Self-pay | Source: Ambulatory Visit

## 2014-04-01 SURGERY — RADIOLOGY WITH ANESTHESIA
Anesthesia: General

## 2014-04-03 ENCOUNTER — Institutional Professional Consult (permissible substitution): Payer: Medicare Other | Admitting: Radiation Oncology

## 2014-07-31 ENCOUNTER — Other Ambulatory Visit: Payer: Self-pay | Admitting: Pharmacist

## 2014-10-18 IMAGING — CR DG CHEST 1V
1 series · 1 of 1 positions shown · non-contrast
Comparison: 11/25/2013 CT

CLINICAL DATA: 86-year-old female status post right upper lobe mass
biopsy. History of lung cancer.

EXAM:
CHEST - 1 VIEW

[x chest ap]
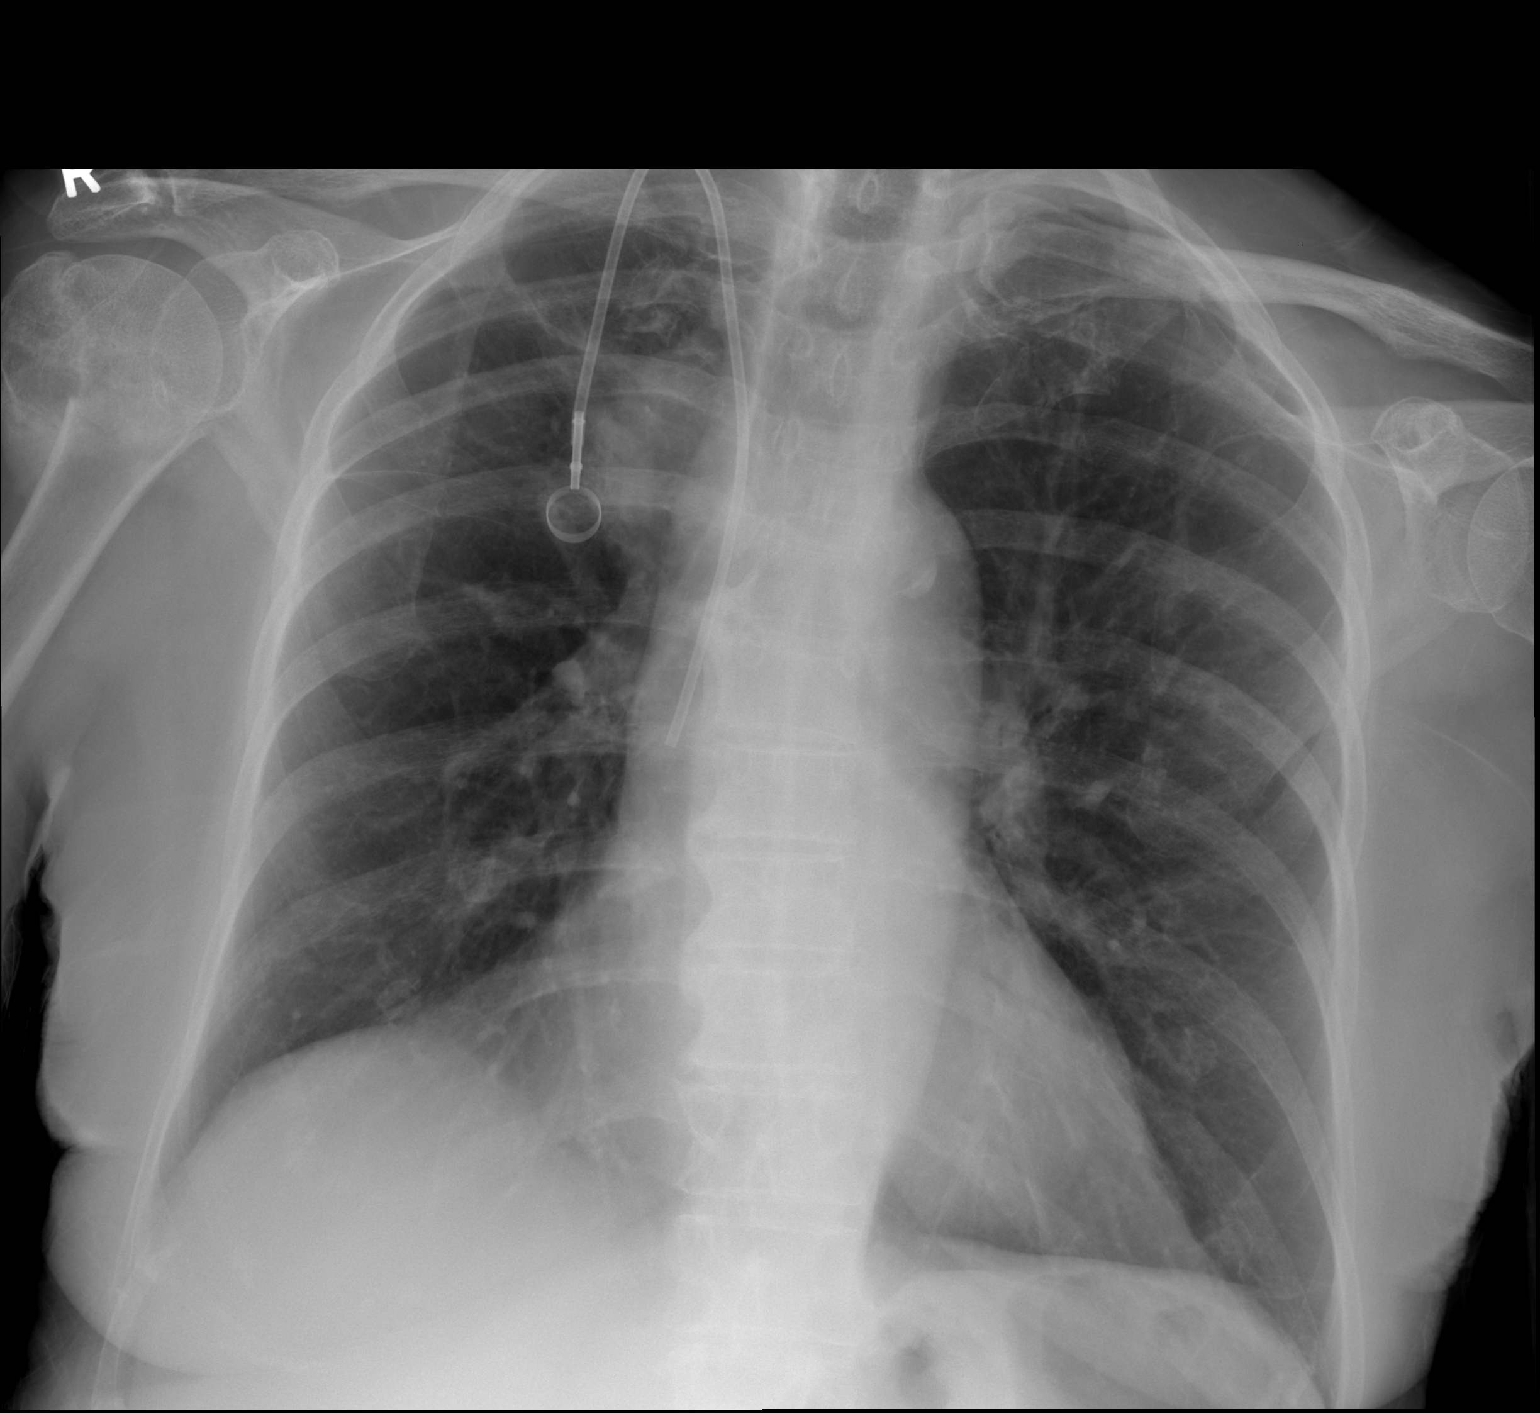

[1 of 1 positions shown; findings below may reference images not displayed]

FINDINGS: The medial right upper lobe mass is again identified.

There is no evidence of pneumothorax following biopsy.

Mild elevation of the right hemidiaphragm and right Port-A-Cath with
tip overlying the mid-lower SVC again noted.

A healing proximal right humeral fracture is again noted.
IMPRESSION: No evidence of pneumothorax or pleural effusion following right
upper lobe mass biopsy.
